# Patient Record
Sex: Male | Born: 1935
Health system: Southern US, Community
[De-identification: ages and names within clinical notes are randomized; demographics above are authoritative.]

## PROBLEM LIST (undated history)

## (undated) DIAGNOSIS — I10 Essential (primary) hypertension: Secondary | ICD-10-CM

## (undated) DIAGNOSIS — I2510 Atherosclerotic heart disease of native coronary artery without angina pectoris: Secondary | ICD-10-CM

## (undated) DIAGNOSIS — I5189 Other ill-defined heart diseases: Secondary | ICD-10-CM

## (undated) DIAGNOSIS — M19041 Primary osteoarthritis, right hand: Secondary | ICD-10-CM

## (undated) DIAGNOSIS — Z87898 Personal history of other specified conditions: Secondary | ICD-10-CM

## (undated) DIAGNOSIS — I495 Sick sinus syndrome: Secondary | ICD-10-CM

## (undated) DIAGNOSIS — Z972 Presence of dental prosthetic device (complete) (partial): Secondary | ICD-10-CM

## (undated) DIAGNOSIS — G589 Mononeuropathy, unspecified: Secondary | ICD-10-CM

## (undated) DIAGNOSIS — E785 Hyperlipidemia, unspecified: Secondary | ICD-10-CM

## (undated) DIAGNOSIS — J189 Pneumonia, unspecified organism: Secondary | ICD-10-CM

## (undated) DIAGNOSIS — R0609 Other forms of dyspnea: Secondary | ICD-10-CM

## (undated) DIAGNOSIS — I200 Unstable angina: Secondary | ICD-10-CM

## (undated) DIAGNOSIS — D649 Anemia, unspecified: Secondary | ICD-10-CM

## (undated) DIAGNOSIS — K219 Gastro-esophageal reflux disease without esophagitis: Secondary | ICD-10-CM

## (undated) DIAGNOSIS — R200 Anesthesia of skin: Secondary | ICD-10-CM

## (undated) DIAGNOSIS — Z79899 Other long term (current) drug therapy: Secondary | ICD-10-CM

## (undated) DIAGNOSIS — N400 Enlarged prostate without lower urinary tract symptoms: Secondary | ICD-10-CM

## (undated) DIAGNOSIS — N471 Phimosis: Secondary | ICD-10-CM

## (undated) DIAGNOSIS — R42 Dizziness and giddiness: Secondary | ICD-10-CM

## (undated) DIAGNOSIS — R202 Paresthesia of skin: Secondary | ICD-10-CM

## (undated) DIAGNOSIS — I700 Atherosclerosis of aorta: Secondary | ICD-10-CM

## (undated) DIAGNOSIS — R0989 Other specified symptoms and signs involving the circulatory and respiratory systems: Secondary | ICD-10-CM

## (undated) DIAGNOSIS — N189 Chronic kidney disease, unspecified: Secondary | ICD-10-CM

## (undated) DIAGNOSIS — M1990 Unspecified osteoarthritis, unspecified site: Secondary | ICD-10-CM

## (undated) DIAGNOSIS — I4891 Unspecified atrial fibrillation: Secondary | ICD-10-CM

## (undated) DIAGNOSIS — I209 Angina pectoris, unspecified: Secondary | ICD-10-CM

## (undated) DIAGNOSIS — N183 Chronic kidney disease, stage 3 unspecified: Secondary | ICD-10-CM

## (undated) HISTORY — DX: Unstable angina: I20.0

## (undated) HISTORY — DX: Personal history of other specified conditions: Z87.898

## (undated) HISTORY — PX: COLONOSCOPY: SHX174

## (undated) HISTORY — DX: Other specified symptoms and signs involving the circulatory and respiratory systems: R06.09

## (undated) HISTORY — PX: TONSILLECTOMY: SUR1361

## (undated) HISTORY — DX: Hyperlipidemia, unspecified: E78.5

## (undated) HISTORY — PX: HERNIA REPAIR: SHX51

## (undated) HISTORY — DX: Dizziness and giddiness: R42

## (undated) HISTORY — PX: CARDIAC CATHETERIZATION: SHX172

## (undated) HISTORY — DX: Atherosclerotic heart disease of native coronary artery without angina pectoris: I25.10

## (undated) HISTORY — DX: Essential (primary) hypertension: I10

## (undated) HISTORY — DX: Anesthesia of skin: R20.2

## (undated) HISTORY — DX: Benign prostatic hyperplasia without lower urinary tract symptoms: N40.0

## (undated) HISTORY — DX: Anesthesia of skin: R20.0

## (undated) HISTORY — DX: Other specified symptoms and signs involving the circulatory and respiratory systems: R09.89

---

## 2002-12-05 ENCOUNTER — Ambulatory Visit (HOSPITAL_COMMUNITY): Admission: RE | Admit: 2002-12-05 | Discharge: 2002-12-05 | Payer: Self-pay | Admitting: Cardiology

## 2002-12-05 HISTORY — PX: LEFT HEART CATH AND CORONARY ANGIOGRAPHY: CATH118249

## 2004-11-24 ENCOUNTER — Ambulatory Visit: Payer: Self-pay | Admitting: Cardiology

## 2005-08-14 ENCOUNTER — Ambulatory Visit: Payer: Self-pay | Admitting: Gastroenterology

## 2005-10-06 ENCOUNTER — Ambulatory Visit: Payer: Self-pay | Admitting: Gastroenterology

## 2006-02-05 ENCOUNTER — Ambulatory Visit: Payer: Self-pay | Admitting: Cardiology

## 2006-10-05 ENCOUNTER — Ambulatory Visit: Payer: Self-pay | Admitting: Cardiology

## 2006-11-01 ENCOUNTER — Ambulatory Visit: Payer: Self-pay

## 2007-08-12 ENCOUNTER — Ambulatory Visit: Payer: Self-pay | Admitting: Cardiology

## 2008-05-13 ENCOUNTER — Inpatient Hospital Stay: Payer: Self-pay | Admitting: Internal Medicine

## 2008-05-13 ENCOUNTER — Ambulatory Visit: Payer: Self-pay | Admitting: Cardiovascular Disease

## 2008-05-15 ENCOUNTER — Inpatient Hospital Stay (HOSPITAL_COMMUNITY): Admission: EM | Admit: 2008-05-15 | Discharge: 2008-05-16 | Payer: Self-pay | Admitting: Cardiovascular Disease

## 2008-05-15 ENCOUNTER — Ambulatory Visit: Payer: Self-pay | Admitting: Cardiovascular Disease

## 2008-05-15 HISTORY — PX: CORONARY ANGIOPLASTY WITH STENT PLACEMENT: SHX49

## 2008-05-23 ENCOUNTER — Ambulatory Visit: Payer: Self-pay | Admitting: Cardiology

## 2008-06-13 ENCOUNTER — Encounter: Payer: Self-pay | Admitting: Cardiology

## 2008-06-21 ENCOUNTER — Ambulatory Visit: Payer: Self-pay | Admitting: Internal Medicine

## 2008-06-21 ENCOUNTER — Inpatient Hospital Stay: Payer: Self-pay | Admitting: Internal Medicine

## 2008-07-04 ENCOUNTER — Ambulatory Visit: Payer: Self-pay | Admitting: Cardiology

## 2008-07-04 ENCOUNTER — Inpatient Hospital Stay: Payer: Self-pay | Admitting: Internal Medicine

## 2008-07-13 ENCOUNTER — Encounter: Payer: PRIVATE HEALTH INSURANCE | Admitting: Cardiology

## 2008-08-13 ENCOUNTER — Encounter: Payer: PRIVATE HEALTH INSURANCE | Admitting: Cardiology

## 2008-09-10 ENCOUNTER — Encounter: Payer: PRIVATE HEALTH INSURANCE | Admitting: Cardiology

## 2008-09-19 ENCOUNTER — Ambulatory Visit: Payer: Self-pay | Admitting: Cardiology

## 2008-10-11 ENCOUNTER — Encounter: Payer: PRIVATE HEALTH INSURANCE | Admitting: Cardiology

## 2008-12-15 DIAGNOSIS — E785 Hyperlipidemia, unspecified: Secondary | ICD-10-CM | POA: Insufficient documentation

## 2008-12-15 DIAGNOSIS — Z87898 Personal history of other specified conditions: Secondary | ICD-10-CM

## 2008-12-21 ENCOUNTER — Ambulatory Visit: Payer: Self-pay | Admitting: Cardiology

## 2008-12-21 DIAGNOSIS — R0989 Other specified symptoms and signs involving the circulatory and respiratory systems: Secondary | ICD-10-CM

## 2008-12-21 DIAGNOSIS — R0609 Other forms of dyspnea: Secondary | ICD-10-CM | POA: Insufficient documentation

## 2008-12-26 ENCOUNTER — Telehealth (INDEPENDENT_AMBULATORY_CARE_PROVIDER_SITE_OTHER): Payer: Self-pay | Admitting: *Deleted

## 2008-12-27 ENCOUNTER — Ambulatory Visit: Payer: Self-pay

## 2008-12-27 ENCOUNTER — Encounter: Payer: Self-pay | Admitting: Cardiology

## 2009-06-19 ENCOUNTER — Telehealth: Payer: Self-pay | Admitting: Cardiology

## 2009-07-03 DIAGNOSIS — I2510 Atherosclerotic heart disease of native coronary artery without angina pectoris: Secondary | ICD-10-CM | POA: Insufficient documentation

## 2009-07-09 ENCOUNTER — Ambulatory Visit: Payer: Self-pay | Admitting: Cardiology

## 2009-10-02 ENCOUNTER — Ambulatory Visit: Payer: Self-pay | Admitting: Cardiology

## 2009-11-04 ENCOUNTER — Telehealth: Payer: Self-pay | Admitting: Cardiology

## 2010-04-08 ENCOUNTER — Ambulatory Visit: Payer: Self-pay | Admitting: Cardiology

## 2010-05-22 ENCOUNTER — Encounter: Payer: Self-pay | Admitting: Cardiology

## 2010-05-22 ENCOUNTER — Telehealth: Payer: Self-pay | Admitting: Cardiology

## 2010-05-28 ENCOUNTER — Ambulatory Visit: Payer: PRIVATE HEALTH INSURANCE | Admitting: Surgery

## 2010-05-29 ENCOUNTER — Ambulatory Visit: Payer: PRIVATE HEALTH INSURANCE | Admitting: Surgery

## 2010-05-30 LAB — PATHOLOGY REPORT

## 2010-06-10 ENCOUNTER — Ambulatory Visit: Payer: Self-pay | Admitting: Cardiovascular Disease

## 2010-07-28 ENCOUNTER — Encounter: Payer: Self-pay | Admitting: Cardiology

## 2010-07-28 ENCOUNTER — Ambulatory Visit
Admission: RE | Admit: 2010-07-28 | Discharge: 2010-07-28 | Payer: Self-pay | Source: Home / Self Care | Attending: Cardiology | Admitting: Cardiology

## 2010-08-12 NOTE — Assessment & Plan Note (Signed)
Summary: f1y/jss  Medications Added PANTOPRAZOLE SODIUM 20 MG TBEC (PANTOPRAZOLE SODIUM) 1 tab once daily      Allergies Added: NKDA  Visit Type:  4 mo f/u Primary Provider:  Dr Randa Lynn  CC:  no cardiac complaints today.  History of Present Illness: [name] comes in today for evaluation and management his coronary disease.  He's having no symptoms of angina or ischemia. He remains extremely active.  His blood work is being followed by Dr. Randa Lynn.  His last objective assessment of his coronary disease was last summer. He had good exercise tolerance with no sign of ischemia.  When he presented, he had symptoms with exertion. We have reviewed those today.  Current Medications (verified): 1)  Lipitor 20 Mg Tabs (Atorvastatin Calcium) .Marland Kitchen.. 1 Tab Once Daily 2)  Niaspan 500 Mg Cr-Tabs (Niacin (Antihyperlipidemic)) .Marland Kitchen.. 1 Tab Once Daily 3)  Uroxatral 10 Mg Xr24h-Tab (Alfuzosin Hcl) .Marland Kitchen.. 1 Tab Once Daily 4)  Diltiazem Hcl Er Beads 180 Mg Xr24h-Cap (Diltiazem Hcl Er Beads) .Marland Kitchen.. 1 Cap Once Daily 5)  Plavix 75 Mg Tabs (Clopidogrel Bisulfate) .Marland Kitchen.. 1 Tab Once Daily 6)  Aspirin 81 Mg Tbec (Aspirin) .... Take One Tablet By Mouth Daily 7)  Pantoprazole Sodium 20 Mg Tbec (Pantoprazole Sodium) .Marland Kitchen.. 1 Tab Once Daily 8)  Metoprolol Succinate 25 Mg Xr24h-Tab (Metoprolol Succinate) .Marland Kitchen.. 1 Tab Once Daily 9)  Nitroglycerin 0.4 Mg Subl (Nitroglycerin) .... One Tablet Under Tongue Every 5 Minutes As Needed For Chest Pain---May Repeat Times Three 10)  Iron Supp .Marland Kitchen.. 1 Tab Once Daily  Allergies (verified): No Known Drug Allergies  Past History:  Past Medical History: Last updated: 07/03/2009 CAD, NATIVE VESSEL (ICD-414.01) DYSPNEA ON EXERTION (ICD-786.09) ANGINA, UNSTABLE (ICD-411.1) DYSLIPIDEMIA (ICD-272.4) BENIGN PROSTATIC HYPERTROPHY, HX OF (ICD-V13.8)    Past Surgical History: Last updated: 12/15/2008 no surgeries  Family History: Last updated: 12/15/2008 Family History of CVA or Stroke:  Mother deceased at 78 stroke Father: deceased at 27 due to accident  Social History: Last updated: 12/15/2008 Retired 12/95 Married  Tobacco Use - No.  Alcohol Use - yes.Marland Kitchenoccasional Regular Exercise - yes Drug Use - no  Risk Factors: Exercise: yes (12/15/2008)  Risk Factors: Smoking Status: never (12/15/2008)  Review of Systems       negative other than history of present illness  Vital Signs:  Patient profile:   77 year old male Height:      72 inches Weight:      197 pounds BMI:     26.81 Pulse rate:   53 / minute Pulse rhythm:   irregular BP sitting:   130 / 80  (left arm) Cuff size:   large  Vitals Entered By: Danielle Rankin, CMA (October 02, 2009 11:45 AM)  Physical Exam  General:  Well developed, well nourished, in no acute distress. Head:  normocephalic and atraumatic Eyes:  PERRLA/EOM intact; conjunctiva and lids normal. Neck:  Neck supple, no JVD. No masses, thyromegaly or abnormal cervical nodes. Chest Jacson Rapaport:  no deformities or breast masses noted Lungs:  Clear bilaterally to auscultation and percussion. Heart:  Non-displaced PMI, chest non-tender; regular rate and rhythm, S1, S2 without murmurs, rubs or gallops. Carotid upstroke normal, no bruit. Normal abdominal aortic size, no bruits. Femorals normal pulses, no bruits. Pedals normal pulses. No edema, no varicosities. Abdomen:  Bowel sounds positive; abdomen soft and non-tender without masses, organomegaly, or hernias noted. No hepatosplenomegaly. Msk:  Back normal, normal gait. Muscle strength and tone normal. Pulses:  pulses normal in all  4 extremities Extremities:  No clubbing or cyanosis. Neurologic:  Alert and oriented x 3. Skin:  Intact without lesions or rashes. Psych:  Normal affect.   EKG  Procedure date:  10/02/2009  Findings:      sinus bradycardia, nonspecific widening of the QRS, no change  Impression & Recommendations:  Problem # 1:  CAD, NATIVE VESSEL (ICD-414.01) Assessment  Unchanged  He is doing remarkably well. I will see him back in 6 months. No change in medication. Symptoms of obstructive coronary disease reviewed. His updated medication list for this problem includes:    Diltiazem Hcl Er Beads 180 Mg Xr24h-cap (Diltiazem hcl er beads) .Marland Kitchen... 1 cap once daily    Plavix 75 Mg Tabs (Clopidogrel bisulfate) .Marland Kitchen... 1 tab once daily    Aspirin 81 Mg Tbec (Aspirin) .Marland Kitchen... Take one tablet by mouth daily    Metoprolol Succinate 25 Mg Xr24h-tab (Metoprolol succinate) .Marland Kitchen... 1 tab once daily    Nitroglycerin 0.4 Mg Subl (Nitroglycerin) ..... One tablet under tongue every 5 minutes as needed for chest pain---may repeat times three  Orders: EKG w/ Interpretation (93000)  Problem # 2:  DYSLIPIDEMIA (ICD-272.4) this That is being followed by his primary care. His updated medication list for this problem includes:    Lipitor 20 Mg Tabs (Atorvastatin calcium) .Marland Kitchen... 1 tab once daily    Niaspan 500 Mg Cr-tabs (Niacin (antihyperlipidemic)) .Marland Kitchen... 1 tab once daily  Patient Instructions: 1)  Your physician recommends that you schedule a follow-up appointment in: 6 MONTHS WITH DR Jaxn Chiquito 2)  Your physician recommends that you continue on your current medications as directed. Please refer to the Current Medication list given to you today.

## 2010-08-12 NOTE — Progress Notes (Signed)
Summary: pt wants to talk to MD about wife   Phone Note Call from Patient Call back at [phone]   Caller: Patient Reason for Call: Talk to Nurse, Talk to Doctor Summary of Call: pt wants to talk to Benjamin Brewer about his wife. wouldn't give anymore information Initial call taken by: [name],  November 04, 2009 11:01 AM  Follow-up for Phone Call        Phone Call Completed PT SEES DR [name] PT'S WIFE  C/O FATIGUE AND HEART BEATING FAST.WOULD LIKE YOU TO REFER OR WOULD YOU SEE WIFE'S NAME [name] Cory DOB[birth year]/20/39. Follow-up by: [name], LPN,  November 04, 2009 2:18 PM  Additional Follow-up for Phone Call Additional follow up Details #1::        SPOKE WITH PT WIFE HAD LABS DONE YESTERDAY WILL SPEAK TO WIFE TO SEE WHAT ROUTE SHE WISHES TO PERSUE  DR [name] STATED HE WOULD SEE HER WILL CLL BACK LATER. Additional Follow-up by: [name], LPN,  November 05, 2009 8:45 AM    Additional Follow-up for Phone Call Additional follow up Details #2::    I am happy to see this week. Follow-up by: Benjamin Shih, MD, [facility],  November 05, 2009 9:52 AM   Appended Document: pt wants to talk to MD about wife SPOKE WITH HUSBAND WIFE WAS SEEN BY PMD YESTERDAY LABS DONE THYROID INFLAMED PER HUSBAND WILL ADDRESS IF CONT TO HAVE PROBLEMS WILL CALL AND SCHEDULE APPT WITH DR [name]./CY

## 2010-08-12 NOTE — Assessment & Plan Note (Signed)
Summary: 6 mo f/u ./cy  Medications Added NITROGLYCERIN 0.4 MG SUBL (NITROGLYCERIN) One tablet under tongue every 5 minutes as needed for chest pain---may repeat times three      Allergies Added: NKDA  Visit Type:  6 mo f/u Primary [name]:  Dr Randa Lynn  CC:  edema/ankles....no other compalaints today.  History of Present Illness: [name] returns today for evaluation and management of his coronary disease. Is no angina or ischemic symptoms. He is very compliant with his medications. Laboratory data followed by his primary care.  Stress Myoview a year ago showed normal left ventricular function with no ischemia or scar. He had good exercise tolerance.  Current Medications (verified): 1)  Lipitor 20 Mg Tabs (Atorvastatin Calcium) .Marland Kitchen.. 1 Tab Once Daily 2)  Niaspan 500 Mg Cr-Tabs (Niacin (Antihyperlipidemic)) .Marland Kitchen.. 1 Tab Once Daily 3)  Uroxatral 10 Mg Xr24h-Tab (Alfuzosin Hcl) .Marland Kitchen.. 1 Tab Once Daily 4)  Diltiazem Hcl Er Beads 180 Mg Xr24h-Cap (Diltiazem Hcl Er Beads) .Marland Kitchen.. 1 Cap Once Daily 5)  Plavix 75 Mg Tabs (Clopidogrel Bisulfate) .Marland Kitchen.. 1 Tab Once Daily 6)  Aspirin 81 Mg Tbec (Aspirin) .... Take One Tablet By Mouth Daily 7)  Pantoprazole Sodium 20 Mg Tbec (Pantoprazole Sodium) .Marland Kitchen.. 1 Tab Once Daily 8)  Metoprolol Succinate 25 Mg Xr24h-Tab (Metoprolol Succinate) .Marland Kitchen.. 1 Tab Once Daily 9)  Nitroglycerin 0.4 Mg Subl (Nitroglycerin) .... One Tablet Under Tongue Every 5 Minutes As Needed For Chest Pain---May Repeat Times Three 10)  Iron Supp .Marland Kitchen.. 1 Tab Once Daily  Allergies (verified): No Known Drug Allergies  Past History:  Past Medical History: Last updated: 07/03/2009 CAD, NATIVE VESSEL (ICD-414.01) DYSPNEA ON EXERTION (ICD-786.09) ANGINA, UNSTABLE (ICD-411.1) DYSLIPIDEMIA (ICD-272.4) BENIGN PROSTATIC HYPERTROPHY, HX OF (ICD-V13.8)    Past Surgical History: Last updated: 12/15/2008 no surgeries  Family History: Last updated: 12/15/2008 Family History of CVA or Stroke:  Mother deceased at 78 stroke Father: deceased at 7 due to accident  Social History: Last updated: 12/15/2008 Retired 12/95 Married  Tobacco Use - No.  Alcohol Use - yes.Marland Kitchenoccasional Regular Exercise - yes Drug Use - no  Risk Factors: Exercise: yes (12/15/2008)  Risk Factors: Smoking Status: never (12/15/2008)  Review of Systems       negative other than history of present illness  Vital Signs:  Patient profile:   77 year old male Height:      72 inches Weight:      195 pounds BMI:     26.54 Pulse rate:   60 / minute Pulse rhythm:   irregular BP sitting:   128 / 80  (left arm) Cuff size:   large  Vitals Entered By: Danielle Rankin, CMA (April 08, 2010 11:35 AM)  Physical Exam  General:  Well developed, well nourished, in no acute distress. Head:  normocephalic and atraumatic Eyes:  PERRLA/EOM intact; conjunctiva and lids normal. Neck:  Neck supple, no JVD. No masses, thyromegaly or abnormal cervical nodes. Chest Wall:  no deformities or breast masses noted Lungs:  Clear bilaterally to auscultation and percussion. Heart:  PMI nondisplaced, regular rate and rhythm, normal S1-S2, no gallop rub or murmur. Carotids equal bilaterally without bruit Abdomen:  Bowel sounds positive; abdomen soft and non-tender without masses, organomegaly, or hernias noted. No hepatosplenomegaly. Msk:  Back normal, normal gait. Muscle strength and tone normal. Pulses:  pulses normal in all 4 extremities Extremities:  No clubbing or cyanosis. Neurologic:  Alert and oriented x 3. Skin:  Intact without lesions or rashes. Psych:  Normal affect.  Impression & Recommendations:  Problem # 1:  CAD, NATIVE VESSEL (ICD-414.01) Assessment Unchanged  His updated medication list for this problem includes:    Diltiazem Hcl Er Beads 180 Mg Xr24h-cap (Diltiazem hcl er beads) .Marland Kitchen... 1 cap once daily    Plavix 75 Mg Tabs (Clopidogrel bisulfate) .Marland Kitchen... 1 tab once daily    Aspirin 81 Mg Tbec (Aspirin)  .Marland Kitchen... Take one tablet by mouth daily    Metoprolol Succinate 25 Mg Xr24h-tab (Metoprolol succinate) .Marland Kitchen... 1 tab once daily    Nitroglycerin 0.4 Mg Subl (Nitroglycerin) ..... One tablet under tongue every 5 minutes as needed for chest pain---may repeat times three  Problem # 2:  DYSLIPIDEMIA (ICD-272.4) followed by primary care His updated medication list for this problem includes:    Lipitor 20 Mg Tabs (Atorvastatin calcium) .Marland Kitchen... 1 tab once daily    Niaspan 500 Mg Cr-tabs (Niacin (antihyperlipidemic)) .Marland Kitchen... 1 tab once daily  Clinical Reports Reviewed:  Cardiac Cath:  05/15/2008: Cardiac Cath Findings:   IMPRESSION:   1. Double-vessel coronary artery disease status post successful       percutaneous coronary intervention with placement of a PROMUS drug-       eluting stent in the proximal right coronary artery and placement       of a PROMUS drug-eluting stent in the mid left anterior descending.   2. Normal left ventricular systolic function.      RECOMMENDATIONS:  I recommend aspirin and Plavix for at least 1 year.   The patient will also be started on a beta-blocker.  I will continue his   statin therapy.  If the patient has problems of bradycardia in the   future, I would may be consider stopping his diltiazem which has been a   part of his medication regimen in the past for episodes of   tachyarrhythmias.  We can most likely manage his tachyarrhythmias with   increased doses of the beta-blocker, which would be appropriate for his   coronary artery disease.  We will admit him to a telemetry bed tonight,   and I will plan on discharge tomorrow if the patient ambulates well and   has no new problems that arise.             [name], MD   Electronically Signed         12/04/2002: Cardiac Cath Findings:   IMPRESSION/RECOMMENDATIONS:  The patient has stable coronary disease  compared with the report of catheterization in 1999.  He has a 60% stenosis  of the left  anterior descending and a 90% stenosis of a 2 mm diagonal  branch.  While the diagonal is amenable to percutaneous therapy, this would  be balloon angioplasty relatively high restenosis rate.  Therefore, favor  continued medical therapy.  Will add Imdur in attempt to further maximize  his exercise tolerance.   The patient has fibromuscular dysplasia of the left renal artery.  Balloon  angioplasty of this lesion for control of his hypertension should be  considered.                                             Salvadore Farber, M.D.  Nuclear Study:  12/27/2008:  Excerise capacity: Good exercise capacity  Blood Pressure response: Hypertensive blood pressure response  Clinical symptoms: No chest pain  ECG impression: No significant ST segment change suggestive  of ischemia  Overall impression: Normal perfusion. No evidence for signifcant ischemia or scar.  Sherrill Raring, MD, [facility]   12/27/2008:  Excerise capacity: Good exercise capacity  Blood Pressure response: Hypertensive blood pressure response  Clinical symptoms: No chest pain  ECG impression: No significant ST segment change suggestive of ischemia  Overall impression: Normal perfusion. No evidence for significant ischemia or scar.  Dietrich Pates, MD, [facility]   11/01/2006:  Excerise capacity: Fair exercise capacity  Blood Pressure response: Hypertensive blood pressure response  Clinical symptoms: No chest pain or dyspnea  ECG impression: Inferolateral J point elevation with poor wave progression. No ischemia  Overall impression: Normal stress nuclear study.  Noralyn Pick. Eden Emms, MD     01/02/2004:  Final Interpretation: Normal stress Cardiolite with no chest pain and no diagnostic electrocardiographic changes. There was a hypertensive response to exercise. The sctigraphic results show mild apical thinning, but there was no ischemia on this study. The gated ejection fraction was 57%, and the wall motion was  normal.  Madolyn Frieze. Jens Som, MD, [facility]  08/06/2003[facility] STRESS ECHOCARDIOGRAM Results: Baseline EKG shows normal sinus rhythm with a right bundle branch block configuration. Treadmill stress test was clinically negative. Electrocardiographically he did develop his usual 2 mm. at least of ST depresion inferiorly and laterally, which promptly resolved postexercise. These are the same ST changes that he has had on prior stress testing. Subsequent echocardiographic readings by D.r Bethann Punches were completely within normal limits, with no evidence of ischemia.  Impression: Normal stress echo as noted above.  Reola Mosher. Randa Lynn, MD, FACP   Patient Instructions: 1)  Your physician recommends that you schedule a follow-up appointment in: 6 months 2)  Your physician recommends that you continue on your current medications as directed. Please refer to the Current Medication list given to you today. Prescriptions: NITROGLYCERIN 0.4 MG SUBL (NITROGLYCERIN) One tablet under tongue every 5 minutes as needed for chest pain---may repeat times three  #25 x 9   Entered by:   Danielle Rankin, CMA   Authorized by:   Gaylord Shih, MD, [facility]   Signed by:   Danielle Rankin, CMA on 04/08/2010   Method used:   Electronically to        [facility]. [address] [phone]* (retail)       [address] [place], [place]  [id number]       Ph: [phone]       Fax: [phone]   RxID:   [id number]

## 2010-08-12 NOTE — Consult Note (Signed)
Summary: [facility] Surgery Consult   [facility] Surgery Consult   Imported By: Roderic Ovens 06/03/2010 14:45:46  _____________________________________________________________________  External Attachment:    Type:   Image     Comment:   External Document  Appended Document: [facility] Surgery Consult   Reviewed Juanito Doom, MD

## 2010-08-12 NOTE — Progress Notes (Signed)
Summary: pt need to stop asa-    Phone Note From Other Clinic   Caller: [facility] [phone]- fax [phone] Request: Talk with Nurse Summary of Call: pt need to stop plavix & asa for right hernia repair. Initial call taken by: Lorne Skeens,  May 22, 2010 3:24 PM  Follow-up for Phone Call        ok, restart as soon as possible. Follow-up by: Gaylord Shih, MD, [facility],  May 22, 2010 5:37 PM

## 2010-08-14 NOTE — Assessment & Plan Note (Signed)
Summary: f1y  Medications Added FERRO-BOB 325 (65 FE) MG TABS (FERROUS SULFATE) 1 tab once daily ICAPS MV  TABS (MULTIPLE VITAMINS-MINERALS) 1 tab once daily      Allergies Added: NKDA  Visit Type:  1 yr f/u Primary Provider:  Dr Randa Lynn  CC:  chest tightness....denies any other complaints today.  History of Present Illness: [name] returns today for evaluation and management of his coronary artery disease.  At 77 years of age, he continues to work out 5 days a week. He is having no symptoms of angina or ischemia. He does complain of some generalized fatigue but no presyncope or syncope.  Medications reviewed. He is quite compliant. Last stress test was unremarkable with excellent exercise tolerance. He has had no further GI bleeding.  Clinical Reports Reviewed:  Cardiac Cath:  05/15/2008: Cardiac Cath Findings:   IMPRESSION:   1. Double-vessel coronary artery disease status post successful       percutaneous coronary intervention with placement of a PROMUS drug-       eluting stent in the proximal right coronary artery and placement       of a PROMUS drug-eluting stent in the mid left anterior descending.   2. Normal left ventricular systolic function.      RECOMMENDATIONS:  I recommend aspirin and Plavix for at least 1 year.   The patient will also be started on a beta-blocker.  I will continue his   statin therapy.  If the patient has problems of bradycardia in the   future, I would may be consider stopping his diltiazem which has been a   part of his medication regimen in the past for episodes of   tachyarrhythmias.  We can most likely manage his tachyarrhythmias with   increased doses of the beta-blocker, which would be appropriate for his   coronary artery disease.  We will admit him to a telemetry bed tonight,   and I will plan on discharge tomorrow if the patient ambulates well and   has no new problems that arise.             [name], MD   Electronically Signed         12/04/2002: Cardiac Cath Findings:   IMPRESSION/RECOMMENDATIONS:  The patient has stable coronary disease  compared with the report of catheterization in 1999.  He has a 60% stenosis  of the left anterior descending and a 90% stenosis of a 2 mm diagonal  branch.  While the diagonal is amenable to percutaneous therapy, this would  be balloon angioplasty relatively high restenosis rate.  Therefore, favor  continued medical therapy.  Will add Imdur in attempt to further maximize  his exercise tolerance.   The patient has fibromuscular dysplasia of the left renal artery.  Balloon  angioplasty of this lesion for control of his hypertension should be  considered.                                             Salvadore Farber, M.D.  Nuclear Study:  12/27/2008:  Excerise capacity: Good exercise capacity  Blood Pressure response: Hypertensive blood pressure response  Clinical symptoms: No chest pain  ECG impression: No significant ST segment change suggestive of ischemia  Overall impression: Normal perfusion. No evidence for signifcant ischemia or scar.  Sherrill Raring, MD, [facility]   12/27/2008:  Excerise capacity:  Good exercise capacity  Blood Pressure response: Hypertensive blood pressure response  Clinical symptoms: No chest pain  ECG impression: No significant ST segment change suggestive of ischemia  Overall impression: Normal perfusion. No evidence for significant ischemia or scar.  Dietrich Pates, MD, [facility]   11/01/2006:  Excerise capacity: Fair exercise capacity  Blood Pressure response: Hypertensive blood pressure response  Clinical symptoms: No chest pain or dyspnea  ECG impression: Inferolateral J point elevation with poor wave progression. No ischemia  Overall impression: Normal stress nuclear study.  Noralyn Pick. Eden Emms, MD     01/02/2004:  Final Interpretation: Normal stress Cardiolite with no chest pain and no diagnostic  electrocardiographic changes. There was a hypertensive response to exercise. The sctigraphic results show mild apical thinning, but there was no ischemia on this study. The gated ejection fraction was 57%, and the Kaybree Williams motion was normal.  Madolyn Frieze. Jens Som, MD, [facility]  08/06/2003[facility] STRESS ECHOCARDIOGRAM Results: Baseline EKG shows normal sinus rhythm with a right bundle branch block configuration. Treadmill stress test was clinically negative. Electrocardiographically he did develop his usual 2 mm. at least of ST depresion inferiorly and laterally, which promptly resolved postexercise. These are the same ST changes that he has had on prior stress testing. Subsequent echocardiographic readings by D.r Bethann Punches were completely within normal limits, with no evidence of ischemia.  Impression: Normal stress echo as noted above.  Reola Mosher. Randa Lynn, MD, FACP   Current Medications (verified): 1)  Lipitor 20 Mg Tabs (Atorvastatin Calcium) .Marland Kitchen.. 1 Tab Once Daily 2)  Niaspan 500 Mg Cr-Tabs (Niacin (Antihyperlipidemic)) .Marland Kitchen.. 1 Tab Once Daily 3)  Uroxatral 10 Mg Xr24h-Tab (Alfuzosin Hcl) .Marland Kitchen.. 1 Tab Once Daily 4)  Diltiazem Hcl Er Beads 180 Mg Xr24h-Cap (Diltiazem Hcl Er Beads) .Marland Kitchen.. 1 Cap Once Daily 5)  Plavix 75 Mg Tabs (Clopidogrel Bisulfate) .Marland Kitchen.. 1 Tab Once Daily 6)  Aspirin 81 Mg Tbec (Aspirin) .... Take One Tablet By Mouth Daily 7)  Pantoprazole Sodium 20 Mg Tbec (Pantoprazole Sodium) .Marland Kitchen.. 1 Tab Once Daily 8)  Metoprolol Succinate 25 Mg Xr24h-Tab (Metoprolol Succinate) .Marland Kitchen.. 1 Tab Once Daily 9)  Nitroglycerin 0.4 Mg Subl (Nitroglycerin) .... One Tablet Under Tongue Every 5 Minutes As Needed For Chest Pain---May Repeat Times Three 10)  Ferro-Bob 325 (65 Fe) Mg Tabs (Ferrous Sulfate) .Marland Kitchen.. 1 Tab Once Daily 11)  Icaps Mv  Tabs (Multiple Vitamins-Minerals) .Marland Kitchen.. 1 Tab Once Daily  Allergies (verified): No Known Drug Allergies  Past History:  Past Medical History: Last updated:  07/03/2009 CAD, NATIVE VESSEL (ICD-414.01) DYSPNEA ON EXERTION (ICD-786.09) ANGINA, UNSTABLE (ICD-411.1) DYSLIPIDEMIA (ICD-272.4) BENIGN PROSTATIC HYPERTROPHY, HX OF (ICD-V13.8)    Past Surgical History: Last updated: 12/15/2008 no surgeries  Family History: Last updated: 12/15/2008 Family History of CVA or Stroke: Mother deceased at 92 stroke Father: deceased at 72 due to accident  Social History: Last updated: 12/15/2008 Retired 12/95 Married  Tobacco Use - No.  Alcohol Use - yes.Marland Kitchenoccasional Regular Exercise - yes Drug Use - no  Risk Factors: Exercise: yes (12/15/2008)  Risk Factors: Smoking Status: never (12/15/2008)  Review of Systems       negative other than history of present illness  Vital Signs:  Patient profile:   77 year old male Height:      72 inches Weight:      193.25 pounds Pulse rate:   53 / minute Pulse rhythm:   irregular BP sitting:   146 / 72  (left arm) Cuff size:   large  Vitals Entered By: Danielle Rankin, CMA (July 28, 2010 3:37 PM)  Physical Exam  General:  Well developed, well nourished, in no acute distress. Head:  normocephalic and atraumatic Eyes:  PERRLA/EOM intact; conjunctiva and lids normal. Neck:  Neck supple, no JVD. No masses, thyromegaly or abnormal cervical nodes. Chest Harman Ferrin:  no deformities or breast masses noted Lungs:  Clear bilaterally to auscultation and percussion. Heart:  PMI nondisplaced, regular rate and rhythm, normal S1-S2 Abdomen:  soft, good bowel sounds, no midline bruit Msk:  Back normal, normal gait. Muscle strength and tone normal. Pulses:  pulses normal in all 4 extremities Extremities:  No clubbing or cyanosis. Neurologic:  Alert and oriented x 3. Skin:  Intact without lesions or rashes. Psych:  Normal affect.   Impression & Recommendations:  Problem # 1:  CAD, NATIVE VESSEL (ICD-414.01) Assessment Unchanged  His updated medication list for this problem includes:    Diltiazem Hcl Er Beads  180 Mg Xr24h-cap (Diltiazem hcl er beads) .Marland Kitchen... 1 cap once daily    Plavix 75 Mg Tabs (Clopidogrel bisulfate) .Marland Kitchen... 1 tab once daily    Aspirin 81 Mg Tbec (Aspirin) .Marland Kitchen... Take one tablet by mouth daily    Metoprolol Succinate 25 Mg Xr24h-tab (Metoprolol succinate) .Marland Kitchen... 1 tab once daily    Nitroglycerin 0.4 Mg Subl (Nitroglycerin) ..... One tablet under tongue every 5 minutes as needed for chest pain---may repeat times three  His updated medication list for this problem includes:    Diltiazem Hcl Er Beads 180 Mg Xr24h-cap (Diltiazem hcl er beads) .Marland Kitchen... 1 cap once daily    Plavix 75 Mg Tabs (Clopidogrel bisulfate) .Marland Kitchen... 1 tab once daily    Aspirin 81 Mg Tbec (Aspirin) .Marland Kitchen... Take one tablet by mouth daily    Metoprolol Succinate 25 Mg Xr24h-tab (Metoprolol succinate) .Marland Kitchen... 1 tab once daily    Nitroglycerin 0.4 Mg Subl (Nitroglycerin) ..... One tablet under tongue every 5 minutes as needed for chest pain---may repeat times three  Problem # 2:  DYSPNEA ON EXERTION (ICD-786.09) Assessment: Unchanged  His updated medication list for this problem includes:    Diltiazem Hcl Er Beads 180 Mg Xr24h-cap (Diltiazem hcl er beads) .Marland Kitchen... 1 cap once daily    Aspirin 81 Mg Tbec (Aspirin) .Marland Kitchen... Take one tablet by mouth daily    Metoprolol Succinate 25 Mg Xr24h-tab (Metoprolol succinate) .Marland Kitchen... 1 tab once daily  His updated medication list for this problem includes:    Diltiazem Hcl Er Beads 180 Mg Xr24h-cap (Diltiazem hcl er beads) .Marland Kitchen... 1 cap once daily    Aspirin 81 Mg Tbec (Aspirin) .Marland Kitchen... Take one tablet by mouth daily    Metoprolol Succinate 25 Mg Xr24h-tab (Metoprolol succinate) .Marland Kitchen... 1 tab once daily  Patient Instructions: 1)  Your physician recommends that you schedule a follow-up appointment in: 6 months 2)  Your physician recommends that you continue on your current medications as directed. Please refer to the Current Medication list given to you today.

## 2010-11-25 NOTE — Assessment & Plan Note (Signed)
[facility]                            CARDIOLOGY OFFICE NOTE   Kennett, Symes Benjamin Brewer                     MRN:          [id number]  DATE:08/12/2007                            DOB:          [birth year]    Benjamin Brewer returns today for further management of the following issues.  1. Coronary artery disease.  He is having exertional angina which he      says gotten worse.  He usually reports this as he did in October 05, 2006.  He has not had to take nitroglycerin.  He seems to have more      during cold weather.  We did a stress Myoview November 01, 2006      because of the same complaints.  He exercised for 8 minutes.  He      had a EF of 65% with no ischemia.  He has small diagonal lesion is      not amenable to PCI.  He has about an 80% percent lesion there.  2. Hypertension under good control.  He has fibromuscular place a left      renal artery.  Is followed by Dr. Fidela Juneau  3. Hyperlipidemia followed by Dr. Randa Lynn.  4. History of tachy palpitations currently stable.   He denies orthopnea, PND or peripheral edema.  He had no presyncope or  syncope.  Has had a dry cough.  He is denies any fever, chills or  hemoptysis.  He is not a smoker.   He is currently on Diltiazem extended release 120 daily, Lipitor 20 mg a  day, aspirin 325 mg a day, Niaspan 500 mg a day, Uroxatral 10 mg daily,  multivitamin daily.  He carries nitroglycerin which he says needs to be  renewed.   Blood pressure 148/83 is pulse 64 and regular, weight 204.  HEENT:  Normocephalic, atraumatic.  PERRL.  Extraocular is intact.  Sclerae are clear.  Face symmetry is normal.  Dentition satisfactory.  Alert and oriented x3.  Skin is warm and dry.  Neck is supple.  Carotids were bilateral bruits, no JVD.  Thyroid is not  enlarged.  Trachea is midline.  LUNGS:  Clear.  HEART:  Reveals a nondisplaced PMI normal S1-S2.  No gallop.  ABDOMEN:  Soft, good bowel sounds.  No midline bruit.  EXTREMITIES:  No cyanosis, clubbing or edema.  Pulses intact.  No sign  of DVT.  NEURO:  Exam is intact.   EKG shows sinus rhythm with a mild left axis deviation.  He has RSR  prime V1-V2 which is old.  His EKG is stable.   I obtained a chest x-ray because of the dry cough.  Preliminary report  by my view is negative except for some degenerative spine disease in the  thoracic spine.  Will have official read by radiology.   ASSESSMENT/PLAN:  Benjamin Brewer continues to have exertional angina.  This is mostly with cold weather.  I have asked him to warm up for goes  out into the cold and also where  appropriate clubbing.  Renew sublingual nitroglycerin.  I increased his  Diltiazem extended release from 120 to 180 q. a.m..  Assuming he is  stable I will see him back in a year.     Thomas C. Daleen Squibb, MD, [facility]  Electronically Signed    TCW/MedQ  DD: 08/12/2007  DT: 08/12/2007  Job #: [id number]   cc:   Dr. Alonna Buckler

## 2010-11-25 NOTE — Assessment & Plan Note (Signed)
[name]                            CARDIOLOGY OFFICE NOTE   Alcus, Benjamin Brewer                     MRN:          [id number]  DATE:09/19/2008                            DOB:          Sep 08, 1930    Benjamin Brewer comes in today for followup.  He is having no angina.  He  does have some questions about his strength returning after having  significant GI bleed in December.  He has had only some bright red  blood, apparently had gastric erosions.  He remains on Plavix and  aspirin.  His last hemoglobin was 11 which is part of the reason he is  probably tired.   Please see my previous notes for his stents of the proximal right and  mid LAD.  These were drug-eluting stents.  This was done in November.   His meds are unchanged since his last visit except he is not on  multivitamin and his metoprolol is now metoprolol succinate 25 mg a day.  He has not had to use nitroglycerin.   PHYSICAL EXAMINATION:  VITAL SIGNS:  His blood pressure is 118/72, his  pulse is 52 and regular.  Weight is 188, down 10.  HEENT:  Normal.  NECK:  Supple.  Carotids are full without bruits.  Thyroid is not  enlarged.  CHEST:  Lungs are clear to auscultation and percussion.  HEART:  A soft S1 and S2.  PMI is nondisplaced.  ABDOMEN:  Soft.  No midline bruit.  No pulsatile mass.  EXTREMITIES:  No cyanosis, clubbing, or edema.  Pulses are brisk.  NEUROLOGIC:  Intact.  SKIN:  No ecchymoses.   Benjamin Brewer is doing well.  I have asked him to continue with his current  medical program.  He wishes to start his multivitamin back which is  inconsequential.  I will plan on seeing him back again in 3 months.     Thomas C. Daleen Squibb, MD, [facility]  Electronically Signed    TCW/MedQ  DD: 09/19/2008  DT: 09/19/2008  Job #: [id number]

## 2010-11-25 NOTE — Discharge Summary (Signed)
NAME:  Benjamin Brewer, Benjamin Brewer NO.:  [id number]   MEDICAL RECORD NO.:  [id number]          PATIENT TYPE:  INP   LOCATION:  6527                         FACILITY:  [facility]   PHYSICIAN:  [name], MDDATE OF BIRTH:  02/27/1931   DATE OF ADMISSION:  05/15/2008  DATE OF DISCHARGE:  05/16/2008                               DISCHARGE SUMMARY   DISCHARGING PHYSICIAN:  [name], MD   PRIMARY CARDIOLOGIST:  Jesse Sans. Wall, MD, [facility]   The patient is also cared for by Dr. Alonna Buckler and Dr. Fredia Sorrow.   DISCHARGING DIAGNOSES:  1. Unstable angina/coronary artery disease/cardiac catheterization      with percutaneous coronary intervention.  The patient with double-      vessel coronary artery disease status post successful percutaneous      coronary intervention to the proximal right coronary artery and mid      left anterior descending with normal left ventricular function.      The patient is a participant in assessment of dual antiplatelet      therapy drug-eluting stent study.  2. Dyslipidemia.  3. Benign prostatic hypertrophy.  4. Known coronary artery disease.   HOSPITAL COURSE:  A 77 year old Caucasian gentleman with known history  of coronary artery disease and other risk factors as stated above, who  had episode of chest discomfort with physical exertion associated with  left-sided radiation into the left neck and jaw.  He presented to  outside facility for above symptoms, was treated with Lovenox and  nitroglycerin, had complete resolution of his pain.  Troponin found to  be within normal limits.  Creatinine 1.2.  Lovenox and aspirin were  continued and a low-dose beta blocker.  Transferred to [facility] for  diagnostic heart catheterization.  The patient to the cath lab on  May 15, 2008, for intervention as stated above.  The patient  tolerated procedure without complications, ambulating on post procedure  day 1.  Blood pressure 137/68,  sating 98% on room air, afebrile,  tolerating medications without problems.  A beta blocker started at low  dose.  Heart rate in the 50-60.  The patient is asymptomatic and will  continue.   AT THE TIME OF DISCHARGE LABORATORY WORK:  Potassium 3.8, BUN and  creatinine 9 and 1.26.  H&H 13 and 37.7, platelet count 223.   MEDICATIONS AT THE TIME OF DISCHARGE:  1. Aspirin 325 mg.  2. Plavix 75 mg.  3. Lipitor has been increased to 40 mg daily.  4. Niaspan 500 nightly.  5. Uroxatral and multivitamin as previously prescribed.  6. Lopressor 25 mg half a tablet b.i.d.  7. Diltiazem ER 120 mg daily.  8. Nitroglycerin as needed.   I provided the patient with a prescription for the Plavix and Lipitor as  his dose is increased, the Lopressor and nitroglycerin.  He has been  given the post cardiac catheterization discharge instructions with  instructions regarding complications and when to call the office.  I  have scheduled him a followup appointment with Dr. Juanito Doom at our  [facility] on May 29, 2008, at  11:00 a.m.   DURATION OF DISCHARGE ENCOUNTER:  Over 30 minutes.      Dorian Pod, ACNP      [name], MD  Electronically Signed    MB/MEDQ  D:  05/16/2008  T:  05/16/2008  Job:  [id number]   cc:   Dr. Alonna Buckler  Dr. Fredia Sorrow

## 2010-11-25 NOTE — Assessment & Plan Note (Signed)
[facility]                            CARDIOLOGY OFFICE NOTE   Benjamin Brewer, Benjamin Brewer                     MRN:          [id number]  DATE:05/23/2008                            DOB:          1930/11/20    Benjamin Brewer comes in today for followup.   He was admitted with unstable angina at [facility].  He was transferred over to [facility], where he underwent cardiac  catheterization, had successful PCI of a proximal right coronary artery  and the mid LAD with drug-eluting stents.   Since discharge, he has had some tingling in his arms when he goes to  sleep at night.  Otherwise, he has been asymptomatic.   He has had no symptoms consistent what he had on presentation.   CURRENT MEDICATIONS:  1. Aspirin 325 mg a day.  2. Plavix 75 mg a day.  3. Lipitor up to 20 mg a day.  4. Niaspan 500 mg at night.  5. Uroxatral.  6. Multivitamin.  7. Lopressor 25 mg half tablet b.i.d.  8. Diltiazem extended release 120 mg a day.  9. Nitroglycerin as needed.   PHYSICAL EXAMINATION:  VITAL SIGNS:  His blood pressure is 151/72; his  pulse is 60 and regular; and his weight is 198, down 6.  HEENT:  Normal.  NECK:  Carotid upstrokes are equal bilaterally without bruits, no JVD.  Thyroid is not enlarged.  Trachea is midline.  LUNGS:  Clear to auscultation and percussion.  HEART:  Regular rate and rhythm.  No murmur, rub, or gallop.  ABDOMEN:  Soft, good bowel sounds.  No midline bruits.  EXTREMITIES:  No cyanosis, clubbing, or edema.  Pulses are intact.  NEURO:  Intact.   EKG is normal, except for a nonspecific intraventricular conduction  delay which is old.   Benjamin Brewer is stable from our standpoint.  I have made no changes in his  medical program.  He will need to follow up lipids with Dr. Randa Lynn in  about 6 weeks on a higher dose of Lipitor.  We will plan on seeing him  back again in 4 months.     Thomas C. Daleen Squibb, MD, [facility]  Electronically  Signed    TCW/MedQ  DD: 05/23/2008  DT: 05/23/2008  Job #: [id number]   cc:   Reola Mosher. Randa Lynn

## 2010-11-25 NOTE — Cardiovascular Report (Signed)
NAME:  Benjamin Brewer, Benjamin Brewer NO.:  [id number]   MEDICAL RECORD NO.:  [id number]          PATIENT TYPE:  INP   LOCATION:  6527                         FACILITY:  [facility]   PHYSICIAN:  [name], MDDATE OF BIRTH:  1931/05/25   DATE OF PROCEDURE:  05/15/2008  DATE OF DISCHARGE:  05/16/2008                            CARDIAC CATHETERIZATION   INDICATIONS:  Unstable angina in a patient with known coronary artery  disease.   OPERATOR:  [name], MD   PROCEDURES PERFORMED:  1. Left heart catheterization  2. Selective coronary angiography.  3. Left ventricular angiogram.  4. Percutaneous coronary intervention with placement of a PROMUS drug-      eluting stent in the proximal right coronary artery.  5. Percutaneous coronary intervention with placement of a PROMUS drug-      eluting stent in the mid left anterior descending.  6. Placement of an Angio-Seal femoral artery closure device.   DETAILS OF PROCEDURE:  The patient was transferred from [facility] directly to the Cath Lab for the planned  diagnostic left heart catheterization.  The patient had requested  transfer for this procedure.  The patient signed informed consent for  the procedure.  The right groin was prepped and draped in a sterile  fashion.  1% lidocaine was used for local anesthesia.  The patient was  given 2 mg of Versed and 25 mcg of fentanyl for sedation.  A 5-French  sheath was then placed into the right femoral artery.  A 5-French JL-4  diagnostic catheter was used to selectively engage the left main  coronary artery.  Selective angiography was then performed of the left  coronary system.  A JR-4 5-French catheter was used to selectively  engage the right coronary artery.  Selective angiography of the right  coronary artery was then performed.  A 5-French pigtail catheter was  used to cross the aortic valve into the left ventricle.  Following the  performance of a left ventricular angiogram, the catheter was pullback  across the aortic valve with no significant pressure gradient measured.   At this point, I elected to proceed to percutaneous coronary  intervention of a 99% stenosis in the proximal right coronary artery.  My 5-French sheath was changed out to a 6-French sheath in the right  femoral artery.  A 6-French JR-4 guiding catheter was used to engage the  right coronary artery.  A Cougar intracoronary wire was then passed down  the length of the right coronary artery with no difficulty.  A 2.5 x 12  mm balloon was used for predilatation of the lesion.  A 2.75 x 15 mm  PROMUS drug-eluting stent was then placed in the proximal right coronary  artery.  A 3.0 x 12 mm noncompliant balloon was used for post-dilatation  of the lesion.  The pre-stenosis of the lesion was 99% and following the  intervention was 0%.   I then turned my attention to the lesion in the mid LAD that was  approximately 80%.  An XB LAD 3.5 guiding catheter was used to  selectively engage the left  coronary system.  A Cougar intracoronary  wire was passed down the LAD to the apex.  A 2.5 x 15 mm balloon was  used for pre-dilatation of the lesion.  A 3.5 x 18 mm PROMUS drug-  eluting stent was then placed in the mid LAD.  A 3.75 x 15 mm  noncompliant balloon was used for post-dilatation of the lesion.  The  pre-stenosis of the lesion was 80% and following the intervention was  0%.  The patient tolerated the procedure well.  I performed a selective  injection of the right femoral artery, it felt that it was an  appropriate location for an Angio-Seal femoral artery closure device.  The femoral artery closure device was placed without difficulty.  The  patient was taken to the holding area in stable condition.   ANGIOGRAPHIC FINDINGS:  1. The left main coronary artery had no evidence of disease and      bifurcates into the circumflex and LAD.  2. The left  anterior descending had plaque disease noted in the      proximal portion as well as the distal portion.  There was an 80%      stenosis in the midportion of this vessel just after the take off      of a first diagonal branch.  The first diagonal branch was a small-      to-moderate-sized vessel that had a 95% stenosis.  This did not      appear to be significantly change since the heart catheterization 5-      1/2 years ago.  3. The circumflex artery had plaque disease noted in the proximal      portion.  This gave rise to a large obtuse marginal branch that was      free of disease.  4. The right coronary artery was a codominant vessel that had a 99%      proximal stenosis.  There was plaque disease noted in the distal      right coronary artery.  5. Left ventricular angiogram showed normal systolic function with no      wall motion abnormalities.  Ejection fraction was estimated at 50-      55%.   HEMODYNAMIC DATA:  Central aortic pressure 149/78, left ventricular  pressure 146/1, end-diastolic pressure 7.   IMPRESSION:  1. Double-vessel coronary artery disease status post successful      percutaneous coronary intervention with placement of a PROMUS drug-      eluting stent in the proximal right coronary artery and placement      of a PROMUS drug-eluting stent in the mid left anterior descending.  2. Normal left ventricular systolic function.   RECOMMENDATIONS:  I recommend aspirin and Plavix for at least 1 year.  The patient will also be started on a beta-blocker.  I will continue his  statin therapy.  If the patient has problems of bradycardia in the  future, I would may be consider stopping his diltiazem which has been a  part of his medication regimen in the past for episodes of  tachyarrhythmias.  We can most likely manage his tachyarrhythmias with  increased doses of the beta-blocker, which would be appropriate for his  coronary artery disease.  We will admit him to a  telemetry bed tonight,  and I will plan on discharge tomorrow if the patient ambulates well and  has no new problems that arise.      [name], MD  Electronically Signed  CM/MEDQ  D:  05/16/2008  T:  05/16/2008  Job:  [id number]   cc:   Dr. Bronwen Betters. Wall, MD, [facility]

## 2010-11-28 NOTE — Cardiovascular Report (Signed)
NAME:  Benjamin Brewer, Benjamin Brewer                        ACCOUNT NO.:  [id number]   MEDICAL RECORD NO.:  [id number]                   PATIENT TYPE:  OIB   LOCATION:  2866                                 FACILITY:  [facility]   PHYSICIAN:  Salvadore Farber, M.D.             DATE OF BIRTH:  1930-10-28   DATE OF PROCEDURE:  12/05/2002  DATE OF DISCHARGE:                              CARDIAC CATHETERIZATION   PROCEDURE:  Left heart catheterization, left ventriculography, coronary  angiography, abdominal aortography without runoff.   INDICATIONS:  The patient is a 76 year old gentleman who presents with  stable exertional angina.   PROCEDURAL TECHNIQUE:  Informed consent was obtained.  Under 1% lidocaine  local anesthesia a 6-French sheath was placed in the right femoral artery  using the modified Seldinger technique.  Diagnostic angiography and  ventriculography were performed using JL5, JR4, and pigtail catheters.  The  pigtail catheter was then pulled back to the abdominal aorta and abdominal  aortography performed.  The patient tolerated the procedure well.  Was  transferred to the holding room in stable condition.   COMPLICATIONS:  None.   FINDINGS:  1. LV 149/4/24 after the administration of contrast.  EF 65% without     regional wall motion abnormality.  2. No aortic stenosis.  Trace mitral regurgitation.  3. Left main:  Angiographically normal.  4. LAD:  The LAD is a large vessel which wraps around the apex of the heart     and gives rise to a single diagonal branch.  This diagonal is moderately     large and has a proximal 90% stenosis approximately 15 mm in length.  The     diagonal is a 2 mm diameter vessel.  5. Circumflex:  This circumflex is a codominant vessel.  It gives rise to a     single obtuse marginal and the PDA.  There is a 30% proximal stenosis of     the circumflex and a 30% stenosis of the mid portion of the AV groove     circumflex.  The first obtuse marginal has a 20%  proximal lesion.  6. RCA:  The RCA is a small, but codominant vessel.  There are serial 40 and     30% stenoses of the proximal and mid vessel.  7. Single renal arteries bilaterally.  There is fibromuscular dysplasia in     the left renal artery.   IMPRESSION/RECOMMENDATIONS:  The patient has stable coronary disease  compared with the report of catheterization in 1999.  He has a 60% stenosis  of the left anterior descending and a 90% stenosis of a 2 mm diagonal  branch.  While the diagonal is amenable to percutaneous therapy, this would  be balloon angioplasty relatively high restenosis rate.  Therefore, favor  continued medical therapy.  Will add Imdur in attempt to further maximize  his exercise tolerance.   The patient has fibromuscular dysplasia of  the left renal artery.  Balloon  angioplasty of this lesion for control of his hypertension should be  considered.                                               Salvadore Farber, M.D.    WED/MEDQ  D:  12/05/2002  T:  12/05/2002  Job:  [id number]   cc:   Thomas C. Wall, M.D.   Alonna Buckler, M.D.

## 2010-11-28 NOTE — Assessment & Plan Note (Signed)
[facility]                            CARDIOLOGY OFFICE NOTE   Benjamin Brewer, Benjamin Brewer                     MRN:          [id number]  DATE:10/05/2006                            DOB:          Dec 21, 1930    Benjamin Brewer returns today for further management of the following issues:  1. Coronary artery disease.  He has a history of exertional angina      which has gotten worse over the last several months.  He has been      less active in the winter and has gained about 10 pounds.  It      usually goes away with rest.  He occasionally has to take a      nitroglycerin.  2. Hypertension.  This has been under good control, is followed by Dr.      Randa Brewer.  He has a fibromuscular dysplasia of the left renal artery.  3. Hyperlipidemia, followed by Dr. Randa Brewer.  4. History of tachy palpitations, currently stable.   MEDICATIONS:  1. Uroxatral 10 mg a day.  2. Niaspan 500 mg a day.  3. Aspirin 325 a day.  4. Lipitor 20 mg a day.  5. Diltiazem extended-release 120 a day.   His blood pressure is 140/79, his pulse is 66 and regular.  His weight  is 213, up 12.  HEENT:  Normocephalic/atraumatic, PERRLA, extraocular movements intact,  sclerae are clear.  Carotid upstroke were equal bilaterally without  bruits, there is no JVD, thyroid is not enlarged, trachea is midline.  LUNGS:  Clear to auscultation and percussion.  HEART:  Reveals a regular rate and rhythm without murmur, rub, or  gallop.  ABDOMINAL:  Slightly protuberant, good bowel sounds, there is no midline  bruit, there is no hepatomegaly.  EXTREMITIES:  Reveal no cyanosis, clubbing, or edema.  Pulses are  intact.  NEURO:  Intact.  SKIN:  Intact.   EKG shows sinus rhythm with no change from before.   ASSESSMENT/PLAN:  Benjamin Brewer is having more exertional angina.  We know  he has a small anterolateral that has an 80% blockage.  This is not  something we can intervene upon.  His last stress Myoview was January 02, 2004 which showed an ejection fraction of 57% with no ischemia.   I have recommended we repeat this study.  If he has any new ischemia in  a different territory we may need to repeat his cardiac catheterization.  Otherwise we are going to continue with his current medications.     Benjamin C. Daleen Squibb, MD, [facility]  Electronically Signed    TCW/MedQ  DD: 10/05/2006  DT: 10/05/2006  Job #: [id number]   cc:   Benjamin Buckler, MD

## 2010-11-28 NOTE — Assessment & Plan Note (Signed)
[facility]                              CARDIOLOGY OFFICE NOTE   Jkai, Arwood MAHMOOD BOEHRINGER                     MRN:          [id number]  DATE:02/05/2006                            DOB:          1931-04-08    Mr. Vanaman returns today for further management of the following problems:  1.  Coronary artery disease.  He had some increased angina this May and      underwent a stress nuclear study in [place].  Apparently, it was      stable.  I have asked him to retrieve a copy for [place].  He has normal left      ventricular systolic function.  His angina is now settled back down.  2.  Hypertension.  He has a history of fiber muscular dysplasia of the left      renal artery.  His blood pressures have been good and he brings in a      list of those today to confirm that.  3.  Hyperlipidemia, followed by Dr. Randa Lynn.  4.  History of tachy palpitations currently stable.   He is working very hard.  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   MEDICATIONS:  1.  Diltiazem extended release 120 a day.  2.  Lipitor 20 mg a day.  3.  Niaspan 1,000 mg a day.  4.  Aspirin 325 a day.   PHYSICAL EXAMINATION:  VITAL SIGNS:  His blood pressure is 148/82.  His  pulse is 62 and regular.  His weight is 201.  SKIN:  Warm and dry.  HEENT:  Unremarkable.  Carotid upstrokes are equal bilaterally without  bruits.  No JVD.  Thyroid is not enlarged.  Trachea is midline.  LUNGS:  Clear.  HEART:  Reveals a regular rate and rhythm without murmur, rub, or gallop.  ABDOMEN:  Soft with good bowel sounds.  There is no midline or flank bruits.  There is no hepatomegaly.  EXTREMITIES:  Reveal no cyanosis, clubbing, or edema.  Pulses are intact.   His electrocardiogram shows sinus rhythm with no changes from before.   Mr. Rajewski is doing well.  I have asked him to get [place] a copy of his nuclear  stress study.  Assuming this is stable, we will see him back in a  year.                               Thomas C. Daleen Squibb, MD, [facility]    TCW/MedQ  DD:  02/05/2006  DT:  02/05/2006  Job #:  [id number]   cc:   Fidela Juneau, Dr.

## 2010-12-03 ENCOUNTER — Telehealth: Payer: Self-pay | Admitting: Cardiology

## 2010-12-03 NOTE — Telephone Encounter (Signed)
LMOVM okay to take generic plavix.

## 2010-12-03 NOTE — Telephone Encounter (Signed)
Pt got generic for Plavix and he wanted to make sure this is ok with Dr. Daleen Squibb.  Please let him know if this is ok for him to take,

## 2010-12-17 ENCOUNTER — Encounter: Payer: Self-pay | Admitting: Cardiology

## 2011-01-19 ENCOUNTER — Ambulatory Visit (INDEPENDENT_AMBULATORY_CARE_PROVIDER_SITE_OTHER): Payer: Medicare Other | Admitting: Cardiology

## 2011-01-19 ENCOUNTER — Encounter: Payer: Self-pay | Admitting: Cardiology

## 2011-01-19 VITALS — BP 130/72 | HR 58 | Resp 14 | Ht 72.0 in | Wt 195.0 lb

## 2011-01-19 DIAGNOSIS — I2510 Atherosclerotic heart disease of native coronary artery without angina pectoris: Secondary | ICD-10-CM

## 2011-01-19 NOTE — Patient Instructions (Signed)
Your physician recommends that you schedule a follow-up appointment in: 1 year with Dr. Wall  

## 2011-01-19 NOTE — Assessment & Plan Note (Signed)
Mr. Pollitt is doing well. No change in medical therapy. Followup in a year.

## 2011-01-19 NOTE — Progress Notes (Signed)
HPI [name] comes in today for followup and evaluation management of his coronary artery disease. He is having no angina or ischemic symptoms. He remains very active.  His laboratory data is being followed by Dr. Randa Lynn. I do not have a copy but the patient tells me he's a goal.  His EKG today shows sinus bradycardia with low voltage no changes. Past Medical History  Diagnosis Date  . CAD (coronary artery disease)     native vessel  . Other dyspnea and respiratory abnormality     on exertion  . Intermediate coronary syndrome     angina, unstable  . Dyslipidemia   . Benign prostatic hypertrophy     Hx of it    No past surgical history on file.  No family history on file.  History   Social History  . Marital Status: Married    Spouse Name: N/A    Number of Children: N/A  . Years of Education: N/A   Occupational History  . Not on file.   Social History Main Topics  . Smoking status: Never Smoker   . Smokeless tobacco: Not on file  . Alcohol Use: Yes     occasional   . Drug Use: No  . Sexually Active: Not on file   Other Topics Concern  . Not on file   Social History Narrative   Married, retired 12/95. Gets regular exercise.     No Known Allergies  Current Outpatient Prescriptions  Medication Sig Dispense Refill  . alfuzosin (UROXATRAL) 10 MG 24 hr tablet Take 10 mg by mouth daily.        Marland Kitchen aspirin 81 MG EC tablet Take 81 mg by mouth daily.        Marland Kitchen atorvastatin (LIPITOR) 20 MG tablet Take 20 mg by mouth daily.        . clopidogrel (PLAVIX) 75 MG tablet Take 75 mg by mouth daily.        Marland Kitchen diltiazem (CARDIZEM CD) 180 MG 24 hr capsule Take 180 mg by mouth daily.        . ferrous sulfate (FERRO-BOB) 325 (65 FE) MG tablet Take 325 mg by mouth daily.        . metoprolol succinate (TOPROL-XL) 25 MG 24 hr tablet 1/2 tab po qd      . Multiple Vitamins-Minerals (ICAPS MV) TABS Take 1 tablet by mouth daily.        . niacin (NIASPAN) 500 MG CR tablet Take 500 mg by  mouth daily.        . nitroGLYCERIN (NITROSTAT) 0.4 MG SL tablet Place 0.4 mg under the tongue every 5 (five) minutes as needed.        . pantoprazole (PROTONIX) 20 MG tablet Take 20 mg by mouth 2 (two) times daily.       Marland Kitchen DISCONTD: alfuzosin (UROXATRAL) 10 MG 24 hr tablet Take 10 mg by mouth daily.          ROS Negative other than HPI.   PE General Appearance: well developed, well nourished in no acute distress HEENT: symmetrical face, PERRLA, good dentition  Neck: no JVD, thyromegaly, or adenopathy, trachea midline Chest: symmetric without deformity Cardiac: PMI non-displaced, RRR, normal S1, S2, no gallop or murmur Lung: clear to ausculation and percussion Vascular: all pulses full without bruits  Abdominal: nondistended, nontender, good bowel sounds, no HSM, no bruits Extremities: no cyanosis, clubbing or edema, no sign of DVT, no varicosities  Skin: normal color, no rashes Neuro:  alert and oriented x 3, non-focal Pysch: normal affect Filed Vitals:   01/19/11 0933  BP: 130/72  Pulse: 58  Resp: 14  Height: 6' (1.829 m)  Weight: 195 lb (88.451 kg)    EKG  Labs and Studies Reviewed.   No results found for this basename: WBC, HGB, HCT, MCV, PLT      Chemistry   No results found for this basename: NA, K, CL, CO2, BUN, CREATININE, GLU   No results found for this basename: CALCIUM, ALKPHOS, AST, ALT, BILITOT       No results found for this basename: CHOL   No results found for this basename: HDL   No results found for this basename: LDLCALC   No results found for this basename: TRIG   No results found for this basename: CHOLHDL   No results found for this basename: HGBA1C   No results found for this basename: ALT, AST, GGT, ALKPHOS, BILITOT   No results found for this basename: TSH

## 2011-04-14 LAB — CBC
HCT: 37.7 — ABNORMAL LOW
Hemoglobin: 13
Platelets: 223
WBC: 8.4

## 2011-04-14 LAB — BASIC METABOLIC PANEL
GFR calc non Af Amer: 56 — ABNORMAL LOW
Potassium: 3.8
Sodium: 141

## 2011-05-19 ENCOUNTER — Observation Stay: Payer: PRIVATE HEALTH INSURANCE | Admitting: Internal Medicine

## 2012-03-03 ENCOUNTER — Telehealth: Payer: Self-pay | Admitting: *Deleted

## 2012-03-03 NOTE — Telephone Encounter (Signed)
Dr. Elwyn Lade office calls this morning for clearance to stop plavix and aspirin prior to removal of 3 teeth under "light sedation". Dental extraction has not yet been scheduled. Aware Dr. Daleen Squibb is out of the office today. They will fax clearance form.   Pt has yearly appt on 03/16/12 with Dr. Daleen Squibb. Will forward to Dr. Daleen Squibb. Mylo Red RN

## 2012-03-11 ENCOUNTER — Encounter: Payer: Self-pay | Admitting: *Deleted

## 2012-03-14 ENCOUNTER — Other Ambulatory Visit: Payer: Self-pay | Admitting: Cardiology

## 2012-03-16 ENCOUNTER — Encounter: Payer: Self-pay | Admitting: Cardiology

## 2012-03-16 ENCOUNTER — Ambulatory Visit (INDEPENDENT_AMBULATORY_CARE_PROVIDER_SITE_OTHER): Payer: Medicare Other | Admitting: Cardiology

## 2012-03-16 VITALS — BP 132/60 | HR 75 | Ht 72.0 in | Wt 197.0 lb

## 2012-03-16 DIAGNOSIS — R0609 Other forms of dyspnea: Secondary | ICD-10-CM

## 2012-03-16 DIAGNOSIS — I2510 Atherosclerotic heart disease of native coronary artery without angina pectoris: Secondary | ICD-10-CM

## 2012-03-16 DIAGNOSIS — E785 Hyperlipidemia, unspecified: Secondary | ICD-10-CM

## 2012-03-16 DIAGNOSIS — R0989 Other specified symptoms and signs involving the circulatory and respiratory systems: Secondary | ICD-10-CM

## 2012-03-16 NOTE — Assessment & Plan Note (Signed)
Stable. Continue secondary preventative therapy. Nitroglycerin renewed.

## 2012-03-16 NOTE — Patient Instructions (Addendum)
Your physician wants you to follow-up in: 1 year with Dr. Wall. You will receive a reminder letter in the mail two months in advance. If you don't receive a letter, please call our office to schedule the follow-up appointment.  Your physician recommends that you continue on your current medications as directed. Please refer to the Current Medication list given to you today.  

## 2012-03-16 NOTE — Progress Notes (Signed)
HPI Benjamin Brewer comes in today for evaluation and management of his coronary disease. He is doing remarkably well with no symptoms of angina or chest pain. He has no presyncope or syncope. He is always a little bradycardic.  He is very compliant with his meds. Laboratory data followed by primary care.  Past Medical History  Diagnosis Date  . CAD (coronary artery disease)     native vessel  . Other dyspnea and respiratory abnormality     on exertion  . Intermediate coronary syndrome     angina, unstable  . Dyslipidemia   . Benign prostatic hypertrophy     Hx of     Current Outpatient Prescriptions  Medication Sig Dispense Refill  . alfuzosin (UROXATRAL) 10 MG 24 hr tablet Take 10 mg by mouth daily.        . AMOXICILLIN PO Take by mouth 2 (two) times daily.      Marland Kitchen aspirin 81 MG EC tablet Take 81 mg by mouth daily.        Marland Kitchen atorvastatin (LIPITOR) 20 MG tablet Take 20 mg by mouth daily.        . clopidogrel (PLAVIX) 75 MG tablet Take 75 mg by mouth daily.        Marland Kitchen diltiazem (CARDIZEM CD) 180 MG 24 hr capsule Take 180 mg by mouth daily.        . ferrous sulfate (FERRO-BOB) 325 (65 FE) MG tablet Take 325 mg by mouth daily.        . metoprolol succinate (TOPROL-XL) 25 MG 24 hr tablet 1/2 tab po qd      . Multiple Vitamins-Minerals (ICAPS MV) TABS Take 1 tablet by mouth daily.        . niacin (NIASPAN) 500 MG CR tablet Take 500 mg by mouth daily.        Marland Kitchen NITROSTAT 0.4 MG SL tablet place 1 tablet under the tongue if needed every 5 minutes for chest pain for 3 doses IF NO RELIEF AFTER 3RD DOSE CALL PRESCRIBER OR 911.  25 tablet  0  . pantoprazole (PROTONIX) 20 MG tablet Take 20 mg by mouth 2 (two) times daily.         No Known Allergies  Family History  Problem Relation Age of Onset  . Stroke Mother   . Stroke      family history    History   Social History  . Marital Status: Married    Spouse Name: N/A    Number of Children: N/A  . Years of Education: N/A   Occupational  History  . Not on file.   Social History Main Topics  . Smoking status: Never Smoker   . Smokeless tobacco: Never Used  . Alcohol Use: Yes     occasional   . Drug Use: No  . Sexually Active: Not on file   Other Topics Concern  . Not on file   Social History Narrative   Married, retired 12/95. Gets regular exercise.     ROS ALL NEGATIVE EXCEPT THOSE NOTED IN HPI  PE  General Appearance: well developed, well nourished in no acute distress HEENT: symmetrical face, PERRLA, good dentition  Neck: no JVD, thyromegaly, or adenopathy, trachea midline Chest: symmetric without deformity Cardiac: PMI non-displaced, RRR, normal S1, S2, no gallop or murmur Lung: clear to ausculation and percussion Vascular: all pulses full without bruits  Abdominal: nondistended, nontender, good bowel sounds, no HSM, no bruits Extremities: no cyanosis, clubbing or edema, no sign  of DVT, no varicosities  Skin: normal color, no rashes Neuro: alert and oriented x 3, non-focal Pysch: normal affect  EKG Sinus bradycardia otherwise normal EKG BMET    Component Value Date/Time   NA 141 05/16/2008 0710   K 3.8 05/16/2008 0710   CL 104 05/16/2008 0710   CO2 28 05/16/2008 0710   GLUCOSE 95 05/16/2008 0710   BUN 9 05/16/2008 0710   CREATININE 1.26 05/16/2008 0710   CALCIUM 9.7 05/16/2008 0710   GFRNONAA 56* 05/16/2008 0710   GFRAA  Value: >60        The eGFR has been calculated using the MDRD equation. This calculation has not been validated in all clinical 05/16/2008 0710    Lipid Panel  No results found for this basename: chol, trig, hdl, cholhdl, vldl, ldlcalc    CBC    Component Value Date/Time   WBC 8.4 05/16/2008 0710   RBC 4.42 05/16/2008 0710   HGB 13.0 05/16/2008 0710   HCT 37.7* 05/16/2008 0710   PLT 223 05/16/2008 0710   MCV 85.3 05/16/2008 0710   MCHC 34.4 05/16/2008 0710   RDW 14.6 05/16/2008 0710

## 2012-10-07 ENCOUNTER — Observation Stay: Payer: Self-pay | Admitting: Internal Medicine

## 2012-10-07 LAB — CBC
HGB: 12.6 g/dL — ABNORMAL LOW (ref 13.0–18.0)
MCH: 31.6 pg (ref 26.0–34.0)
MCV: 91 fL (ref 80–100)
RBC: 4 10*6/uL — ABNORMAL LOW (ref 4.40–5.90)
WBC: 9.1 10*3/uL (ref 3.8–10.6)

## 2012-10-07 LAB — COMPREHENSIVE METABOLIC PANEL
Albumin: 4.1 g/dL (ref 3.4–5.0)
Alkaline Phosphatase: 85 U/L (ref 50–136)
Anion Gap: 6 — ABNORMAL LOW (ref 7–16)
Bilirubin,Total: 0.4 mg/dL (ref 0.2–1.0)
Calcium, Total: 8.6 mg/dL (ref 8.5–10.1)
Chloride: 105 mmol/L (ref 98–107)
Co2: 28 mmol/L (ref 21–32)
Glucose: 105 mg/dL — ABNORMAL HIGH (ref 65–99)
Osmolality: 283 (ref 275–301)
SGPT (ALT): 43 U/L (ref 12–78)
Sodium: 139 mmol/L (ref 136–145)
Total Protein: 7.1 g/dL (ref 6.4–8.2)

## 2012-10-07 LAB — TROPONIN I: Troponin-I: <0.02 ng/mL

## 2012-10-07 LAB — CK TOTAL AND CKMB (NOT AT ARMC)
CK, Total: 152 U/L (ref 35–232)
CK-MB: 1.5 ng/mL (ref 0.5–3.6)

## 2012-10-07 LAB — LIPASE, BLOOD: Lipase: 112 U/L (ref 73–393)

## 2012-10-08 ENCOUNTER — Other Ambulatory Visit: Payer: Self-pay | Admitting: Cardiovascular Disease

## 2012-10-08 DIAGNOSIS — R079 Chest pain, unspecified: Secondary | ICD-10-CM

## 2012-10-08 LAB — CBC WITH DIFFERENTIAL/PLATELET
Basophil #: 0.1 10*3/uL (ref 0.0–0.1)
Basophil %: 0.7 %
Eosinophil #: 0.2 10*3/uL (ref 0.0–0.7)
Eosinophil %: 2.6 %
HCT: 34.7 % — ABNORMAL LOW (ref 40.0–52.0)
HGB: 11.7 g/dL — ABNORMAL LOW (ref 13.0–18.0)
Lymphocyte %: 8.8 %
MCH: 30.3 pg (ref 26.0–34.0)
MCHC: 33.7 g/dL (ref 32.0–36.0)
Monocyte %: 10.1 %
Neutrophil #: 5.6 10*3/uL (ref 1.4–6.5)
Platelet: 172 10*3/uL (ref 150–440)
RBC: 3.86 10*6/uL — ABNORMAL LOW (ref 4.40–5.90)
WBC: 7.2 10*3/uL (ref 3.8–10.6)

## 2012-10-08 LAB — CK TOTAL AND CKMB (NOT AT ARMC)
CK, Total: 93 U/L (ref 35–232)
CK-MB: 0.7 ng/mL (ref 0.5–3.6)

## 2012-10-08 LAB — TROPONIN I
Troponin-I: <0.02 ng/mL
Troponin-I: <0.02 ng/mL

## 2012-10-08 LAB — LIPID PANEL: HDL Cholesterol: 43 mg/dL (ref 40–60)

## 2012-10-08 LAB — BASIC METABOLIC PANEL
BUN: 23 mg/dL — ABNORMAL HIGH (ref 7–18)
Chloride: 108 mmol/L — ABNORMAL HIGH (ref 98–107)
Co2: 27 mmol/L (ref 21–32)
Creatinine: 1.25 mg/dL (ref 0.60–1.30)

## 2012-10-08 MED ORDER — PANTOPRAZOLE SODIUM 40 MG PO TBEC: 40.0000 mg | DELAYED_RELEASE_TABLET | Freq: Every day | ORAL | Status: DC

## 2012-10-10 ENCOUNTER — Telehealth: Payer: Self-pay

## 2012-10-10 NOTE — Telephone Encounter (Signed)
Scheduled for 4/2 at [facility] Pt informed Understanding verb

## 2012-10-10 NOTE — Telephone Encounter (Signed)
Message copied by Marcelle Overlie on Mon Oct 10, 2012  7:59 AM ------      Message from: [name] A      Created: Sat Oct 08, 2012  2:53 PM       Benjamin Brewer is a patient of Dr. Daleen Squibb who was admitted over the weekend to [facility] with atypical chest pain likely GERD. He had 2 previous stents.       I want him to scheduled for a treadmill nuclear stress test either at [facility] or our office in [place] (his preference).        ------

## 2012-10-12 ENCOUNTER — Ambulatory Visit: Payer: Self-pay | Admitting: Cardiovascular Disease

## 2012-10-12 DIAGNOSIS — R079 Chest pain, unspecified: Secondary | ICD-10-CM

## 2012-10-13 ENCOUNTER — Encounter: Payer: Self-pay | Admitting: Cardiovascular Disease

## 2013-06-07 ENCOUNTER — Other Ambulatory Visit: Payer: Self-pay | Admitting: Cardiovascular Disease

## 2013-06-16 ENCOUNTER — Encounter: Payer: Self-pay | Admitting: Cardiovascular Disease

## 2013-06-16 ENCOUNTER — Ambulatory Visit (INDEPENDENT_AMBULATORY_CARE_PROVIDER_SITE_OTHER): Payer: Medicare Other | Admitting: Cardiovascular Disease

## 2013-06-16 VITALS — BP 180/83 | HR 48 | Ht 72.0 in | Wt 205.8 lb

## 2013-06-16 DIAGNOSIS — R001 Bradycardia, unspecified: Secondary | ICD-10-CM

## 2013-06-16 DIAGNOSIS — I2510 Atherosclerotic heart disease of native coronary artery without angina pectoris: Secondary | ICD-10-CM

## 2013-06-16 DIAGNOSIS — I498 Other specified cardiac arrhythmias: Secondary | ICD-10-CM

## 2013-06-16 DIAGNOSIS — I10 Essential (primary) hypertension: Secondary | ICD-10-CM

## 2013-06-16 MED ORDER — AMLODIPINE BESYLATE 5 MG PO TABS: 5.0000 mg | ORAL_TABLET | Freq: Every day | ORAL | Status: DC

## 2013-06-16 MED ORDER — METOPROLOL SUCCINATE ER 25 MG PO TB24: 25.0000 mg | ORAL_TABLET | Freq: Every day | ORAL | Status: DC

## 2013-06-16 NOTE — Assessment & Plan Note (Signed)
Blood pressure is elevated today. I made the following changes to his medications: I stopped diltiazem and started amlodipine 5 mg once daily. Toprol was increased to 25 mg once daily.

## 2013-06-16 NOTE — Progress Notes (Signed)
HPI  [name] is a pleasant 79 year old male who is here today for a followup visit regarding coronary artery disease. He has been followed in the past by Dr. wall. He has known history of coronary artery disease status post angioplasty and drug-eluting stent placement to the LAD and RCA in 2009.  Other conditions include unspecified tachycardia and hyperlipidemia. He was hospitalized at [facility] in 2013 for atypical chest pain. He underwent an outpatient nuclear stress test which showed no evidence of ischemia with normal ejection fraction. He has been doing very well and denies any chest pain, dyspnea, dizziness or palpitations.  No Known Allergies   Current Outpatient Prescriptions on File Prior to Visit  Medication Sig Dispense Refill  . alfuzosin (UROXATRAL) 10 MG 24 hr tablet Take 10 mg by mouth daily.        Marland Kitchen aspirin 81 MG EC tablet Take 81 mg by mouth daily.        Marland Kitchen atorvastatin (LIPITOR) 20 MG tablet Take 20 mg by mouth daily.        . clopidogrel (PLAVIX) 75 MG tablet Take 75 mg by mouth daily.        . ferrous sulfate (FERRO-BOB) 325 (65 FE) MG tablet Take 325 mg by mouth daily.        . niacin (NIASPAN) 500 MG CR tablet Take 500 mg by mouth daily.        Marland Kitchen NITROSTAT 0.4 MG SL tablet place 1 tablet under the tongue if needed every 5 minutes for chest pain for 3 doses IF NO RELIEF AFTER 3RD DOSE CALL PRESCRIBER OR 911.  25 tablet  0  . pantoprazole (PROTONIX) 40 MG tablet take 1 tablet by mouth once daily  30 tablet  6   No current facility-administered medications on file prior to visit.     Past Medical History  Diagnosis Date  . Other dyspnea and respiratory abnormality     on exertion  . Intermediate coronary syndrome     angina, unstable  . Dyslipidemia   . Benign prostatic hypertrophy     Hx of   . Hypertension   . Hyperlipidemia   . History of tachycardia   . CAD (coronary artery disease)     native vessel. Status post LAD and RCA angioplasty and drug-eluting  stent placement in 2009     Past Surgical History  Procedure Laterality Date  . Cardiac catheterization      [place]; x2  . Cardiac catheterization      [facility]  . Cardiac catheterization      [facility]     Family History  Problem Relation Age of Onset  . Stroke Mother   . Heart attack Mother   . Hypertension Mother   . Stroke      family history     History   Social History  . Marital Status: Married    Spouse Name: N/A    Number of Children: N/A  . Years of Education: N/A   Occupational History  . Not on file.   Social History Main Topics  . Smoking status: Never Smoker   . Smokeless tobacco: Never Used  . Alcohol Use: Yes     Comment: occasional   . Drug Use: No  . Sexual Activity: Not on file   Other Topics Concern  . Not on file   Social History Narrative   Married, retired 12/95. Gets regular exercise.       PHYSICAL EXAM  BP 180/83  Pulse 48  Ht 6' (1.829 m)  Wt 205 lb 12 oz (93.328 kg)  BMI 27.90 kg/m2 Constitutional: He is oriented to person, place, and time. He appears well-developed and well-nourished. No distress.  HENT: No nasal discharge.  Head: Normocephalic and atraumatic.  Eyes: Pupils are equal and round.  No discharge. Neck: Normal range of motion. Neck supple. No JVD present. No thyromegaly present.  Cardiovascular: Normal rate, regular rhythm, normal heart sounds. Exam reveals no gallop and no friction rub. No murmur heard.  Pulmonary/Chest: Effort normal and breath sounds normal. No stridor. No respiratory distress. He has no wheezes. He has no rales. He exhibits no tenderness.  Abdominal: Soft. Bowel sounds are normal. He exhibits no distension. There is no tenderness. There is no rebound and no guarding.  Musculoskeletal: Normal range of motion. He exhibits no edema and no tenderness.  Neurological: He is alert and oriented to person, place, and time. Coordination normal.  Skin: Skin is warm and dry. No rash noted. He  is not diaphoretic. No erythema. No pallor.  Psychiatric: He has a normal mood and affect. His behavior is normal. Judgment and thought content normal.       EKG: Sinus bradycardia with a heart rate of 48 beats per minute poor R-wave progression in the anterior leads.   ASSESSMENT AND PLAN

## 2013-06-16 NOTE — Assessment & Plan Note (Signed)
Overall he seems to be asymptomatic. However, it might be reasonable to leave him on the medication for tachycardia instead of 2. Thus, I stop diltiazem and switched him to the pain. If he develops recurrent tachycardia, I favor increasing the dose of Toprol.

## 2013-06-16 NOTE — Patient Instructions (Signed)
Make the following changes to your medications:  1. Stop Diltiazem 2. Start Amlodipine 5 mg once daily.  3. Increase Toprol to 25 mg once daily.   Continue to monitor blood pressure and heart rate  Follow up in 3 months.

## 2013-06-16 NOTE — Assessment & Plan Note (Signed)
He is doing very well with no symptoms suggestive of recurrent angina. Continue medical therapy.

## 2013-06-21 ENCOUNTER — Other Ambulatory Visit: Payer: Self-pay | Admitting: *Deleted

## 2013-07-12 ENCOUNTER — Other Ambulatory Visit: Payer: Self-pay

## 2013-07-12 ENCOUNTER — Other Ambulatory Visit: Payer: Self-pay | Admitting: Cardiovascular Disease

## 2013-07-12 MED ORDER — NITROGLYCERIN 0.4 MG SL SUBL: SUBLINGUAL_TABLET | SUBLINGUAL | Status: DC

## 2013-07-12 NOTE — Telephone Encounter (Signed)
Requested Prescriptions   Signed Prescriptions Disp Refills  . atorvastatin (LIPITOR) 20 MG tablet 90 tablet 3    Sig: take 1 tablet by mouth once daily    Authorizing Provider: [name] A    Ordering User: Kendrick Fries

## 2013-07-18 ENCOUNTER — Other Ambulatory Visit: Payer: Self-pay | Admitting: Cardiovascular Disease

## 2013-09-15 ENCOUNTER — Ambulatory Visit (INDEPENDENT_AMBULATORY_CARE_PROVIDER_SITE_OTHER): Payer: Medicare Other | Admitting: Cardiovascular Disease

## 2013-09-15 ENCOUNTER — Encounter: Payer: Self-pay | Admitting: Cardiovascular Disease

## 2013-09-15 VITALS — BP 166/72 | HR 61 | Ht 72.0 in | Wt 203.0 lb

## 2013-09-15 DIAGNOSIS — I10 Essential (primary) hypertension: Secondary | ICD-10-CM

## 2013-09-15 DIAGNOSIS — I2510 Atherosclerotic heart disease of native coronary artery without angina pectoris: Secondary | ICD-10-CM

## 2013-09-15 DIAGNOSIS — I498 Other specified cardiac arrhythmias: Secondary | ICD-10-CM

## 2013-09-15 DIAGNOSIS — E785 Hyperlipidemia, unspecified: Secondary | ICD-10-CM

## 2013-09-15 DIAGNOSIS — R001 Bradycardia, unspecified: Secondary | ICD-10-CM

## 2013-09-15 NOTE — Assessment & Plan Note (Signed)
This resolved after stopping diltiazem.

## 2013-09-15 NOTE — Assessment & Plan Note (Signed)
Given the negative outcome data on niacin from last year, I recommend stopping this medication and continuing treatment with atorvastatin. I recommend a target LDL of less than 70.

## 2013-09-15 NOTE — Patient Instructions (Signed)
Stop taking Niacin.   Continue other medications.   Your physician wants you to follow-up in: 12 months.  You will receive a reminder letter in the mail two months in advance. If you don't receive a letter, please call our office to schedule the follow-up appointment.

## 2013-09-15 NOTE — Assessment & Plan Note (Signed)
He is doing very well with no symptoms suggestive of angina. Continue medical therapy. 

## 2013-09-15 NOTE — Assessment & Plan Note (Signed)
Blood pressure is elevated today. However, home blood pressure much better. Continue same medications. If blood pressure continues to be elevated, treatment with an ACE inhibitor or ARB can be considered.

## 2013-09-15 NOTE — Progress Notes (Signed)
HPI  [name] is a pleasant 80 year old male who is here today for a followup visit regarding coronary artery disease.He has known history of coronary artery disease status post angioplasty and drug-eluting stent placement to the LAD and RCA in 2009.  Other conditions include unspecified tachycardia and hyperlipidemia. He was hospitalized at [facility] in 2013 for atypical chest pain. He underwent an outpatient nuclear stress test which showed no evidence of ischemia with normal ejection fraction. He has been doing very well and denies any chest pain, dyspnea, dizziness or palpitations. During last visit, he was noted to be bradycardic with a heart rate of 48 beats per minute. Thus, I discontinued diltiazem, increased Toprol to 25 mg daily and added amlodipine 5 mg once daily. He denies tachycardia or significant palpitations.  No Known Allergies   Current Outpatient Prescriptions on File Prior to Visit  Medication Sig Dispense Refill  . alfuzosin (UROXATRAL) 10 MG 24 hr tablet Take 10 mg by mouth daily.        Marland Kitchen amLODipine (NORVASC) 5 MG tablet Take 1 tablet (5 mg total) by mouth daily.  30 tablet  6  . aspirin 81 MG EC tablet Take 81 mg by mouth daily.        Marland Kitchen atorvastatin (LIPITOR) 20 MG tablet take 1 tablet by mouth once daily  90 tablet  3  . clopidogrel (PLAVIX) 75 MG tablet Take 75 mg by mouth daily.        . cyanocobalamin 500 MCG tablet Take 500 mcg by mouth daily.      . ferrous sulfate (FERRO-BOB) 325 (65 FE) MG tablet Take 325 mg by mouth daily.        . metoprolol succinate (TOPROL-XL) 25 MG 24 hr tablet Take 1 tablet (25 mg total) by mouth daily. 1/2 tab po qd  30 tablet  6  . Multiple Vitamins-Minerals (EYE VITAMINS PO) Take by mouth daily.      . niacin (NIASPAN) 500 MG CR tablet Take 500 mg by mouth daily.        . nitroGLYCERIN (NITROSTAT) 0.4 MG SL tablet place 1 tablet under the tongue if needed every 5 minutes for chest pain for 3 doses IF NO RELIEF AFTER 3RD DOSE CALL  PRESCRIBER OR 911.  25 tablet  3  . pantoprazole (PROTONIX) 40 MG tablet take 1 tablet by mouth once daily  30 tablet  6   No current facility-administered medications on file prior to visit.     Past Medical History  Diagnosis Date  . Other dyspnea and respiratory abnormality     on exertion  . Intermediate coronary syndrome     angina, unstable  . Dyslipidemia   . Benign prostatic hypertrophy     Hx of   . Hypertension   . Hyperlipidemia   . History of tachycardia   . CAD (coronary artery disease)     native vessel. Status post LAD and RCA angioplasty and drug-eluting stent placement in 2009     Past Surgical History  Procedure Laterality Date  . Cardiac catheterization      [place]; x2  . Cardiac catheterization      [facility]  . Cardiac catheterization      [facility]     Family History  Problem Relation Age of Onset  . Stroke Mother   . Heart attack Mother   . Hypertension Mother   . Stroke      family history     History  Social History  . Marital Status: Married    Spouse Name: N/A    Number of Children: N/A  . Years of Education: N/A   Occupational History  . Not on file.   Social History Main Topics  . Smoking status: Never Smoker   . Smokeless tobacco: Never Used  . Alcohol Use: Yes     Comment: occasional   . Drug Use: No  . Sexual Activity: Not on file   Other Topics Concern  . Not on file   Social History Narrative   Married, retired 12/95. Gets regular exercise.       PHYSICAL EXAM   BP 166/72  Pulse 61  Ht 6' (1.829 m)  Wt 203 lb (92.08 kg)  BMI 27.53 kg/m2 Constitutional: He is oriented to person, place, and time. He appears well-developed and well-nourished. No distress.  HENT: No nasal discharge.  Head: Normocephalic and atraumatic.  Eyes: Pupils are equal and round.  No discharge. Neck: Normal range of motion. Neck supple. No JVD present. No thyromegaly present.  Cardiovascular: Normal rate, regular rhythm,  normal heart sounds. Exam reveals no gallop and no friction rub. No murmur heard.  Pulmonary/Chest: Effort normal and breath sounds normal. No stridor. No respiratory distress. He has no wheezes. He has no rales. He exhibits no tenderness.  Abdominal: Soft. Bowel sounds are normal. He exhibits no distension. There is no tenderness. There is no rebound and no guarding.  Musculoskeletal: Normal range of motion. He exhibits no edema and no tenderness.  Neurological: He is alert and oriented to person, place, and time. Coordination normal.  Skin: Skin is warm and dry. No rash noted. He is not diaphoretic. No erythema. No pallor.  Psychiatric: He has a normal mood and affect. His behavior is normal. Judgment and thought content normal.        ASSESSMENT AND PLAN

## 2013-10-23 ENCOUNTER — Ambulatory Visit (INDEPENDENT_AMBULATORY_CARE_PROVIDER_SITE_OTHER): Payer: Medicare Other | Admitting: Physician Assistant

## 2013-10-23 ENCOUNTER — Encounter: Payer: Self-pay | Admitting: Physician Assistant

## 2013-10-23 VITALS — BP 160/92 | HR 66 | Ht 72.0 in | Wt 205.2 lb

## 2013-10-23 DIAGNOSIS — R42 Dizziness and giddiness: Secondary | ICD-10-CM

## 2013-10-23 DIAGNOSIS — E785 Hyperlipidemia, unspecified: Secondary | ICD-10-CM

## 2013-10-23 DIAGNOSIS — I2510 Atherosclerotic heart disease of native coronary artery without angina pectoris: Secondary | ICD-10-CM

## 2013-10-23 DIAGNOSIS — H8110 Benign paroxysmal vertigo, unspecified ear: Secondary | ICD-10-CM

## 2013-10-23 DIAGNOSIS — I10 Essential (primary) hypertension: Secondary | ICD-10-CM

## 2013-10-23 MED ORDER — FLUTICASONE PROPIONATE 50 MCG/ACT NA SUSP: 1.0000 | Freq: Every day | NASAL | Status: DC

## 2013-10-23 MED ORDER — ATORVASTATIN CALCIUM 40 MG PO TABS: 40.0000 mg | ORAL_TABLET | Freq: Every day | ORAL | Status: DC

## 2013-10-23 NOTE — Assessment & Plan Note (Signed)
Well-controlled per home readings. Suspect white coat hypertension contributing to elevated readings today. Continue current antihypertensives.

## 2013-10-23 NOTE — Progress Notes (Signed)
Patient ID: Benjamin Brewer, male   DOB: [birth year], 80 y.o.   MRN: [id number]   Date:  10/23/2013   ID:  Benjamin Brewer, DOB [birth year], MRN [id number]  PCP:  Marcello Fennel, MD  Primary Cardiologist:  Jerilynn Mages. Fletcher Anon, MD     History of Present Illness:  Benjamin Brewer is a 80 y.o. male w/ PMHx s/f CAD (PCI-LAD & -RCA in 2009), HTN, HLD and possible subclinical tachy-brady syndrome who presents today c/o dizziness.  He was seen in follow-up last month by Dr. Fletcher Anon. He had previously been treated with diltiazem for an unspecified tachycardia which was stopped due to sinus bradycardia (HR 48 bpm). HR resolved with this. Noted to be stable from a cardiac perspective last month.  He reports being in his USOH until last PM. He laid down in bed and felt the room spinning around him. He stood up and had trouble getting his bearings. He denied weakness. He notes that when he would turn his head quickly, he would experience a similar sensation. He has a history of recurrent sinus congestion. He was working outside all weekend around pollen. He also was not hydrating regularly. He does note increased nasal congestion and post nasal drip. No fevers or chills.   He states BPs have actually been well-controlled at home since last follow-up. These usually run between high 130s-140s/80s. BP during last night's event was 139/80. He notes BP increases in the doctor's office.  ORTHOSTATIC VS Lying 160/82, 55 bpm Sitting 140/86, 57 bpm Standing 160/90, 160/90, 160/82; 62, 65, 66  EKG: NSR, unspecified IVCD, isolated TWI in III, otherwise no ST/T changes  Wt Readings from Last 3 Encounters:  10/23/13 205 lb 4 oz (93.101 kg)  09/15/13 203 lb (92.08 kg)  06/16/13 205 lb 12 oz (93.328 kg)     Past Medical History  Diagnosis Date  . Other dyspnea and respiratory abnormality     on exertion  . Intermediate coronary syndrome     angina, unstable  . Dyslipidemia   . Benign prostatic hypertrophy     Hx of     . Hypertension   . Hyperlipidemia   . History of tachycardia   . CAD (coronary artery disease)     native vessel. Status post LAD and RCA angioplasty and drug-eluting stent placement in 2009    Current Outpatient Prescriptions  Medication Sig Dispense Refill  . alfuzosin (UROXATRAL) 10 MG 24 hr tablet Take 10 mg by mouth daily.        Marland Kitchen amLODipine (NORVASC) 5 MG tablet Take 1 tablet (5 mg total) by mouth daily.  30 tablet  6  . aspirin 81 MG EC tablet Take 81 mg by mouth daily.        Marland Kitchen atorvastatin (LIPITOR) 20 MG tablet take 1 tablet by mouth once daily  90 tablet  3  . clopidogrel (PLAVIX) 75 MG tablet Take 75 mg by mouth daily.        . cyanocobalamin 500 MCG tablet Take 500 mcg by mouth daily.      . ferrous sulfate (FERRO-BOB) 325 (65 FE) MG tablet Take 325 mg by mouth daily.        . metoprolol succinate (TOPROL-XL) 25 MG 24 hr tablet Take 25 mg by mouth daily.      . Multiple Vitamins-Minerals (EYE VITAMINS PO) Take by mouth daily.      . nitroGLYCERIN (NITROSTAT) 0.4 MG SL tablet place 1 tablet under the tongue if needed  every 5 minutes for chest pain for 3 doses IF NO RELIEF AFTER 3RD DOSE CALL PRESCRIBER OR 911.  25 tablet  3  . pantoprazole (PROTONIX) 40 MG tablet take 1 tablet by mouth once daily  30 tablet  6  . RAPAFLO 8 MG CAPS capsule Take 8 mg by mouth daily with breakfast.        No current facility-administered medications for this visit.    Allergies:   No Known Allergies  Social History:  The patient  reports that he has never smoked. He has never used smokeless tobacco. He reports that he drinks alcohol. He reports that he does not use illicit drugs.   Family History:  Family History  Problem Relation Age of Onset  . Stroke Mother   . Heart attack Mother   . Hypertension Mother   . Stroke      family history    Review of Systems: General: negative for chills, fever, night sweats or weight changes.  Cardiovascular: negative for chest pain, dyspnea on  exertion, edema, orthopnea, palpitations, paroxysmal nocturnal dyspnea or shortness of breath Dermatological: negative for rash Respiratory: negative for cough or wheezing Urologic: negative for hematuria Abdominal: negative for nausea, vomiting, diarrhea, bright red blood per rectum, melena, or hematemesis Neurologic: positive for dizziness, negative for visual changes, syncope All other systems reviewed and are otherwise negative except as noted above.  PHYSICAL EXAM: VS:  BP 160/92  Pulse 66  Ht 6' (1.829 m)  Wt 205 lb 4 oz (93.101 kg)  BMI 27.83 kg/m2 Well nourished, well developed, in no acute distress HEENT: normal, PERRL, no appreciable nystagmus (Dix Hallpike not attempted) Neck: no JVD or bruits Cardiac:  normal S1, S2; RRR; no murmur or gallops Lungs:  clear to auscultation bilaterally, no wheezing, rhonchi or rales Abd: soft, nontender, no hepatomegaly, normoactive BS x 4 quads Ext: no edema, cyanosis or clubbing Skin: warm and dry, cap refill < 2 sec Neuro:  CNs 2-12 intact, no focal abnormalities noted Musculoskeletal: strength and tone appropriate for age  Psych: normal affect

## 2013-10-23 NOTE — Assessment & Plan Note (Signed)
LDL 97 on 09/2013 labwork. Goal < 70 per Dr. Tyrell Antonio last office note. Increase atorvastatin to 40mg  daily. Check LFTs in 6 weeks. PCP to check lipid panel in the near future.

## 2013-10-23 NOTE — Assessment & Plan Note (Signed)
Denies exertional chest pain or dyspnea. No ischemic changes on EKG. Stable. Continue current cardiac regimen.

## 2013-10-23 NOTE — Patient Instructions (Addendum)
Your symptoms are most consistent with benign positional vertigo. This can be related to increased inner ear pressure from upper respiratory/sinus congestion.  We will start Flonase- please take as prescribed.  Please also take over-the-counter Zyrtec or Clartin as well as Mucinex-DM (decongestant).   Please follow-up with your primary care provider within 1 week.  We will also increase atorvastatin. Please take as prescribed.

## 2013-10-23 NOTE — Assessment & Plan Note (Signed)
The patient endorses vertigo (room spinning) with head movements c/w BPPV over symptoms to suggest reduced cerebral perfusion (orthostatic hypotension, arrhythmia, etc). Sitting BP on orthostatic VS in the office today may represent an outlier as lying and standing BPs were unchanged. He was asymptomatic with this as well. Suspect vertigo may be secondary to increased middle ear pressure/ETD from sinus congestion, inflamed/boggy turbinates which in turn may be attributed to seasonal allergies. Will prescribe Flonase. Recommend Claritin or Zyrtec and Mucinex-DM. Have also recommended staying hydrated throughout the day, especially when working outside. Follow-up with PCP within 1 week.

## 2013-12-28 ENCOUNTER — Other Ambulatory Visit: Payer: Self-pay | Admitting: Cardiovascular Disease

## 2014-01-14 ENCOUNTER — Other Ambulatory Visit: Payer: Self-pay | Admitting: Cardiovascular Disease

## 2014-02-28 ENCOUNTER — Telehealth: Payer: Self-pay

## 2014-02-28 NOTE — Telephone Encounter (Signed)
Request from [employer] , sent to [employer] on 02/28/2014 .

## 2014-04-06 ENCOUNTER — Other Ambulatory Visit: Payer: Self-pay | Admitting: Cardiovascular Disease

## 2014-04-26 DIAGNOSIS — E7800 Pure hypercholesterolemia, unspecified: Secondary | ICD-10-CM | POA: Insufficient documentation

## 2014-05-24 ENCOUNTER — Other Ambulatory Visit: Payer: Self-pay | Admitting: Cardiovascular Disease

## 2014-07-11 ENCOUNTER — Other Ambulatory Visit: Payer: Self-pay | Admitting: Cardiovascular Disease

## 2014-08-19 ENCOUNTER — Emergency Department: Payer: Self-pay | Admitting: Emergency Medicine

## 2014-08-19 LAB — BASIC METABOLIC PANEL
Anion Gap: 11 (ref 7–16)
BUN: 18 mg/dL (ref 7–18)
CHLORIDE: 106 mmol/L (ref 98–107)
CO2: 25 mmol/L (ref 21–32)
Calcium, Total: 9.3 mg/dL (ref 8.5–10.1)
Creatinine: 1.29 mg/dL (ref 0.60–1.30)
EGFR (African American): >60
EGFR (Non-African Amer.): 57 — ABNORMAL LOW
Glucose: 99 mg/dL (ref 65–99)
OSMOLALITY: 285 (ref 275–301)
POTASSIUM: 3.7 mmol/L (ref 3.5–5.1)
Sodium: 142 mmol/L (ref 136–145)

## 2014-08-19 LAB — CBC
HCT: 43.1 % (ref 40.0–52.0)
HGB: 14.4 g/dL (ref 13.0–18.0)
MCH: 29.9 pg (ref 26.0–34.0)
MCHC: 33.4 g/dL (ref 32.0–36.0)
MCV: 90 fL (ref 80–100)
Platelet: 215 10*3/uL (ref 150–440)
RBC: 4.81 10*6/uL (ref 4.40–5.90)
RDW: 13.8 % (ref 11.5–14.5)
WBC: 8.3 10*3/uL (ref 3.8–10.6)

## 2014-08-19 LAB — PROTIME-INR
INR: 0.9
Prothrombin Time: 12.5 secs

## 2014-08-19 LAB — CK TOTAL AND CKMB (NOT AT ARMC)
CK, Total: 138 U/L (ref 39–308)
CK-MB: 2 ng/mL (ref 0.5–3.6)

## 2014-08-19 LAB — TROPONIN I

## 2014-08-19 LAB — APTT: Activated PTT: 28.6 secs (ref 23.6–35.9)

## 2014-08-20 ENCOUNTER — Encounter: Payer: Self-pay | Admitting: Cardiovascular Disease

## 2014-08-20 ENCOUNTER — Ambulatory Visit (INDEPENDENT_AMBULATORY_CARE_PROVIDER_SITE_OTHER): Payer: PPO | Admitting: Cardiovascular Disease

## 2014-08-20 ENCOUNTER — Telehealth: Payer: Self-pay

## 2014-08-20 VITALS — BP 128/62 | HR 118 | Ht 72.0 in | Wt 201.2 lb

## 2014-08-20 DIAGNOSIS — I4891 Unspecified atrial fibrillation: Secondary | ICD-10-CM

## 2014-08-20 DIAGNOSIS — I2510 Atherosclerotic heart disease of native coronary artery without angina pectoris: Secondary | ICD-10-CM

## 2014-08-20 DIAGNOSIS — I10 Essential (primary) hypertension: Secondary | ICD-10-CM

## 2014-08-20 MED ORDER — METOPROLOL SUCCINATE ER 50 MG PO TB24: 50.0000 mg | ORAL_TABLET | Freq: Every day | ORAL | Status: DC

## 2014-08-20 MED ORDER — APIXABAN 5 MG PO TABS: 5.0000 mg | ORAL_TABLET | Freq: Two times a day (BID) | ORAL | Status: DC

## 2014-08-20 NOTE — Telephone Encounter (Signed)
Pt states he was seen in ED on Sat night for afib, states he is still in afib now. Please call.

## 2014-08-20 NOTE — Progress Notes (Signed)
HPI  Mr. Gill is a pleasant 81 year old male who is here today for a followup visit regarding coronary artery disease. He has known history of coronary artery disease status post angioplasty and drug-eluting stent placement to the LAD and RCA in 2009.  Other conditions include unspecified tachycardia and hyperlipidemia. He was hospitalized at [facility] in 2013 for atypical chest pain. He underwent an outpatient nuclear stress test which showed no evidence of ischemia with normal ejection fraction.  Last year, he was noted to be bradycardic with a heart rate of 48 beats per minute. Thus, I discontinued diltiazem, increased Toprol to 25 mg daily and added amlodipine 5 mg once daily.  On Saturday, he felt sudden palpitations and tachycardia associated with mild dizziness and shortness of breath. He went to the emergency room he was noted to be in atrial fibrillation with ventricular rate of 113 bpm. He was given IV diltiazem and discharged home. His labs were unremarkable. Creatinine was 1.29 with a BUN of 18. CBC was normal. Chest x-ray showed no acute cardiopulmonary disease.   Allergies  Allergen Reactions  . Terazosin     Other reaction(s): Muscle Pain     Current Outpatient Prescriptions on File Prior to Visit  Medication Sig Dispense Refill  . alfuzosin (UROXATRAL) 10 MG 24 hr tablet Take 10 mg by mouth daily.      . cyanocobalamin 500 MCG tablet Take 500 mcg by mouth daily.    . ferrous sulfate (FERRO-BOB) 325 (65 FE) MG tablet Take 325 mg by mouth daily.      . fluticasone (FLONASE) 50 MCG/ACT nasal spray Place 1-2 sprays into both nostrils daily. 16 g 2  . Multiple Vitamins-Minerals (EYE VITAMINS PO) Take by mouth daily.    . nitroGLYCERIN (NITROSTAT) 0.4 MG SL tablet place 1 tablet under the tongue if needed every 5 minutes for chest pain for 3 doses IF NO RELIEF AFTER 3RD DOSE CALL PRESCRIBER OR 911. 25 tablet 3  . pantoprazole (PROTONIX) 40 MG tablet take 1 tablet by mouth once daily  30 tablet 6  . RAPAFLO 8 MG CAPS capsule Take 8 mg by mouth daily with breakfast.      No current facility-administered medications on file prior to visit.     Past Medical History  Diagnosis Date  . Other dyspnea and respiratory abnormality     on exertion  . Intermediate coronary syndrome     angina, unstable  . Dyslipidemia   . Benign prostatic hypertrophy     Hx of   . Hypertension   . Hyperlipidemia   . History of tachycardia   . CAD (coronary artery disease)     native vessel. Status post LAD and RCA angioplasty and drug-eluting stent placement in 2009     Past Surgical History  Procedure Laterality Date  . Cardiac catheterization      [place]; x2  . Cardiac catheterization      [facility]  . Cardiac catheterization      [facility]  . Tonsillectomy       Family History  Problem Relation Age of Onset  . Stroke Mother   . Heart attack Mother   . Hypertension Mother   . Stroke      family history     History   Social History  . Marital Status: Married    Spouse Name: N/A    Number of Children: N/A  . Years of Education: N/A   Occupational History  . Not  on file.   Social History Main Topics  . Smoking status: Never Smoker   . Smokeless tobacco: Never Used  . Alcohol Use: Yes     Comment: occasional   . Drug Use: No  . Sexual Activity: Not on file   Other Topics Concern  . Not on file   Social History Narrative   Married, retired 12/95. Gets regular exercise.       PHYSICAL EXAM   BP 128/62 mmHg  Pulse 118  Ht 6' (1.829 m)  Wt 201 lb 4 oz (91.286 kg)  BMI 27.29 kg/m2 Constitutional: He is oriented to person, place, and time. He appears well-developed and well-nourished. No distress.  HENT: No nasal discharge.  Head: Normocephalic and atraumatic.  Eyes: Pupils are equal and round.  No discharge. Neck: Normal range of motion. Neck supple. No JVD present. No thyromegaly present.  Cardiovascular: Normal rate, regular rhythm, normal  heart sounds. Exam reveals no gallop and no friction rub. No murmur heard.  Pulmonary/Chest: Effort normal and breath sounds normal. No stridor. No respiratory distress. He has no wheezes. He has no rales. He exhibits no tenderness.  Abdominal: Soft. Bowel sounds are normal. He exhibits no distension. There is no tenderness. There is no rebound and no guarding.  Musculoskeletal: Normal range of motion. He exhibits no edema and no tenderness.  Neurological: He is alert and oriented to person, place, and time. Coordination normal.  Skin: Skin is warm and dry. No rash noted. He is not diaphoretic. No erythema. No pallor.  Psychiatric: He has a normal mood and affect. His behavior is normal. Judgment and thought content normal.     EKG: Atrial fibrillation  -RSR(V1) -nondiagnostic.   ABNORMAL RHYTHM     ASSESSMENT AND PLAN

## 2014-08-20 NOTE — Telephone Encounter (Signed)
Patient scheduled to be seen today. 

## 2014-08-20 NOTE — Assessment & Plan Note (Signed)
I discontinued amlodipine to allow up titration of rate control with metoprolol.

## 2014-08-20 NOTE — Patient Instructions (Signed)
Your physician has recommended you make the following change in your medication:  Stop Aspirin  Stop Plavix  Stop Amlodipine  Start Eliquis 5 mg twice daily  Increase Metoprolol 50 mg once daily   Your physician has requested that you have an echocardiogram. Echocardiography is a painless test that uses sound waves to create images of your heart. It provides your doctor with information about the size and shape of your heart and how well your heart's chambers and valves are working. This procedure takes approximately one hour. There are no restrictions for this procedure.   Your physician recommends that you schedule a follow-up appointment in:  3 weeks

## 2014-08-20 NOTE — Assessment & Plan Note (Signed)
The patient has newly diagnosed atrial fibrillation. I had a prolonged discussion with him about the natural history and management of A. fib. His ventricular rate is still not controlled. Thus, I increased the dose of Toprol to 50 mg once daily. I am hesitant to increase the dose further given previous history of sinus bradycardia. His CHADS VASc score is 4. Thus, I recommend long-term anticoagulation. I discussed different options and elected to start Eliquis 5 mg twice daily. I requested an echocardiogram to evaluate atrial size and proceed with cardioversion after at least 3 weeks of anticoagulation.

## 2014-08-20 NOTE — Assessment & Plan Note (Signed)
The patient had stenting done in 2009 with no cardiac events since then. Due to that I discontinued aspirin and Plavix given that he is now on Eliquis.

## 2014-09-04 ENCOUNTER — Other Ambulatory Visit: Payer: Self-pay

## 2014-09-04 ENCOUNTER — Other Ambulatory Visit (INDEPENDENT_AMBULATORY_CARE_PROVIDER_SITE_OTHER): Payer: PPO

## 2014-09-04 DIAGNOSIS — I2510 Atherosclerotic heart disease of native coronary artery without angina pectoris: Secondary | ICD-10-CM

## 2014-09-04 DIAGNOSIS — I4891 Unspecified atrial fibrillation: Secondary | ICD-10-CM

## 2014-09-10 ENCOUNTER — Encounter: Payer: Self-pay | Admitting: Cardiovascular Disease

## 2014-09-10 ENCOUNTER — Ambulatory Visit (INDEPENDENT_AMBULATORY_CARE_PROVIDER_SITE_OTHER): Payer: PPO | Admitting: Cardiovascular Disease

## 2014-09-10 VITALS — BP 116/62 | HR 56 | Ht 71.0 in | Wt 199.2 lb

## 2014-09-10 DIAGNOSIS — I2510 Atherosclerotic heart disease of native coronary artery without angina pectoris: Secondary | ICD-10-CM

## 2014-09-10 DIAGNOSIS — I4891 Unspecified atrial fibrillation: Secondary | ICD-10-CM

## 2014-09-10 DIAGNOSIS — I10 Essential (primary) hypertension: Secondary | ICD-10-CM

## 2014-09-10 NOTE — Assessment & Plan Note (Signed)
Blood pressure is controlled

## 2014-09-10 NOTE — Progress Notes (Signed)
HPI  [name] is a pleasant 81 year old male who is here today for a followup visit regarding coronary artery disease and paroxysmal atrial fibrillation . He has known history of coronary artery disease status post angioplasty and drug-eluting stent placement to the LAD and RCA in 2009.   He was hospitalized at [facility] in 2013 for atypical chest pain. He underwent an outpatient nuclear stress test which showed no evidence of ischemia with normal ejection fraction.  Last year, he was noted to be bradycardic with a heart rate of 48 beats per minute. Thus, I discontinued diltiazem, increased Toprol to 25 mg daily and added amlodipine 5 mg once daily.  He was seen recently for  palpitations and tachycardia associated with mild dizziness and shortness of breath. He went to the emergency room he was noted to be in atrial fibrillation with ventricular rate of 113 bpm. He was given IV diltiazem and discharged home. His labs were unremarkable. Creatinine was 1.29 with a BUN of 18. CBC was normal. Chest x-ray showed no acute cardiopulmonary disease. I increased the dose of Toprol to 50 mg once daily, stopped amlodipine, stopped aspirin and Plavix and initiated anticoagulation with Eliquis. He had quick improvement in symptoms.  He underwent an echocardiogram which showed an ejection fraction of 55-60%, mild mitral regurgitation and mildly dilated right atrium.  Allergies  Allergen Reactions  . Terazosin     Other reaction(s): Muscle Pain     Current Outpatient Prescriptions on File Prior to Visit  Medication Sig Dispense Refill  . alfuzosin (UROXATRAL) 10 MG 24 hr tablet Take 10 mg by mouth daily.      Marland Kitchen apixaban (ELIQUIS) 5 MG TABS tablet Take 1 tablet (5 mg total) by mouth 2 (two) times daily. 60 tablet 6  . atorvastatin (LIPITOR) 20 MG tablet Take 20 mg by mouth daily.    . cyanocobalamin 500 MCG tablet Take 500 mcg by mouth daily.    . ferrous sulfate (FERRO-BOB) 325 (65 FE) MG tablet Take 325 mg by  mouth daily.      . finasteride (PROSCAR) 5 MG tablet Take 5 mg by mouth daily.    . fluticasone (FLONASE) 50 MCG/ACT nasal spray Place 1-2 sprays into both nostrils daily. (Patient taking differently: Place 1-2 sprays into both nostrils as needed. ) 16 g 2  . metoprolol succinate (TOPROL-XL) 50 MG 24 hr tablet Take 1 tablet (50 mg total) by mouth daily. Take with or immediately following a meal. 30 tablet 6  . Multiple Vitamins-Minerals (EYE VITAMINS PO) Take by mouth daily.    . nitroGLYCERIN (NITROSTAT) 0.4 MG SL tablet place 1 tablet under the tongue if needed every 5 minutes for chest pain for 3 doses IF NO RELIEF AFTER 3RD DOSE CALL PRESCRIBER OR 911. 25 tablet 3  . pantoprazole (PROTONIX) 40 MG tablet take 1 tablet by mouth once daily 30 tablet 6  . RAPAFLO 8 MG CAPS capsule Take 8 mg by mouth daily with breakfast.      No current facility-administered medications on file prior to visit.     Past Medical History  Diagnosis Date  . Other dyspnea and respiratory abnormality     on exertion  . Intermediate coronary syndrome     angina, unstable  . Dyslipidemia   . Benign prostatic hypertrophy     Hx of   . Hypertension   . Hyperlipidemia   . History of tachycardia   . CAD (coronary artery disease)  native vessel. Status post LAD and RCA angioplasty and drug-eluting stent placement in 2009     Past Surgical History  Procedure Laterality Date  . Cardiac catheterization      [facility]; x2  . Cardiac catheterization      [facility]  . Cardiac catheterization      [facility]  . Tonsillectomy       Family History  Problem Relation Age of Onset  . Stroke Mother   . Heart attack Mother   . Hypertension Mother   . Stroke      family history     History   Social History  . Marital Status: Married    Spouse Name: N/A  . Number of Children: N/A  . Years of Education: N/A   Occupational History  . Not on file.   Social History Main Topics  . Smoking status:  Never Smoker   . Smokeless tobacco: Never Used  . Alcohol Use: Yes     Comment: occasional   . Drug Use: No  . Sexual Activity: Not on file   Other Topics Concern  . Not on file   Social History Narrative   Married, retired 12/95. Gets regular exercise.       PHYSICAL EXAM   BP 116/62 mmHg  Pulse 56  Ht 5\' 11"  (1.803 m)  Wt 199 lb 4 oz (90.379 kg)  BMI 27.80 kg/m2 Constitutional: He is oriented to person, place, and time. He appears well-developed and well-nourished. No distress.  HENT: No nasal discharge.  Head: Normocephalic and atraumatic.  Eyes: Pupils are equal and round.  No discharge. Neck: Normal range of motion. Neck supple. No JVD present. No thyromegaly present.  Cardiovascular: Normal rate, regular rhythm, normal heart sounds. Exam reveals no gallop and no friction rub. No murmur heard.  Pulmonary/Chest: Effort normal and breath sounds normal. No stridor. No respiratory distress. He has no wheezes. He has no rales. He exhibits no tenderness.  Abdominal: Soft. Bowel sounds are normal. He exhibits no distension. There is no tenderness. There is no rebound and no guarding.  Musculoskeletal: Normal range of motion. He exhibits no edema and no tenderness.  Neurological: He is alert and oriented to person, place, and time. Coordination normal.  Skin: Skin is warm and dry. No rash noted. He is not diaphoretic. No erythema. No pallor.  Psychiatric: He has a normal mood and affect. His behavior is normal. Judgment and thought content normal.     EKG: Sinus  Bradycardia  -RSR(V1) -nondiagnostic.   Low voltage with rightward P-axis and rotation -possible pulmonary disease.   ABNORMAL       ASSESSMENT AND PLAN

## 2014-09-10 NOTE — Assessment & Plan Note (Signed)
He is back into normal sinus rhythm. Continue current dose of Toprol as well as anticoagulation with Eliquis.

## 2014-09-10 NOTE — Patient Instructions (Signed)
Continue same medications.   Your physician wants you to follow-up in: 6 months.  You will receive a reminder letter in the mail two months in advance. If you don't receive a letter, please call our office to schedule the follow-up appointment.  

## 2014-09-10 NOTE — Assessment & Plan Note (Signed)
He has no symptoms of angina. Continue medical therapy. 

## 2014-10-19 ENCOUNTER — Other Ambulatory Visit: Payer: Self-pay | Admitting: *Deleted

## 2014-10-19 MED ORDER — NITROGLYCERIN 0.4 MG SL SUBL: SUBLINGUAL_TABLET | SUBLINGUAL | Status: DC

## 2014-11-01 DIAGNOSIS — N183 Chronic kidney disease, stage 3 unspecified: Secondary | ICD-10-CM | POA: Insufficient documentation

## 2014-11-01 DIAGNOSIS — K219 Gastro-esophageal reflux disease without esophagitis: Secondary | ICD-10-CM | POA: Insufficient documentation

## 2014-11-01 DIAGNOSIS — N401 Enlarged prostate with lower urinary tract symptoms: Secondary | ICD-10-CM | POA: Insufficient documentation

## 2014-11-02 NOTE — Consult Note (Signed)
Brief Consult Note: Diagnosis: atypical chest pain likely GERD.   Patient was seen by consultant.   Consult note dictated.   Discussed with Attending MD.   Comments: Known history of CAD, Symptoms are very different from prior angina and seems to be due to GERD.  Normal ECG. CEs are negative.  OK to discharge home.  Will arrange for outpatient stress test.  Electronic Signatures: Kathlyn Sacramento (MD)  (Signed 29-Mar-14 13:25)  Authored: Brief Consult Note   Last Updated: 29-Mar-14 13:25 by Kathlyn Sacramento (MD)

## 2014-11-02 NOTE — H&P (Signed)
PATIENT NAME:  Benjamin Brewer, Benjamin Brewer MR#:  [id number] DATE OF BIRTH:  1930-11-06  DATE OF ADMISSION:  10/07/2012  CHIEF COMPLAINT:  Chest and epigastric pain starting at 6:30.   REFERRING PHYSICIAN:  Loletta Specter, MD.   PRIMARY CARE PHYSICIAN:  Apolonio Schneiders, MD.   PRIMARY CARDIOLOGIST:  At [facility], Dr. Verl Blalock at [place].   HISTORY OF PRESENT ILLNESS:  The patient is a 81 year old gentleman with history of coronary artery disease status post 2 stents, hypertension, hyperlipidemia, anemia of chronic disease, BPH and GERD, comes with a history of chest pain that started at 6:30.  The patient came home and started to do some wrecking of some dirt on the patio and soon after he started doing this he had significant chest pain.    The chest pain is mostly epigastric.  The patient points at the union in between the chest and the abdomen, mostly around the xiphoid process.  He feels like it is something pulling from the inside like a muscle pull.  His intensity was 5 out of 10 and it started to get better before he came to the ER and relieved completely after nitroglycerin was given over here.  He lasted for what about 1 hour and 30 minutes.  No radiation to the arm, jaw or back and he never had any retrosternal pain.  He states that he had a large amount of barbecue today that could be a cause of the problem as his son has similar symptoms.   He had mild nausea, but no shortness of breath.  The patient admitted for evaluation.  His cardiac enzymes are negative.  His EKG has no signs of ischemia, normal sinus rhythm.  No ST depression or elevation.   REVIEW OF SYSTEMS:  CONSTITUTIONAL:  No fever, fatigue, weight loss or weight gain.  EYES:  No blurry vision, double vision.  EARS, NOSE, THROAT:  No difficulty swallowing.  No sinus pain.  No nasal discharge.  RESPIRATIONS:  No cough, wheezing.  No COPD or painful respiration.  CARDIOVASCULAR:  Positive chest pain.  No orthopnea, no edema, no  arrhythmia, no palpitations, no syncope.  GASTROINTESTINAL:  No nausea, vomiting, or abdominal pain.  Prior to this he only had nausea temporarily during the pain, but it is resolved now, no rectal bleeding, no melena.  GENITOURINARY:  No dysuria, hematuria or changes in frequency.  No prostatitis.  ENDOCRINE:  No polyuria, polydipsia, polyphagia, cold or heat intolerance.   HEMATOLOGIC AND LYMPHATIC:  No anemia, easy bruising or swollen glands.  SKIN:  Without any rashes or lesions.  MUSCULOSKELETAL:  No neck pain, back pain or gout.  NEUROLOGIC:  No numbness, tingling, CVAs or TIAs.  PSYCHIATRIC:  No insomnia, depression or anxiety.   PAST MEDICAL HISTORY: 1.  Coronary artery disease.  2.  Hypertension.  3.  Hyperlipidemia.  4.  Macular degeneration.  5.  Gastroesophageal reflux disease.  6.  Benign prostatic hypertrophy.  7.  Anemia of chronic disease.   PAST SURGICAL HISTORY:  1.  Two stents.  2.  Multiple catheterizations.  3.  Hernia repair.   4.  Also had a bowel surgery that he does not know  when or why.   ALLERGIES:  THE PATIENT IS ALLERGIC TO HYTRIN, GIVES HIM MUSCLE ACHES.   FAMILY HISTORY:  Positive for myocardial infarction in his mother.  His mother also had a CVA.  No history of cancer in the family.   SOCIAL HISTORY:  The patient does not  smoke.  He drinks very occasionally.  He does not use drugs.  He is retired.  He lives with his wife.    MEDICATIONS:  Aspirin 81 mg once daily, diltiazem ER 180 mg once daily, ICaps once daily, Lipitor 20 mg once daily, metoprolol 25 mg XL once daily, Niaspan 500 mg ER once daily, Nitrostat 0.4 sublingual as needed, Plavix 75 mg once daily, Protonix 40 mg once daily, slow iron once a day and Uroxatral 10 mg daily.   PHYSICAL EXAMINATION: VITAL SIGNS:  Blood pressure of 110/54, pulse 70, respirations 18, temperature 97.7.  Oxygen saturation above 97% on room air, but he has been put on oxygen 99% right now.  GENERAL:  Alert and  oriented x 3.  No acute distress.  No respiratory distress.  Hemodynamically stable.  HEENT:  Pupils are equal and reactive.  Extraocular movements intact.  Mucosa are moist.  Anicteric sclerae.  Pink conjunctivae.  No oral lesions.  NECK:  Supple.  No JVD.  No thyromegaly.  No adenopathy.  No carotid bruits.  No masses.  No lesions.  No rigidity.  CARDIOVASCULAR:  Regular rate and rhythm.  No murmurs, rubs, or gallops.  No reproduction of pain with palpation of the chest or abdomen.  The patient is free of pain right now.  No displacement of PMI.  LUNGS:  Clear without any wheezing or crepitus.  No use of accessory muscles.  ABDOMEN:  Soft, nontender, nondistended.  No hepatosplenomegaly.  No masses.  Bowel sounds are positive.  No reproduction of pain.  No rebound.  GENITAL:  Deferred.  EXTREMITIES:  No edema, no cyanosis, no clubbing.  NEUROLOGIC:  Cranial nerves II through XII intact.  No focal findings.  Mood is normal without any signs of depression or agitation.  Alert, oriented x 3.  MUSCULOSKELETAL:  No joint lesions or joint effusions.  SKIN:  Without any rashes or petechiae.  LYMPHATIC:  Negative for lymphadenopathy in neck or supraclavicular areas.   LABORATORY, DIAGNOSTIC AND RADIOLOGICAL DATA:  Cardiac enzymes so far are negative.  Creatinine is 0.58, BUN 26, glucose 105, AST of 83.  Troponin is 0.02.  White count is 9.1, hemoglobin is 12.6, platelets 182.   EKG:  No ST depression or elevation.   ASSESSMENT AND PLAN:  A 81 year old gentleman with history of coronary artery disease, status post two stents, hypertension, hyperlipidemia, benign prostatic hypertrophy, comes with chest pain after activity.   1.  Chest pain, mostly atypical, rather epigastric than retrosternal, but patient has significant risk factors and he had two stents.  He has not seen his cardiologist in a while.  His last Myoview scan has been over two years apparently, the patient is not quite sure.  We are going to  consult cardiology to get his records.  I am not going to order a Myoview right now.  I am going to let cardiology decide if he really needs one as the pain is so atypical.  Cardiac enzymes every 8 hours.  As needed nitroglycerin.  Continue beta-blocker.  Continue aspirin and heparin only at prophylactic dose.  The patient is stable.  Blood pressure is stable.  Heart rate is stable.  At this moment he could just be admitted for observation and possibly discharge tomorrow and he could have a follow-up stress test on [facility] on Monday as we do not do Myoviews over the weekend.  Continue to monitor under telemetry closely.  2.  Hypertension.  Continue treatment with metoprolol  and diltiazem.  Please continue Plavix for coronary artery disease.  3.  Dyslipidemia.  Lipitor and Nitrostat.  Recheck lipid panel.  4.  Benign prostatic hypertrophy.  At this moment asymptomatic.  No changes in therapy.  5.  Other medical problems seems to be stable.  6.  THE PATIENT IS A FULL CODE.  7.  Deep vein thrombosis prophylaxis with heparin and gastrointestinal prophylaxis with proton pump inhibitor what he needs in case this is gastrointestinal upset.   TIME SPENT:  I spent about 45 minutes with this patient.     ____________________________ Ridgely Sink, MD rsg:ea D: 10/07/2012 23:06:20 ET T: 10/07/2012 23:31:52 ET JOB#: [id number]  cc: [place] Sink, MD, <Dictator> Brendt Dible America Brown MD ELECTRONICALLY SIGNED 10/19/2012 13:34

## 2014-11-02 NOTE — H&P (Signed)
PATIENT NAME:  SANAV, REMER MR#:  [id number] DATE OF BIRTH:  26-Aug-1930  DATE OF ADMISSION:  10/07/2012  Addendum  Please add on to problem list:  Chronic kidney disease with acute kidney disease, at this moment, acute on chronic.  The patient has elevation of his creatinine from baseline, about three points.  IV fluids given slowly for 1 liter.  Monitor kidney function and recheck creatinine in the morning.     ____________________________ [place] Sink, MD rsg:ea D: 10/07/2012 23:10:12 ET T: 10/08/2012 00:20:55 ET JOB#: [id number]  cc: [place] Sink, MD, <Dictator> Na [place] America Brown MD ELECTRONICALLY SIGNED 10/19/2012 13:34

## 2014-11-02 NOTE — Discharge Summary (Signed)
PATIENT NAME:  Benjamin Brewer, BOHLKEN MR#:  [id number] DATE OF BIRTH:  08/25/1930  DATE OF ADMISSION:  10/07/2012 DATE OF DISCHARGE:  10/08/2012  DISCHARGE DIAGNOSES: 1.  Chest pain, atypical.  2.  History of coronary artery disease and stent placement.  3.  Hypertension.  4.  Dyslipidemia.  5.  Acute renal failure, corrected.   CODE STATUS:  FULL CODE.   CONDITION ON DISCHARGE:  Stable.   MEDICATIONS ON DISCHARGE:  Aspirin 81 mg enteric-coated tablet once a day, Lipitor 20 mg once a day, Niaspan extended release 500 mg oral tablet once a day, Uroxatral 10 mg oral tablet extended-release once a day, Protonix 40 mg oral enteric-coated tablet once a day, diltiazem 180 mg extended release once a day, Plavix 75 mg once a day, Nitrostat 0.4 mg sublingual tablet every 5 minutes as needed.   DIET ON DISCHARGE:  Low-sodium, low-fat, low-cholesterol diet.   DIET CONSISTENCY:  Regular.   ACTIVITY LIMITATION:  As tolerated.   TIME FRAME FOR FOLLOW-UP:  In 2 to 4 week at [facility] as routine with Dr. Kathlyn Sacramento.   HITSORY OF PRESENT ILLNESS:  A 81 year old male with history of coronary artery disease status post 2 stents, hypertension, hyperlipidemia, anemia of chronic disease, benign prostatic hypertrophy, gastroesophageal reflux disease, came with history of chest pain that started at 6:30.  The patient was at home, started to do some raking of his dirt on the patio and soon  after that he started having this significant chest pain.  The pain was mostly epigastric.  He felt it like a muscle pulled inside.  Intensity was 5 out of 10 and it started to get better before he came to Emergency Room and relieved completely after getting nitroglycerin in the ER.  Lasted for about 1 hour and 30 minutes.  No radiation to arm, jaw or back.  Never had retrosternal pain.  He had mild nausea, but no shortness of breath with the pain.  Admitted for evaluation of his cardiac enzymes.  EKG was normal  sinus rhythm.  No ST-T changes on presentation.   HOSPITAL COURSE AND STAY:   1.  There was no more episode of chest pain in the hospital.  It was mostly kind of epigastric on presentation, so we thought it is very atypical for chest pain of cardiac origin.  Two troponins were negative.  Cardiologist Dr. Fletcher Anon saw him and suggested to follow in the clinic and did not suggest any further work-up from cardiac point of view and so we discharged him home.  2.  History of coronary artery disease and stent.  We continued atorvastatin, aspirin, Plavix and diltiazem.   3.  Hypertension.  We will continue diltiazem and remain stable.  4.  Dyslipidemia.  Resume atorvastatin.  5.  Acute renal failure presented on admission and corrected with IV fluid.   IMPORTANT LABORATORY DATA IN THE HOSPITAL:  On presentation, creatinine was 1.58, on discharge 1.25.  Potassium was 3.9 and stable.  Lipase was 112.  All 3 troponin was less than 0.02.  Hemoglobin was 12.6 and stable.  X-ray chest portable, no evidence of acute cardiopulmonary disease.   TOTAL TIME SPENT IN THIS DISCHARGE:  45 minutes.     ____________________________ Ceasar Lund Anselm Jungling, MD vgv:ea D: 10/11/2012 22:15:34 ET T: 10/12/2012 04:26:31 ET JOB#: [id number]  cc: Ceasar Lund. Anselm Jungling, MD, <Dictator> Muhammad A. Fletcher Anon, MD Vaughan Basta MD ELECTRONICALLY SIGNED 10/20/2012 14:30

## 2014-11-02 NOTE — Consult Note (Signed)
General Aspect Primary cardiologist: Dr. Verl Blalock.  The patient is a 81 year old gentleman with history of coronary artery disease status post 2 drug eluting stents to LAD and RCA in 2009, hypertension, hyperlipidemia, anemia of chronic disease, BPH and GERD.  He presented with a prolonged episode of mostly epigastric pain which happened after eating barbicue and a hot dog. However, it was relieved by NTG in the ED. Has been chest pain free since then. ECG is normal and enzymes are negative.  No recent exertional chest pain or dyspnea. Current symptoms are different from prior angina which was mostly exertional dyspnea.   Physical Exam:  GEN no acute distress   NECK supple    RESP normal resp effort  clear BS    CARD Regular rate and rhythm  No murmur    ABD denies tenderness    LYMPH negative neck   EXTR negative cyanosis/clubbing, negative edema   SKIN normal to palpation   PSYCH alert, A+O to time, place, person   Review of Systems:  Subjective/Chief Complaint chest/epigastic pain.   General: No Complaints    Skin: No Complaints    ENT: No Complaints    Eyes: No Complaints    Neck: No Complaints    Respiratory: No Complaints    Cardiovascular: Chest pain or discomfort    Gastrointestinal: Heartburn    Genitourinary: No Complaints    Vascular: No Complaints    Musculoskeletal: No Complaints    Neurologic: No Complaints    Hematologic: No Complaints        Coronary Artery Disease:    Hypertension:    angina:    enlarged prostate:    tachycardia:    Cardiac Stents:   Home Medications:  Medication Instructions Status  Protonix 40 mg oral enteric coated tablet   once a day (at bedtime)  Active  Plavix 75 mg oral tablet   once a day   Active  Uroxatral 10 mg oral tablet, extended release   once a day  Active  Niaspan ER 500 mg oral tablet, extended release   once a day at hs Active  Lipitor 20 mg oral tablet   once a day at hs Active  aspirin 81  mg oral enteric coated tablet   once a day at hs stopped before surgery Active  diltiazen 24 ER 167m 1 tab(s)  once a day (at bedtime)  Active  I caps 1 tab(s)  once a day Active  Nitrostat 0.4 mg sublingual tablet 1 tab(s) sublingual every 5 minutes, As Needed Active   Lab Results:  Routine Chem:  29-Mar-14 05:54   Cholesterol, Serum 115  Triglycerides, Serum 75  HDL (INHOUSE) 43  VLDL Cholesterol Calculated 15  LDL Cholesterol Calculated 57 (Result(s) reported on 08 Oct 2012 at 07:10AM.)  Glucose, Serum 94  BUN  23  Creatinine (comp) 1.25  Sodium, Serum 139  Potassium, Serum 3.8  Chloride, Serum  108  CO2, Serum 27  Calcium (Total), Serum 8.8  Anion Gap  4  Osmolality (calc) 281  eGFR (African American) >60  eGFR (Non-African American)  54 (eGFR values <622mmin/1.73 m2 may be an indication of chronic kidney disease (CKD). Calculated eGFR is useful in patients with stable renal function. The eGFR calculation will not be reliable in acutely ill patients when serum creatinine is changing rapidly. It is not useful in  patients on dialysis. The eGFR calculation may not be applicable to patients at the low and high extremes of body sizes, pregnant  women, and vegetarians.)  Cardiac:  29-Mar-14 05:54   CK, Total 101  CPK-MB, Serum 0.7 (Result(s) reported on 08 Oct 2012 at 07:18AM.)  Troponin I < 0.02 (0.00-0.05 0.05 ng/mL or less: NEGATIVE  Repeat testing in 3-6 hrs  if clinically indicated. >0.05 ng/mL: POTENTIAL  MYOCARDIAL INJURY. Repeat  testing in 3-6 hrs if  clinically indicated. NOTE: An increase or decrease  of 30% or more on serial  testing suggests a  clinically important change)    12:59   CK, Total 93  CPK-MB, Serum 0.8 (Result(s) reported on 08 Oct 2012 at 01:38PM.)  Troponin I < 0.02 (0.00-0.05 0.05 ng/mL or less: NEGATIVE  Repeat testing in 3-6 hrs  if clinically indicated. >0.05 ng/mL: POTENTIAL  MYOCARDIAL INJURY. Repeat  testing in 3-6 hrs if   clinically indicated. NOTE: An increase or decrease  of 30% or more on serial  testing suggests a  clinically important change)  Routine Hem:  29-Mar-14 05:54   WBC (CBC) 7.2  RBC (CBC)  3.86  Hemoglobin (CBC)  11.7  Hematocrit (CBC)  34.7  Platelet Count (CBC) 172  MCV 90  MCH 30.3  MCHC 33.7  RDW 13.4  Neutrophil % 77.8  Lymphocyte % 8.8  Monocyte % 10.1  Eosinophil % 2.6  Basophil % 0.7  Neutrophil # 5.6  Lymphocyte #  0.6  Monocyte # 0.7  Eosinophil # 0.2  Basophil # 0.1 (Result(s) reported on 08 Oct 2012 at 07:07AM.)   EKG:  EKG Interp. by me  Nml  NSR    Radiology Results:  XRay:    28-Mar-14 19:46, Chest Portable Single View  Chest Portable Single View   REASON FOR EXAM:    Chest Pain  COMMENTS:       PROCEDURE: DXR - DXR PORTABLE CHEST SINGLE VIEW  - Oct 07 2012  7:46PM     RESULT: The lungs are clear. The cardiac silhouette and visualized bony   skeleton are unremarkable.    IMPRESSION:      1. Chest radiograph without evidence of acute cardiopulmonary disease.        Verified By: Mikki Santee, M.D., MD    Hytrin: Other  Vital Signs/Nurse's Notes:  **Vital Signs.:   29-Mar-14 05:15  Vital Signs Type Routine  Temperature Temperature (F) 98  Celsius 36.6  Temperature Source oral  Pulse Pulse 53  Respirations Respirations 18  Systolic BP Systolic BP 595  Diastolic BP (mmHg) Diastolic BP (mmHg) 63  Mean BP 78  Pulse Ox % Pulse Ox % 98  Pulse Ox Activity Level  At rest  Oxygen Delivery Room Air/ 21 %    13:52  Vital Signs Type Q 4hr  Celsius 36.8  Temperature Source oral  Pulse Pulse 52  Respirations Respirations 16  Systolic BP Systolic BP 638  Diastolic BP (mmHg) Diastolic BP (mmHg) 64  Mean BP 80  Pulse Ox % Pulse Ox % 97  Pulse Ox Activity Level  At rest  Oxygen Delivery Room Air/ 21 %    Impression 1. Atypical chest/epigastic pain: likely due to GERD.  2. Known CAD s/p DES to LAD and RCA in 2009. No recent stress testing.   3. Hypertension: contorlled.  4. Mild ARF resolved with hydration.   Plan His symptoms are likely due to GERD. He was supposed to be on Protonix but has not been taking it.  He is chest pain free. ECG and enzymes are unremarkable.  OK to discharge home on a  PPI.  I will schedule him for an outpatient stress test.  Continue current cardiac meds.   Electronic Signatures: Kathlyn Sacramento (MD)  (Signed 30-Mar-14 09:29)  Authored: General Aspect/Present Illness, History and Physical Exam, Review of System, Past Medical History, Home Medications, Labs, EKG , Radiology, Allergies, Vital Signs/Nurse's Notes, Impression/Plan   Last Updated: 30-Mar-14 09:29 by Kathlyn Sacramento (MD)

## 2014-12-13 ENCOUNTER — Other Ambulatory Visit: Payer: Self-pay | Admitting: Cardiovascular Disease

## 2015-03-17 ENCOUNTER — Other Ambulatory Visit: Payer: Self-pay | Admitting: Cardiovascular Disease

## 2015-03-28 ENCOUNTER — Encounter: Payer: Self-pay | Admitting: Cardiovascular Disease

## 2015-03-28 ENCOUNTER — Ambulatory Visit (INDEPENDENT_AMBULATORY_CARE_PROVIDER_SITE_OTHER): Payer: PPO | Admitting: Cardiovascular Disease

## 2015-03-28 VITALS — BP 122/60 | HR 50 | Ht 71.0 in | Wt 202.5 lb

## 2015-03-28 DIAGNOSIS — E785 Hyperlipidemia, unspecified: Secondary | ICD-10-CM

## 2015-03-28 DIAGNOSIS — I4891 Unspecified atrial fibrillation: Secondary | ICD-10-CM

## 2015-03-28 DIAGNOSIS — I2510 Atherosclerotic heart disease of native coronary artery without angina pectoris: Secondary | ICD-10-CM

## 2015-03-28 DIAGNOSIS — R001 Bradycardia, unspecified: Secondary | ICD-10-CM | POA: Diagnosis not present

## 2015-03-28 DIAGNOSIS — I10 Essential (primary) hypertension: Secondary | ICD-10-CM

## 2015-03-28 NOTE — Assessment & Plan Note (Signed)
Recommended he increase his Lipitor up to a full dose He reports he is current taking a half dose (he believes it is a 40 mg pill cut in half). He will confirm the dose at home LDL slightly above goal

## 2015-03-28 NOTE — Patient Instructions (Signed)
You are doing well.  Check the dose of the atorvastatin If you are taking a 1/2 pill, increase to a whole Cholesterol is slightly high  Please monitor the heart rate If it gets into the 40s and you are tired, call the office  Please call us if you have new issues that need to be addressed before your next appt.  Your physician wants you to follow-up in: 6 months with Dr Zannie Cove will receive a reminder letter in the mail two months in advance. If you don't receive a letter, please call our office to schedule the follow-up appointment.

## 2015-03-28 NOTE — Assessment & Plan Note (Signed)
Long discussion concerning his bradycardia. He prefers not to change the medications as he is asymptomatic. Recommended he periodically monitor his heart rate, especially for symptoms.  If he does develop heart rate into the 40s, could decrease the Toprol back to 25 mg daily

## 2015-03-28 NOTE — Progress Notes (Signed)
Patient ID: Benjamin Brewer, male    DOB: 12-05-1930, 81 y.o.   MRN: [id number]  HPI Comments: Benjamin Brewer is a pleasant 81 year old male with a history of coronary artery disease, paroxysmal atrial fibrillation. He presents for routine follow-up of above  In follow-up, he reports no tachycardia or palpitations concerning for atrial fibrillation. He is been taking anticoagulation/eliquis 5 mg twice a day with metoprolol succinate 50 mg daily with no symptoms or side effects. Active, continues to work in Personal assistant. He does go to the gym, does aerobic and light weights Denies any symptoms concerning for angina. He denies having any symptoms from his slow heart rate  EKG on today's visit shows normal sinus rhythm with rate 50 bpm, no significant ST or T-wave changes  Other past medical history  status post angioplasty and drug-eluting stent placement to the LAD and RCA in 2009.    He was hospitalized at [facility] in 2013 for atypical chest pain.   outpatient nuclear stress test which showed no evidence of ischemia with normal ejection fraction.    bradycardic in the past with a heart rate of 48 beats per minute. Diltiazem was discontinued, increased Toprol to 25 mg daily and added amlodipine 5 mg once daily.   Seen later for atrial fibrillation in the emergency room, Toprol increased up to 50 mg, amlodipine held, started on anticoagulation  Previous echocardiogram which showed an ejection fraction of 55-60%, mild mitral regurgitation and mildly dilated right atrium.    Allergies  Allergen Reactions  . Terazosin     Other reaction(s): Muscle Pain    Current Outpatient Prescriptions on File Prior to Visit  Medication Sig Dispense Refill  . alfuzosin (UROXATRAL) 10 MG 24 hr tablet Take 10 mg by mouth daily.      Marland Kitchen atorvastatin (LIPITOR) 20 MG tablet Take 20 mg by mouth daily.    . cyanocobalamin 500 MCG tablet Take 500 mcg by mouth daily.    Marland Kitchen ELIQUIS 5 MG TABS tablet take 1 tablet by  mouth twice a day 60 tablet 3  . ferrous sulfate (FERRO-BOB) 325 (65 FE) MG tablet Take 325 mg by mouth daily.      . finasteride (PROSCAR) 5 MG tablet Take 5 mg by mouth daily.    . fluticasone (FLONASE) 50 MCG/ACT nasal spray Place 1-2 sprays into both nostrils daily. (Patient taking differently: Place 1-2 sprays into both nostrils as needed. ) 16 g 2  . metoprolol succinate (TOPROL-XL) 50 MG 24 hr tablet Take 1 tablet (50 mg total) by mouth daily. Take with or immediately following a meal. 30 tablet 6  . Multiple Vitamins-Minerals (EYE VITAMINS PO) Take by mouth daily.    . nitroGLYCERIN (NITROSTAT) 0.4 MG SL tablet place 1 tablet under the tongue if needed every 5 minutes for chest pain for 3 doses IF NO RELIEF AFTER 3RD DOSE CALL PRESCRIBER OR 911. 25 tablet 3  . pantoprazole (PROTONIX) 40 MG tablet take 1 tablet by mouth once daily 30 tablet 3  . RAPAFLO 8 MG CAPS capsule Take 8 mg by mouth daily with breakfast.      No current facility-administered medications on file prior to visit.    Past Medical History  Diagnosis Date  . Other dyspnea and respiratory abnormality     on exertion  . Intermediate coronary syndrome     angina, unstable  . Dyslipidemia   . Benign prostatic hypertrophy     Hx of   . Hypertension   .  Hyperlipidemia   . History of tachycardia   . CAD (coronary artery disease)     native vessel. Status post LAD and RCA angioplasty and drug-eluting stent placement in 2009    Past Surgical History  Procedure Laterality Date  . Cardiac catheterization      [place]; x2  . Cardiac catheterization      [facility]  . Cardiac catheterization      [facility]  . Tonsillectomy      Social History  reports that he has never smoked. He has never used smokeless tobacco. He reports that he drinks alcohol. He reports that he does not use illicit drugs.  Family History family history includes Heart attack in his mother; Hypertension in his mother; Stroke in his mother  and another family member.   Review of Systems  Constitutional: Negative.   Respiratory: Negative.   Cardiovascular: Negative.   Gastrointestinal: Negative.   Musculoskeletal: Negative.   Skin: Negative.   Neurological: Negative.   Hematological: Negative.   Psychiatric/Behavioral: Negative.   All other systems reviewed and are negative.   BP 122/60 mmHg  Pulse 50  Ht 5\' 11"  (1.803 m)  Wt 202 lb 8 oz (91.853 kg)  BMI 28.26 kg/m2  Physical Exam  Constitutional: He is oriented to person, place, and time. He appears well-developed and well-nourished.  HENT:  Head: Normocephalic.  Nose: Nose normal.  Mouth/Throat: Oropharynx is clear and moist.  Eyes: Conjunctivae are normal. Pupils are equal, round, and reactive to light.  Neck: Normal range of motion. Neck supple. No JVD present.  Cardiovascular: Regular rhythm, normal heart sounds and intact distal pulses.  Bradycardia present.  Exam reveals no gallop and no friction rub.   No murmur heard. Pulmonary/Chest: Effort normal and breath sounds normal. No respiratory distress. He has no wheezes. He has no rales. He exhibits no tenderness.  Abdominal: Soft. Bowel sounds are normal. He exhibits no distension. There is no tenderness.  Musculoskeletal: Normal range of motion. He exhibits no edema or tenderness.  Lymphadenopathy:    He has no cervical adenopathy.  Neurological: He is alert and oriented to person, place, and time. Coordination normal.  Skin: Skin is warm and dry. No rash noted. No erythema.  Psychiatric: He has a normal mood and affect. His behavior is normal. Judgment and thought content normal.

## 2015-03-28 NOTE — Assessment & Plan Note (Signed)
Recommended that he stay on his current dose of metoprolol and anticoagulation He is asymptomatic and feels well.

## 2015-03-28 NOTE — Assessment & Plan Note (Signed)
Currently with no symptoms of angina. No further workup at this time. Continue current medication regimen. 

## 2015-03-28 NOTE — Assessment & Plan Note (Signed)
Blood pressure is well controlled on today's visit. No changes made to the medications. 

## 2015-04-12 ENCOUNTER — Other Ambulatory Visit: Payer: Self-pay | Admitting: Cardiovascular Disease

## 2015-05-07 ENCOUNTER — Other Ambulatory Visit: Payer: Self-pay | Admitting: Physician Assistant

## 2015-05-07 DIAGNOSIS — R1319 Other dysphagia: Secondary | ICD-10-CM

## 2015-05-13 ENCOUNTER — Ambulatory Visit
Admission: RE | Admit: 2015-05-13 | Discharge: 2015-05-13 | Disposition: A | Discharge status: Home / Self Care | Payer: PPO | Source: Ambulatory Visit | Attending: Physician Assistant | Admitting: Physician Assistant

## 2015-05-13 DIAGNOSIS — K449 Diaphragmatic hernia without obstruction or gangrene: Secondary | ICD-10-CM | POA: Diagnosis not present

## 2015-05-13 DIAGNOSIS — R1310 Dysphagia, unspecified: Secondary | ICD-10-CM | POA: Diagnosis not present

## 2015-05-13 DIAGNOSIS — K228 Other specified diseases of esophagus: Secondary | ICD-10-CM | POA: Diagnosis not present

## 2015-05-13 DIAGNOSIS — R1319 Other dysphagia: Secondary | ICD-10-CM

## 2015-05-20 ENCOUNTER — Telehealth: Payer: Self-pay

## 2015-05-20 NOTE — Telephone Encounter (Signed)
Clearance faxed to [facility], [phone]

## 2015-05-20 NOTE — Telephone Encounter (Signed)
Cardiac clearance faxed to [facility], [phone] for 11/28 EGD.  S/w pt of recommendations to stop Eliquis 2 days prior to procedure. Pt verbalized understanding.

## 2015-05-23 ENCOUNTER — Telehealth: Payer: Self-pay

## 2015-05-23 NOTE — Telephone Encounter (Signed)
Per Anderson Malta at [facility] request, re-faxed cardiac clearance.

## 2015-06-05 ENCOUNTER — Encounter: Payer: Self-pay | Admitting: *Deleted

## 2015-06-05 ENCOUNTER — Ambulatory Visit: Payer: PPO | Admitting: Physician Assistant

## 2015-06-10 ENCOUNTER — Ambulatory Visit: Payer: PPO | Admitting: Anesthesiology

## 2015-06-10 ENCOUNTER — Encounter: Payer: Self-pay | Admitting: Anesthesiology

## 2015-06-10 ENCOUNTER — Ambulatory Visit
Admission: RE | Admit: 2015-06-10 | Discharge: 2015-06-10 | Disposition: A | Discharge status: Home / Self Care | Payer: PPO | Source: Ambulatory Visit | Attending: Unknown Physician Specialty | Admitting: Unknown Physician Specialty

## 2015-06-10 ENCOUNTER — Encounter
Admission: RE | Disposition: A | Discharge status: Home / Self Care | Payer: Self-pay | Source: Ambulatory Visit | Attending: Unknown Physician Specialty

## 2015-06-10 DIAGNOSIS — Z7951 Long term (current) use of inhaled steroids: Secondary | ICD-10-CM | POA: Insufficient documentation

## 2015-06-10 DIAGNOSIS — Z79899 Other long term (current) drug therapy: Secondary | ICD-10-CM | POA: Diagnosis not present

## 2015-06-10 DIAGNOSIS — Z955 Presence of coronary angioplasty implant and graft: Secondary | ICD-10-CM | POA: Insufficient documentation

## 2015-06-10 DIAGNOSIS — N400 Enlarged prostate without lower urinary tract symptoms: Secondary | ICD-10-CM | POA: Insufficient documentation

## 2015-06-10 DIAGNOSIS — K222 Esophageal obstruction: Secondary | ICD-10-CM | POA: Diagnosis not present

## 2015-06-10 DIAGNOSIS — I10 Essential (primary) hypertension: Secondary | ICD-10-CM | POA: Insufficient documentation

## 2015-06-10 DIAGNOSIS — Z7901 Long term (current) use of anticoagulants: Secondary | ICD-10-CM | POA: Diagnosis not present

## 2015-06-10 DIAGNOSIS — E785 Hyperlipidemia, unspecified: Secondary | ICD-10-CM | POA: Diagnosis not present

## 2015-06-10 DIAGNOSIS — R1310 Dysphagia, unspecified: Secondary | ICD-10-CM | POA: Diagnosis present

## 2015-06-10 DIAGNOSIS — Z888 Allergy status to other drugs, medicaments and biological substances status: Secondary | ICD-10-CM | POA: Insufficient documentation

## 2015-06-10 DIAGNOSIS — I2510 Atherosclerotic heart disease of native coronary artery without angina pectoris: Secondary | ICD-10-CM | POA: Diagnosis not present

## 2015-06-10 DIAGNOSIS — K449 Diaphragmatic hernia without obstruction or gangrene: Secondary | ICD-10-CM | POA: Diagnosis not present

## 2015-06-10 HISTORY — PX: SAVORY DILATION: SHX5439

## 2015-06-10 HISTORY — PX: ESOPHAGOGASTRODUODENOSCOPY (EGD) WITH PROPOFOL: SHX5813

## 2015-06-10 SURGERY — ESOPHAGOGASTRODUODENOSCOPY (EGD) WITH PROPOFOL
Anesthesia: General

## 2015-06-10 MED ORDER — GLYCOPYRROLATE 0.2 MG/ML IJ SOLN
INTRAMUSCULAR | Status: DC | PRN
  Administered 2015-06-10: 0.2 mg via INTRAVENOUS

## 2015-06-10 MED ORDER — FENTANYL CITRATE (PF) 100 MCG/2ML IJ SOLN
INTRAMUSCULAR | Status: DC | PRN
  Administered 2015-06-10: 25 ug via INTRAVENOUS

## 2015-06-10 MED ORDER — SODIUM CHLORIDE 0.9 % IV SOLN
INTRAVENOUS | Status: DC
  Administered 2015-06-10: 1000 mL via INTRAVENOUS

## 2015-06-10 MED ORDER — SODIUM CHLORIDE 0.9 % IV SOLN: INTRAVENOUS | Status: DC

## 2015-06-10 MED ORDER — PROPOFOL 10 MG/ML IV BOLUS
INTRAVENOUS | Status: DC | PRN
  Administered 2015-06-10: 80 mg via INTRAVENOUS
  Administered 2015-06-10: 20 mg via INTRAVENOUS

## 2015-06-10 NOTE — H&P (Signed)
Primary Care Physician:  Marcello Fennel, MD Primary Gastroenterologist:  Dr. Vira Agar  Pre-Procedure History & Physical: HPI:  Benjamin Brewer is a 81 y.o. male is here for an endoscopy.   Past Medical History  Diagnosis Date  . Other dyspnea and respiratory abnormality     on exertion  . Intermediate coronary syndrome (HCC)     angina, unstable  . Dyslipidemia   . Benign prostatic hypertrophy     Hx of   . Hypertension   . Hyperlipidemia   . History of tachycardia   . CAD (coronary artery disease)     native vessel. Status post LAD and RCA angioplasty and drug-eluting stent placement in 2009    Past Surgical History  Procedure Laterality Date  . Cardiac catheterization      ; x2  . Cardiac catheterization      [facility]  . Cardiac catheterization      [facility]  . Tonsillectomy    . Hernia repair      Prior to Admission medications   Medication Sig Start Date End Date Taking? Authorizing Provider  alfuzosin (UROXATRAL) 10 MG 24 hr tablet Take 10 mg by mouth daily.     Yes Historical Provider, MD  atorvastatin (LIPITOR) 20 MG tablet Take 20 mg by mouth daily.   Yes Historical Provider, MD  cyanocobalamin 500 MCG tablet Take 500 mcg by mouth daily.   Yes Historical Provider, MD  ELIQUIS 5 MG TABS tablet take 1 tablet by mouth twice a day 03/19/15  Yes Wellington Hampshire, MD  finasteride (PROSCAR) 5 MG tablet Take 5 mg by mouth daily.   Yes Historical Provider, MD  fluticasone (FLONASE) 50 MCG/ACT nasal spray Place 1-2 sprays into both nostrils daily. Patient taking differently: Place 1-2 sprays into both nostrils as needed.  10/23/13  Yes Roger A Arguello, PA-C  metoprolol succinate (TOPROL-XL) 50 MG 24 hr tablet Take 1 tablet (50 mg total) by mouth daily. Take with or immediately following a meal. 08/20/14  Yes Wellington Hampshire, MD  pantoprazole (PROTONIX) 40 MG tablet take 1 tablet by mouth once daily 04/12/15  Yes Minna Merritts, MD  RAPAFLO 8 MG CAPS capsule  Take 8 mg by mouth daily with breakfast.  08/10/13  Yes Historical Provider, MD  ferrous sulfate (FERRO-BOB) 325 (65 FE) MG tablet Take 325 mg by mouth daily.      Historical Provider, MD  Multiple Vitamins-Minerals (EYE VITAMINS PO) Take by mouth daily.    Historical Provider, MD  nitroGLYCERIN (NITROSTAT) 0.4 MG SL tablet place 1 tablet under the tongue if needed every 5 minutes for chest pain for 3 doses IF NO RELIEF AFTER 3RD DOSE CALL PRESCRIBER OR 911. 10/19/14   Wellington Hampshire, MD    Allergies as of 05/16/2015 - Review Complete 03/28/2015  Allergen Reaction Noted  . Terazosin  08/20/2014    Family History  Problem Relation Age of Onset  . Stroke Mother   . Heart attack Mother   . Hypertension Mother   . Stroke      family history    Social History   Social History  . Marital Status: Married    Spouse Name: N/A  . Number of Children: N/A  . Years of Education: N/A   Occupational History  . Not on file.   Social History Main Topics  . Smoking status: Never Smoker   . Smokeless tobacco: Never Used  . Alcohol Use: Yes  Comment: occasional   . Drug Use: No  . Sexual Activity: Not on file   Other Topics Concern  . Not on file   Social History Narrative   Married, retired 12/95. Gets regular exercise.     Review of Systems: See HPI, otherwise negative ROS  Physical Exam: BP 173/63 mmHg  Pulse 63  Temp(Src) 97.6 F (36.4 C) (Oral)  Resp 20  Ht 5\' 11"  (1.803 m)  Wt 90.719 kg (200 lb)  BMI 27.91 kg/m2  SpO2 100% General:   Alert,  pleasant and cooperative in NAD Head:  Normocephalic and atraumatic. Neck:  Supple; no masses or thyromegaly. Lungs:  Clear throughout to auscultation.    Heart:  Regular rate and rhythm. Abdomen:  Soft, nontender and nondistended. Normal bowel sounds, without guarding, and without rebound.   Neurologic:  Alert and  oriented x4;  grossly normal neurologically.  Impression/Plan: Benjamin Brewer is here for an endoscopy to  be performed for dysphagia  Risks, benefits, limitations, and alternatives regarding  endoscopy have been reviewed with the patient.  Questions have been answered.  All parties agreeable.   Gaylyn Cheers, MD  06/10/2015, 9:33 AM

## 2015-06-10 NOTE — Transfer of Care (Signed)
Immediate Anesthesia Transfer of Care Note  Patient: Benjamin Brewer  Procedure(s) Performed: Procedure(s): ESOPHAGOGASTRODUODENOSCOPY (EGD) WITH PROPOFOL (N/A) SAVORY DILATION (N/A)  Patient Location: PACU  Anesthesia Type:General  Level of Consciousness: awake, alert , oriented and patient cooperative  Airway & Oxygen Therapy: Patient Spontanous Breathing and Patient connected to nasal cannula oxygen  Post-op Assessment: Report given to RN, Post -op Vital signs reviewed and stable and Patient moving all extremities  Post vital signs: Reviewed and stable  Last Vitals:  Filed Vitals:   06/10/15 0834 06/10/15 0950  BP: 173/63 119/56  Pulse: 63 58  Temp: 36.4 C   Resp: 20 11    Complications: No apparent anesthesia complications

## 2015-06-10 NOTE — Anesthesia Preprocedure Evaluation (Addendum)
Anesthesia Evaluation  Patient identified by MRN, date of birth, ID band Patient awake    Reviewed: Allergy & Precautions, H&P , NPO status , Patient's Chart, lab work & pertinent test results  Airway Mallampati: III  TM Distance: >3 FB Neck ROM: limited    Dental  (+) Poor Dentition, Chipped   Pulmonary neg pulmonary ROS, neg shortness of breath,    Pulmonary exam normal breath sounds clear to auscultation       Cardiovascular Exercise Tolerance: Good hypertension, (-) angina+ CAD and + Cardiac Stents  (-) DOE Normal cardiovascular exam Rhythm:regular Rate:Normal     Neuro/Psych negative neurological ROS  negative psych ROS   GI/Hepatic negative GI ROS, Neg liver ROS,   Endo/Other  negative endocrine ROS  Renal/GU negative Renal ROS  negative genitourinary   Musculoskeletal   Abdominal   Peds  Hematology negative hematology ROS (+)   Anesthesia Other Findings Past Medical History:   Other dyspnea and respiratory abnormality                      Comment:on exertion   Intermediate coronary syndrome (HCC)                           Comment:angina, unstable   Dyslipidemia                                                 Benign prostatic hypertrophy                                   Comment:Hx of    Hypertension                                                 Hyperlipidemia                                               History of tachycardia                                       CAD (coronary artery disease)                                  Comment:native vessel. Status post LAD and RCA               angioplasty and drug-eluting stent placement in              2009  Past Surgical History:   [place]; x2   CARDIAC CATHETERIZATION  Comment:[facility]   CARDIAC CATHETERIZATION                                          Comment:[facility]   TONSILLECTOMY                                                 HERNIA REPAIR                                                BMI    Body Mass Index   27.90 kg/m 2    Patient reports cardiac clearance for this procedure.    Reproductive/Obstetrics negative OB ROS                            Anesthesia Physical Anesthesia Plan  ASA: III  Anesthesia Plan: General   Post-op Pain Management:    Induction:   Airway Management Planned:   Additional Equipment:   Intra-op Plan:   Post-operative Plan:   Informed Consent: I have reviewed the patients History and Physical, chart, labs and discussed the procedure including the risks, benefits and alternatives for the proposed anesthesia with the patient or authorized representative who has indicated his/her understanding and acceptance.   Dental Advisory Given  Plan Discussed with: Anesthesiologist, CRNA and Surgeon  Anesthesia Plan Comments:        Anesthesia Quick Evaluation

## 2015-06-10 NOTE — Op Note (Signed)
[facility] Patient Name: Race Hider Procedure Date: 06/10/2015 9:34 AM MRN: [id number] Account #: [id number] Date of Birth: 02-01-1931 Admit Type: Outpatient Age: 81 Room: [facility] ENDO ROOM 1 Gender: Male Note Status: Finalized Procedure:         Upper GI endoscopy Indications:       Dysphagia Providers:         Manya Silvas, MD Referring MD:      Marga Hoots (Referring MD) Medicines:         Propofol per Anesthesia Complications:     No immediate complications. Procedure:         Pre-Anesthesia Assessment:                    - After reviewing the risks and benefits, the patient was                     deemed in satisfactory condition to undergo the procedure.                    After obtaining informed consent, the endoscope was passed                     under direct vision. Throughout the procedure, the                     patient's blood pressure, pulse, and oxygen saturations                     were monitored continuously. The Endoscope was introduced                     through the mouth, and advanced to the second part of                     duodenum. The upper GI endoscopy was accomplished without                     difficulty. The patient tolerated the procedure well. Findings:      A moderate Schatzki ring (acquired) was found at the gastroesophageal       junction. At the end of the exam A guidewire was placed and the scope       was withdrawn. Dilation was performed with a Savary dilator with mild       resistance at 17 mm.      A small hiatus hernia was present. Stomach otherwise normal.      The examined duodenum was normal. Two tiny erythematous spots were seen       in second portion of duodenum. Impression:        - Moderate Schatzki ring. Dilated.                    - Small hiatus hernia.                    - Normal examined duodenum.                    - No specimens collected. Recommendation:    - soft food  for 3 days, eat slowly, chew well, take small                     bites Manya Silvas, MD 06/10/2015 9:50:23 AM This report has been signed  electronically. Number of Addenda: 0 Note Initiated On: 06/10/2015 9:34 AM      [facility]

## 2015-06-10 NOTE — Anesthesia Postprocedure Evaluation (Signed)
Anesthesia Post Note  Patient: Benjamin Brewer  Procedure(s) Performed: Procedure(s) (LRB): ESOPHAGOGASTRODUODENOSCOPY (EGD) WITH PROPOFOL (N/A) SAVORY DILATION (N/A)  Patient location during evaluation: Endoscopy Anesthesia Type: General Level of consciousness: awake and alert Pain management: pain level controlled Vital Signs Assessment: post-procedure vital signs reviewed and stable Respiratory status: spontaneous breathing, nonlabored ventilation, respiratory function stable and patient connected to nasal cannula oxygen Cardiovascular status: blood pressure returned to baseline and stable Postop Assessment: No signs of nausea or vomiting Anesthetic complications: no    Last Vitals:  Filed Vitals:   06/10/15 1010 06/10/15 1020  BP: 152/75 153/68  Pulse: 55 60  Temp:    Resp: 16 18    Last Pain: There were no vitals filed for this visit.               Precious Haws Muntaha Vermette

## 2015-06-12 ENCOUNTER — Encounter: Payer: Self-pay | Admitting: Unknown Physician Specialty

## 2015-07-23 ENCOUNTER — Other Ambulatory Visit: Payer: Self-pay | Admitting: Cardiovascular Disease

## 2015-07-23 NOTE — Telephone Encounter (Signed)
Requested Prescriptions   Pending Prescriptions Disp Refills  . ELIQUIS 5 MG TABS tablet [Pharmacy Med Name: ELIQUIS 5 MG TABLET] 60 tablet 3    Sig: take 1 tablet by mouth twice a day

## 2015-08-04 ENCOUNTER — Other Ambulatory Visit: Payer: Self-pay | Admitting: Cardiovascular Disease

## 2015-09-27 ENCOUNTER — Ambulatory Visit (INDEPENDENT_AMBULATORY_CARE_PROVIDER_SITE_OTHER): Payer: PPO | Admitting: Cardiovascular Disease

## 2015-09-27 ENCOUNTER — Encounter: Payer: Self-pay | Admitting: Cardiovascular Disease

## 2015-09-27 VITALS — BP 142/70 | HR 61 | Ht 71.0 in | Wt 205.8 lb

## 2015-09-27 DIAGNOSIS — I4891 Unspecified atrial fibrillation: Secondary | ICD-10-CM | POA: Diagnosis not present

## 2015-09-27 DIAGNOSIS — I10 Essential (primary) hypertension: Secondary | ICD-10-CM

## 2015-09-27 DIAGNOSIS — I2510 Atherosclerotic heart disease of native coronary artery without angina pectoris: Secondary | ICD-10-CM

## 2015-09-27 MED ORDER — ATORVASTATIN CALCIUM 40 MG PO TABS: 40.0000 mg | ORAL_TABLET | Freq: Every day | ORAL | Status: DC

## 2015-09-27 NOTE — Progress Notes (Signed)
Cardiology Office Note   Date:  09/27/2015   ID:  Benjamin Brewer, DOB 1930-08-04, MRN [id number]  PCP:  Marcello Fennel, MD  Cardiologist:   Kathlyn Sacramento, MD   Chief Complaint  Patient presents with  . other    6 month follow up. "doing well."       History of Present Illness: Benjamin Brewer is a 82 y.o. male who presents for a followup visit regarding coronary artery disease and paroxysmal atrial fibrillation . He has known history of coronary artery disease status post angioplasty and drug-eluting stent placement to the LAD and RCA in 2009.  Most recent nuclear stress test in 2013 showed no evidence of ischemia with normal ejection fraction.  He had one episode of atrial fibrillation with rapid ventricular response in 2015 and has been on anticoagulation since then. He has not had any recurrent episodes since that time. Echocardiogram in 2016 showed an ejection fraction of 55-60%, mild mitral regurgitation and mildly dilated right atrium. He has been doing extremely well and denies any chest pain, shortness of breath or palpitations. He reports no side effects with medications.    Past Medical History  Diagnosis Date  . Other dyspnea and respiratory abnormality     on exertion  . Intermediate coronary syndrome (HCC)     angina, unstable  . Dyslipidemia   . Benign prostatic hypertrophy     Hx of   . Hypertension   . Hyperlipidemia   . History of tachycardia   . CAD (coronary artery disease)     native vessel. Status post LAD and RCA angioplasty and drug-eluting stent placement in 2009    Past Surgical History  Procedure Laterality Date  . Cardiac catheterization      [place]; x2  . Cardiac catheterization      [facility]  . Cardiac catheterization      [facility]  . Tonsillectomy    . Hernia repair    . Esophagogastroduodenoscopy (egd) with propofol N/A 06/10/2015    Procedure: ESOPHAGOGASTRODUODENOSCOPY (EGD) WITH PROPOFOL;  Surgeon: Manya Silvas, MD;   Location: [place];  Service: Endoscopy;  Laterality: N/A;  . Savory dilation N/A 06/10/2015    Procedure: SAVORY DILATION;  Surgeon: Manya Silvas, MD;  Location: [facility] ENDOSCOPY;  Service: Endoscopy;  Laterality: N/A;     Current Outpatient Prescriptions  Medication Sig Dispense Refill  . alfuzosin (UROXATRAL) 10 MG 24 hr tablet Take 10 mg by mouth daily.      . cyanocobalamin 500 MCG tablet Take 500 mcg by mouth daily.    Marland Kitchen ELIQUIS 5 MG TABS tablet take 1 tablet by mouth twice a day 60 tablet 3  . ferrous sulfate (FERRO-BOB) 325 (65 FE) MG tablet Take 325 mg by mouth daily.      . finasteride (PROSCAR) 5 MG tablet Take 5 mg by mouth daily.    . fluticasone (FLONASE) 50 MCG/ACT nasal spray Place 1-2 sprays into both nostrils daily. (Patient taking differently: Place 1-2 sprays into both nostrils as needed. ) 16 g 2  . metoprolol succinate (TOPROL-XL) 50 MG 24 hr tablet Take 1 tablet (50 mg total) by mouth daily. Take with or immediately following a meal. 30 tablet 6  . Multiple Vitamins-Minerals (EYE VITAMINS PO) Take by mouth daily.    . nitroGLYCERIN (NITROSTAT) 0.4 MG SL tablet place 1 tablet under the tongue if needed every 5 minutes for chest pain for 3 doses IF NO RELIEF AFTER  3RD DOSE CALL PRESCRIBER OR 911. 25 tablet 3  . pantoprazole (PROTONIX) 40 MG tablet take 1 tablet by mouth once daily 30 tablet 3  . atorvastatin (LIPITOR) 40 MG tablet Take 1 tablet (40 mg total) by mouth daily. 90 tablet 3   No current facility-administered medications for this visit.    Allergies:   Terazosin    Social History:  The patient  reports that he has never smoked. He has never used smokeless tobacco. He reports that he drinks alcohol. He reports that he does not use illicit drugs.   Family History:  The patient's family history includes Heart attack in his mother; Hypertension in his mother; Stroke in his mother.    ROS:  Please see the history of present illness.   Otherwise,  review of systems are positive for none.   All other systems are reviewed and negative.    PHYSICAL EXAM: VS:  BP 142/70 mmHg  Pulse 61  Ht 5\' 11"  (1.803 m)  Wt 205 lb 12 oz (93.328 kg)  BMI 28.71 kg/m2 , BMI Body mass index is 28.71 kg/(m^2). GEN: Well nourished, well developed, in no acute distress HEENT: normal Neck: no JVD, carotid bruits, or masses Cardiac: RRR; no murmurs, rubs, or gallops,no edema  Respiratory:  clear to auscultation bilaterally, normal work of breathing GI: soft, nontender, nondistended, + BS MS: no deformity or atrophy Skin: warm and dry, no rash Neuro:  Strength and sensation are intact Psych: euthymic mood, full affect   EKG:  EKG is ordered today. The ekg ordered today demonstrates normal sinus rhythm with no significant ST or T wave changes.   Recent Labs: No results found for requested labs within last 365 days.    Lipid Panel    Component Value Date/Time   CHOL 115 10/08/2012 0554   TRIG 75 10/08/2012 0554   HDL 43 10/08/2012 0554   VLDL 15 10/08/2012 0554   LDLCALC 57 10/08/2012 0554      Wt Readings from Last 3 Encounters:  09/27/15 205 lb 12 oz (93.328 kg)  06/10/15 200 lb (90.719 kg)  03/28/15 202 lb 8 oz (91.853 kg)        ASSESSMENT AND PLAN:  1.  Coronary artery disease involving native coronary arteries without angina: He continues to do extremely well with no symptoms suggestive of angina. Continue medical therapy. He is not on antiplatelet medications given that he is on long-term anticoagulation.  2. Paroxysmal atrial fibrillation: No recurrent episodes. He is currently on Toprol 50 mg once daily. He has chronic bradycardia but he is overall asymptomatic from it. Continue long-term anticoagulation with Eliquis . I reviewed his labs from October which overall were unremarkable.   3. Essential hypertension: Blood pressure is reasonably controlled.   4. Hyperlipidemia: I reviewed his lipid profile from October 2016 which  showed a total cholesterol of 143, triglyceride 82, HDL of 42 and an LDL of 85. His LDL has been about 80 over the last 2 years. Thus, I elected to increase the dose of atorvastatin 40 mg once daily.     Disposition:   FU with me in 9 months  Signed,  Kathlyn Sacramento, MD  09/27/2015 12:03 PM    [place]

## 2015-09-27 NOTE — Patient Instructions (Addendum)
Medication Instructions:  Your physician has recommended you make the following change in your medication: Increased Atorvastatin to 40 mg Once Daily   Labwork: None Ordered  Testing/Procedures: None Ordered  Follow-Up: Your physician recommends that you schedule a follow-up appointment in: 9 Months with Dr. Fletcher Anon  Date & Time: ___________________________________________________________   Any Other Special Instructions Will Be Listed Below (If Applicable).     If you need a refill on your cardiac medications before your next appointment, please call your pharmacy.

## 2015-10-28 DIAGNOSIS — H35033 Hypertensive retinopathy, bilateral: Secondary | ICD-10-CM | POA: Diagnosis not present

## 2015-10-28 DIAGNOSIS — H353211 Exudative age-related macular degeneration, right eye, with active choroidal neovascularization: Secondary | ICD-10-CM | POA: Diagnosis not present

## 2015-10-29 DIAGNOSIS — N183 Chronic kidney disease, stage 3 (moderate): Secondary | ICD-10-CM | POA: Diagnosis not present

## 2015-10-29 DIAGNOSIS — E7800 Pure hypercholesterolemia, unspecified: Secondary | ICD-10-CM | POA: Diagnosis not present

## 2015-10-29 DIAGNOSIS — Z79899 Other long term (current) drug therapy: Secondary | ICD-10-CM | POA: Diagnosis not present

## 2015-11-05 DIAGNOSIS — I482 Chronic atrial fibrillation: Secondary | ICD-10-CM | POA: Diagnosis not present

## 2015-11-05 DIAGNOSIS — N183 Chronic kidney disease, stage 3 (moderate): Secondary | ICD-10-CM | POA: Diagnosis not present

## 2015-11-05 DIAGNOSIS — Z79899 Other long term (current) drug therapy: Secondary | ICD-10-CM | POA: Diagnosis not present

## 2015-11-05 DIAGNOSIS — I2510 Atherosclerotic heart disease of native coronary artery without angina pectoris: Secondary | ICD-10-CM | POA: Diagnosis not present

## 2015-11-05 DIAGNOSIS — E7800 Pure hypercholesterolemia, unspecified: Secondary | ICD-10-CM | POA: Diagnosis not present

## 2015-11-05 DIAGNOSIS — I10 Essential (primary) hypertension: Secondary | ICD-10-CM | POA: Diagnosis not present

## 2015-11-05 DIAGNOSIS — K219 Gastro-esophageal reflux disease without esophagitis: Secondary | ICD-10-CM | POA: Diagnosis not present

## 2015-11-14 ENCOUNTER — Other Ambulatory Visit: Payer: Self-pay | Admitting: Cardiovascular Disease

## 2015-11-21 ENCOUNTER — Other Ambulatory Visit: Payer: Self-pay | Admitting: Cardiovascular Disease

## 2015-11-27 DIAGNOSIS — H25812 Combined forms of age-related cataract, left eye: Secondary | ICD-10-CM | POA: Diagnosis not present

## 2015-11-27 DIAGNOSIS — H25811 Combined forms of age-related cataract, right eye: Secondary | ICD-10-CM | POA: Diagnosis not present

## 2015-12-12 DIAGNOSIS — H25811 Combined forms of age-related cataract, right eye: Secondary | ICD-10-CM | POA: Diagnosis not present

## 2015-12-12 DIAGNOSIS — H2511 Age-related nuclear cataract, right eye: Secondary | ICD-10-CM | POA: Diagnosis not present

## 2016-01-17 DIAGNOSIS — M9902 Segmental and somatic dysfunction of thoracic region: Secondary | ICD-10-CM | POA: Diagnosis not present

## 2016-01-17 DIAGNOSIS — M5430 Sciatica, unspecified side: Secondary | ICD-10-CM | POA: Diagnosis not present

## 2016-01-17 DIAGNOSIS — M6240 Contracture of muscle, unspecified site: Secondary | ICD-10-CM | POA: Diagnosis not present

## 2016-01-17 DIAGNOSIS — M9903 Segmental and somatic dysfunction of lumbar region: Secondary | ICD-10-CM | POA: Diagnosis not present

## 2016-01-17 DIAGNOSIS — M9905 Segmental and somatic dysfunction of pelvic region: Secondary | ICD-10-CM | POA: Diagnosis not present

## 2016-01-17 DIAGNOSIS — M791 Myalgia: Secondary | ICD-10-CM | POA: Diagnosis not present

## 2016-01-17 DIAGNOSIS — Z72820 Sleep deprivation: Secondary | ICD-10-CM | POA: Diagnosis not present

## 2016-01-20 DIAGNOSIS — M791 Myalgia: Secondary | ICD-10-CM | POA: Diagnosis not present

## 2016-01-20 DIAGNOSIS — M9905 Segmental and somatic dysfunction of pelvic region: Secondary | ICD-10-CM | POA: Diagnosis not present

## 2016-01-20 DIAGNOSIS — M5430 Sciatica, unspecified side: Secondary | ICD-10-CM | POA: Diagnosis not present

## 2016-01-20 DIAGNOSIS — Z72820 Sleep deprivation: Secondary | ICD-10-CM | POA: Diagnosis not present

## 2016-01-20 DIAGNOSIS — M9902 Segmental and somatic dysfunction of thoracic region: Secondary | ICD-10-CM | POA: Diagnosis not present

## 2016-01-20 DIAGNOSIS — M6240 Contracture of muscle, unspecified site: Secondary | ICD-10-CM | POA: Diagnosis not present

## 2016-01-20 DIAGNOSIS — M9903 Segmental and somatic dysfunction of lumbar region: Secondary | ICD-10-CM | POA: Diagnosis not present

## 2016-01-22 DIAGNOSIS — Z72820 Sleep deprivation: Secondary | ICD-10-CM | POA: Diagnosis not present

## 2016-01-22 DIAGNOSIS — M9903 Segmental and somatic dysfunction of lumbar region: Secondary | ICD-10-CM | POA: Diagnosis not present

## 2016-01-22 DIAGNOSIS — M5430 Sciatica, unspecified side: Secondary | ICD-10-CM | POA: Diagnosis not present

## 2016-01-22 DIAGNOSIS — M9905 Segmental and somatic dysfunction of pelvic region: Secondary | ICD-10-CM | POA: Diagnosis not present

## 2016-01-22 DIAGNOSIS — M791 Myalgia: Secondary | ICD-10-CM | POA: Diagnosis not present

## 2016-01-22 DIAGNOSIS — M9902 Segmental and somatic dysfunction of thoracic region: Secondary | ICD-10-CM | POA: Diagnosis not present

## 2016-01-22 DIAGNOSIS — M6240 Contracture of muscle, unspecified site: Secondary | ICD-10-CM | POA: Diagnosis not present

## 2016-03-12 ENCOUNTER — Other Ambulatory Visit: Payer: Self-pay | Admitting: Cardiovascular Disease

## 2016-03-16 ENCOUNTER — Other Ambulatory Visit: Payer: Self-pay | Admitting: Cardiovascular Disease

## 2016-05-06 DIAGNOSIS — E7800 Pure hypercholesterolemia, unspecified: Secondary | ICD-10-CM | POA: Diagnosis not present

## 2016-05-06 DIAGNOSIS — Z79899 Other long term (current) drug therapy: Secondary | ICD-10-CM | POA: Diagnosis not present

## 2016-05-06 DIAGNOSIS — N183 Chronic kidney disease, stage 3 (moderate): Secondary | ICD-10-CM | POA: Diagnosis not present

## 2016-05-12 DIAGNOSIS — E7800 Pure hypercholesterolemia, unspecified: Secondary | ICD-10-CM | POA: Diagnosis not present

## 2016-05-12 DIAGNOSIS — Z79899 Other long term (current) drug therapy: Secondary | ICD-10-CM | POA: Diagnosis not present

## 2016-05-12 DIAGNOSIS — Z23 Encounter for immunization: Secondary | ICD-10-CM | POA: Diagnosis not present

## 2016-05-12 DIAGNOSIS — Z0000 Encounter for general adult medical examination without abnormal findings: Secondary | ICD-10-CM | POA: Diagnosis not present

## 2016-05-14 ENCOUNTER — Other Ambulatory Visit: Payer: Self-pay | Admitting: Cardiovascular Disease

## 2016-06-23 ENCOUNTER — Ambulatory Visit (INDEPENDENT_AMBULATORY_CARE_PROVIDER_SITE_OTHER): Payer: PPO | Admitting: Cardiovascular Disease

## 2016-06-23 ENCOUNTER — Encounter: Payer: Self-pay | Admitting: Cardiovascular Disease

## 2016-06-23 VITALS — BP 140/70 | HR 58 | Ht 71.0 in | Wt 201.0 lb

## 2016-06-23 DIAGNOSIS — I2510 Atherosclerotic heart disease of native coronary artery without angina pectoris: Secondary | ICD-10-CM | POA: Diagnosis not present

## 2016-06-23 DIAGNOSIS — E7800 Pure hypercholesterolemia, unspecified: Secondary | ICD-10-CM | POA: Diagnosis not present

## 2016-06-23 DIAGNOSIS — I4891 Unspecified atrial fibrillation: Secondary | ICD-10-CM

## 2016-06-23 DIAGNOSIS — I10 Essential (primary) hypertension: Secondary | ICD-10-CM | POA: Diagnosis not present

## 2016-06-23 NOTE — Patient Instructions (Signed)
Medication Instructions: Continue same medications.   Labwork: None.   Procedures/Testing: None.   Follow-Up: 1 year with Dr. Arida.   Any Additional Special Instructions Will Be Listed Below (If Applicable).     If you need a refill on your cardiac medications before your next appointment, please call your pharmacy.   

## 2016-06-23 NOTE — Progress Notes (Signed)
Cardiology Office Note   Date:  06/23/2016   ID:  Benjamin Brewer, DOB 26-Sep-1930, MRN [id number]  PCP:  Marcello Fennel, MD  Cardiologist:   Kathlyn Sacramento, MD   Chief Complaint  Patient presents with  . other    9 month follow up. Meds reviewed by the pt. verbally. "doing well."       History of Present Illness: Benjamin Brewer is a 82 y.o. male who presents for a followup visit regarding coronary artery disease and paroxysmal atrial fibrillation . He has known history of coronary artery disease status post angioplasty and drug-eluting stent placement to the LAD and RCA in 2009.  Most recent nuclear stress test in 2013 showed no evidence of ischemia with normal ejection fraction.  He had one episode of atrial fibrillation with rapid ventricular response in 2015 and has been on anticoagulation since then. He has not had any recurrent episodes since that time. Echocardiogram in 2016 showed an ejection fraction of 55-60%, mild mitral regurgitation and mildly dilated right atrium. He has been doing extremely well and denies any chest pain or shortness of breath. He reports brief palpitations but no prolonged tachycardia. He has been taking his medications regularly with no reported side effects.   Past Medical History:  Diagnosis Date  . Benign prostatic hypertrophy    Hx of   . CAD (coronary artery disease)    native vessel. Status post LAD and RCA angioplasty and drug-eluting stent placement in 2009  . Dyslipidemia   . History of tachycardia   . Hyperlipidemia   . Hypertension   . Intermediate coronary syndrome (HCC)    angina, unstable  . Other dyspnea and respiratory abnormality    on exertion    Past Surgical History:  Procedure Laterality Date  . [place]; x2  . [place]  . ESOPHAGOGASTRODUODENOSCOPY (EGD) WITH PROPOFOL N/A 06/10/2015   Procedure:  ESOPHAGOGASTRODUODENOSCOPY (EGD) WITH PROPOFOL;  Surgeon: Manya Silvas, MD;  Location: [facility] ENDOSCOPY;  Service: Endoscopy;  Laterality: N/A;  . HERNIA REPAIR    . SAVORY DILATION N/A 06/10/2015   Procedure: SAVORY DILATION;  Surgeon: Manya Silvas, MD;  Location: [facility] ENDOSCOPY;  Service: Endoscopy;  Laterality: N/A;  . TONSILLECTOMY       Current Outpatient Prescriptions  Medication Sig Dispense Refill  . alfuzosin (UROXATRAL) 10 MG 24 hr tablet Take 10 mg by mouth daily.      Marland Kitchen atorvastatin (LIPITOR) 40 MG tablet Take 1 tablet (40 mg total) by mouth daily. 90 tablet 3  . cyanocobalamin 500 MCG tablet Take 500 mcg by mouth daily.    Marland Kitchen ELIQUIS 5 MG TABS tablet take 1 tablet by mouth twice a day 60 tablet 3  . ferrous sulfate (FERRO-BOB) 325 (65 FE) MG tablet Take 325 mg by mouth daily.      . finasteride (PROSCAR) 5 MG tablet Take 5 mg by mouth daily.    . fluticasone (FLONASE) 50 MCG/ACT nasal spray Place 1-2 sprays into both nostrils daily. (Patient taking differently: Place 1-2 sprays into both nostrils as needed. ) 16 g 2  . metoprolol succinate (TOPROL-XL) 50 MG 24 hr tablet Take 1 tablet (50 mg total) by mouth daily. Take with or immediately following a meal. 30 tablet 6  . Multiple Vitamins-Minerals (EYE VITAMINS PO) Take by mouth daily.    Marland Kitchen  NITROSTAT 0.4 MG SL tablet place 1 tablet under the tongue if needed every 5 minutes for chest pain for 3 doses IF NO RELIEF AFTER 3RD DOSE CALL PRESCRIBER OR 911. 25 tablet 3  . pantoprazole (PROTONIX) 40 MG tablet take 1 tablet by mouth once daily 30 tablet 3   No current facility-administered medications for this visit.     Allergies:   Terazosin    Social History:  The patient  reports that he has never smoked. He has never used smokeless tobacco. He reports that he drinks alcohol. He reports that he does not use drugs.   Family History:  The patient's family history includes Heart attack in his mother; Hypertension in his  mother; Stroke in his mother.    ROS:  Please see the history of present illness.   Otherwise, review of systems are positive for none.   All other systems are reviewed and negative.    PHYSICAL EXAM: VS:  BP 140/70 (BP Location: Left Arm, Patient Position: Sitting, Cuff Size: Normal)   Pulse (!) 58   Ht 5\' 11"  (1.803 m)   Wt 201 lb (91.2 kg)   BMI 28.03 kg/m  , BMI Body mass index is 28.03 kg/m. GEN: Well nourished, well developed, in no acute distress  HEENT: normal  Neck: no JVD, carotid bruits, or masses Cardiac: RRR; no murmurs, rubs, or gallops,no edema  Respiratory:  clear to auscultation bilaterally, normal work of breathing GI: soft, nontender, nondistended, + BS MS: no deformity or atrophy  Skin: warm and dry, no rash Neuro:  Strength and sensation are intact Psych: euthymic mood, full affect   EKG:  EKG is ordered today. The ekg ordered today demonstrates normal sinus rhythm with no significant ST or T wave changes.   Recent Labs: No results found for requested labs within last 8760 hours.    Lipid Panel    Component Value Date/Time   CHOL 115 10/08/2012 0554   TRIG 75 10/08/2012 0554   HDL 43 10/08/2012 0554   VLDL 15 10/08/2012 0554   LDLCALC 57 10/08/2012 0554      Wt Readings from Last 3 Encounters:  06/23/16 201 lb (91.2 kg)  09/27/15 205 lb 12 oz (93.3 kg)  06/10/15 200 lb (90.7 kg)        ASSESSMENT AND PLAN:  1.  Coronary artery disease involving native coronary arteries without angina: He continues to do extremely well with no symptoms suggestive of angina. Continue medical therapy. He is not on antiplatelet medications given that he is on long-term anticoagulation.  2. Paroxysmal atrial fibrillation: He describes brief palpitations but no prolonged tachycardia.   He has chronic bradycardia but he is overall asymptomatic from it. Continue long-term anticoagulation with Eliquis . I reviewed his recent labs which showed normal renal function  and is stable borderline anemia with a hemoglobin of 13.3.  3. Essential hypertension: Blood pressure is reasonably controlled.   4. Hyperlipidemia: I increased his atorvastatin to 40 mg once daily during last visit. I reviewed his lipid profile from October which showed a total cholesterol of 134, triglyceride of 77 and an LDL of 75. This is close to target. I made no changes.   Disposition:   FU with me in 12 months  Signed,  Kathlyn Sacramento, MD  06/23/2016 2:30 PM    [place]

## 2016-09-09 ENCOUNTER — Other Ambulatory Visit: Payer: Self-pay | Admitting: Cardiovascular Disease

## 2016-10-07 ENCOUNTER — Other Ambulatory Visit: Payer: Self-pay | Admitting: Cardiovascular Disease

## 2016-11-03 DIAGNOSIS — E7800 Pure hypercholesterolemia, unspecified: Secondary | ICD-10-CM | POA: Diagnosis not present

## 2016-11-03 DIAGNOSIS — Z79899 Other long term (current) drug therapy: Secondary | ICD-10-CM | POA: Diagnosis not present

## 2016-11-10 DIAGNOSIS — I480 Paroxysmal atrial fibrillation: Secondary | ICD-10-CM | POA: Diagnosis not present

## 2016-11-10 DIAGNOSIS — Z79899 Other long term (current) drug therapy: Secondary | ICD-10-CM | POA: Diagnosis not present

## 2016-11-10 DIAGNOSIS — E7800 Pure hypercholesterolemia, unspecified: Secondary | ICD-10-CM | POA: Diagnosis not present

## 2016-11-10 DIAGNOSIS — N183 Chronic kidney disease, stage 3 (moderate): Secondary | ICD-10-CM | POA: Diagnosis not present

## 2016-11-10 DIAGNOSIS — I2510 Atherosclerotic heart disease of native coronary artery without angina pectoris: Secondary | ICD-10-CM | POA: Diagnosis not present

## 2016-11-10 DIAGNOSIS — I10 Essential (primary) hypertension: Secondary | ICD-10-CM | POA: Diagnosis not present

## 2016-12-12 ENCOUNTER — Other Ambulatory Visit: Payer: Self-pay | Admitting: Cardiovascular Disease

## 2017-02-18 ENCOUNTER — Emergency Department
Admission: EM | Admit: 2017-02-18 | Discharge: 2017-02-18 | Disposition: A | Discharge status: Home / Self Care | Payer: PPO | Attending: Emergency Medicine | Admitting: Emergency Medicine

## 2017-02-18 ENCOUNTER — Telehealth: Payer: Self-pay | Admitting: Cardiovascular Disease

## 2017-02-18 ENCOUNTER — Emergency Department: Payer: PPO

## 2017-02-18 ENCOUNTER — Other Ambulatory Visit: Payer: Self-pay

## 2017-02-18 DIAGNOSIS — I10 Essential (primary) hypertension: Secondary | ICD-10-CM | POA: Diagnosis not present

## 2017-02-18 DIAGNOSIS — I4891 Unspecified atrial fibrillation: Secondary | ICD-10-CM | POA: Insufficient documentation

## 2017-02-18 DIAGNOSIS — I2510 Atherosclerotic heart disease of native coronary artery without angina pectoris: Secondary | ICD-10-CM | POA: Diagnosis not present

## 2017-02-18 DIAGNOSIS — I480 Paroxysmal atrial fibrillation: Secondary | ICD-10-CM | POA: Diagnosis not present

## 2017-02-18 DIAGNOSIS — R079 Chest pain, unspecified: Secondary | ICD-10-CM | POA: Diagnosis not present

## 2017-02-18 LAB — CBC
HCT: 40.3 % (ref 40.0–52.0)
HEMOGLOBIN: 13.6 g/dL (ref 13.0–18.0)
MCH: 30.4 pg (ref 26.0–34.0)
MCHC: 33.8 g/dL (ref 32.0–36.0)
MCV: 90.1 fL (ref 80.0–100.0)
Platelets: 208 10*3/uL (ref 150–440)
RBC: 4.47 MIL/uL (ref 4.40–5.90)
RDW: 14.2 % (ref 11.5–14.5)
WBC: 7.8 10*3/uL (ref 3.8–10.6)

## 2017-02-18 LAB — BASIC METABOLIC PANEL
ANION GAP: 7 (ref 5–15)
BUN: 18 mg/dL (ref 6–20)
CALCIUM: 9.4 mg/dL (ref 8.9–10.3)
CO2: 24 mmol/L (ref 22–32)
CREATININE: 1.48 mg/dL — AB (ref 0.61–1.24)
Chloride: 107 mmol/L (ref 101–111)
GFR calc Af Amer: 48 mL/min — ABNORMAL LOW (ref >60)
GFR calc non Af Amer: 42 mL/min — ABNORMAL LOW (ref >60)
GLUCOSE: 107 mg/dL — AB (ref 65–99)
Potassium: 4 mmol/L (ref 3.5–5.1)
Sodium: 138 mmol/L (ref 135–145)

## 2017-02-18 LAB — TROPONIN I: Troponin I: <0.03 ng/mL (ref <0.03)

## 2017-02-18 MED ORDER — SODIUM CHLORIDE 0.9 % IV SOLN
Freq: Once | INTRAVENOUS | Status: AC
  Administered 2017-02-18: 17:00:00 via INTRAVENOUS

## 2017-02-18 MED ORDER — METOPROLOL TARTRATE 5 MG/5ML IV SOLN
5.0000 mg | Freq: Once | INTRAVENOUS | Status: AC
  Administered 2017-02-18: 5 mg via INTRAVENOUS
  Filled 2017-02-18: qty 5

## 2017-02-18 NOTE — ED Provider Notes (Signed)
[facility] Emergency Department Provider Note       Time seen: ----------------------------------------- 4:24 PM on 02/18/2017 -----------------------------------------     I have reviewed the triage vital signs and the nursing notes.   HISTORY   Chief Complaint Chest Pain    HPI Benjamin Brewer is a 83 y.o. male who presents to the ED for palpitations that started today around 10 AM. Later in the afternoon he had some dizziness with walking. Patient has had a small amount of chest pressure but overall has not really had chest pain. He feels like his heart is beating irregularly. He does have a history of paroxysmal atrial fibrillation but typically is not in A. fib. He denies any recent illness or change in his medicines. Typically takes Eliquis and metoprolol.   Past Medical History:  Diagnosis Date  . Benign prostatic hypertrophy    Hx of   . CAD (coronary artery disease)    native vessel. Status post LAD and RCA angioplasty and drug-eluting stent placement in 2009  . Dyslipidemia   . History of tachycardia   . Hyperlipidemia   . Hypertension   . Intermediate coronary syndrome (HCC)    angina, unstable  . Other dyspnea and respiratory abnormality    on exertion    Patient Active Problem List   Diagnosis Date Noted  . Atrial fibrillation ([place]) 08/20/2014  . BPPV (benign paroxysmal positional vertigo) 10/23/2013  . Sinus bradycardia 06/16/2013  . Essential hypertension 06/16/2013  . CAD, NATIVE VESSEL 07/03/2009  . DYSPNEA ON EXERTION 12/21/2008  . Hyperlipidemia 12/15/2008  . BENIGN PROSTATIC HYPERTROPHY, HX OF 12/15/2008    Past Surgical History:  Procedure Laterality Date  . [place]; x2  . [place]  . ESOPHAGOGASTRODUODENOSCOPY (EGD) WITH PROPOFOL N/A 06/10/2015   Procedure: ESOPHAGOGASTRODUODENOSCOPY (EGD) WITH PROPOFOL;   Surgeon: Manya Silvas, MD;  Location: [facility] ENDOSCOPY;  Service: Endoscopy;  Laterality: N/A;  . HERNIA REPAIR    . SAVORY DILATION N/A 06/10/2015   Procedure: SAVORY DILATION;  Surgeon: Manya Silvas, MD;  Location: [facility] ENDOSCOPY;  Service: Endoscopy;  Laterality: N/A;  . TONSILLECTOMY      Allergies Terazosin  Social History Social History  Substance Use Topics  . Smoking status: Never Smoker  . Smokeless tobacco: Never Used  . Alcohol use Yes     Comment: occasional     Review of Systems Constitutional: Negative for fever. Eyes: Negative for vision changes ENT:  Negative for congestion, sore throat Cardiovascular: Positive for palpitations Respiratory: Negative for shortness of breath. Gastrointestinal: Negative for abdominal pain, vomiting and diarrhea. Genitourinary: Negative for dysuria. Musculoskeletal: Negative for back pain. Skin: Negative for rash. Neurological: Negative for headaches, focal weakness or numbness. Positive for dizziness  All systems negative/normal/unremarkable except as stated in the HPI  ____________________________________________   PHYSICAL EXAM:  VITAL SIGNS: ED Triage Vitals  Enc Vitals Group     BP 02/18/17 1557 (!) 143/78     Pulse Rate 02/18/17 1557 77     Resp 02/18/17 1557 19     Temp 02/18/17 1557 98 F (36.7 C)     Temp Source 02/18/17 1557 Oral     SpO2 02/18/17 1557 97 %     Weight 02/18/17 1558 205 lb (93 kg)     Height 02/18/17 1558 5\' 11"  (1.803 m)     Head  Circumference --      Peak Flow --      Pain Score --      Pain Loc --      Pain Edu? --      Excl. in [place]? --     Constitutional: Alert and oriented. Well appearing and in no distress. Eyes: Conjunctivae are normal. Normal extraocular movements. ENT   Head: Normocephalic and atraumatic.   Nose: No congestion/rhinnorhea.   Mouth/Throat: Mucous membranes are moist.   Neck: No stridor. Cardiovascular: Irregularly irregular rhythm. No  murmurs, rubs, or gallops. Respiratory: Normal respiratory effort without tachypnea nor retractions. Breath sounds are clear and equal bilaterally. No wheezes/rales/rhonchi. Gastrointestinal: Soft and nontender. Normal bowel sounds Musculoskeletal: Nontender with normal range of motion in extremities. No lower extremity tenderness nor edema. Neurologic:  Normal speech and language. No gross focal neurologic deficits are appreciated.  Skin:  Skin is warm, dry and intact. No rash noted. Psychiatric: Mood and affect are normal. Speech and behavior are normal.  ____________________________________________  EKG: Interpreted by me. A 2 fibrillation with a rapid ventricular response, rate is 113 bpm, low voltage QRS, normal QT. Normal axis.  ____________________________________________  ED COURSE:  Pertinent labs & imaging results that were available during my care of the patient were reviewed by me and considered in my medical decision making (see chart for details). Patient presents for palpitations and atrial fibrillation, we will assess with labs and imaging as indicated. Patient received IV fluids and IV Lopressor   Procedures ____________________________________________   LABS (pertinent positives/negatives)  Labs Reviewed  BASIC METABOLIC PANEL - Abnormal; Notable for the following:       Result Value   Glucose, Bld 107 (*)    Creatinine, Ser 1.48 (*)    GFR calc non Af Amer 42 (*)    GFR calc Af Amer 48 (*)    All other components within normal limits  CBC  TROPONIN I  TROPONIN I    RADIOLOGY Images were viewed by me  Chest x-ray IMPRESSION: No active cardiopulmonary disease. ____________________________________________  FINAL ASSESSMENT AND PLAN  Paroxysmal atrial fibrillation  Plan: Patient's labs and imaging were dictated above. Patient had presented for Atrial fibrillation with a rapid ventricular response. He currently is in a sinus rhythm and is asymptomatic.  Heart rate is 65 bpm and blood pressure is 128/76. I did discuss with cardiology on call who recommends continuing his current medications. He is stable for outpatient follow-up.   Earleen Newport, MD   Note: This note was generated in part or whole with voice recognition software. Voice recognition is usually quite accurate but there are transcription errors that can and very often do occur. I apologize for any typographical errors that were not detected and corrected.     Earleen Newport, MD 02/18/17 [phone]

## 2017-02-18 NOTE — Telephone Encounter (Signed)
S/w pt's wife (on [place]) who reports pt feels he is in afib. He is in the [employer] business and has been in and out of the heat and cold today. Wife checked VS when he came into the house: BP 140/73 HR 93. He c/o dizziness. After resting for 15 minutes, VS: BP 117/57, HR 64. Pt takes eliquis 5mg  BID and metoprolol 50mg  qd with no missed doses. Reviewed how to check pulse manually; wife checked and states it is regular.  Pt reports feeling better at this time. Wife and pt will continue to monitor and avoid the heat today. They will call back if sx return.

## 2017-02-18 NOTE — Telephone Encounter (Signed)
Attempted to call back. Left message for pt or pt's wife to call back.

## 2017-02-18 NOTE — ED Triage Notes (Signed)
Pt presents to ED c/o chest pressure that started today around 10 am and later in the afternoon with dizziness upon ambulation. Pt with hx of Afib.

## 2017-02-18 NOTE — ED Notes (Signed)
Report given to [facility]; pt now in xray will be roomed to 4

## 2017-02-18 NOTE — Telephone Encounter (Signed)
Patient went out today and is now feeling tired and feels like pulse is racing HR is 93   BP 140/73 Digital machine   Patient also complains of weakness in legs .     Patient thinks he may be in Afib   Patient wife wants to know what to do with him .

## 2017-02-18 NOTE — Telephone Encounter (Signed)
Pt wife states pt is getting dizzy, and cant walk but a short distance. States his BP is up and down. Asks if she should take him to the ED. States his legs "feel strange" when he walks.

## 2017-02-18 NOTE — ED Notes (Signed)
ED Provider at bedside. 

## 2017-02-24 ENCOUNTER — Other Ambulatory Visit: Payer: Self-pay | Admitting: Cardiovascular Disease

## 2017-03-08 ENCOUNTER — Ambulatory Visit (INDEPENDENT_AMBULATORY_CARE_PROVIDER_SITE_OTHER): Payer: PPO | Admitting: Cardiovascular Disease

## 2017-03-08 ENCOUNTER — Telehealth: Payer: Self-pay | Admitting: Cardiovascular Disease

## 2017-03-08 ENCOUNTER — Encounter: Payer: Self-pay | Admitting: Cardiovascular Disease

## 2017-03-08 VITALS — BP 140/66 | HR 48 | Ht 71.0 in | Wt 205.2 lb

## 2017-03-08 DIAGNOSIS — I480 Paroxysmal atrial fibrillation: Secondary | ICD-10-CM | POA: Diagnosis not present

## 2017-03-08 DIAGNOSIS — I10 Essential (primary) hypertension: Secondary | ICD-10-CM | POA: Diagnosis not present

## 2017-03-08 DIAGNOSIS — E7800 Pure hypercholesterolemia, unspecified: Secondary | ICD-10-CM

## 2017-03-08 DIAGNOSIS — I2510 Atherosclerotic heart disease of native coronary artery without angina pectoris: Secondary | ICD-10-CM | POA: Diagnosis not present

## 2017-03-08 NOTE — Progress Notes (Signed)
Cardiology Office Note   Date:  03/08/2017   ID:  Benjamin Brewer, DOB 1930/08/05, MRN [id number]  PCP:  Derinda Late, MD  Cardiologist:   Kathlyn Sacramento, MD   Chief Complaint  Patient presents with  . other    follow up from ED. Patient c/o blood pressure issues. Meds reviewed verbally with patient.       History of Present Illness: Benjamin Brewer is a 83 y.o. male who presents for a followup visit regarding coronary artery disease and paroxysmal atrial fibrillation . He has known history of coronary artery disease status post angioplasty and drug-eluting stent placement to the LAD and RCA in 2009.  Most recent nuclear stress test in 2013 showed no evidence of ischemia with normal ejection fraction.  He had one episode of atrial fibrillation with rapid ventricular response in 2015 and has been on anticoagulation since then. He has not had any recurrent episodes since that time. Echocardiogram in 2016 showed an ejection fraction of 55-60%, mild mitral regurgitation and mildly dilated right atrium.  He had palpitations recently and was to the emergency room where he was found to have atrial fibrillation with RVR. He converted to sinus rhythm quickly. Troponin was negative. No recurrent episodes since then. He continues to be very active and goes to the gym every morning for an hour with no exertional symptoms. He does report bradycardia with heart rate in the high 40s occasionally. No significant dizziness, syncope or presyncope.  Past Medical History:  Diagnosis Date  . Benign prostatic hypertrophy    Hx of   . CAD (coronary artery disease)    native vessel. Status post LAD and RCA angioplasty and drug-eluting stent placement in 2009  . Dyslipidemia   . History of tachycardia   . Hyperlipidemia   . Hypertension   . Intermediate coronary syndrome (HCC)    angina, unstable  . Other dyspnea and respiratory abnormality    on exertion    Past Surgical History:  Procedure  Laterality Date  . [place]; x2  . [place]  . ESOPHAGOGASTRODUODENOSCOPY (EGD) WITH PROPOFOL N/A 06/10/2015   Procedure: ESOPHAGOGASTRODUODENOSCOPY (EGD) WITH PROPOFOL;  Surgeon: Manya Silvas, MD;  Location: [facility] ENDOSCOPY;  Service: Endoscopy;  Laterality: N/A;  . HERNIA REPAIR    . SAVORY DILATION N/A 06/10/2015   Procedure: SAVORY DILATION;  Surgeon: Manya Silvas, MD;  Location: [facility] ENDOSCOPY;  Service: Endoscopy;  Laterality: N/A;  . TONSILLECTOMY       Current Outpatient Prescriptions  Medication Sig Dispense Refill  . alfuzosin (UROXATRAL) 10 MG 24 hr tablet Take 10 mg by mouth daily.      Marland Kitchen atorvastatin (LIPITOR) 20 MG tablet Take 20 mg by mouth daily.    Marland Kitchen atorvastatin (LIPITOR) 40 MG tablet take 1 tablet by mouth once daily 90 tablet 3  . cyanocobalamin 500 MCG tablet Take 500 mcg by mouth daily.    Marland Kitchen ELIQUIS 5 MG TABS tablet take 1 tablet by mouth twice a day 60 tablet 5  . ferrous sulfate (FERRO-BOB) 325 (65 FE) MG tablet Take 325 mg by mouth daily.      . finasteride (PROSCAR) 5 MG tablet Take 5 mg by mouth daily.    . fluticasone (FLONASE) 50 MCG/ACT nasal spray Place 1-2 sprays into both nostrils daily. (Patient taking differently: Place 1-2 sprays into both  nostrils as needed. ) 16 g 2  . metoprolol succinate (TOPROL-XL) 50 MG 24 hr tablet Take 1 tablet (50 mg total) by mouth daily. Take with or immediately following a meal. 30 tablet 6  . Multiple Vitamins-Minerals (EYE VITAMINS PO) Take by mouth 2 (two) times daily.     Marland Kitchen NITROSTAT 0.4 MG SL tablet place 1 tablet under the tongue if needed every 5 minutes for chest pain for 3 doses IF NO RELIEF AFTER 3RD DOSE CALL PRESCRIBER OR 911. 25 tablet 3  . pantoprazole (PROTONIX) 40 MG tablet take 1 tablet by mouth once daily 30 tablet 6   No current facility-administered medications for this visit.     Allergies:    Terazosin    Social History:  The patient  reports that he has never smoked. He has never used smokeless tobacco. He reports that he drinks alcohol. He reports that he does not use drugs.   Family History:  The patient's family history includes Heart attack in his mother; Hypertension in his mother; Stroke in his mother and unknown relative.    ROS:  Please see the history of present illness.   Otherwise, review of systems are positive for none.   All other systems are reviewed and negative.    PHYSICAL EXAM: VS:  BP 140/66 (BP Location: Left Arm, Patient Position: Sitting, Cuff Size: Normal)   Pulse (!) 48   Ht 5\' 11"  (1.803 m)   Wt 205 lb 4 oz (93.1 kg)   BMI 28.63 kg/m  , BMI Body mass index is 28.63 kg/m. GEN: Well nourished, well developed, in no acute distress  HEENT: normal  Neck: no JVD, carotid bruits, or masses Cardiac: RRR; no murmurs, rubs, or gallops,no edema  Respiratory:  clear to auscultation bilaterally, normal work of breathing GI: soft, nontender, nondistended, + BS MS: no deformity or atrophy  Skin: warm and dry, no rash Neuro:  Strength and sensation are intact Psych: euthymic mood, full affect   EKG:  EKG is ordered today. The ekg ordered today demonstrates sinus bradycardia with a heart rate of 48 bpm. No significant ST or T wave changes.   Recent Labs: 02/18/2017: BUN 18; Creatinine, Ser 1.48; Hemoglobin 13.6; Platelets 208; Potassium 4.0; Sodium 138    Lipid Panel    Component Value Date/Time   CHOL 115 10/08/2012 0554   TRIG 75 10/08/2012 0554   HDL 43 10/08/2012 0554   VLDL 15 10/08/2012 0554   LDLCALC 57 10/08/2012 0554      Wt Readings from Last 3 Encounters:  03/08/17 205 lb 4 oz (93.1 kg)  02/18/17 205 lb (93 kg)  06/23/16 201 lb (91.2 kg)        ASSESSMENT AND PLAN:  1.  Coronary artery disease involving native coronary arteries without angina:  Continue medical therapy. He is not on antiplatelet medications given that he is  on long-term anticoagulation.  2. Paroxysmal atrial fibrillation:  He had an episode of symptomatic atrial fibrillation recently. None since then. His episodes have not been frequent. I think for now we can continue metoprolol. However, if his episodes become more frequent, he will likely require an antiarrhythmic medication such as amiodarone. Continue anticoagulation with Eliquis.  3. Essential hypertension: Blood pressure is reasonably controlled.   4. Hyperlipidemia: Continue treatment with atorvastatin with a target LDL of less than 70.   Disposition:   FU with me in 6 months  Signed,  Kathlyn Sacramento, MD  03/08/2017 1:46 PM  [facility]

## 2017-03-08 NOTE — Patient Instructions (Signed)
Medication Instructions: Continue same medications.   Labwork: None.   Procedures/Testing: None.   Follow-Up: 6 months with Dr. Bronnie Vasseur.   Any Additional Special Instructions Will Be Listed Below (If Applicable).     If you need a refill on your cardiac medications before your next appointment, please call your pharmacy.   

## 2017-03-08 NOTE — Telephone Encounter (Signed)
Called pt regarding a 1:40pm opening today with Dr. Fletcher Anon. Left message on home VM to contact the office. Cell VM is full.

## 2017-03-26 ENCOUNTER — Other Ambulatory Visit: Payer: Self-pay | Admitting: Cardiovascular Disease

## 2017-04-12 ENCOUNTER — Ambulatory Visit: Payer: PPO | Admitting: Cardiovascular Disease

## 2017-04-20 ENCOUNTER — Other Ambulatory Visit: Payer: Self-pay | Admitting: Cardiovascular Disease

## 2017-05-07 DIAGNOSIS — Z79899 Other long term (current) drug therapy: Secondary | ICD-10-CM | POA: Diagnosis not present

## 2017-05-07 DIAGNOSIS — E7800 Pure hypercholesterolemia, unspecified: Secondary | ICD-10-CM | POA: Diagnosis not present

## 2017-05-14 DIAGNOSIS — Z23 Encounter for immunization: Secondary | ICD-10-CM | POA: Diagnosis not present

## 2017-05-14 DIAGNOSIS — Z0000 Encounter for general adult medical examination without abnormal findings: Secondary | ICD-10-CM | POA: Diagnosis not present

## 2017-09-09 ENCOUNTER — Ambulatory Visit: Payer: PPO | Admitting: Cardiovascular Disease

## 2017-09-09 ENCOUNTER — Encounter: Payer: Self-pay | Admitting: Cardiovascular Disease

## 2017-09-09 VITALS — BP 134/60 | HR 61 | Ht 71.0 in | Wt 212.5 lb

## 2017-09-09 DIAGNOSIS — I10 Essential (primary) hypertension: Secondary | ICD-10-CM

## 2017-09-09 DIAGNOSIS — I480 Paroxysmal atrial fibrillation: Secondary | ICD-10-CM

## 2017-09-09 DIAGNOSIS — I2510 Atherosclerotic heart disease of native coronary artery without angina pectoris: Secondary | ICD-10-CM

## 2017-09-09 DIAGNOSIS — E7800 Pure hypercholesterolemia, unspecified: Secondary | ICD-10-CM | POA: Diagnosis not present

## 2017-09-09 NOTE — Progress Notes (Signed)
Cardiology Office Note   Date:  09/09/2017   ID:  AIKEEM LILLEY, DOB [birth year], MRN [id number]  PCP:  Derinda Late, MD  Cardiologist:   Kathlyn Sacramento, MD   Chief Complaint  Patient presents with  . Other    6 month follow up. Patient denies chest pain and SOB at this time. Meds reviewed verbally with patient.       History of Present Illness: Benjamin Brewer is a 84 y.o. male who presents for a followup visit regarding coronary artery disease and paroxysmal atrial fibrillation . He has known history of coronary artery disease status post angioplasty and drug-eluting stent placement to the LAD and RCA in 2009.  Most recent nuclear stress test in 2013 showed no evidence of ischemia with normal ejection fraction.  He had one episode of atrial fibrillation with rapid ventricular response in 2015 and has been on anticoagulation since then. He has not had any recurrent episodes since that time. Echocardiogram in 2016 showed an ejection fraction of 55-60%, mild mitral regurgitation and mildly dilated right atrium.  He has been doing well since last visit with no recurrent palpitations, shortness of breath or chest pain.  He takes his medications regularly with no reported side effects.  Continues to go to the gym regularly with no symptoms.  Past Medical History:  Diagnosis Date  . Benign prostatic hypertrophy    Hx of   . CAD (coronary artery disease)    native vessel. Status post LAD and RCA angioplasty and drug-eluting stent placement in 2009  . Dyslipidemia   . History of tachycardia   . Hyperlipidemia   . Hypertension   . Intermediate coronary syndrome (HCC)    angina, unstable  . Other dyspnea and respiratory abnormality    on exertion    Past Surgical History:  Procedure Laterality Date  . [place]; x2  . [place]  . ESOPHAGOGASTRODUODENOSCOPY (EGD) WITH  PROPOFOL N/A 06/10/2015   Procedure: ESOPHAGOGASTRODUODENOSCOPY (EGD) WITH PROPOFOL;  Surgeon: Manya Silvas, MD;  Location[facility] ENDOSCOPY;  Service: Endoscopy;  Laterality: N/A;  . HERNIA REPAIR    . SAVORY DILATION N/A 06/10/2015   Procedure: SAVORY DILATION;  Surgeon: Manya Silvas, MD;  Location: [facility] ENDOSCOPY;  Service: Endoscopy;  Laterality: N/A;  . TONSILLECTOMY       Current Outpatient Medications  Medication Sig Dispense Refill  . alfuzosin (UROXATRAL) 10 MG 24 hr tablet Take 10 mg by mouth daily.      Marland Kitchen atorvastatin (LIPITOR) 40 MG tablet take 1 tablet by mouth once daily 90 tablet 3  . cyanocobalamin 500 MCG tablet Take 500 mcg by mouth daily.    Marland Kitchen ELIQUIS 5 MG TABS tablet take 1 tablet by mouth twice a day 60 tablet 5  . ferrous sulfate (FERRO-BOB) 325 (65 FE) MG tablet Take 325 mg by mouth daily.      . finasteride (PROSCAR) 5 MG tablet Take 5 mg by mouth daily.    . fluticasone (FLONASE) 50 MCG/ACT nasal spray Place 1-2 sprays into both nostrils daily. (Patient taking differently: Place 1-2 sprays into both nostrils as needed. ) 16 g 2  . metoprolol succinate (TOPROL-XL) 50 MG 24 hr tablet Take 1 tablet (50 mg total) by mouth daily. Take with or immediately following a meal. 30 tablet 6  . Multiple Vitamins-Minerals (EYE VITAMINS PO)  Take by mouth 2 (two) times daily.     . nitroGLYCERIN (NITROSTAT) 0.4 MG SL tablet place 1 tablet under the tongue if needed every 5 minutes fo 25 tablet 3  . pantoprazole (PROTONIX) 40 MG tablet take 1 tablet by mouth once daily 30 tablet 6   No current facility-administered medications for this visit.     Allergies:   Terazosin    Social History:  The patient  reports that  has never smoked. he has never used smokeless tobacco. He reports that he drinks alcohol. He reports that he does not use drugs.   Family History:  The patient's family history includes Heart attack in his mother; Hypertension in his mother; Stroke in his  mother and unknown relative.    ROS:  Please see the history of present illness.   Otherwise, review of systems are positive for none.   All other systems are reviewed and negative.    PHYSICAL EXAM: VS:  BP 134/60 (BP Location: Left Arm, Patient Position: Sitting, Cuff Size: Normal)   Pulse 61   Ht 5\' 11"  (1.803 m)   Wt 212 lb 8 oz (96.4 kg)   BMI 29.64 kg/m  , BMI Body mass index is 29.64 kg/m. GEN: Well nourished, well developed, in no acute distress  HEENT: normal  Neck: no JVD, carotid bruits, or masses Cardiac: RRR; no murmurs, rubs, or gallops,no edema  Respiratory:  clear to auscultation bilaterally, normal work of breathing GI: soft, nontender, nondistended, + BS MS: no deformity or atrophy  Skin: warm and dry, no rash Neuro:  Strength and sensation are intact Psych: euthymic mood, full affect   EKG:  EKG is ordered today. The ekg ordered today demonstrates normal sinus rhythm with low voltage and no significant ST or T wave changes.  Possible old inferior infarct.  Recent Labs: 02/18/2017: BUN 18; Creatinine, Ser 1.48; Hemoglobin 13.6; Platelets 208; Potassium 4.0; Sodium 138    Lipid Panel    Component Value Date/Time   CHOL 115 10/08/2012 0554   TRIG 75 10/08/2012 0554   HDL 43 10/08/2012 0554   VLDL 15 10/08/2012 0554   LDLCALC 57 10/08/2012 0554      Wt Readings from Last 3 Encounters:  09/09/17 212 lb 8 oz (96.4 kg)  03/08/17 205 lb 4 oz (93.1 kg)  02/18/17 205 lb (93 kg)        ASSESSMENT AND PLAN:  1.  Coronary artery disease involving native coronary arteries without angina:  Continue medical therapy. He is not on antiplatelet medications given that he is on long-term anticoagulation.  2. Paroxysmal atrial fibrillation: No recurrent atrial fibrillation since last visit.  Continue Toprol.  Continue anticoagulation with Eliquis. I reviewed most recent labs done in October 2018 which showed a creatinine of 1.3.  Hemoglobin was stable at 13.1.  If  creatinine goes above 1.5, we will have to decrease the dose of Eliquis to 2.5 mg twice daily.  3. Essential hypertension: Blood pressure is reasonably controlled.   4. Hyperlipidemia: Continue treatment with atorvastatin .  Most recent lipid profile in October showed an LDL of 75 which is close to target.   Disposition:   FU with me in 6 months  Signed,  Kathlyn Sacramento, MD  09/09/2017 8:59 AM    [place]

## 2017-09-09 NOTE — Patient Instructions (Signed)
Medication Instructions: Continue same medications.   Labwork: None.   Procedures/Testing: None.   Follow-Up: 6 months with Dr. Arida.   Any Additional Special Instructions Will Be Listed Below (If Applicable).     If you need a refill on your cardiac medications before your next appointment, please call your pharmacy.   

## 2017-11-01 ENCOUNTER — Other Ambulatory Visit: Payer: Self-pay | Admitting: Cardiovascular Disease

## 2017-11-01 ENCOUNTER — Other Ambulatory Visit: Payer: Self-pay

## 2017-11-01 DIAGNOSIS — Z7901 Long term (current) use of anticoagulants: Secondary | ICD-10-CM

## 2017-11-01 NOTE — Telephone Encounter (Signed)
Refill Request.  

## 2017-11-01 NOTE — Telephone Encounter (Signed)
Pt needs BMET!!!! 

## 2017-11-01 NOTE — Telephone Encounter (Signed)
Sent refill request to [facility], Per [employer] in [place] clinic patient needs BMET.

## 2017-11-01 NOTE — Telephone Encounter (Signed)
Please review for refill, Thanks !  

## 2017-11-05 ENCOUNTER — Other Ambulatory Visit: Payer: Self-pay | Admitting: Cardiovascular Disease

## 2017-11-05 NOTE — Telephone Encounter (Signed)
Please review for refill, Thanks !  

## 2017-11-12 DIAGNOSIS — F4321 Adjustment disorder with depressed mood: Secondary | ICD-10-CM | POA: Diagnosis not present

## 2017-11-12 DIAGNOSIS — I10 Essential (primary) hypertension: Secondary | ICD-10-CM | POA: Diagnosis not present

## 2017-11-12 DIAGNOSIS — I480 Paroxysmal atrial fibrillation: Secondary | ICD-10-CM | POA: Diagnosis not present

## 2017-11-12 DIAGNOSIS — I2510 Atherosclerotic heart disease of native coronary artery without angina pectoris: Secondary | ICD-10-CM | POA: Diagnosis not present

## 2017-11-12 DIAGNOSIS — Z79899 Other long term (current) drug therapy: Secondary | ICD-10-CM | POA: Diagnosis not present

## 2017-11-12 DIAGNOSIS — E7800 Pure hypercholesterolemia, unspecified: Secondary | ICD-10-CM | POA: Diagnosis not present

## 2017-12-02 ENCOUNTER — Other Ambulatory Visit: Payer: Self-pay | Admitting: Cardiovascular Disease

## 2017-12-24 ENCOUNTER — Other Ambulatory Visit: Payer: Self-pay | Admitting: Cardiovascular Disease

## 2017-12-31 ENCOUNTER — Other Ambulatory Visit: Payer: Self-pay | Admitting: Cardiovascular Disease

## 2017-12-31 NOTE — Telephone Encounter (Signed)
Refill Request.  

## 2018-01-10 ENCOUNTER — Telehealth: Payer: Self-pay | Admitting: Cardiovascular Disease

## 2018-01-10 NOTE — Telephone Encounter (Signed)
Pt c/o swelling: STAT is pt has developed SOB within 24 hours  1) How much weight have you gained and in what time span? No weight gain  2) If swelling, where is the swelling located? Bilateral ankles  3) Are you currently taking a fluid pill? no  4) Are you currently SOB? No   5) Do you have a log of your daily weights (if so, list)?   6) Have you gained 3 pounds in a day or 5 pounds in a week? no  7) Have you traveled recently? This past weekend.  Pt states he is also having palpitations., states left arm has been going to sleep. Denies any Chest pain.

## 2018-01-11 ENCOUNTER — Ambulatory Visit (INDEPENDENT_AMBULATORY_CARE_PROVIDER_SITE_OTHER): Payer: PPO | Admitting: Physician Assistant

## 2018-01-11 ENCOUNTER — Encounter: Payer: Self-pay | Admitting: Physician Assistant

## 2018-01-11 VITALS — BP 146/76 | HR 52 | Ht 71.0 in | Wt 210.5 lb

## 2018-01-11 DIAGNOSIS — I5033 Acute on chronic diastolic (congestive) heart failure: Secondary | ICD-10-CM | POA: Diagnosis not present

## 2018-01-11 DIAGNOSIS — I2510 Atherosclerotic heart disease of native coronary artery without angina pectoris: Secondary | ICD-10-CM

## 2018-01-11 DIAGNOSIS — R001 Bradycardia, unspecified: Secondary | ICD-10-CM

## 2018-01-11 DIAGNOSIS — R600 Localized edema: Secondary | ICD-10-CM

## 2018-01-11 DIAGNOSIS — E785 Hyperlipidemia, unspecified: Secondary | ICD-10-CM

## 2018-01-11 DIAGNOSIS — I480 Paroxysmal atrial fibrillation: Secondary | ICD-10-CM | POA: Diagnosis not present

## 2018-01-11 DIAGNOSIS — R002 Palpitations: Secondary | ICD-10-CM

## 2018-01-11 DIAGNOSIS — I10 Essential (primary) hypertension: Secondary | ICD-10-CM

## 2018-01-11 MED ORDER — TORSEMIDE 20 MG PO TABS: 20.0000 mg | ORAL_TABLET | Freq: Every day | ORAL | 6 refills | Status: DC

## 2018-01-11 MED ORDER — METOPROLOL SUCCINATE ER 50 MG PO TB24: ORAL_TABLET | ORAL | Status: DC

## 2018-01-11 NOTE — Telephone Encounter (Signed)
I spoke with the patient. He states that for the last few days he has been having increased swelling to his bilateral ankles.  He has noticed more "heart skips" as well along with some left arm numbness. He is occasionally SOB. He does not feel as though his heart is racing.  He states his wife passed away on 01-Oct-2017 and he is unsure if any of his symptoms are related to this.  He does check his BP/ HR's at home and feels they have been ok, although no readings are provided.    I offered him a follow up appointment today to evaluate his symptoms. He was agreeable. He will see Christell Faith, PA at 3:00 pm today.

## 2018-01-11 NOTE — Telephone Encounter (Signed)
I left a message for the patient to call. 

## 2018-01-11 NOTE — Progress Notes (Signed)
Cardiology Office Note Date:  01/11/2018  Patient ID:  Benjamin, Brewer 03-16-1931, MRN [id number] PCP:  Derinda Late, MD  Cardiologist:  Dr. Fletcher Anon, MD    Chief Complaint: Lower extremity swelling/abdominal fullness/palpitations  History of Present Illness: Benjamin Brewer is a 84 y.o. male with history of CAD, PAF on Eliquis, HTN, and HLD who presents for evaluation of 1-2 weeks of lower extremity swelling, palpitations, dizziness, and abdominal fullness.  Patient underwent PCI/DES to the LAD and RCA in October 25, 2007. Follow up nuclear stress testing in 2011/10/25 showed no evidence of ischemia with normal EF. In 2013-10-24, he had an episode of Afib with RVR and has been on Eliquis and beta blocker since. Echo in 2014-10-25 showed an EF of 55-60%, mild MR, and a mildly dilated right atrium. He was most recently seen in the office in 08/2017 for routine follow up and was doing well. BP noted to be 134/60, weight 212 pounds (up from 205 pounds in 02/2017). Most recent labs from 11/2017 with a SCr of 1.5, K+ 4.1, WBC 8.7, HGB 13.2, PLT 224, LDL 55.   Sadly, the patient's wife passed away in October 24, 2017. He continues to grieve.  Patient called our office on 7/1 with several day history of bilateral ankle edema and palpitations with left arm paresthesias.   Patient comes in today noting a 1-2 weeks of increased ankle edema, abdominal fullness, palpitations, and dizziness. No changes to his diet. No orthopnea. No cough. No chest pain. He states "sometimes, it feels like my heart beats are too spaced out." Heart rate at home has been in the upper 50s to low 60s bpm. No symptoms consistent with his prior Afib. Has not missed any doses of Eliquis. BP at home typically running in the 637C to 588F systolic. Weight has been in the low 200s to 212 pounds at home. Has had to let his belt out one notch secondary to abdominal fullness.   Past Medical History:  Diagnosis Date  . Benign prostatic hypertrophy    Hx of   . CAD  (coronary artery disease)    native vessel. Status post LAD and RCA angioplasty and drug-eluting stent placement in 10-25-2007  . Dyslipidemia   . History of tachycardia   . Hyperlipidemia   . Hypertension   . Intermediate coronary syndrome (HCC)    angina, unstable  . Other dyspnea and respiratory abnormality    on exertion    Past Surgical History:  Procedure Laterality Date  . [place]; x2  . [place]  . ESOPHAGOGASTRODUODENOSCOPY (EGD) WITH PROPOFOL N/A 06/10/2015   Procedure: ESOPHAGOGASTRODUODENOSCOPY (EGD) WITH PROPOFOL;  Surgeon: Manya Silvas, MD;  Location: [facility] ENDOSCOPY;  Service: Endoscopy;  Laterality: N/A;  . HERNIA REPAIR    . SAVORY DILATION N/A 06/10/2015   Procedure: SAVORY DILATION;  Surgeon: Manya Silvas, MD;  Location: [facility] ENDOSCOPY;  Service: Endoscopy;  Laterality: N/A;  . TONSILLECTOMY      Current Meds  Medication Sig  . alfuzosin (UROXATRAL) 10 MG 24 hr tablet Take 10 mg by mouth daily.    Marland Kitchen atorvastatin (LIPITOR) 40 MG tablet TAKE 1 TABLET BY MOUTH ONCE DAILY  . cyanocobalamin 500 MCG tablet Take 500 mcg by mouth daily.  Marland Kitchen ELIQUIS 5 MG TABS tablet TAKE 1 TABLET BY MOUTH TWICE DAILY  . ferrous sulfate (FERRO-BOB) 325 (65 FE) MG  tablet Take 325 mg by mouth daily.    . finasteride (PROSCAR) 5 MG tablet Take 5 mg by mouth daily.  . fluticasone (FLONASE) 50 MCG/ACT nasal spray Place 1-2 sprays into both nostrils daily. (Patient taking differently: Place 1-2 sprays into both nostrils as needed. )  . metoprolol succinate (TOPROL-XL) 50 MG 24 hr tablet Take 1 tablet (50 mg total) by mouth daily. Take with or immediately following a meal.  . Multiple Vitamins-Minerals (EYE VITAMINS PO) Take by mouth 2 (two) times daily.   . nitroGLYCERIN (NITROSTAT) 0.4 MG SL tablet place 1 tablet under the tongue if needed every 5 minutes fo  . pantoprazole (PROTONIX) 40 MG  tablet TAKE 1 TABLET BY MOUTH ONCE DAILY    Allergies:   Terazosin   Social History:  The patient  reports that he has never smoked. He has never used smokeless tobacco. He reports that he drinks alcohol. He reports that he does not use drugs.   Family History:  The patient's family history includes Heart attack in his mother; Hypertension in his mother; Stroke in his mother and unknown relative.  ROS:   Review of Systems  Constitutional: Positive for malaise/fatigue. Negative for chills, diaphoresis, fever and weight loss.  HENT: Negative for congestion.   Eyes: Negative for discharge and redness.  Respiratory: Positive for shortness of breath. Negative for cough, hemoptysis, sputum production and wheezing.   Cardiovascular: Positive for palpitations and leg swelling. Negative for chest pain, orthopnea, claudication and PND.  Gastrointestinal: Negative for abdominal pain, blood in stool, heartburn, melena, nausea and vomiting.  Genitourinary: Negative for hematuria.  Musculoskeletal: Negative for falls and myalgias.  Skin: Negative for rash.  Neurological: Positive for dizziness and weakness. Negative for tingling, tremors, sensory change, speech change, focal weakness and loss of consciousness.  Endo/Heme/Allergies: Does not bruise/bleed easily.  Psychiatric/Behavioral: Negative for substance abuse. The patient is not nervous/anxious.   All other systems reviewed and are negative.    PHYSICAL EXAM:  VS:  BP (!) 146/76 (BP Location: Left Arm, Patient Position: Sitting, Cuff Size: Normal)   Pulse (!) 52   Ht 5\' 11"  (1.803 m)   Wt 210 lb 8 oz (95.5 kg)   BMI 29.36 kg/m  BMI: Body mass index is 29.36 kg/m.  Physical Exam  Constitutional: He is oriented to person, place, and time. He appears well-developed and well-nourished.  HENT:  Head: Normocephalic and atraumatic.  Eyes: Right eye exhibits no discharge. Left eye exhibits no discharge.  Neck: Normal range of motion. No JVD  present.  Cardiovascular: Regular rhythm, S1 normal, S2 normal and normal heart sounds. Bradycardia present. Exam reveals no distant heart sounds, no friction rub, no midsystolic click and no opening snap.  No murmur heard. Pulses:      Posterior tibial pulses are 2+ on the right side, and 2+ on the left side.  Pulmonary/Chest: Effort normal and breath sounds normal. No respiratory distress. He has no decreased breath sounds. He has no wheezes. He has no rales. He exhibits no tenderness.  Abdominal: Soft. He exhibits distension. There is no tenderness.  Musculoskeletal: He exhibits edema.  1+ bilateral ankle edema.   Neurological: He is alert and oriented to person, place, and time.  Skin: Skin is warm and dry. No cyanosis. Nails show no clubbing.  Psychiatric: He has a normal mood and affect. His speech is normal and behavior is normal. Judgment and thought content normal.     EKG:  Was ordered and interpreted by  me today. Shows sinus bradycardia, 52 bpm, inferior Q waves, no acute st/t changes  Recent Labs: 02/18/2017: BUN 18; Creatinine, Ser 1.48; Hemoglobin 13.6; Platelets 208; Potassium 4.0; Sodium 138  No results found for requested labs within last 8760 hours.   CrCl cannot be calculated (Patient's most recent lab result is older than the maximum 21 days allowed.).   Wt Readings from Last 3 Encounters:  01/11/18 210 lb 8 oz (95.5 kg)  09/09/17 212 lb 8 oz (96.4 kg)  03/08/17 205 lb 4 oz (93.1 kg)     Other studies reviewed: Additional studies/records reviewed today include: summarized above  ASSESSMENT AND PLAN:  1. Acute on chronic diastolic CHF: He does appear volume up. Given his abdominal fullness, we will start him on torsemide 20 mg daily. Check a BMP, BNP, TSH, and CBC today. Follow up BMP on 7/8. Check echo. If his EF is found to be newly reduced evaluate for potential ischemia, unless there is clear indication this is stress-induced cardiomyopathy in the setting of the  loss of his wife.   2. Palpitations/sinus bradycardia/dizziness: Decrease Toprol XL to 25 mg daily. Check BMP, TSH. Schedule 48-hour Holter monitor. BP stable. No syncope.   3. CAD of native coronary arteries without angina: No symptoms consistent with angina at this time. On Eliquis in place of ASA. If his echo demonstrates findings concerning for ischemia we would need to revisit potential ischemic evaluations. Continue secondary prevention.   4. PAF: Maintaining sinus rhythm with a bradycardic rate as above. Decrease metoprolol as above. Continue Eliquis 5 mg bid, for now. If his SCr is > 1.5 today he would qualify for reduced dose Eliquis. May be more efficient to change to Xarelto based on his CrCl. Await labs prior to making a final decision on anticoagulation.    5. HTN: BP mildly elevated today, though patient has not slept well. Toprol decreased as above given bradycardia, added torsemide as above.   6. HLD: LDL of 55 from 11/2017. Continue Lipitor.   Disposition: F/u with RN visit on 7/8 for labs and with an APP in 2 weeks.   Current medicines are reviewed at length with the patient today.  The patient did not have any concerns regarding medicines.  Signed, Christell Faith, PA-C 01/11/2018 3:42 PM     [address] [place], [place] [phone]

## 2018-01-11 NOTE — Patient Instructions (Addendum)
Medication Instructions: - Your physician has recommended you make the following change in your medication:   1) START demadex (torsemide) 20 mg- take 1 tablet by mouth once daily 2) DECREASE toprol (metoprolol succinate) 50 mg- take 1/2 tablet (25 mg) by mouth once daily  Labwork: - Your physician recommends that you have lab work today: BMP/ BNP/ TSH/ CBC  - Your physician recommends that you return for lab work: Monday 01/17/18- BMP  Procedures/Testing: - Your physician has requested that you have an echocardiogram. Echocardiography is a painless test that uses sound waves to create images of your heart. It provides your doctor with information about the size and shape of your heart and how well your heart's chambers and valves are working. This procedure takes approximately one hour. There are no restrictions for this procedure.  - Your physician has recommended that you wear a 48 hour holter monitor. Holter monitors are medical devices that record the heart's electrical activity. Doctors most often use these monitors to diagnose arrhythmias. Arrhythmias are problems with the speed or rhythm of the heartbeat. The monitor is a small, portable device. You can wear one while you do your normal daily activities. This is usually used to diagnose what is causing palpitations/syncope (passing out).  Follow-Up: - Your physician recommends that you schedule a follow-up appointment in: 2 weeks- Wednesday 01/26/18 at 1:30 pm with Christell Faith, PA   Any Additional Special Instructions Will Be Listed Below (If Applicable).     If you need a refill on your cardiac medications before your next appointment, please call your pharmacy.

## 2018-01-12 ENCOUNTER — Telehealth: Payer: Self-pay | Admitting: Cardiovascular Disease

## 2018-01-12 ENCOUNTER — Ambulatory Visit (INDEPENDENT_AMBULATORY_CARE_PROVIDER_SITE_OTHER): Payer: PPO

## 2018-01-12 ENCOUNTER — Encounter: Payer: Self-pay | Admitting: Emergency Medicine

## 2018-01-12 ENCOUNTER — Emergency Department
Admission: EM | Admit: 2018-01-12 | Discharge: 2018-01-12 | Disposition: A | Discharge status: Home / Self Care | Payer: PPO | Attending: Emergency Medicine | Admitting: Emergency Medicine

## 2018-01-12 ENCOUNTER — Emergency Department: Payer: PPO

## 2018-01-12 ENCOUNTER — Other Ambulatory Visit: Payer: Self-pay

## 2018-01-12 DIAGNOSIS — I471 Supraventricular tachycardia, unspecified: Secondary | ICD-10-CM

## 2018-01-12 DIAGNOSIS — Z79899 Other long term (current) drug therapy: Secondary | ICD-10-CM | POA: Diagnosis not present

## 2018-01-12 DIAGNOSIS — I10 Essential (primary) hypertension: Secondary | ICD-10-CM | POA: Diagnosis not present

## 2018-01-12 DIAGNOSIS — R200 Anesthesia of skin: Secondary | ICD-10-CM | POA: Diagnosis not present

## 2018-01-12 DIAGNOSIS — R202 Paresthesia of skin: Secondary | ICD-10-CM

## 2018-01-12 DIAGNOSIS — R001 Bradycardia, unspecified: Secondary | ICD-10-CM | POA: Diagnosis not present

## 2018-01-12 DIAGNOSIS — R002 Palpitations: Secondary | ICD-10-CM

## 2018-01-12 DIAGNOSIS — I2510 Atherosclerotic heart disease of native coronary artery without angina pectoris: Secondary | ICD-10-CM | POA: Insufficient documentation

## 2018-01-12 DIAGNOSIS — H538 Other visual disturbances: Secondary | ICD-10-CM | POA: Diagnosis not present

## 2018-01-12 HISTORY — DX: Supraventricular tachycardia, unspecified: I47.10

## 2018-01-12 LAB — CBC WITH DIFFERENTIAL/PLATELET
Basophils Absolute: 0 10*3/uL (ref 0.0–0.2)
Basos: 1 %
EOS (ABSOLUTE): 0.2 10*3/uL (ref 0.0–0.4)
EOS: 4 %
HEMATOCRIT: 37.9 % (ref 37.5–51.0)
HEMOGLOBIN: 13.2 g/dL (ref 13.0–17.7)
IMMATURE GRANULOCYTES: 0 %
Immature Grans (Abs): 0 10*3/uL (ref 0.0–0.1)
LYMPHS: 15 %
Lymphocytes Absolute: 1 10*3/uL (ref 0.7–3.1)
MCH: 30.7 pg (ref 26.6–33.0)
MCHC: 34.8 g/dL (ref 31.5–35.7)
MCV: 88 fL (ref 79–97)
MONOCYTES: 12 %
Monocytes Absolute: 0.7 10*3/uL (ref 0.1–0.9)
NEUTROS PCT: 68 %
Neutrophils Absolute: 4.4 10*3/uL (ref 1.4–7.0)
Platelets: 254 10*3/uL (ref 150–450)
RBC: 4.3 x10E6/uL (ref 4.14–5.80)
RDW: 13.9 % (ref 12.3–15.4)
WBC: 6.4 10*3/uL (ref 3.4–10.8)

## 2018-01-12 LAB — BASIC METABOLIC PANEL
BUN/Creatinine Ratio: 16 (ref 10–24)
BUN: 19 mg/dL (ref 8–27)
CALCIUM: 9.4 mg/dL (ref 8.6–10.2)
CO2: 21 mmol/L (ref 20–29)
Chloride: 107 mmol/L — ABNORMAL HIGH (ref 96–106)
Creatinine, Ser: 1.21 mg/dL (ref 0.76–1.27)
GFR, EST AFRICAN AMERICAN: 63 mL/min/{1.73_m2} (ref >59)
GFR, EST NON AFRICAN AMERICAN: 54 mL/min/{1.73_m2} — AB (ref >59)
Glucose: 93 mg/dL (ref 65–99)
Potassium: 4.2 mmol/L (ref 3.5–5.2)
Sodium: 143 mmol/L (ref 134–144)

## 2018-01-12 LAB — DIFFERENTIAL
BASOS PCT: 1 %
Basophils Absolute: 0 10*3/uL (ref 0–0.1)
EOS ABS: 0.2 10*3/uL (ref 0–0.7)
EOS PCT: 2 %
LYMPHS ABS: 0.9 10*3/uL — AB (ref 1.0–3.6)
Lymphocytes Relative: 11 %
MONO ABS: 0.8 10*3/uL (ref 0.2–1.0)
MONOS PCT: 9 %
Neutro Abs: 6.6 10*3/uL — ABNORMAL HIGH (ref 1.4–6.5)
Neutrophils Relative %: 77 %

## 2018-01-12 LAB — BRAIN NATRIURETIC PEPTIDE: BNP: 81 pg/mL (ref 0.0–100.0)

## 2018-01-12 LAB — APTT: aPTT: 30 seconds (ref 24–36)

## 2018-01-12 LAB — TROPONIN I

## 2018-01-12 LAB — COMPREHENSIVE METABOLIC PANEL
ALT: 20 U/L (ref 0–44)
AST: 24 U/L (ref 15–41)
Albumin: 4.8 g/dL (ref 3.5–5.0)
Alkaline Phosphatase: 97 U/L (ref 38–126)
Anion gap: 9 (ref 5–15)
BILIRUBIN TOTAL: 1.1 mg/dL (ref 0.3–1.2)
BUN: 23 mg/dL (ref 8–23)
CHLORIDE: 104 mmol/L (ref 98–111)
CO2: 26 mmol/L (ref 22–32)
Calcium: 9.9 mg/dL (ref 8.9–10.3)
Creatinine, Ser: 1.25 mg/dL — ABNORMAL HIGH (ref 0.61–1.24)
GFR, EST AFRICAN AMERICAN: 59 mL/min — AB (ref >60)
GFR, EST NON AFRICAN AMERICAN: 51 mL/min — AB (ref >60)
Glucose, Bld: 108 mg/dL — ABNORMAL HIGH (ref 70–99)
POTASSIUM: 3.8 mmol/L (ref 3.5–5.1)
Sodium: 139 mmol/L (ref 135–145)
TOTAL PROTEIN: 7.6 g/dL (ref 6.5–8.1)

## 2018-01-12 LAB — CBC
HCT: 42.2 % (ref 40.0–52.0)
Hemoglobin: 14.4 g/dL (ref 13.0–18.0)
MCH: 30.7 pg (ref 26.0–34.0)
MCHC: 34.1 g/dL (ref 32.0–36.0)
MCV: 90.1 fL (ref 80.0–100.0)
Platelets: 272 10*3/uL (ref 150–440)
RBC: 4.68 MIL/uL (ref 4.40–5.90)
RDW: 13.9 % (ref 11.5–14.5)
WBC: 8.6 10*3/uL (ref 3.8–10.6)

## 2018-01-12 LAB — GLUCOSE, CAPILLARY: GLUCOSE-CAPILLARY: 104 mg/dL — AB (ref 70–99)

## 2018-01-12 LAB — TSH: TSH: 1.97 u[IU]/mL (ref 0.450–4.500)

## 2018-01-12 LAB — PROTIME-INR
INR: 1.11
Prothrombin Time: 14.2 seconds (ref 11.4–15.2)

## 2018-01-12 MED ORDER — ASPIRIN 81 MG PO CHEW
324.0000 mg | CHEWABLE_TABLET | Freq: Once | ORAL | Status: AC
  Administered 2018-01-12: 324 mg via ORAL
  Filled 2018-01-12: qty 4

## 2018-01-12 MED ORDER — ASPIRIN EC 81 MG PO TBEC: 81.0000 mg | DELAYED_RELEASE_TABLET | Freq: Every day | ORAL | 0 refills | Status: AC

## 2018-01-12 NOTE — ED Provider Notes (Signed)
[facility] Emergency Department Provider Note  ____________________________________________  Time seen: Approximately 3:02 PM  I have reviewed the triage vital signs and the nursing notes.   HISTORY  Chief Complaint Numbness    HPI Benjamin Brewer is a 84 y.o. male with a history of CAD hypertension hyperlipidemia who complains of tingling in the left arm and left leg for the past 2 days, intermittent, no aggravating or alleviating factors.  Associated with blurry vision of bilateral eyes which is now improved.  No headaches.  No head trauma.  No fevers chills or neck stiffness.  Symptoms are mild to moderate in intensity.  Denies any history of stroke in the past.  No motor weakness.  No changes in balance coordination or walking.      Past Medical History:  Diagnosis Date  . Benign prostatic hypertrophy    Hx of   . CAD (coronary artery disease)    native vessel. Status post LAD and RCA angioplasty and drug-eluting stent placement in 2009  . Dyslipidemia   . History of tachycardia   . Hyperlipidemia   . Hypertension   . Intermediate coronary syndrome (HCC)    angina, unstable  . Other dyspnea and respiratory abnormality    on exertion     Patient Active Problem List   Diagnosis Date Noted  . Atrial fibrillation ([place]) 08/20/2014  . BPPV (benign paroxysmal positional vertigo) 10/23/2013  . Sinus bradycardia 06/16/2013  . Essential hypertension 06/16/2013  . CAD, NATIVE VESSEL 07/03/2009  . DYSPNEA ON EXERTION 12/21/2008  . Hyperlipidemia 12/15/2008  . BENIGN PROSTATIC HYPERTROPHY, HX OF 12/15/2008     Past Surgical History:  Procedure Laterality Date  . [place]; x2  . [place]  . ESOPHAGOGASTRODUODENOSCOPY (EGD) WITH PROPOFOL N/A 06/10/2015   Procedure: ESOPHAGOGASTRODUODENOSCOPY (EGD) WITH PROPOFOL;  Surgeon: Manya Silvas, MD;   Location: [facility] ENDOSCOPY;  Service: Endoscopy;  Laterality: N/A;  . HERNIA REPAIR    . SAVORY DILATION N/A 06/10/2015   Procedure: SAVORY DILATION;  Surgeon: Manya Silvas, MD;  Location: [facility] ENDOSCOPY;  Service: Endoscopy;  Laterality: N/A;  . TONSILLECTOMY       Prior to Admission medications   Medication Sig Start Date End Date Taking? Authorizing Provider  atorvastatin (LIPITOR) 40 MG tablet TAKE 1 TABLET BY MOUTH ONCE DAILY 12/24/17  Yes Wellington Hampshire, MD  cyanocobalamin 500 MCG tablet Take 500 mcg by mouth daily.   Yes [provider]  ELIQUIS 5 MG TABS tablet TAKE 1 TABLET BY MOUTH TWICE DAILY 01/03/18  Yes Wellington Hampshire, MD  ferrous sulfate (FERRO-BOB) 325 (65 FE) MG tablet Take 325 mg by mouth daily.     Yes [provider]  metoprolol succinate (TOPROL-XL) 50 MG 24 hr tablet Take 1/2 tablet (25 mg) by mouth once daily. Take with or immediately following a meal. 01/11/18  Yes Dunn, Areta Haber, PA-C  Multiple Vitamins-Minerals (EYE VITAMINS PO) Take by mouth 2 (two) times daily.    Yes [provider]  nitroGLYCERIN (NITROSTAT) 0.4 MG SL tablet place 1 tablet under the tongue if needed every 5 minutes fo 03/29/17  Yes Wellington Hampshire, MD  pantoprazole (PROTONIX) 40 MG tablet TAKE 1 TABLET BY MOUTH ONCE DAILY 12/02/17  Yes Wellington Hampshire, MD  torsemide (DEMADEX) 20 MG tablet Take 1 tablet (20 mg total) by mouth  daily. 01/11/18  Yes Dunn, Areta Haber, PA-C  traZODone (DESYREL) 50 MG tablet Take 1 tablet by mouth at bedtime. 11/12/17  Yes [provider]  fluticasone (FLONASE) 50 MCG/ACT nasal spray Place 1-2 sprays into both nostrils daily. Patient taking differently: Place 1-2 sprays into both nostrils as needed.  10/23/13   Arguello, Roger A, PA-C     Allergies Terazosin   Family History  Problem Relation Age of Onset  . Stroke Mother   . Heart attack Mother   . Hypertension Mother   . Stroke Unknown        family history    Social  History Social History   Tobacco Use  . Smoking status: Never Smoker  . Smokeless tobacco: Never Used  Substance Use Topics  . Alcohol use: Yes    Comment: occasional   . Drug use: No    Review of Systems  Constitutional:   No fever or chills.   Cardiovascular:   No chest pain or syncope. Respiratory:   No dyspnea or cough. Gastrointestinal:   Negative for abdominal pain, vomiting and diarrhea.  Musculoskeletal:   Negative for focal pain or swelling All other systems reviewed and are negative except as documented above in ROS and HPI.  ____________________________________________   PHYSICAL EXAM:  VITAL SIGNS: ED Triage Vitals [01/12/18 1215]  Enc Vitals Group     BP (!) 167/90     Pulse Rate 70     Resp 18     Temp 98 F (36.7 C)     Temp Source Oral     SpO2 98 %     Weight 210 lb (95.3 kg)     Height 5\' 11"  (1.803 m)     Head Circumference      Peak Flow      Pain Score 0     Pain Loc      Pain Edu?      Excl. in [place]?     Vital signs reviewed, nursing assessments reviewed.   Constitutional:   Alert and oriented. Non-toxic appearance. Eyes:   Conjunctivae are normal. EOMI. PERRL. ENT      Head:   Normocephalic and atraumatic.      Nose:   No congestion/rhinnorhea.       Mouth/Throat:   MMM, no pharyngeal erythema. No peritonsillar mass.       Neck:   No meningismus. Full ROM. Hematological/Lymphatic/Immunilogical:   No cervical lymphadenopathy. Cardiovascular:   RRR. Symmetric bilateral radial and DP pulses.  No murmurs.  Respiratory:   Normal respiratory effort without tachypnea/retractions. Breath sounds are clear and equal bilaterally. No wheezes/rales/rhonchi. Gastrointestinal:   Soft and nontender. Non distended. There is no CVA tenderness.  No rebound, rigidity, or guarding. Genitourinary:   deferred Musculoskeletal:   Normal range of motion in all extremities. No joint effusions.  No lower extremity tenderness.  No edema. Neurologic:   Normal  speech and language. Cranial nerves II through XII intact Motor grossly intact. No pronator drift.  Normal gait. No acute focal neurologic deficits are appreciated.  Skin:    Skin is warm, dry and intact. No rash noted.  No petechiae, purpura, or bullae.  ____________________________________________    LABS (pertinent positives/negatives) (all labs ordered are listed, but only abnormal results are displayed) Labs Reviewed  DIFFERENTIAL - Abnormal; Notable for the following components:      Result Value   Neutro Abs 6.6 (*)    Lymphs Abs 0.9 (*)    All  other components within normal limits  COMPREHENSIVE METABOLIC PANEL - Abnormal; Notable for the following components:   Glucose, Bld 108 (*)    Creatinine, Ser 1.25 (*)    GFR calc non Af Amer 51 (*)    GFR calc Af Amer 59 (*)    All other components within normal limits  GLUCOSE, CAPILLARY - Abnormal; Notable for the following components:   Glucose-Capillary 104 (*)    All other components within normal limits  PROTIME-INR  APTT  CBC  TROPONIN I  CBG MONITORING, ED   ____________________________________________   EKG  Interpreted by me Normal sinus rhythm rate of 63, normal axis and intervals.  Poor R wave progression in anterior precordial leads.  Normal ST segments and T waves.  ____________________________________________    RADIOLOGY  Ct Head Wo Contrast  Result Date: 01/12/2018 CLINICAL DATA:  Left arm numbness. EXAM: CT HEAD WITHOUT CONTRAST TECHNIQUE: Contiguous axial images were obtained from the base of the skull through the vertex without intravenous contrast. COMPARISON:  CT scan of May 19, 2011. FINDINGS: Brain: No evidence of acute infarction, hemorrhage, hydrocephalus, extra-axial collection or mass lesion/mass effect. Vascular: No hyperdense vessel or unexpected calcification. Skull: Normal. Negative for fracture or focal lesion. Sinuses/Orbits: No acute finding. Other: None. IMPRESSION: Normal head  CT. Electronically Signed   By: Marijo Conception, M.D.   On: 01/12/2018 12:52    ____________________________________________   PROCEDURES Procedures  ____________________________________________  DIFFERENTIAL DIAGNOSIS   Nonspecific paresthesia, sensory stroke  CLINICAL IMPRESSION / ASSESSMENT AND PLAN / ED COURSE  Pertinent labs & imaging results that were available during my care of the patient were reviewed by me and considered in my medical decision making (see chart for details).    Patient well-appearing and nontoxic, unremarkable vital signs, presents with paresthesias of the left side.  He relates it to stress from his spouse dying within the last couple of months.  Neurologically he is intact with a normal exam, but due to his isolated sensory symptoms, lateralizing, I will obtain an MRI in the ED to evaluate for stroke.  If negative I think he can be discharged home and follow-up with primary care and cardiology for continued outpatient stroke work-up.  He already has a echocardiogram scheduled in 2 days.  He has a Holter monitor on right now.  He is on Eliquis.      ____________________________________________   FINAL CLINICAL IMPRESSION(S) / ED DIAGNOSES    Final diagnoses:  Paresthesia     ED Discharge Orders    None      Portions of this note were generated with dragon dictation software. Dictation errors may occur despite best attempts at proofreading.    Carrie Mew, MD 01/12/18 1505

## 2018-01-12 NOTE — ED Notes (Signed)
Pt walked to triage with no limp, drove to office with no issues per pt.

## 2018-01-12 NOTE — ED Notes (Signed)
Pt ambulated to bathroom and back to stretcher with NAD.

## 2018-01-12 NOTE — ED Notes (Signed)
Pt states he has been under a lot of stress lately, his wife passed away in 10-21-2022, tearful in triage.

## 2018-01-12 NOTE — ED Triage Notes (Signed)
Pt states left arm numbness that began a few days ago, heart palpitations since yesterday, today, states some tingling'/numb feeling in his left leg, blurred vision off and on for a few days as well denies HA. Pt seen at heart clinic this am and Holter monitor applied. Speech clear, face symmetric.

## 2018-01-12 NOTE — ED Notes (Signed)
Patient transported to MRI 

## 2018-01-12 NOTE — Telephone Encounter (Signed)
I evaluated the patient in the office today. He was here for a 48 hour holter to be placed. He states that he has had more palpitations today. His pulse sounds regular in the office today. He reports that the left arm numbness that he was having yesterday has progressed to left leg numbness & visual disturbance. He states he drove here today, but had trouble reading the license plate in front of him.  He did take his fluid pill this morning and has urinated about 3-4 times since taking this.   I advised the patient with the progressive numbness that is now in his lower left lower extremity and his visual disturbance that he may need further evaluation in the ER.  I reviewed the patient's symptoms today with Christell Faith, PA and he advised the patient report to the ER. The patient is agreeable.  I escorted the patient to the [facility] ER via wheelchair.  He was stable upon arrival. Report given to the ER nurse.  Advised to have the patient wait by the security desk until he was brought back. Inquired from the patient if his vision was getting worse.  He states it was about the same. Patient in no acute distress when I left the ER waiting area.

## 2018-01-12 NOTE — Discharge Instructions (Addendum)
The tests we did today are all within normal limits. Please follow-up with your cardiologist again to get the Holter monitor read. Please return for any further problems. Please also follow-up with your regular family doctor. They may want to send you to neurology for the numbness.

## 2018-01-12 NOTE — Telephone Encounter (Signed)
Pt calling stating left arm is numb, and he's having irregular heartbeat  He states he was seen yesterday but thinks he needs to be seen again Denies CP Does have some light sweating   Please advise

## 2018-01-12 NOTE — ED Provider Notes (Addendum)
MRI is negative patient is doing well as discussed with Dr. Joni Fears we will discharge the patient. He will follow up with cardiology and his regular doctor.   Nena Polio, MD 01/12/18 1738 discussed with Dr. Raeanne Barry on call for neurology. He concurs etc. would not start him on aspirin at this point. He is already on L course.   Nena Polio, MD 01/12/18 [phone]

## 2018-01-12 NOTE — Telephone Encounter (Signed)
Patient coming in today for a holter monitor to be placed.  Will discuss with the patient at that time.

## 2018-01-14 ENCOUNTER — Other Ambulatory Visit: Payer: Self-pay

## 2018-01-14 ENCOUNTER — Encounter (HOSPITAL_COMMUNITY): Payer: Self-pay

## 2018-01-14 ENCOUNTER — Observation Stay (HOSPITAL_COMMUNITY): Payer: PPO

## 2018-01-14 ENCOUNTER — Other Ambulatory Visit: Payer: PPO

## 2018-01-14 ENCOUNTER — Emergency Department (HOSPITAL_COMMUNITY): Payer: PPO

## 2018-01-14 ENCOUNTER — Observation Stay (HOSPITAL_COMMUNITY)
Admission: EM | Admit: 2018-01-14 | Discharge: 2018-01-15 | Disposition: A | Discharge status: Home / Self Care | Payer: PPO | Attending: Internal Medicine | Admitting: Internal Medicine

## 2018-01-14 DIAGNOSIS — Z9841 Cataract extraction status, right eye: Secondary | ICD-10-CM | POA: Diagnosis not present

## 2018-01-14 DIAGNOSIS — Z823 Family history of stroke: Secondary | ICD-10-CM | POA: Diagnosis not present

## 2018-01-14 DIAGNOSIS — Z7982 Long term (current) use of aspirin: Secondary | ICD-10-CM | POA: Insufficient documentation

## 2018-01-14 DIAGNOSIS — I5030 Unspecified diastolic (congestive) heart failure: Secondary | ICD-10-CM | POA: Diagnosis not present

## 2018-01-14 DIAGNOSIS — H8110 Benign paroxysmal vertigo, unspecified ear: Secondary | ICD-10-CM | POA: Diagnosis not present

## 2018-01-14 DIAGNOSIS — Z7901 Long term (current) use of anticoagulants: Secondary | ICD-10-CM | POA: Insufficient documentation

## 2018-01-14 DIAGNOSIS — R002 Palpitations: Secondary | ICD-10-CM | POA: Diagnosis not present

## 2018-01-14 DIAGNOSIS — I2510 Atherosclerotic heart disease of native coronary artery without angina pectoris: Secondary | ICD-10-CM

## 2018-01-14 DIAGNOSIS — N183 Chronic kidney disease, stage 3 unspecified: Secondary | ICD-10-CM | POA: Diagnosis present

## 2018-01-14 DIAGNOSIS — I480 Paroxysmal atrial fibrillation: Secondary | ICD-10-CM | POA: Insufficient documentation

## 2018-01-14 DIAGNOSIS — Z79899 Other long term (current) drug therapy: Secondary | ICD-10-CM | POA: Diagnosis not present

## 2018-01-14 DIAGNOSIS — I10 Essential (primary) hypertension: Secondary | ICD-10-CM

## 2018-01-14 DIAGNOSIS — Z888 Allergy status to other drugs, medicaments and biological substances status: Secondary | ICD-10-CM | POA: Insufficient documentation

## 2018-01-14 DIAGNOSIS — R531 Weakness: Secondary | ICD-10-CM | POA: Diagnosis not present

## 2018-01-14 DIAGNOSIS — Z8249 Family history of ischemic heart disease and other diseases of the circulatory system: Secondary | ICD-10-CM | POA: Insufficient documentation

## 2018-01-14 DIAGNOSIS — N179 Acute kidney failure, unspecified: Secondary | ICD-10-CM | POA: Insufficient documentation

## 2018-01-14 DIAGNOSIS — R200 Anesthesia of skin: Secondary | ICD-10-CM

## 2018-01-14 DIAGNOSIS — I4891 Unspecified atrial fibrillation: Secondary | ICD-10-CM | POA: Diagnosis not present

## 2018-01-14 DIAGNOSIS — I420 Dilated cardiomyopathy: Secondary | ICD-10-CM | POA: Diagnosis not present

## 2018-01-14 DIAGNOSIS — E785 Hyperlipidemia, unspecified: Secondary | ICD-10-CM | POA: Insufficient documentation

## 2018-01-14 DIAGNOSIS — I130 Hypertensive heart and chronic kidney disease with heart failure and stage 1 through stage 4 chronic kidney disease, or unspecified chronic kidney disease: Secondary | ICD-10-CM | POA: Insufficient documentation

## 2018-01-14 DIAGNOSIS — Z9889 Other specified postprocedural states: Secondary | ICD-10-CM | POA: Insufficient documentation

## 2018-01-14 DIAGNOSIS — M4854XA Collapsed vertebra, not elsewhere classified, thoracic region, initial encounter for fracture: Secondary | ICD-10-CM | POA: Diagnosis not present

## 2018-01-14 DIAGNOSIS — I069 Rheumatic aortic valve disease, unspecified: Secondary | ICD-10-CM | POA: Diagnosis not present

## 2018-01-14 DIAGNOSIS — N400 Enlarged prostate without lower urinary tract symptoms: Secondary | ICD-10-CM | POA: Diagnosis not present

## 2018-01-14 DIAGNOSIS — R222 Localized swelling, mass and lump, trunk: Secondary | ICD-10-CM | POA: Diagnosis not present

## 2018-01-14 DIAGNOSIS — F4322 Adjustment disorder with anxiety: Secondary | ICD-10-CM | POA: Insufficient documentation

## 2018-01-14 DIAGNOSIS — M25512 Pain in left shoulder: Secondary | ICD-10-CM | POA: Diagnosis not present

## 2018-01-14 LAB — CBC
HEMATOCRIT: 41.3 % (ref 39.0–52.0)
Hemoglobin: 13.7 g/dL (ref 13.0–17.0)
MCH: 30.5 pg (ref 26.0–34.0)
MCHC: 33.2 g/dL (ref 30.0–36.0)
MCV: 92 fL (ref 78.0–100.0)
Platelets: 218 10*3/uL (ref 150–400)
RBC: 4.49 MIL/uL (ref 4.22–5.81)
RDW: 13.1 % (ref 11.5–15.5)
WBC: 8.4 10*3/uL (ref 4.0–10.5)

## 2018-01-14 LAB — BASIC METABOLIC PANEL
Anion gap: 10 (ref 5–15)
BUN: 22 mg/dL (ref 8–23)
CALCIUM: 9.4 mg/dL (ref 8.9–10.3)
CO2: 27 mmol/L (ref 22–32)
CREATININE: 1.66 mg/dL — AB (ref 0.61–1.24)
Chloride: 102 mmol/L (ref 98–111)
GFR calc non Af Amer: 36 mL/min — ABNORMAL LOW (ref >60)
GFR, EST AFRICAN AMERICAN: 42 mL/min — AB (ref >60)
GLUCOSE: 94 mg/dL (ref 70–99)
Potassium: 3.9 mmol/L (ref 3.5–5.1)
Sodium: 139 mmol/L (ref 135–145)

## 2018-01-14 LAB — I-STAT TROPONIN, ED
TROPONIN I, POC: 0 ng/mL (ref 0.00–0.08)
Troponin i, poc: 0 ng/mL (ref 0.00–0.08)

## 2018-01-14 LAB — BRAIN NATRIURETIC PEPTIDE: B Natriuretic Peptide: 17.8 pg/mL (ref 0.0–100.0)

## 2018-01-14 MED ORDER — ACETAMINOPHEN 325 MG PO TABS
650.0000 mg | ORAL_TABLET | Freq: Four times a day (QID) | ORAL | Status: DC | PRN
  Administered 2018-01-15: 650 mg via ORAL
  Filled 2018-01-14: qty 2

## 2018-01-14 MED ORDER — APIXABAN 5 MG PO TABS
5.0000 mg | ORAL_TABLET | Freq: Two times a day (BID) | ORAL | Status: DC
  Administered 2018-01-15 (×2): 5 mg via ORAL
  Filled 2018-01-14 (×2): qty 1

## 2018-01-14 MED ORDER — CYANOCOBALAMIN 500 MCG PO TABS
500.0000 ug | ORAL_TABLET | Freq: Every day | ORAL | Status: DC
  Administered 2018-01-15: 500 ug via ORAL
  Filled 2018-01-14: qty 1

## 2018-01-14 MED ORDER — FAMOTIDINE IN NACL 20-0.9 MG/50ML-% IV SOLN: 20.0000 mg | Freq: Two times a day (BID) | INTRAVENOUS | Status: DC | PRN

## 2018-01-14 MED ORDER — ATORVASTATIN CALCIUM 40 MG PO TABS
40.0000 mg | ORAL_TABLET | Freq: Every day | ORAL | Status: DC
  Administered 2018-01-15: 40 mg via ORAL
  Filled 2018-01-14: qty 1

## 2018-01-14 MED ORDER — SENNOSIDES-DOCUSATE SODIUM 8.6-50 MG PO TABS: 1.0000 | ORAL_TABLET | Freq: Every evening | ORAL | Status: DC | PRN

## 2018-01-14 MED ORDER — SODIUM CHLORIDE 0.9% FLUSH: 3.0000 mL | Freq: Two times a day (BID) | INTRAVENOUS | Status: DC

## 2018-01-14 MED ORDER — SODIUM CHLORIDE 0.9% FLUSH
3.0000 mL | Freq: Two times a day (BID) | INTRAVENOUS | Status: DC
  Administered 2018-01-15 (×2): 3 mL via INTRAVENOUS

## 2018-01-14 MED ORDER — PANTOPRAZOLE SODIUM 40 MG PO TBEC
40.0000 mg | DELAYED_RELEASE_TABLET | Freq: Every day | ORAL | Status: DC
  Administered 2018-01-15 (×2): 40 mg via ORAL
  Filled 2018-01-14 (×2): qty 1

## 2018-01-14 MED ORDER — TRAZODONE HCL 50 MG PO TABS
50.0000 mg | ORAL_TABLET | Freq: Every evening | ORAL | Status: DC | PRN
  Administered 2018-01-15: 50 mg via ORAL
  Filled 2018-01-14: qty 1

## 2018-01-14 MED ORDER — SODIUM CHLORIDE 0.9% FLUSH: 3.0000 mL | INTRAVENOUS | Status: DC | PRN

## 2018-01-14 MED ORDER — HYDROCODONE-ACETAMINOPHEN 5-325 MG PO TABS: 1.0000 | ORAL_TABLET | ORAL | Status: DC | PRN

## 2018-01-14 MED ORDER — ACETAMINOPHEN 650 MG RE SUPP: 650.0000 mg | Freq: Four times a day (QID) | RECTAL | Status: DC | PRN

## 2018-01-14 MED ORDER — SODIUM CHLORIDE 0.9 % IV SOLN: 250.0000 mL | INTRAVENOUS | Status: DC | PRN

## 2018-01-14 MED ORDER — ONDANSETRON HCL 4 MG/2ML IJ SOLN: 4.0000 mg | Freq: Four times a day (QID) | INTRAMUSCULAR | Status: DC | PRN

## 2018-01-14 MED ORDER — METOPROLOL SUCCINATE ER 25 MG PO TB24
25.0000 mg | ORAL_TABLET | Freq: Every day | ORAL | Status: DC
  Administered 2018-01-15: 25 mg via ORAL
  Filled 2018-01-14: qty 1

## 2018-01-14 MED ORDER — ASPIRIN EC 81 MG PO TBEC
81.0000 mg | DELAYED_RELEASE_TABLET | Freq: Every day | ORAL | Status: DC
  Administered 2018-01-15: 81 mg via ORAL
  Filled 2018-01-14: qty 1

## 2018-01-14 MED ORDER — ONDANSETRON HCL 4 MG PO TABS: 4.0000 mg | ORAL_TABLET | Freq: Four times a day (QID) | ORAL | Status: DC | PRN

## 2018-01-14 NOTE — ED Notes (Signed)
Sandwich, milk, and regular coke at pt bedside.

## 2018-01-14 NOTE — ED Provider Notes (Signed)
Patient placed in Quick Look pathway, seen and evaluated   Chief Complaint: leg swelling, left arm pain and patient feels speech restricted  HPI:   BUCK MCAFFEE is a 84 y.o. male who presents to the ED with bilateral leg swelling, left arm pain and feeling like his speech is restricted. Patient evaluated by PA in his PCP office and patient has been wearing heart monitor and was sent to [facility] and had multiple tests done and was told he did not have a stroke and they did not find any other reason for his symptoms. Patient's son reports that he had a change in medication and has been very irritable. Was scheduled for Echo today at [facility] but came here instead.  ROS: M/S: bilateral leg pain and swelling  Physical Exam:  BP 140/90 (BP Location: Right Arm)   Pulse 86   Temp 97.8 F (36.6 C) (Oral)   Resp 16   Ht 5\' 11"  (1.803 m)   Wt 95.3 kg (210 lb)   SpO2 97%   BMI 29.29 kg/m    Gen: No distress  Neuro: Awake and Alert  Skin: Warm and dry      Initiation of care has begun. The patient has been counseled on the process, plan, and necessity for staying for the completion/evaluation, and the remainder of the medical screening examination    Ashley Murrain, NP 01/14/18 [place], Julie, MD 01/14/18 [phone]

## 2018-01-14 NOTE — H&P (Signed)
History and Physical    Benjamin Brewer XNA:[id number] DOB: 1931-02-15 DOA: 01/14/2018  PCP: Benjamin Late, MD   Patient coming from: Home   Chief Complaint: Generalized weakness, malaise, left shoulder pain with intermittent arm numbness, palpitations, leg swelling   HPI: Benjamin Brewer is a 84 y.o. male with medical history significant for paroxysmal atrial fibrillation on Eliquis, coronary artery disease, chronic diastolic CHF, hypertension, and chronic kidney disease stage III, now presenting to the emergency department with 1 week of generalized weakness, palpitations, left shoulder pain with intermittent left arm paresthesias, and bilateral lower extremity swelling.  The symptoms developed approximately a week ago and have been persistent.  He was evaluated in the ED on 01/12/2018 with MRI brain that was negative for any acute finding.  He followed up with his cardiologist and wore a Holter monitor for 2 days, not yet reviewed.  TSH was normal earlier this week.  He was started on torsemide by cardiology on 01/11/2018 and reports improvement in his leg swelling, nearly resolved now.  He was scheduled for outpatient echocardiogram to be performed today.  Patient denies headache, chest pain, fevers, chills, or shortness of breath.  No focal weakness.  Patient's son notes that Benjamin Brewer has seemed to be under a lot of stress and grieving since the death of his wife a few months ago and wonders whether this could be contributing.  ED Course: Upon arrival to the ED, patient is found to be afebrile, saturating adequately on room air, and with vitals otherwise normal.  EKG features normal sinus rhythm, chest x-ray is negative for acute cardiopulmonary disease, chemistry panel is notable for a creatinine 1.66, up from 1.25 two days ago.  CBC is unremarkable, BNP is normal, and troponin is undetectable x2.  Patient will be observed in the hospital for ongoing evaluation and management of generalized  weakness.  Review of Systems:  All other systems reviewed and apart from HPI, are negative.  Past Medical History:  Diagnosis Date  . Benign prostatic hypertrophy    Hx of   . CAD (coronary artery disease)    native vessel. Status post LAD and RCA angioplasty and drug-eluting stent placement in 2009  . Dyslipidemia   . History of tachycardia   . Hyperlipidemia   . Hypertension   . Intermediate coronary syndrome (HCC)    angina, unstable  . Other dyspnea and respiratory abnormality    on exertion    Past Surgical History:  Procedure Laterality Date  . [place]; x2  . [place]  . ESOPHAGOGASTRODUODENOSCOPY (EGD) WITH PROPOFOL N/A 06/10/2015   Procedure: ESOPHAGOGASTRODUODENOSCOPY (EGD) WITH PROPOFOL;  Surgeon: Manya Silvas, MD;  Location: [facility] ENDOSCOPY;  Service: Endoscopy;  Laterality: N/A;  . HERNIA REPAIR    . SAVORY DILATION N/A 06/10/2015   Procedure: SAVORY DILATION;  Surgeon: Manya Silvas, MD;  Location: [facility] ENDOSCOPY;  Service: Endoscopy;  Laterality: N/A;  . TONSILLECTOMY       reports that he has never smoked. He has never used smokeless tobacco. He reports that he drinks alcohol. He reports that he does not use drugs.  Allergies  Allergen Reactions  . Terazosin Other (See Comments)    Other reaction(s): Muscle Pain    Family History  Problem Relation Age of Onset  . Stroke Mother   . Heart attack Mother   . Hypertension  Mother   . Stroke Unknown        family history     Prior to Admission medications   Medication Sig Start Date End Date Taking? Authorizing Provider  aspirin EC 81 MG tablet Take 1 tablet (81 mg total) by mouth daily. 01/12/18 02/11/18 Yes Carrie Mew, MD  atorvastatin (LIPITOR) 40 MG tablet TAKE 1 TABLET BY MOUTH ONCE DAILY Patient taking differently: TAKE 40MG  BY MOUTH EVERY EVENING 12/24/17  Yes Wellington Hampshire, MD    cyanocobalamin 500 MCG tablet Take 500 mcg by mouth daily.   Yes [provider]  ELIQUIS 5 MG TABS tablet TAKE 1 TABLET BY MOUTH TWICE DAILY Patient taking differently: TAKE 5MG  BY MOUTH TWICE DAILY 01/03/18  Yes Wellington Hampshire, MD  ferrous sulfate (FERRO-BOB) 325 (65 FE) MG tablet Take 325 mg by mouth daily.     Yes [provider]  metoprolol succinate (TOPROL-XL) 50 MG 24 hr tablet Take 1/2 tablet (25 mg) by mouth once daily. Take with or immediately following a meal. Patient taking differently: Take 25 mg by mouth every evening.  01/11/18  Yes Dunn, Areta Haber, PA-C  Multiple Vitamins-Minerals (EYE VITAMINS PO) Take by mouth 2 (two) times daily.    Yes [provider]  nitroGLYCERIN (NITROSTAT) 0.4 MG SL tablet place 1 tablet under the tongue if needed every 5 minutes fo Patient taking differently: place 1 tablet (0.4mg ) under the tongue every 5 minutes as needed for chest pain 03/29/17  Yes Wellington Hampshire, MD  pantoprazole (PROTONIX) 40 MG tablet TAKE 1 TABLET BY MOUTH ONCE DAILY Patient taking differently: TAKE 40 MG BY MOUTH EVERY EVENING 12/02/17  Yes Wellington Hampshire, MD  torsemide (DEMADEX) 20 MG tablet Take 1 tablet (20 mg total) by mouth daily. Patient taking differently: Take 20 mg by mouth every evening.  01/11/18  Yes Dunn, Areta Haber, PA-C  traZODone (DESYREL) 50 MG tablet Take 1 tablet by mouth at bedtime. 11/12/17  Yes [provider]  fluticasone (FLONASE) 50 MCG/ACT nasal spray Place 1-2 sprays into both nostrils daily. Patient not taking: Reported on 01/14/2018 10/23/13   Meriel Pica, PA-C    Physical Exam: Vitals:   01/14/18 1700 01/14/18 1800 01/14/18 1900 01/14/18 1930  BP: (!) 150/75 (!) 141/71 127/82 126/80  Pulse: 63 64 62 66  Resp: 13 14 19 18   Temp:      TempSrc:      SpO2: 97% 95% 93% 96%  Weight:      Height:          Constitutional: NAD, calm  Eyes: PERTLA, lids and conjunctivae normal ENMT: Mucous membranes are moist.  Posterior pharynx clear of any exudate or lesions.   Neck: normal, supple, no masses, no thyromegaly Respiratory: clear to auscultation bilaterally, no wheezing, no crackles. Normal respiratory effort.   Cardiovascular: S1 & S2 heard, regular rate and rhythm. Trace pretibial edema bilaterally. No significant JVD. Abdomen: No distension, no tenderness, soft. Bowel sounds normal.  Musculoskeletal: no clubbing / cyanosis. No joint deformity upper and lower extremities.    Skin: no significant rashes, lesions, ulcers. Warm, dry, well-perfused. Neurologic: CN 2-12 grossly intact. Sensation to light touch slightly diminished in left hand, patellar DTR normal. Strength 5/5 in all 4 limbs.  Psychiatric: Alert and oriented x 3. Calm, cooperative.     Labs on Admission: I have personally reviewed following labs and imaging studies  CBC: Recent Labs  Lab 01/11/18 1550 01/12/18 1225 01/14/18 1650  WBC 6.4 8.6 8.4  NEUTROABS 4.4 6.6*  --   HGB 13.2 14.4 13.7  HCT 37.9 42.2 41.3  MCV 88 90.1 92.0  PLT 254 272 465   Basic Metabolic Panel: Recent Labs  Lab 01/11/18 1550 01/12/18 1225 01/14/18 1650  NA 143 139 139  K 4.2 3.8 3.9  CL 107* 104 102  CO2 21 26 27   GLUCOSE 93 108* 94  BUN 19 23 22   CREATININE 1.21 1.25* 1.66*  CALCIUM 9.4 9.9 9.4   GFR: Estimated Creatinine Clearance: 38.3 mL/min (A) (by C-G formula based on SCr of 1.66 mg/dL (H)). Liver Function Tests: Recent Labs  Lab 01/12/18 1225  AST 24  ALT 20  ALKPHOS 97  BILITOT 1.1  PROT 7.6  ALBUMIN 4.8   No results for input(s): LIPASE, AMYLASE in the last 168 hours. No results for input(s): AMMONIA in the last 168 hours. Coagulation Profile: Recent Labs  Lab 01/12/18 1225  INR 1.11   Cardiac Enzymes: Recent Labs  Lab 01/12/18 1225  TROPONINI <0.03   BNP (last 3 results) No results for input(s): PROBNP in the last 8760 hours. HbA1C: No results for input(s): HGBA1C in the last 72 hours. CBG: Recent Labs    Lab 01/12/18 1222  GLUCAP 104*   Lipid Profile: No results for input(s): CHOL, HDL, LDLCALC, TRIG, CHOLHDL, LDLDIRECT in the last 72 hours. Thyroid Function Tests: No results for input(s): TSH, T4TOTAL, FREET4, T3FREE, THYROIDAB in the last 72 hours. Anemia Panel: No results for input(s): VITAMINB12, FOLATE, FERRITIN, TIBC, IRON, RETICCTPCT in the last 72 hours. Urine analysis: No results found for: COLORURINE, APPEARANCEUR, LABSPEC, PHURINE, GLUCOSEU, HGBUR, BILIRUBINUR, KETONESUR, PROTEINUR, UROBILINOGEN, NITRITE, LEUKOCYTESUR Sepsis Labs: @LABRCNTIP (procalcitonin:4,lacticidven:4) )No results found for this or any previous visit (from the past 240 hour(s)).   Radiological Exams on Admission: Dg Chest 2 View  Result Date: 01/14/2018 CLINICAL DATA:  Fatigue and nausea.  Swelling.  Evaluation for CHF. EXAM: CHEST - 2 VIEW COMPARISON:  02/18/2017 FINDINGS: The cardiomediastinal silhouette is unchanged with normal heart size and tortuosity of the descending thoracic aorta again noted. The lungs are hyperinflated. No airspace consolidation, edema, pleural effusion, or pneumothorax is identified. A chronic lower thoracic compression fracture is again noted. IMPRESSION: No active cardiopulmonary disease. Electronically Signed   By: Logan Bores M.D.   On: 01/14/2018 17:15    EKG: Independently reviewed. Normal sinus rhythm.   Assessment/Plan   1. Generalized weakness  - Presents with ~1 week of generalized weakness, palpitations, bilateral leg swelling, and left shoulder pain with arm numbness - He had MRI brain on 7/3 that was negative for acute abnormality and TSH was normal on 7/3  - Check CK, echocardiogram, PT consult   2. Acute kidney injury superimposed on CKD III  - SCr is 1.66 on admission, up from apparent baseline of ~1.25  - Likely prerenal and secondary to recent diuresis  - Hold diuretic, check urine chemistries, repeat chem panel in am    3. Atrial fibrillation,  paroxysmal  - In a sinus rhythm on admission  - Reports recent palpitations, was wearing a Holter monitor the last couple days, not yet evaluated  - CHADS-VASc 5 (age x2, CAD, HTN, CHF)  - Continue cardiac monitoring, continue Eliquis and metoprolol   4. Hypertension  - BP at goal  - Continue Toprol    5. CAD  - Denies chest pain, but reports gen weakness, palpitations, and left shoulder pain with arm numbness x1 wk that is worse with  movement  - Troponin undetectable x2 in ED, will check a third, continue cardiac monitoring, ASA, statin, beta-blocker, and anticoagulant   6. Left arm numbness  - Reports ~1 wk of left shoulder pain with left arm numbness  - He was evaluated in ED on 7/3 with MRI brain negative for acute findings  - ACS was considered, but there is no chest pain and troponin is undetectable x2 in ED  - Check shoulder radiographs and consult with PT    DVT prophylaxis: Eliquis  Code Status: Full  Family Communication: Son updated at bedside Consults called: None Admission status: Observation     Vianne Bulls, MD [facility] Pager [phone]  If 7PM-7AM, please contact night-coverage [email or web address] Password [email or web address]  01/14/2018, 10:26 PM

## 2018-01-14 NOTE — ED Triage Notes (Signed)
Pt was seen Wednesday at [facility] for blurred vision, arm numbness, and swelling. Pt reports that he was given a heart monitor to wear and has it with him today. Pt was scheduled to have echo preformed today.

## 2018-01-14 NOTE — ED Provider Notes (Signed)
[place] EMERGENCY DEPARTMENT Provider Note   CSN: [id number] Arrival date & time: 01/14/18  1417  History   Chief Complaint Chief Complaint  Patient presents with  . Leg Swelling  . Arm Pain    HPI Benjamin Brewer is a 84 y.o. male.  HPI Patient is a 84 year old male with history of CAD status post PCI, atrial fibrillation on Eliquis, and HTN presenting to the ED for fatigue, leg swelling, palpitations, and generalized weakness. He was seen at outside E2 days ago for blurred vision, generalized weakness, and paresthesias. He underwent MRI at that time which showed no acute stroke. Troponin was negative. He was discharged home. Since then, he has had intermittent sensation of heart "skipping a beat" with increased leg swelling. He states he was recently started on a water pill with improvement in his leg swelling. He is currently on an outpatient cardiac monitor. No chest pain, dyspnea, or exertional symptoms. No syncope. Today, he is scheduled for an echo but felt that he needed to be evaluated due to generalized weakness and is intermittent palpitations. No current symptoms at rest. Son also privately told me that patient has been more worried about his general health since the loss of his wife about 2 months ago.  Past Medical History:  Diagnosis Date  . Benign prostatic hypertrophy    Hx of   . CAD (coronary artery disease)    native vessel. Status post LAD and RCA angioplasty and drug-eluting stent placement in 2009  . Dyslipidemia   . History of tachycardia   . Hyperlipidemia   . Hypertension   . Intermediate coronary syndrome (HCC)    angina, unstable  . Other dyspnea and respiratory abnormality    on exertion    Patient Active Problem List   Diagnosis Date Noted  . Atrial fibrillation ([place]) 08/20/2014  . BPPV (benign paroxysmal positional vertigo) 10/23/2013  . Sinus bradycardia 06/16/2013  . Essential hypertension 06/16/2013  . CAD, NATIVE  VESSEL 07/03/2009  . DYSPNEA ON EXERTION 12/21/2008  . Hyperlipidemia 12/15/2008  . BENIGN PROSTATIC HYPERTROPHY, HX OF 12/15/2008    Past Surgical History:  Procedure Laterality Date  . [place]; x2  . [place]  . ESOPHAGOGASTRODUODENOSCOPY (EGD) WITH PROPOFOL N/A 06/10/2015   Procedure: ESOPHAGOGASTRODUODENOSCOPY (EGD) WITH PROPOFOL;  Surgeon: Manya Silvas, MD;  Location: [facility] ENDOSCOPY;  Service: Endoscopy;  Laterality: N/A;  . HERNIA REPAIR    . SAVORY DILATION N/A 06/10/2015   Procedure: SAVORY DILATION;  Surgeon: Manya Silvas, MD;  Location: [facility] ENDOSCOPY;  Service: Endoscopy;  Laterality: N/A;  . TONSILLECTOMY          Home Medications    Prior to Admission medications   Medication Sig Start Date End Date Taking? Authorizing Provider  aspirin EC 81 MG tablet Take 1 tablet (81 mg total) by mouth daily. 01/12/18 02/11/18  Carrie Mew, MD  atorvastatin (LIPITOR) 40 MG tablet TAKE 1 TABLET BY MOUTH ONCE DAILY 12/24/17   Wellington Hampshire, MD  cyanocobalamin 500 MCG tablet Take 500 mcg by mouth daily.    [provider]  ELIQUIS 5 MG TABS tablet TAKE 1 TABLET BY MOUTH TWICE DAILY 01/03/18   Wellington Hampshire, MD  ferrous sulfate (FERRO-BOB) 325 (65 FE) MG tablet Take 325 mg by mouth daily.      [provider]  fluticasone (  FLONASE) 50 MCG/ACT nasal spray Place 1-2 sprays into both nostrils daily. Patient taking differently: Place 1-2 sprays into both nostrils as needed.  10/23/13   Arguello, Roger A, PA-C  metoprolol succinate (TOPROL-XL) 50 MG 24 hr tablet Take 1/2 tablet (25 mg) by mouth once daily. Take with or immediately following a meal. 01/11/18   Dunn, Areta Haber, PA-C  Multiple Vitamins-Minerals (EYE VITAMINS PO) Take by mouth 2 (two) times daily.     [provider]  nitroGLYCERIN (NITROSTAT) 0.4 MG SL tablet place 1 tablet under the tongue  if needed every 5 minutes fo 03/29/17   Wellington Hampshire, MD  pantoprazole (PROTONIX) 40 MG tablet TAKE 1 TABLET BY MOUTH ONCE DAILY 12/02/17   Wellington Hampshire, MD  torsemide (DEMADEX) 20 MG tablet Take 1 tablet (20 mg total) by mouth daily. 01/11/18   Dunn, Areta Haber, PA-C  traZODone (DESYREL) 50 MG tablet Take 1 tablet by mouth at bedtime. 11/12/17   [provider]    Family History Family History  Problem Relation Age of Onset  . Stroke Mother   . Heart attack Mother   . Hypertension Mother   . Stroke Unknown        family history    Social History Social History   Tobacco Use  . Smoking status: Never Smoker  . Smokeless tobacco: Never Used  Substance Use Topics  . Alcohol use: Yes    Comment: occasional   . Drug use: No     Allergies   Terazosin   Review of Systems Review of Systems  Constitutional: Negative for fever.  HENT: Negative for congestion.   Eyes: Positive for visual disturbance (resolved).  Respiratory: Negative for cough and shortness of breath.   Cardiovascular: Positive for palpitations and leg swelling. Negative for chest pain.  Gastrointestinal: Positive for nausea. Negative for abdominal pain, diarrhea and vomiting.  Genitourinary: Negative for dysuria.  Musculoskeletal: Negative for back pain.  Skin: Negative for rash.  Neurological: Negative for syncope.  All other systems reviewed and are negative.    Physical Exam Updated Vital Signs BP 140/90 (BP Location: Right Arm)   Pulse 86   Temp 97.8 F (36.6 C) (Oral)   Resp 16   Ht 5\' 11"  (1.803 m)   Wt 95.3 kg (210 lb)   SpO2 97%   BMI 29.29 kg/m   Physical Exam  Constitutional: He is oriented to person, place, and time. No distress.  HENT:  Head: Normocephalic and atraumatic.  Mouth/Throat: Oropharynx is clear and moist.  Eyes: Pupils are equal, round, and reactive to light.  Neck: Neck supple. No JVD present.  Cardiovascular: Normal rate, regular rhythm, normal heart sounds  and intact distal pulses.  No murmur heard. Pulmonary/Chest: No respiratory distress. He has no wheezes. He has rales (minimal, bibasilar).  Abdominal: Soft. He exhibits no distension and no mass. There is no tenderness. There is no guarding.  Musculoskeletal: Normal range of motion. He exhibits no edema.  Neurological: He is alert and oriented to person, place, and time.  Speech is fluent. Cranial nerves II-XII intact. 5/5 strength in all extremities with intact sensation to light touch throughout. No dysmetria on finger-to-nose testing. Negative Romberg. Normal gait.  Skin: Skin is warm and dry.  Psychiatric: He has a normal mood and affect. His behavior is normal.  Nursing note and vitals reviewed.    ED Treatments / Results  Labs (all labs ordered are listed, but only abnormal results are displayed) Labs  Reviewed - No data to display  EKG EKG 1216: NSR rate of 63. Normal axis. Borderline QRS. Poor R-wave progression. Possible Q waves in aVF. New TWI in leads III and V3. Personally reviewed and interpreted.  Procedures Procedures (including critical care time)  Medications Ordered in ED Medications - No data to display   Initial Impression / Assessment and Plan / ED Course  I have reviewed the triage vital signs and the nursing notes.  Pertinent labs & imaging results that were available during my care of the patient were reviewed by me and considered in my medical decision making (see chart for details).  Initial differential includes symptomatic palpitations, ACS, CHF, and grief reaction. EKG with non-specific new T wave changes. Serial troponin negative. Screening labs otherwise reassuring. No signs of DVT, doubt PE. He is clinically euvolemic and BNP is normal, doubt significant CHF. Shared decision making utilized with patient. He would benefit from further observation and potentially provocative testing for further ACS r/o. Patient admitted to hospitalist for further  management.   Final Clinical Impressions(s) / ED Diagnoses   Final diagnoses:  Left shoulder pain      Prescilla Sours, MD 01/15/18 1252    Elnora Morrison, MD 01/15/18 [phone]

## 2018-01-15 ENCOUNTER — Observation Stay (HOSPITAL_BASED_OUTPATIENT_CLINIC_OR_DEPARTMENT_OTHER): Payer: PPO

## 2018-01-15 ENCOUNTER — Encounter (HOSPITAL_COMMUNITY): Payer: Self-pay | Admitting: *Deleted

## 2018-01-15 DIAGNOSIS — I5030 Unspecified diastolic (congestive) heart failure: Secondary | ICD-10-CM | POA: Diagnosis not present

## 2018-01-15 DIAGNOSIS — N183 Chronic kidney disease, stage 3 (moderate): Secondary | ICD-10-CM | POA: Diagnosis not present

## 2018-01-15 DIAGNOSIS — R531 Weakness: Secondary | ICD-10-CM | POA: Diagnosis not present

## 2018-01-15 DIAGNOSIS — N179 Acute kidney failure, unspecified: Secondary | ICD-10-CM

## 2018-01-15 LAB — BASIC METABOLIC PANEL
Anion gap: 9 (ref 5–15)
BUN: 24 mg/dL — AB (ref 8–23)
CALCIUM: 9.1 mg/dL (ref 8.9–10.3)
CHLORIDE: 102 mmol/L (ref 98–111)
CO2: 26 mmol/L (ref 22–32)
CREATININE: 1.47 mg/dL — AB (ref 0.61–1.24)
GFR calc non Af Amer: 42 mL/min — ABNORMAL LOW (ref >60)
GFR, EST AFRICAN AMERICAN: 48 mL/min — AB (ref >60)
Glucose, Bld: 119 mg/dL — ABNORMAL HIGH (ref 70–99)
Potassium: 3.7 mmol/L (ref 3.5–5.1)
SODIUM: 137 mmol/L (ref 135–145)

## 2018-01-15 LAB — SODIUM, URINE, RANDOM: SODIUM UR: 153 mmol/L

## 2018-01-15 LAB — ECHOCARDIOGRAM COMPLETE
Height: 71 in
WEIGHTICAEL: 3163.2 [oz_av]

## 2018-01-15 LAB — CK: CK TOTAL: 78 U/L (ref 49–397)

## 2018-01-15 LAB — CREATININE, URINE, RANDOM: Creatinine, Urine: 212.98 mg/dL

## 2018-01-15 LAB — TROPONIN I

## 2018-01-15 MED ORDER — FUROSEMIDE 20 MG PO TABS: 20.0000 mg | ORAL_TABLET | Freq: Every day | ORAL | 0 refills | Status: DC | PRN

## 2018-01-15 MED ORDER — BUSPIRONE HCL 7.5 MG PO TABS: 7.5000 mg | ORAL_TABLET | Freq: Two times a day (BID) | ORAL | 0 refills | Status: DC

## 2018-01-15 MED ORDER — METOPROLOL SUCCINATE ER 25 MG PO TB24: 12.5000 mg | ORAL_TABLET | Freq: Every day | ORAL | 0 refills | Status: DC

## 2018-01-15 MED ORDER — FAMOTIDINE 20 MG PO TABS: 20.0000 mg | ORAL_TABLET | Freq: Two times a day (BID) | ORAL | Status: DC | PRN

## 2018-01-15 NOTE — Evaluation (Signed)
Physical Therapy Evaluation and Discharge Patient Details Name: Benjamin Brewer MRN: [id number] DOB: 1930-10-23 Today's Date: 01/15/2018   History of Present Illness  Benjamin Brewer is a 84 y.o. male with medical history significant for paroxysmal atrial fibrillation on Eliquis, coronary artery disease, chronic diastolic CHF, hypertension, and chronic kidney disease stage III, now presenting to the emergency department with 1 week of generalized weakness, palpitations, left shoulder pain with intermittent left arm paresthesias, and bilateral lower extremity swelling. MRI of brain negative. Scheduled for echo. Pt son reports pt to be under alot of stress and grieving since dealth of his wife back in march.  Clinical Impression  Pt admitted with above. Pt functioning a baseline and indep/mod I. Pt denies dizziness with mobility. Pt only with point tenderness over bicep tendon insertion at anterior shoulder. May benefit from outpatient PT for modalities and strengthening/stretching. Pt with no further acute PT needs at this time. ACUTE PT SIGNING OFF. Please re-consult if needed in future.    Follow Up Recommendations No PT follow up (would benefit from outpt PT for L shoulder pain management with modalities and strengthening and stretching.)    Equipment Recommendations  None recommended by PT    Recommendations for Other Services       Precautions / Restrictions Precautions Precautions: None Restrictions Weight Bearing Restrictions: No      Mobility  Bed Mobility Overal bed mobility: Independent             General bed mobility comments: no difficulty, HOB flat, no physical assist  Transfers Overall transfer level: Modified independent Equipment used: None             General transfer comment: pushed up from bed, no instability, no dizziness, no physical assist needed  Ambulation/Gait Ambulation/Gait assistance: Supervision Gait Distance (Feet): 350 Feet Assistive  device: None Gait Pattern/deviations: WFL(Within Functional Limits) Gait velocity: wfl Gait velocity interpretation: >4.37 ft/sec, indicative of normal walking speed General Gait Details: normal gait pattern at normal cadence. Pt with no episodes of LOB ro report of dizziness or UE pain.  Stairs Stairs: Yes Stairs assistance: Supervision Stair Management: One rail Right;Alternating pattern;Forwards Number of Stairs: 8    Wheelchair Mobility    Modified Rankin (Stroke Patients Only)       Balance                                 Standardized Balance Assessment Standardized Balance Assessment : Dynamic Gait Index   Dynamic Gait Index Level Surface: Normal Change in Gait Speed: Normal Gait with Horizontal Head Turns: Normal Gait with Vertical Head Turns: Normal Gait and Pivot Turn: Normal Step Over Obstacle: Normal Step Around Obstacles: Normal Steps: Mild Impairment Total Score: 23       Pertinent Vitals/Pain Pain Assessment: No/denies pain(except with palpation over L bicep tendon insertion)    Home Living Family/patient expects to be discharged to:: Private residence Living Arrangements: Alone               Additional Comments: son lives down the rode    Prior Function Level of Independence: Independent         Comments: 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, 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     Hand Dominance   Dominant Hand: Right    Extremity/Trunk Assessment   Upper Extremity Assessment Upper Extremity Assessment: Overall WFL for tasks assessed    Lower Extremity Assessment Lower  Extremity Assessment: Overall WFL for tasks assessed    Cervical / Trunk Assessment Cervical / Trunk Assessment: Kyphotic(mild forward head)  Communication   Communication: No difficulties  Cognition Arousal/Alertness: Awake/alert Behavior During Therapy: WFL for tasks assessed/performed Overall Cognitive Status: Within Functional Limits for tasks assessed                                  General Comments: flat affect, reports still grieving after loss of wife in march. they were married for 20 years      General Comments General comments (skin integrity, edema, etc.): no significant edema noted in bilat UE or LEs    Exercises     Assessment/Plan    PT Assessment Patent does not need any further PT services  PT Problem List         PT Treatment Interventions      PT Goals (Current goals can be found in the Care Plan section)  Acute Rehab PT Goals Patient Stated Goal: home PT Goal Formulation: All assessment and education complete, DC therapy    Frequency     Barriers to discharge        Co-evaluation               AM-PAC PT "6 Clicks" Daily Activity  Outcome Measure Difficulty turning over in bed (including adjusting bedclothes, sheets and blankets)?: None Difficulty moving from lying on back to sitting on the side of the bed? : None Difficulty sitting down on and standing up from a chair with arms (e.g., wheelchair, bedside commode, etc,.)?: None Help needed moving to and from a bed to chair (including a wheelchair)?: None Help needed walking in hospital room?: None Help needed climbing 3-5 steps with a railing? : A Little 6 Click Score: 23    End of Session   Activity Tolerance: Patient tolerated treatment well Patient left: in chair;with call bell/phone within reach Nurse Communication: Mobility status PT Visit Diagnosis: Muscle weakness (generalized) (M62.81)    Time: 8841-6606 PT Time Calculation (min) (ACUTE ONLY): 18 min   Charges:   PT Evaluation $PT Eval Low Complexity: 1 Low     PT G CodesKittie Plater, PT, DPT Pager #: [phone] Office #: [phone]   [place] 01/15/2018, 8:59 AM

## 2018-01-15 NOTE — Plan of Care (Signed)
  Problem: Activity: Goal: Risk for activity intolerance will decrease Outcome: Completed/Met   Problem: Nutrition: Goal: Adequate nutrition will be maintained Outcome: Completed/Met   Problem: Coping: Goal: Level of anxiety will decrease Outcome: Completed/Met   Problem: Elimination: Goal: Will not experience complications related to bowel motility Outcome: Completed/Met Goal: Will not experience complications related to urinary retention Outcome: Completed/Met   Problem: Safety: Goal: Ability to remain free from injury will improve Outcome: Completed/Met   Problem: Skin Integrity: Goal: Risk for impaired skin integrity will decrease Outcome: Completed/Met   

## 2018-01-15 NOTE — Progress Notes (Signed)
*  PRELIMINARY RESULTS* Echocardiogram 2D Echocardiogram has been performed.  Benjamin Brewer 01/15/2018, 12:20 PM

## 2018-01-15 NOTE — Discharge Summary (Signed)
Benjamin Brewer, is a 84 y.o. male  DOB [birth year]  MRN [id number].  Admission date:  01/14/2018  Admitting Physician  Vianne Bulls, MD  Discharge Date:  01/15/2018   Primary MD  Derinda Late, MD  Recommendations for primary care physician for things to follow:  -Check CBC, BMP during next visit   Admission Diagnosis  Left shoulder pain [M25.512]   Discharge Diagnosis  Left shoulder pain [M25.512]   Principal Problem:   Generalized weakness Active Problems:   CAD, NATIVE VESSEL   Essential hypertension   Atrial fibrillation (HCC)   Acute renal failure superimposed on stage 3 chronic kidney disease (HCC)   Left arm numbness      Past Medical History:  Diagnosis Date  . Benign prostatic hypertrophy    Hx of   . CAD (coronary artery disease)    native vessel. Status post LAD and RCA angioplasty and drug-eluting stent placement in 2009  . Dyslipidemia   . History of tachycardia   . Hyperlipidemia   . Hypertension   . Intermediate coronary syndrome (HCC)    angina, unstable  . Other dyspnea and respiratory abnormality    on exertion    Past Surgical History:  Procedure Laterality Date  . [place]; x2  . [place]  . ESOPHAGOGASTRODUODENOSCOPY (EGD) WITH PROPOFOL N/A 06/10/2015   Procedure: ESOPHAGOGASTRODUODENOSCOPY (EGD) WITH PROPOFOL;  Surgeon: Manya Silvas, MD;  Location: [facility] ENDOSCOPY;  Service: Endoscopy;  Laterality: N/A;  . HERNIA REPAIR    . SAVORY DILATION N/A 06/10/2015   Procedure: SAVORY DILATION;  Surgeon: Manya Silvas, MD;  Location: [facility] ENDOSCOPY;  Service: Endoscopy;  Laterality: N/A;  . TONSILLECTOMY         History of present illness and  Hospital Course:     Kindly see H&P for history of present illness and admission details, please review complete  Labs, Consult reports and Test reports for all details in brief  HPI  from the history and physical done on the day of admission 01/14/2018  HPI: Benjamin Brewer is a 84 y.o. male with medical history significant for paroxysmal atrial fibrillation on Eliquis, coronary artery disease, chronic diastolic CHF, hypertension, and chronic kidney disease stage III, now presenting to the emergency department with 1 week of generalized weakness, palpitations, left shoulder pain with intermittent left arm paresthesias, and bilateral lower extremity swelling.  The symptoms developed approximately a week ago and have been persistent.  He was evaluated in the ED on 01/12/2018 with MRI brain that was negative for any acute finding.  He followed up with his cardiologist and wore a Holter monitor for 2 days, not yet reviewed.  TSH was normal earlier this week.  He was started on torsemide by cardiology on 01/11/2018 and reports improvement in his leg swelling, nearly resolved now.  He was scheduled for outpatient echocardiogram to be performed today.  Patient denies headache, chest pain, fevers, chills, or shortness of breath.  No focal weakness.  Patient's son notes that Benjamin Brewer has seemed to be under a lot of stress and grieving since the death of his wife a few months ago and wonders whether this could be contributing.  ED Course: Upon arrival to the ED, patient is found to be afebrile, saturating adequately on room air, and with vitals otherwise normal.  EKG features normal sinus rhythm, chest x-ray is negative for acute cardiopulmonary disease, chemistry panel is notable for a creatinine 1.66, up from 1.25 two days ago.  CBC is unremarkable, BNP is normal, and troponin is undetectable x2.  Patient will be observed in the hospital for ongoing evaluation and management of generalized weakness.      Hospital Course     Generalized weakness  - Presents with ~1 week of generalized weakness, palpitations, bilateral  leg swelling, and left shoulder pain with arm numbness - He had MRI brain on 7/3 that was negative for acute abnormality and TSH was normal on 7/3  -This is most likely in the setting of duration volume depletion as he started on torsemide, currently denies any complaints, has seen by PT recommendation for outpatient PT -As well he did have some bradycardia which may be contributing to it  Acute kidney injury superimposed on CKD III  - SCr is 1.66 on admission, up from apparent baseline of ~1.25 , this is most likely prerenal as he was started on diuresis, currently no evidence of volume overload, I have discussed with him, torsemide has been stopped, he will be discharged home prescription for 20 mg of Lasix only on as-needed basis.  Atrial fibrillation, paroxysmal  -in a sinus rhythm on admission  - Reports recent palpitations, was wearing a Holter monitor the last couple days, not yet evaluated  - CHADS-VASc 5 (age x2, CAD, HTN, CHF)  - Continue cardiac monitoring, continue Eliquis  -Patient was noticed to be with low heart rate in the mid 40s overnight, even though his metoprolol XL dose has recently decreased by half, I will go ahead and decrease his further from 25 to 12.5 mg oral daily  Hypertension  - BP at goal  - Continue Toprol    CAD  - Denies chest pain, but reports gen weakness, palpitations, and left shoulder pain with arm numbness x1 wk that is worse with movement  - Troponin undetectable x2 in ED, will check a third, continue cardiac monitoring, ASA, statin, beta-blocker, and anticoagulant   Left arm numbness  - Reports ~1 wk of left shoulder pain with left arm numbness  - He was evaluated in ED on 7/3 with MRI brain negative for acute findings  - ACS was considered, but there is no chest pain and troponin is undetectable x2 in ED, EKG nonacute -Shoulder x-ray with no acute findings,  Chronic diastolic CHF -Currently appears to be euvolemic, I have instructed him  to stop torsemide, repeat echo showing preserved EF at 65%, with grade 2 diastolic CHF, he will be discharged on Lasix as needed  Anxiety/depression/grief reaction -Patient wife passed away 3 months ago, he does report some anxiety, he will be started on low-dose BuSpar       Discharge Condition:  stable   Follow UP  Follow-up Information    Derinda Late, MD Follow up in 1 week(s).   Specialty:  Family Medicine Contact information: [address] [place] [address] [phone]  Discharge Instructions  and  Discharge Medications    Discharge Instructions    Discharge instructions   Complete by:  As directed    Follow with Primary MD Derinda Late, MD in 7 days   Get CBC, CMP,  checked  by Primary MD next visit.    Activity: As tolerated with Full fall precautions use walker/cane & assistance as needed   Disposition Home    Diet: Heart Healthy , with feeding assistance and aspiration precautions.  For Heart failure patients - Check your Weight same time everyday, if you gain over 2 pounds, or you develop in leg swelling, experience more shortness of breath or chest pain, call your Primary MD immediately. Follow Cardiac Low Salt Diet and 1.5 lit/day fluid restriction.   On your next visit with your primary care physician please Get Medicines reviewed and adjusted.   Please request your Prim.MD to go over all Hospital Tests and Procedure/Radiological results at the follow up, please get all Hospital records sent to your Prim MD by signing hospital release before you go home.   If you experience worsening of your admission symptoms, develop shortness of breath, life threatening emergency, suicidal or homicidal thoughts you must seek medical attention immediately by calling 911 or calling your MD immediately  if symptoms less severe.  You Must read complete instructions/literature along with all the possible adverse reactions/side effects  for all the Medicines you take and that have been prescribed to you. Take any new Medicines after you have completely understood and accpet all the possible adverse reactions/side effects.   Do not drive, operating heavy machinery, perform activities at heights, swimming or participation in water activities or provide baby sitting services if your were admitted for syncope or siezures until you have seen by Primary MD or a Neurologist and advised to do so again.  Do not drive when taking Pain medications.    Do not take more than prescribed Pain, Sleep and Anxiety Medications  Special Instructions: If you have smoked or chewed Tobacco  in the last 2 yrs please stop smoking, stop any regular Alcohol  and or any Recreational drug use.  Wear Seat belts while driving.   Please note  You were cared for by a hospitalist during your hospital stay. If you have any questions about your discharge medications or the care you received while you were in the hospital after you are discharged, you can call the unit and asked to speak with the hospitalist on call if the hospitalist that took care of you is not available. Once you are discharged, your primary care physician will handle any further medical issues. Please note that NO REFILLS for any discharge medications will be authorized once you are discharged, as it is imperative that you return to your primary care physician (or establish a relationship with a primary care physician if you do not have one) for your aftercare needs so that they can reassess your need for medications and monitor your lab values.   Increase activity slowly   Complete by:  As directed      Allergies as of 01/15/2018      Reactions   Terazosin Other (See Comments)   Other reaction(s): Muscle Pain      Medication List    STOP taking these medications   torsemide 20 MG tablet Commonly known as:  DEMADEX     TAKE these medications   aspirin EC 81 MG tablet Take 1 tablet  (81 mg total) by  mouth daily.   atorvastatin 40 MG tablet Commonly known as:  LIPITOR TAKE 1 TABLET BY MOUTH ONCE DAILY What changed:    how much to take  how to take this  when to take this   busPIRone 7.5 MG tablet Commonly known as:  BUSPAR Take 1 tablet (7.5 mg total) by mouth 2 (two) times daily.   ELIQUIS 5 MG Tabs tablet Generic drug:  apixaban TAKE 1 TABLET BY MOUTH TWICE DAILY What changed:    how much to take  how to take this  when to take this   EYE VITAMINS PO Take by mouth 2 (two) times daily.   FERRO-BOB 325 (65 FE) MG tablet Generic drug:  ferrous sulfate Take 325 mg by mouth daily.   fluticasone 50 MCG/ACT nasal spray Commonly known as:  FLONASE Place 1-2 sprays into both nostrils daily.   furosemide 20 MG tablet Commonly known as:  LASIX Take 1 tablet (20 mg total) by mouth daily as needed for fluid or edema.   metoprolol succinate 25 MG 24 hr tablet Commonly known as:  TOPROL-XL Take 0.5 tablets (12.5 mg total) by mouth daily. Start taking on:  01/16/2018 What changed:    medication strength  how much to take  how to take this  when to take this  additional instructions   nitroGLYCERIN 0.4 MG SL tablet Commonly known as:  NITROSTAT place 1 tablet under the tongue if needed every 5 minutes fo What changed:  See the new instructions.   pantoprazole 40 MG tablet Commonly known as:  PROTONIX TAKE 1 TABLET BY MOUTH ONCE DAILY What changed:    how much to take  how to take this  when to take this   traZODone 50 MG tablet Commonly known as:  DESYREL Take 1 tablet by mouth at bedtime.   vitamin B-12 500 MCG tablet Commonly known as:  CYANOCOBALAMIN Take 500 mcg by mouth daily.         Diet and Activity recommendation: See Discharge Instructions above   Consults obtained -  none   Major procedures and Radiology Reports - PLEASE review detailed and final reports for all details, in brief -      Dg Chest 2  View  Result Date: 01/14/2018 CLINICAL DATA:  Fatigue and nausea.  Swelling.  Evaluation for CHF. EXAM: CHEST - 2 VIEW COMPARISON:  02/18/2017 FINDINGS: The cardiomediastinal silhouette is unchanged with normal heart size and tortuosity of the descending thoracic aorta again noted. The lungs are hyperinflated. No airspace consolidation, edema, pleural effusion, or pneumothorax is identified. A chronic lower thoracic compression fracture is again noted. IMPRESSION: No active cardiopulmonary disease. Electronically Signed   By: Logan Bores M.D.   On: 01/14/2018 17:15   Ct Head Wo Contrast  Result Date: 01/12/2018 CLINICAL DATA:  Left arm numbness. EXAM: CT HEAD WITHOUT CONTRAST TECHNIQUE: Contiguous axial images were obtained from the base of the skull through the vertex without intravenous contrast. COMPARISON:  CT scan of May 19, 2011. FINDINGS: Brain: No evidence of acute infarction, hemorrhage, hydrocephalus, extra-axial collection or mass lesion/mass effect. Vascular: No hyperdense vessel or unexpected calcification. Skull: Normal. Negative for fracture or focal lesion. Sinuses/Orbits: No acute finding. Other: None. IMPRESSION: Normal head CT. Electronically Signed   By: Marijo Conception, M.D.   On: 01/12/2018 12:52   Mr Brain Wo Contrast  Result Date: 01/12/2018 CLINICAL DATA:  Intermittent tingling in the LEFT arm and leg for 2 days. Stroke risk factors include  hypertension and hyperlipidemia. Blurred vision. Patient is on Eliquis. Normal neurologic exam. EXAM: MRI HEAD WITHOUT CONTRAST TECHNIQUE: Multiplanar, multiecho pulse sequences of the brain and surrounding structures were obtained without intravenous contrast. COMPARISON:  CT head 01/12/2018. CT head 05/19/2011. MR head 05/20/2011. FINDINGS: Brain: No evidence for acute infarction, hemorrhage, mass lesion, hydrocephalus, or extra-axial fluid. Normal for age cerebral volume. Mild subcortical and periventricular T2 and FLAIR hyperintensities,  likely chronic microvascular ischemic change. Vascular: Flow voids are maintained. Tiny focus chronic hemorrhage LEFT posterior frontal subcortical white matter, likely hypertensive related. Skull and upper cervical spine: Normal marrow signal. Cervical spondylosis, possible mild stenosis C3-C4. No tonsillar herniation. Mild pannus. Small hemangioma, RIGHT parietal bone, unchanged from 2012. Sinuses/Orbits: No sinus or mastoid disease. RIGHT cataract extraction. Otherwise negative orbits. Other: Stable appearance the brain since 2012. IMPRESSION: No acute infarct.  No intracranial mass lesion is evident. Mild atrophy and small vessel disease without significant progression from priors. Electronically Signed   By: Staci Righter M.D.   On: 01/12/2018 17:14   Dg Shoulder Left Port  Result Date: 01/14/2018 CLINICAL DATA:  Left shoulder pain intermittently for 2 weeks EXAM: LEFT SHOULDER - 1 VIEW COMPARISON:  None. FINDINGS: AC joint is intact. Left lung apex is clear. No fracture or malalignment. IMPRESSION: Negative. Electronically Signed   By: Donavan Foil M.D.   On: 01/14/2018 22:51    Micro Results    No results found for this or any previous visit (from the past 240 hour(s)).     Today   Subjective:   Wille Glaser today has no headache,no chest or  abdominal pain,no new weakness tingling or numbness, feels much better wants to go home today.   Objective:   Blood pressure 139/74, pulse (!) 55, temperature 97.6 F (36.4 C), temperature source Oral, resp. rate 18, height 5\' 11"  (1.803 m), weight 89.7 kg (197 lb 11.2 oz), SpO2 99 %.   Intake/Output Summary (Last 24 hours) at 01/15/2018 1422 Last data filed at 01/15/2018 1300 Gross per 24 hour  Intake 600 ml  Output 901 ml  Net -301 ml    Exam Awake Alert, Oriented x 3, No new F.N deficits, Normal affectd.  Symmetrical Chest wall movement, Good air movement bilaterally, CTAB RRR,No Gallops,Rubs or new Murmurs, No Parasternal  Heave +ve B.Sounds, Abd Soft, Non tender, No rebound -guarding or rigidity. No Cyanosis, Clubbing or edema, No new Rash or bruise  Data Review   CBC w Diff:  Lab Results  Component Value Date   WBC 8.4 01/14/2018   HGB 13.7 01/14/2018   HGB 13.2 01/11/2018   HCT 41.3 01/14/2018   HCT 37.9 01/11/2018   PLT 218 01/14/2018   PLT 254 01/11/2018   LYMPHOPCT 11 01/12/2018   LYMPHOPCT 8.8 10/08/2012   MONOPCT 9 01/12/2018   MONOPCT 10.1 10/08/2012   EOSPCT 2 01/12/2018   EOSPCT 2.6 10/08/2012   BASOPCT 1 01/12/2018   BASOPCT 0.7 10/08/2012    CMP:  Lab Results  Component Value Date   NA 137 01/15/2018   NA 143 01/11/2018   NA 142 08/19/2014   K 3.7 01/15/2018   K 3.7 08/19/2014   CL 102 01/15/2018   CL 106 08/19/2014   CO2 26 01/15/2018   CO2 25 08/19/2014   BUN 24 (H) 01/15/2018   BUN 19 01/11/2018   BUN 18 08/19/2014   CREATININE 1.47 (H) 01/15/2018   CREATININE 1.29 08/19/2014   PROT 7.6 01/12/2018   PROT 7.1 10/07/2012  ALBUMIN 4.8 01/12/2018   ALBUMIN 4.1 10/07/2012   BILITOT 1.1 01/12/2018   BILITOT 0.4 10/07/2012   ALKPHOS 97 01/12/2018   ALKPHOS 85 10/07/2012   AST 24 01/12/2018   AST 83 (H) 10/07/2012   ALT 20 01/12/2018   ALT 43 10/07/2012  .   Total Time in preparing paper work, data evaluation and todays exam - 98 minutes  Phillips Climes M.D on 01/15/2018 at 2:22 PM  [facility]   Office  [phone]

## 2018-01-15 NOTE — Discharge Instructions (Signed)
Follow with Primary MD Derinda Late, MD in 7 days   Get CBC, CMP,  checked  by Primary MD next visit.    Activity: As tolerated with Full fall precautions use walker/cane & assistance as needed   Disposition Home    Diet: Heart Healthy , with feeding assistance and aspiration precautions.  For Heart failure patients - Check your Weight same time everyday, if you gain over 2 pounds, or you develop in leg swelling, experience more shortness of breath or chest pain, call your Primary MD immediately. Follow Cardiac Low Salt Diet and 1.5 lit/day fluid restriction.   On your next visit with your primary care physician please Get Medicines reviewed and adjusted.   Please request your Prim.MD to go over all Hospital Tests and Procedure/Radiological results at the follow up, please get all Hospital records sent to your Prim MD by signing hospital release before you go home.   If you experience worsening of your admission symptoms, develop shortness of breath, life threatening emergency, suicidal or homicidal thoughts you must seek medical attention immediately by calling 911 or calling your MD immediately  if symptoms less severe.  You Must read complete instructions/literature along with all the possible adverse reactions/side effects for all the Medicines you take and that have been prescribed to you. Take any new Medicines after you have completely understood and accpet all the possible adverse reactions/side effects.   Do not drive, operating heavy machinery, perform activities at heights, swimming or participation in water activities or provide baby sitting services if your were admitted for syncope or siezures until you have seen by Primary MD or a Neurologist and advised to do so again.  Do not drive when taking Pain medications.    Do not take more than prescribed Pain, Sleep and Anxiety Medications  Special Instructions: If you have smoked or chewed Tobacco  in the last 2 yrs  please stop smoking, stop any regular Alcohol  and or any Recreational drug use.  Wear Seat belts while driving.   Please note  You were cared for by a hospitalist during your hospital stay. If you have any questions about your discharge medications or the care you received while you were in the hospital after you are discharged, you can call the unit and asked to speak with the hospitalist on call if the hospitalist that took care of you is not available. Once you are discharged, your primary care physician will handle any further medical issues. Please note that NO REFILLS for any discharge medications will be authorized once you are discharged, as it is imperative that you return to your primary care physician (or establish a relationship with a primary care physician if you do not have one) for your aftercare needs so that they can reassess your need for medications and monitor your lab values.

## 2018-01-15 NOTE — Progress Notes (Signed)
Patient ready for d/c.  Family here to provided transport. All personal belongings with pt. Pt in no distress and offers no c/o, All discharge instructions reviewed with pt. Meds reviewed, patient encouraged to keep follow up appts, acknowledgement receilved,

## 2018-01-16 LAB — UREA NITROGEN, URINE: Urea Nitrogen, Ur: 929 mg/dL

## 2018-01-21 ENCOUNTER — Ambulatory Visit
Admission: RE | Admit: 2018-01-21 | Discharge: 2018-01-21 | Disposition: A | Discharge status: Home / Self Care | Payer: PPO | Source: Ambulatory Visit | Attending: Physician Assistant | Admitting: Physician Assistant

## 2018-01-21 DIAGNOSIS — R002 Palpitations: Secondary | ICD-10-CM | POA: Diagnosis not present

## 2018-01-26 ENCOUNTER — Encounter: Payer: Self-pay | Admitting: Physician Assistant

## 2018-01-26 ENCOUNTER — Ambulatory Visit (INDEPENDENT_AMBULATORY_CARE_PROVIDER_SITE_OTHER): Payer: PPO | Admitting: Physician Assistant

## 2018-01-26 VITALS — BP 122/60 | HR 65 | Ht 71.0 in | Wt 203.5 lb

## 2018-01-26 DIAGNOSIS — I480 Paroxysmal atrial fibrillation: Secondary | ICD-10-CM

## 2018-01-26 DIAGNOSIS — I2510 Atherosclerotic heart disease of native coronary artery without angina pectoris: Secondary | ICD-10-CM

## 2018-01-26 DIAGNOSIS — R001 Bradycardia, unspecified: Secondary | ICD-10-CM | POA: Diagnosis not present

## 2018-01-26 DIAGNOSIS — I4891 Unspecified atrial fibrillation: Secondary | ICD-10-CM | POA: Diagnosis not present

## 2018-01-26 DIAGNOSIS — I5032 Chronic diastolic (congestive) heart failure: Secondary | ICD-10-CM | POA: Diagnosis not present

## 2018-01-26 DIAGNOSIS — E785 Hyperlipidemia, unspecified: Secondary | ICD-10-CM

## 2018-01-26 DIAGNOSIS — R002 Palpitations: Secondary | ICD-10-CM

## 2018-01-26 DIAGNOSIS — I10 Essential (primary) hypertension: Secondary | ICD-10-CM | POA: Diagnosis not present

## 2018-01-26 NOTE — Patient Instructions (Signed)
Medication Instructions:  Your physician recommends that you continue on your current medications as directed. Please refer to the Current Medication list given to you today.   Labwork: Your physician recommends that you return for lab work in: TODAY (BMET, MG).   Testing/Procedures: NONE  Follow-Up: Your physician recommends that you schedule a follow-up appointment in: [place].   If you need a refill on your cardiac medications before your next appointment, please call your pharmacy.

## 2018-01-26 NOTE — Progress Notes (Signed)
Cardiology Office Note Date:  01/26/2018  Patient ID:  Benjamin Brewer, Benjamin Brewer [birth year], MRN [id number] PCP:  Derinda Late, MD  Cardiologist:  Dr. Fletcher Anon, MD   Chief Complaint: Follow up  History of Present Illness: Benjamin Brewer is a 84 y.o. male with history of CAD, PAF on Eliquis, HTN, HLD, and anxiety who presents for follow up of his Holter monitor.  Patient underwent PCI/DES to the LAD and RCA in 2007-10-15. Follow up nuclear stress testing in October 15, 2011 showed no evidence of ischemia with normal EF. In 10/14/13, he had an episode of Afib with RVR and has been on Eliquis and beta blocker since. Echo in 15-Oct-2014 showed an EF of 55-60%, mild MR, and a mildly dilated right atrium. In follow up in 08/2017 he was doing well. Patient's wife passed away in 14-Oct-2017 and the patient has dealt with a significant amount of grief, anxiety, and depression since. He was seen in our office on 7/2 with increased ankle edema as well as palpitations and left arm paresthesias. Symptoms were not similar to his prior Afib. There was no chest pain. Weight was noted to be down 2 pounds at 210 from his office visit in 08/2017 when he weighed 212 pounds. He underwent 48-hour Holter monitor with preliminary report showing NSR with an average heart rate of 65 bpm and range from 43-164 bpm. Thre were 55 PVCs with an isolated triplet. There were 783 PACs with a rare couplet and 1 run of atrial tach lasating 70 beats. Labs including TSH were unrevealing. He was started on torsemide. He was seen in the ED on 7/3 with left arm and leg paresthesias with CT head being normal, MRI brain not acute. Cardiac enzyme negative. He presented to [facility] on 7[facility]5 with weakness, left shoulder pain and paresthesias. His lower extremity swelling had nearly resolved with the addition of torsemide. Echo on 7/6 showed an EF of 60-65%, no RWMA, Gr1DD, aortic sclerosis without stenosis, trivial MR, mildly dilated LA, RVSF normal, trivial TR/PR, PASP normal. CXR  normal, left shoulder film normal. Labs were notable for a SCr up to 1.66 (prior 1.25) trending to 1.47, BNP 17.8, CK 78, negative cardiac enzymes, CBC normal, K+ 3.7. He was mildly bradycardic and his Toprol XL was decreased to 12.5 mg daily. He was also taken off torsemide and placed on prn Lasix 20 mg. Lastly, he was started in [place] as there was suspicion that anxiety/greif from the loss of his wife could be playing a significant role in his symptoms.   He comes in doing well today. He does continue to grieve the loss of his wife, though is in a better place mentally at this time. His left arm paresthesias have resolved. His palpitations have resolved as well. No chest pain/discomfort, dizziness, presyncope, or syncope. His lower extremity swelling as resolved and he has not needed to take prn Lasix outside of a couple of times. He does continue to eat out at restaurants mostly. He ate a hotdog at a [employer] prior to his appointment today. No orhtopnea, PND, abdominal swelling, or early satiety.   Past Medical History:  Diagnosis Date  . Benign prostatic hypertrophy    Hx of   . CAD (coronary artery disease)    native vessel. Status post LAD and RCA angioplasty and drug-eluting stent placement in Oct 15, 2007  . Dyslipidemia   . History of tachycardia   . Hyperlipidemia   . Hypertension   . Intermediate coronary syndrome ([place])  angina, unstable  . Other dyspnea and respiratory abnormality    on exertion    Past Surgical History:  Procedure Laterality Date  . [place]; x2  . [place]  . ESOPHAGOGASTRODUODENOSCOPY (EGD) WITH PROPOFOL N/A 06/10/2015   Procedure: ESOPHAGOGASTRODUODENOSCOPY (EGD) WITH PROPOFOL;  Surgeon: Manya Silvas, MD;  Location: [facility] ENDOSCOPY;  Service: Endoscopy;  Laterality: N/A;  . HERNIA REPAIR    . SAVORY DILATION N/A 06/10/2015   Procedure: SAVORY  DILATION;  Surgeon: Manya Silvas, MD;  Location: [facility] ENDOSCOPY;  Service: Endoscopy;  Laterality: N/A;  . TONSILLECTOMY      Current Meds  Medication Sig  . aspirin EC 81 MG tablet Take 1 tablet (81 mg total) by mouth daily.  Marland Kitchen atorvastatin (LIPITOR) 40 MG tablet TAKE 1 TABLET BY MOUTH ONCE DAILY (Patient taking differently: TAKE 40MG  BY MOUTH EVERY EVENING)  . busPIRone (BUSPAR) 7.5 MG tablet Take 1 tablet (7.5 mg total) by mouth 2 (two) times daily.  . cyanocobalamin 500 MCG tablet Take 500 mcg by mouth daily.  Marland Kitchen ELIQUIS 5 MG TABS tablet TAKE 1 TABLET BY MOUTH TWICE DAILY (Patient taking differently: TAKE 5MG  BY MOUTH TWICE DAILY)  . ferrous sulfate (FERRO-BOB) 325 (65 FE) MG tablet Take 325 mg by mouth daily.    . fluticasone (FLONASE) 50 MCG/ACT nasal spray Place 1-2 sprays into both nostrils daily.  . furosemide (LASIX) 20 MG tablet Take 1 tablet (20 mg total) by mouth daily as needed for fluid or edema.  . metoprolol succinate (TOPROL-XL) 25 MG 24 hr tablet Take 0.5 tablets (12.5 mg total) by mouth daily.  . Multiple Vitamins-Minerals (EYE VITAMINS PO) Take by mouth 2 (two) times daily.   . nitroGLYCERIN (NITROSTAT) 0.4 MG SL tablet place 1 tablet under the tongue if needed every 5 minutes fo (Patient taking differently: place 1 tablet (0.4mg ) under the tongue every 5 minutes as needed for chest pain)  . pantoprazole (PROTONIX) 40 MG tablet TAKE 1 TABLET BY MOUTH ONCE DAILY (Patient taking differently: TAKE 40 MG BY MOUTH EVERY EVENING)  . traZODone (DESYREL) 50 MG tablet Take 1 tablet by mouth at bedtime.    Allergies:   Terazosin   Social History:  The patient  reports that he has never smoked. He has never used smokeless tobacco. He reports that he drinks alcohol. He reports that he does not use drugs.   Family History:  The patient's family history includes Heart attack in his mother; Hypertension in his mother; Stroke in his mother and unknown relative.  ROS:   Review of  Systems  Constitutional: Negative for chills, diaphoresis, fever, malaise/fatigue and weight loss.  HENT: Negative for congestion.   Eyes: Negative for discharge and redness.  Respiratory: Negative for cough, hemoptysis, sputum production, shortness of breath and wheezing.   Cardiovascular: Negative for chest pain, palpitations, orthopnea, claudication, leg swelling and PND.  Gastrointestinal: Negative for abdominal pain, blood in stool, heartburn, melena, nausea and vomiting.  Genitourinary: Negative for hematuria.  Musculoskeletal: Negative for falls and myalgias.  Skin: Negative for rash.  Neurological: Negative for dizziness, tingling, tremors, sensory change, speech change, focal weakness, loss of consciousness and weakness.  Endo/Heme/Allergies: Does not bruise/bleed easily.  Psychiatric/Behavioral: Positive for depression. Negative for substance abuse. The patient is nervous/anxious and has insomnia.   All other systems reviewed and are negative.    PHYSICAL  EXAM:  VS:  BP 122/60 (BP Location: Left Arm, Patient Position: Sitting, Cuff Size: Normal)   Pulse 65   Ht 5\' 11"  (1.803 m)   Wt 203 lb 8 oz (92.3 kg)   BMI 28.38 kg/m  BMI: Body mass index is 28.38 kg/m.  Physical Exam  Constitutional: He is oriented to person, place, and time. He appears well-developed and well-nourished.  HENT:  Head: Normocephalic and atraumatic.  Eyes: Right eye exhibits no discharge. Left eye exhibits no discharge.  Neck: Normal range of motion. No JVD present.  Cardiovascular: Normal rate, regular rhythm, S1 normal, S2 normal and normal heart sounds. Exam reveals no distant heart sounds, no friction rub, no midsystolic click and no opening snap.  No murmur heard. Pulses:      Posterior tibial pulses are 2+ on the right side, and 2+ on the left side.  Pulmonary/Chest: Effort normal and breath sounds normal. No respiratory distress. He has no decreased breath sounds. He has no wheezes. He has no  rales. He exhibits no tenderness.  Abdominal: Soft. He exhibits no distension. There is no tenderness.  Musculoskeletal: He exhibits no edema.  Neurological: He is alert and oriented to person, place, and time.  Skin: Skin is warm and dry. No cyanosis. Nails show no clubbing.  Psychiatric: He has a normal mood and affect. His speech is normal and behavior is normal. Judgment and thought content normal.     EKG:  Was ordered and interpreted by me today. Shows NSR, 65 bpm, low voltage QRS, possible prior inferior infarct, no acute st/t changes  Recent Labs: 01/11/2018: TSH 1.970 01/12/2018: ALT 20 01/14/2018: B Natriuretic Peptide 17.8; Hemoglobin 13.7; Platelets 218 01/15/2018: BUN 24; Creatinine, Ser 1.47; Potassium 3.7; Sodium 137  No results found for requested labs within last 8760 hours.   Estimated Creatinine Clearance: 42.7 mL/min (A) (by C-G formula based on SCr of 1.47 mg/dL (H)).   Wt Readings from Last 3 Encounters:  01/26/18 203 lb 8 oz (92.3 kg)  01/15/18 197 lb 11.2 oz (89.7 kg)  01/12/18 210 lb (95.3 kg)     Other studies reviewed: Additional studies/records reviewed today include: summarized above  ASSESSMENT AND PLAN:  1. HFpEF: He does not appear to be volume up at this time. His weight is down 7 pounds from his last office visit on 01/11/18. Continue prn Lasix 20 mg for weight gain, swelling, or SOB. Check bmet today. CHF education.   2. Palpitations: Holter monitor noted as above. No longer symptomatic. Continue Toprol XL. Recent tsh normal. Check bmet and magnesium.   3. Bradycardia: Improved with decreasing Toprol XL to 12.5 mg daily.  Recent tsh normal.   4. CAD of the native coronary arteries without angina: No symptoms concerning for angina. Continue medical therapy and secondary prevention.   5. PAF: Maintaining sinus rhythm. Continue Toprol XL and Eliquis. Check bmet as above. If his SCr is > 1.5 he would qualify for reduced dose Eliquis. Could also consider  changing to Xarelto based on his CrCl in an effort to maintain stable, accurate dosing.   6. HTN: Blood pressure is well controlled today. Continue current medications.   7. HLD: Lipitor. Recent LDL of 55 from 11/2017.   Disposition: F/u with Dr. Fletcher Anon or an APP in 6 months.   Current medicines are reviewed at length with the patient today.  The patient did not have any concerns regarding medicines.  Signed, Christell Faith, PA-C 01/26/2018 3:25 PM  [address] [place], [place] [address] [phone]

## 2018-01-27 LAB — BASIC METABOLIC PANEL
BUN/Creatinine Ratio: 12 (ref 10–24)
BUN: 16 mg/dL (ref 8–27)
CO2: 22 mmol/L (ref 20–29)
CREATININE: 1.32 mg/dL — AB (ref 0.76–1.27)
Calcium: 9.1 mg/dL (ref 8.6–10.2)
Chloride: 104 mmol/L (ref 96–106)
GFR calc Af Amer: 56 mL/min/{1.73_m2} — ABNORMAL LOW (ref >59)
GFR, EST NON AFRICAN AMERICAN: 49 mL/min/{1.73_m2} — AB (ref >59)
Glucose: 128 mg/dL — ABNORMAL HIGH (ref 65–99)
Potassium: 3.7 mmol/L (ref 3.5–5.2)
SODIUM: 140 mmol/L (ref 134–144)

## 2018-01-27 LAB — MAGNESIUM: Magnesium: 2.3 mg/dL (ref 1.6–2.3)

## 2018-01-30 ENCOUNTER — Other Ambulatory Visit: Payer: Self-pay | Admitting: Cardiovascular Disease

## 2018-01-31 NOTE — Telephone Encounter (Signed)
Please review for refill, Thanks !  

## 2018-02-03 ENCOUNTER — Other Ambulatory Visit: Payer: Self-pay | Admitting: Cardiovascular Disease

## 2018-02-04 ENCOUNTER — Telehealth: Payer: Self-pay | Admitting: Physician Assistant

## 2018-02-04 NOTE — Telephone Encounter (Signed)
Pt calling stating he's not received a call back about his Lab results  Please call back

## 2018-02-04 NOTE — Telephone Encounter (Signed)
Medication sent 01/31/2018  pantoprazole (PROTONIX) 40 MG tablet 30 tablet 0 01/31/2018    Sig: TAKE 1 TABLET BY MOUTH ONCE DAILY   Sent to pharmacy as: pantoprazole (PROTONIX) 40 MG tablet   E-Prescribing Status: Receipt confirmed by pharmacy (01/31/2018 10:07 AM EDT)   Pharmacy   [place], [place]

## 2018-02-04 NOTE — Telephone Encounter (Signed)
Patient verbalized understanding of lab results and recommendations from 01/26/18.

## 2018-02-10 ENCOUNTER — Telehealth: Payer: Self-pay | Admitting: Cardiovascular Disease

## 2018-02-10 NOTE — Telephone Encounter (Signed)
Patient returning call for results 

## 2018-02-10 NOTE — Telephone Encounter (Signed)
See result note. Patient aware of results.

## 2018-03-10 ENCOUNTER — Other Ambulatory Visit: Payer: Self-pay | Admitting: Cardiovascular Disease

## 2018-03-24 ENCOUNTER — Other Ambulatory Visit: Payer: Self-pay | Admitting: Cardiovascular Disease

## 2018-03-25 ENCOUNTER — Other Ambulatory Visit: Payer: Self-pay | Admitting: Cardiovascular Disease

## 2018-03-31 ENCOUNTER — Other Ambulatory Visit: Payer: Self-pay | Admitting: Cardiovascular Disease

## 2018-04-04 ENCOUNTER — Other Ambulatory Visit: Payer: Self-pay | Admitting: *Deleted

## 2018-04-04 ENCOUNTER — Telehealth: Payer: Self-pay | Admitting: Cardiovascular Disease

## 2018-04-04 MED ORDER — METOPROLOL SUCCINATE ER 25 MG PO TB24: 12.5000 mg | ORAL_TABLET | Freq: Every day | ORAL | 1 refills | Status: DC

## 2018-04-04 NOTE — Telephone Encounter (Signed)
°*  STAT* If patient is at the pharmacy, call can be transferred to refill team.   1. Which medications need to be refilled? (please list name of each medication and dose if known) Metoprolol 12.5 mg po q day   2. Which pharmacy/location (including street and city if local pharmacy) is medication to be sent to? [place]   3. Do they need a 30 day or 90 day supply? 90     Patient has been out of medication for several days and says the pharmacy has sent numerous requests.  Patient leaving today to go out of town and needs today.

## 2018-04-04 NOTE — Telephone Encounter (Signed)
Requested Prescriptions   Signed Prescriptions Disp Refills  . metoprolol succinate (TOPROL-XL) 25 MG 24 hr tablet 45 tablet 1    Sig: Take 0.5 tablets (12.5 mg total) by mouth daily.    Authorizing Provider: Rise Mu    Ordering User: Britt Bottom

## 2018-04-14 DIAGNOSIS — L821 Other seborrheic keratosis: Secondary | ICD-10-CM | POA: Diagnosis not present

## 2018-04-14 DIAGNOSIS — L918 Other hypertrophic disorders of the skin: Secondary | ICD-10-CM | POA: Diagnosis not present

## 2018-04-14 DIAGNOSIS — D229 Melanocytic nevi, unspecified: Secondary | ICD-10-CM | POA: Diagnosis not present

## 2018-04-14 DIAGNOSIS — Z23 Encounter for immunization: Secondary | ICD-10-CM | POA: Diagnosis not present

## 2018-04-14 DIAGNOSIS — L820 Inflamed seborrheic keratosis: Secondary | ICD-10-CM | POA: Diagnosis not present

## 2018-04-25 DIAGNOSIS — R300 Dysuria: Secondary | ICD-10-CM | POA: Diagnosis not present

## 2018-04-25 DIAGNOSIS — R350 Frequency of micturition: Secondary | ICD-10-CM | POA: Diagnosis not present

## 2018-04-25 DIAGNOSIS — R8290 Unspecified abnormal findings in urine: Secondary | ICD-10-CM | POA: Diagnosis not present

## 2018-04-27 ENCOUNTER — Other Ambulatory Visit: Payer: Self-pay | Admitting: Cardiovascular Disease

## 2018-05-09 DIAGNOSIS — R195 Other fecal abnormalities: Secondary | ICD-10-CM | POA: Diagnosis not present

## 2018-05-20 DIAGNOSIS — Z79899 Other long term (current) drug therapy: Secondary | ICD-10-CM | POA: Diagnosis not present

## 2018-05-20 DIAGNOSIS — E7800 Pure hypercholesterolemia, unspecified: Secondary | ICD-10-CM | POA: Diagnosis not present

## 2018-05-24 DIAGNOSIS — Z23 Encounter for immunization: Secondary | ICD-10-CM | POA: Diagnosis not present

## 2018-05-24 DIAGNOSIS — N183 Chronic kidney disease, stage 3 (moderate): Secondary | ICD-10-CM | POA: Diagnosis not present

## 2018-05-24 DIAGNOSIS — Z79899 Other long term (current) drug therapy: Secondary | ICD-10-CM | POA: Diagnosis not present

## 2018-05-24 DIAGNOSIS — I10 Essential (primary) hypertension: Secondary | ICD-10-CM | POA: Diagnosis not present

## 2018-05-24 DIAGNOSIS — Z0000 Encounter for general adult medical examination without abnormal findings: Secondary | ICD-10-CM | POA: Diagnosis not present

## 2018-05-24 DIAGNOSIS — E7800 Pure hypercholesterolemia, unspecified: Secondary | ICD-10-CM | POA: Diagnosis not present

## 2018-06-28 ENCOUNTER — Other Ambulatory Visit: Payer: Self-pay | Admitting: Cardiovascular Disease

## 2018-06-28 ENCOUNTER — Encounter: Payer: Self-pay | Admitting: Emergency Medicine

## 2018-06-28 ENCOUNTER — Other Ambulatory Visit: Payer: Self-pay

## 2018-06-28 ENCOUNTER — Emergency Department
Admission: EM | Admit: 2018-06-28 | Discharge: 2018-06-28 | Disposition: A | Discharge status: Home / Self Care | Payer: PPO | Attending: Emergency Medicine | Admitting: Emergency Medicine

## 2018-06-28 ENCOUNTER — Emergency Department: Payer: PPO

## 2018-06-28 DIAGNOSIS — Z79899 Other long term (current) drug therapy: Secondary | ICD-10-CM | POA: Insufficient documentation

## 2018-06-28 DIAGNOSIS — I2510 Atherosclerotic heart disease of native coronary artery without angina pectoris: Secondary | ICD-10-CM | POA: Insufficient documentation

## 2018-06-28 DIAGNOSIS — E785 Hyperlipidemia, unspecified: Secondary | ICD-10-CM | POA: Diagnosis not present

## 2018-06-28 DIAGNOSIS — I10 Essential (primary) hypertension: Secondary | ICD-10-CM | POA: Insufficient documentation

## 2018-06-28 DIAGNOSIS — R000 Tachycardia, unspecified: Secondary | ICD-10-CM | POA: Diagnosis present

## 2018-06-28 DIAGNOSIS — R002 Palpitations: Secondary | ICD-10-CM | POA: Diagnosis not present

## 2018-06-28 LAB — COMPREHENSIVE METABOLIC PANEL
ALBUMIN: 4.4 g/dL (ref 3.5–5.0)
ALT: 17 U/L (ref 0–44)
AST: 24 U/L (ref 15–41)
Alkaline Phosphatase: 79 U/L (ref 38–126)
Anion gap: 8 (ref 5–15)
BUN: 25 mg/dL — AB (ref 8–23)
CO2: 24 mmol/L (ref 22–32)
CREATININE: 1.21 mg/dL (ref 0.61–1.24)
Calcium: 8.9 mg/dL (ref 8.9–10.3)
Chloride: 106 mmol/L (ref 98–111)
GFR calc Af Amer: >60 mL/min (ref >60)
GFR calc non Af Amer: 54 mL/min — ABNORMAL LOW (ref >60)
GLUCOSE: 75 mg/dL (ref 70–99)
POTASSIUM: 3.5 mmol/L (ref 3.5–5.1)
SODIUM: 138 mmol/L (ref 135–145)
Total Bilirubin: 0.6 mg/dL (ref 0.3–1.2)
Total Protein: 7 g/dL (ref 6.5–8.1)

## 2018-06-28 LAB — CBC WITH DIFFERENTIAL/PLATELET
ABS IMMATURE GRANULOCYTES: 0.02 10*3/uL (ref 0.00–0.07)
BASOS ABS: 0.1 10*3/uL (ref 0.0–0.1)
BASOS PCT: 1 %
Eosinophils Absolute: 0.4 10*3/uL (ref 0.0–0.5)
Eosinophils Relative: 5 %
HCT: 38.5 % — ABNORMAL LOW (ref 39.0–52.0)
Hemoglobin: 12.8 g/dL — ABNORMAL LOW (ref 13.0–17.0)
IMMATURE GRANULOCYTES: 0 %
Lymphocytes Relative: 21 %
Lymphs Abs: 1.6 10*3/uL (ref 0.7–4.0)
MCH: 30 pg (ref 26.0–34.0)
MCHC: 33.2 g/dL (ref 30.0–36.0)
MCV: 90.2 fL (ref 80.0–100.0)
MONOS PCT: 14 %
Monocytes Absolute: 1 10*3/uL (ref 0.1–1.0)
NEUTROS ABS: 4.4 10*3/uL (ref 1.7–7.7)
NEUTROS PCT: 59 %
NRBC: 0 % (ref 0.0–0.2)
PLATELETS: 274 10*3/uL (ref 150–400)
RBC: 4.27 MIL/uL (ref 4.22–5.81)
RDW: 13 % (ref 11.5–15.5)
WBC: 7.4 10*3/uL (ref 4.0–10.5)

## 2018-06-28 LAB — TROPONIN I: Troponin I: <0.03 ng/mL (ref <0.03)

## 2018-06-28 NOTE — ED Triage Notes (Signed)
Pt to triage via w/c with no distress noted; st since yesterday has sensation of heart racing; denies pain, denies accomp symptoms; st hx afib

## 2018-06-28 NOTE — ED Provider Notes (Signed)
[facility] Emergency Department Provider Note _________   First MD Initiated Contact with Patient 06/28/18 [phone]     (approximate)  I have reviewed the triage vital signs and the nursing notes.   HISTORY  Chief Complaint Palpitations    HPI Benjamin Brewer is a 84 y.o. male with below list of chronic medical conditions presents to the emergency department with history of sensation of a rapid heartbeat "heart racing" since yesterday intermittently.  Patient states that he took his pulse at home manually and noted a beat of 100.  Patient denies any chest pain no shortness of breath no nausea vomiting dizziness or diaphoresis.  Patient states that his metoprolol was decreased secondary to sinus bradycardia earlier this year.   Past Medical History:  Diagnosis Date  . Benign prostatic hypertrophy    Hx of   . CAD (coronary artery disease)    native vessel. Status post LAD and RCA angioplasty and drug-eluting stent placement in 2009  . Dyslipidemia   . History of tachycardia   . Hyperlipidemia   . Hypertension   . Intermediate coronary syndrome (HCC)    angina, unstable  . Other dyspnea and respiratory abnormality    on exertion    Patient Active Problem List   Diagnosis Date Noted  . Acute renal failure superimposed on stage 3 chronic kidney disease ([place]) 01/14/2018  . Left arm numbness 01/14/2018  . Generalized weakness 01/14/2018  . Left shoulder pain   . Atrial fibrillation ([place]) 08/20/2014  . BPPV (benign paroxysmal positional vertigo) 10/23/2013  . Sinus bradycardia 06/16/2013  . Essential hypertension 06/16/2013  . CAD, NATIVE VESSEL 07/03/2009  . DYSPNEA ON EXERTION 12/21/2008  . Hyperlipidemia 12/15/2008  . BENIGN PROSTATIC HYPERTROPHY, HX OF 12/15/2008    Past Surgical History:  Procedure Laterality Date  . [place]; x2  . [place]  . ESOPHAGOGASTRODUODENOSCOPY (EGD) WITH PROPOFOL N/A 06/10/2015   Procedure: ESOPHAGOGASTRODUODENOSCOPY (EGD) WITH PROPOFOL;  Surgeon: Manya Silvas, MD;  Location: [facility] ENDOSCOPY;  Service: Endoscopy;  Laterality: N/A;  . HERNIA REPAIR    . SAVORY DILATION N/A 06/10/2015   Procedure: SAVORY DILATION;  Surgeon: Manya Silvas, MD;  Location: [facility] ENDOSCOPY;  Service: Endoscopy;  Laterality: N/A;  . TONSILLECTOMY      Prior to Admission medications   Medication Sig Start Date End Date Taking? Authorizing Provider  atorvastatin (LIPITOR) 40 MG tablet TAKE 40MG  BY MOUTH EVERY EVENING 03/31/18   Wellington Hampshire, MD  busPIRone (BUSPAR) 7.5 MG tablet Take 1 tablet (7.5 mg total) by mouth 2 (two) times daily. 01/15/18   Elgergawy, Silver Huguenin, MD  cyanocobalamin 500 MCG tablet Take 500 mcg by mouth daily.    [provider]  ELIQUIS 5 MG TABS tablet TAKE 1 TABLET BY MOUTH TWICE DAILY 01/31/18   Rise Mu, PA-C  ferrous sulfate (FERRO-BOB) 325 (65 FE) MG tablet Take 325 mg by mouth daily.      [provider]  fluticasone (FLONASE) 50 MCG/ACT nasal spray Place 1-2 sprays into both nostrils daily. 10/23/13   Arguello, Roger A, PA-C  furosemide (LASIX) 20 MG tablet Take 1 tablet (20 mg total) by mouth daily as needed for fluid or edema. 01/15/18   Elgergawy, Silver Huguenin, MD  metoprolol succinate (TOPROL-XL) 25 MG 24 hr tablet Take 0.5 tablets (12.5 mg total) by mouth  daily. 04/04/18   Rise Mu, PA-C  Multiple Vitamins-Minerals (EYE VITAMINS PO) Take by mouth 2 (two) times daily.     [provider]  nitroGLYCERIN (NITROSTAT) 0.4 MG SL tablet place 1 tablet under the tongue if needed every 5 minutes fo Patient taking differently: place 1 tablet (0.4mg ) under the tongue every 5 minutes as needed for chest pain 03/29/17   Wellington Hampshire, MD  pantoprazole (PROTONIX) 40 MG tablet TAKE 1 TABLET BY MOUTH EVERYDAY 04/27/18   Rise Mu, PA-C  traZODone (DESYREL) 50 MG tablet  Take 1 tablet by mouth at bedtime. 11/12/17   [provider]    Allergies Terazosin  Family History  Problem Relation Age of Onset  . Stroke Mother   . Heart attack Mother   . Hypertension Mother   . Stroke Other        family history    Social History Social History   Tobacco Use  . Smoking status: Never Smoker  . Smokeless tobacco: Never Used  Substance Use Topics  . Alcohol use: Yes    Comment: occasional   . Drug use: No    Review of Systems Constitutional: No fever/chills Eyes: No visual changes. ENT: No sore throat. Cardiovascular: Denies chest pain.  Positive for palpitations. Respiratory: Denies shortness of breath. Gastrointestinal: No abdominal pain.  No nausea, no vomiting.  No diarrhea.  No constipation. Genitourinary: Negative for dysuria. Musculoskeletal: Negative for neck pain.  Negative for back pain. Integumentary: Negative for rash. Neurological: Negative for headaches, focal weakness or numbness.  ____________________________________________   PHYSICAL EXAM:  VITAL SIGNS: ED Triage Vitals  Enc Vitals Group     BP 06/28/18 0259 (!) 164/80     Pulse Rate 06/28/18 0259 82     Resp 06/28/18 0259 18     Temp 06/28/18 0259 97.8 F (36.6 C)     Temp Source 06/28/18 0259 Oral     SpO2 06/28/18 0259 97 %     Weight 06/28/18 0256 93 kg (205 lb)     Height 06/28/18 0256 1.803 m (5\' 11" )     Head Circumference --      Peak Flow --      Pain Score 06/28/18 0256 0     Pain Loc --      Pain Edu? --      Excl. in [place]? --     Constitutional: Alert and oriented. Well appearing and in no acute distress. Eyes: Conjunctivae are normal.. Mouth/Throat: Mucous membranes are moist. Neck: No stridor.  Cardiovascular: Normal rate, regular rhythm. Good peripheral circulation. Grossly normal heart sounds. Respiratory: Normal respiratory effort.  No retractions. Lungs CTAB. Gastrointestinal: Soft and nontender. No distention.  Musculoskeletal: No  lower extremity tenderness nor edema. No gross deformities of extremities. Neurologic:  Normal speech and language. No gross focal neurologic deficits are appreciated.  Skin:  Skin is warm, dry and intact. No rash noted. Psychiatric: Mood and affect are normal. Speech and behavior are normal.  ____________________________________________   LABS (all labs ordered are listed, but only abnormal results are displayed)  Labs Reviewed  CBC WITH DIFFERENTIAL/PLATELET - Abnormal; Notable for the following components:      Result Value   Hemoglobin 12.8 (*)    HCT 38.5 (*)    All other components within normal limits  COMPREHENSIVE METABOLIC PANEL - Abnormal; Notable for the following components:   BUN 25 (*)    GFR calc non Af Amer 54 (*)  All other components within normal limits  TROPONIN I   ____________________________________________  EKG  ED ECG REPORT I, [place] N BROWN, the attending physician, personally viewed and interpreted this ECG.   Date: 06/28/2018  EKG Time: 3:00 AM  Rate: 77  Rhythm: Normal Sinus Rhythm  Axis: Normal  Intervals:Normal  ST&T Change: None  ____________________________________________  RADIOLOGY I, [place] N BROWN, personally viewed and evaluated these images (plain radiographs) as part of my medical decision making, as well as reviewing the written report by the radiologist.  ED MD interpretation: No acute chest findings on chest x-ray per radiologist.  Official radiology report(s): Dg Chest 2 View  Result Date: 06/28/2018 CLINICAL DATA:  Palpitations. EXAM: CHEST - 2 VIEW COMPARISON:  01/14/2018 FINDINGS: The cardiomediastinal contours are normal. Tortuous descending aorta, as before. Pulmonary vasculature is normal. No consolidation, pleural effusion, or pneumothorax. No acute osseous abnormalities are seen. Chronic mild compression fracture in the lower thoracic spine. IMPRESSION: No acute chest finding. Electronically Signed   By:  Keith Rake M.D.   On: 06/28/2018 03:26      Procedures   ____________________________________________   INITIAL IMPRESSION / ASSESSMENT AND PLAN / ED COURSE  As part of my medical decision making, I reviewed the following data within the electronic MEDICAL RECORD NUMBER  84 year old male presenting with above-stated history and physical exam secondary to rapid heartbeat noted at home.  Patient normal sinus rhythm with a heart rate of 77 in the emergency department.  Patient with no chest pain.  Laboratory data unremarkable chest x-ray likewise negative.  Patient referred to cardiology for further outpatient evaluation. ____________________________________________  FINAL CLINICAL IMPRESSION(S) / ED DIAGNOSES  Final diagnoses:  Heart palpitations     MEDICATIONS GIVEN DURING THIS VISIT:  Medications - No data to display   ED Discharge Orders    None       Note:  This document was prepared using Dragon voice recognition software and may include unintentional dictation errors.    Gregor Hams, MD 06/28/18 2250

## 2018-06-29 ENCOUNTER — Other Ambulatory Visit: Payer: Self-pay | Admitting: Physician Assistant

## 2018-07-10 DIAGNOSIS — J029 Acute pharyngitis, unspecified: Secondary | ICD-10-CM | POA: Diagnosis not present

## 2018-07-14 DIAGNOSIS — H33303 Unspecified retinal break, bilateral: Secondary | ICD-10-CM | POA: Diagnosis not present

## 2018-07-14 DIAGNOSIS — H353211 Exudative age-related macular degeneration, right eye, with active choroidal neovascularization: Secondary | ICD-10-CM | POA: Diagnosis not present

## 2018-07-18 NOTE — Progress Notes (Signed)
Cardiology Office Note Date:  07/19/2018  Patient ID:  Benjamin Brewer 1930/10/17, MRN [id number] PCP:  Derinda Late, MD  Cardiologist:  Dr. Fletcher Anon, MD    Chief Complaint: ED follow-up  History of Present Illness: Benjamin Brewer is a 85 y.o. male with history of CAD as outlined below, PAF on Eliquis, HTN, HLD, and anxiety who presents for follow up of recent ED visit for palpitations.  Patient underwent PCI/DES to the LAD and RCA in 2007-10-28. Follow up nuclear stress testing in 2011-10-28 showed no evidence of ischemia with normal EF. In 10-27-2013, he had an episode of Afib with RVR and has been on Eliquis and beta blocker since. Echo in 10/28/2014 showed an EF of 55-60%, mild MR, and a mildly dilated right atrium. In follow up in 08/2017 he was doing well. Patient's wife passed away in 10-27-17 and the patient has dealt with a significant amount of grief, anxiety, and depression since. He was seen in our office on 7/2 with increased ankle edema as well as palpitations and left arm paresthesias. Symptoms were not similar to his prior Afib. There was no chest pain. Weight was noted to be down 2 pounds at 210 from his office visit in 08/2017 when he weighed 212 pounds. He underwent 48-hour Holter monitor that showed NSR with an average heart rate of 65 bpm and range from 43-164 bpm.  The longest pause was 1.64 seconds. Thre were 55 PVCs with an isolated triplet. There were 783 PACs with a rare couplet and 1 run of atrial tach lasating 70 beats. Some of the patient's symptoms corresponded to PACs/PVCs. Labs including TSH were unrevealing. He was started on torsemide. He was seen in the ED on 7/3 with left arm and leg paresthesias with CT head being normal, MRI brain not acute. Cardiac enzyme negative. He presented to [facility] on 7/5 with weakness, left shoulder pain and paresthesias. His lower extremity swelling had nearly resolved with the addition of torsemide. Echo on 7/6 showed an EF of 60-65%, no RWMA, Gr1DD,  aortic sclerosis without stenosis, trivial MR, mildly dilated LA, RVSF normal, trivial TR/PR, PASP normal. CXR normal, left shoulder film normal. BNP 17.8, negative cardiac enzymes. He was mildly bradycardic and his Toprol XL was decreased to 12.5 mg daily. He was also taken off torsemide and placed on prn Lasix 20 mg. He was started in [place] as there was suspicion that anxiety/greif from the loss of his wife could be playing a significant role in his symptoms.   In hospital follow-up on 01/26/2017 he was doing well and continued to grieve the loss of his wife.  His left arm paresthesias and palpitations had resolved.  His weight was down 7 pounds from his visit earlier in July.  He was maintaining sinus rhythm and continued on Toprol-XL and Eliquis.  Patient was seen in the [facility] ED on 06/28/2018 for intermittent palpitations.  Patient reported a home heart rate of 100 bpm.  BP noted to be 164/80 with a heart rate of 82 bpm.  EKG showed normal sinus rhythm with nonspecific ST-T changes.  Troponin negative x1.  Potassium 3.5, serum creatinine 1.21, Hgb 12.8.  Chest x-ray with no acute findings.  Outpatient cardiology follow-up was recommended.  Patient comes in doing well from a cardiac perspective.  He notes resolution of palpitations.  He reports he is able to feel his heart rate when it gets into the 90s to low 100s beats per minute.  He has  been dealing with recent UTIs, diarrhea, and upper respiratory infection which he feels like was playing a role in his palpitations.  He also has not been sleeping well at night though has not been taking his as needed trazodone.  His weight has remained stable at home.  He denies any lower extremity swelling, abdominal distention, orthopnea, PND, or early satiety.  No dizziness, presyncope, or syncope.  No chest pain.  He has not needed any of his PRN Lasix.  Blood pressure since dealing with his URI has been elevated in the 981X over 91Y systolic at home.  He otherwise  does not have any issues or concerns at this time.   Past Medical History:  Diagnosis Date  . Benign prostatic hypertrophy    Hx of   . CAD (coronary artery disease)    native vessel. Status post LAD and RCA angioplasty and drug-eluting stent placement in 2009  . Dyslipidemia   . History of tachycardia   . Hyperlipidemia   . Hypertension   . Intermediate coronary syndrome (HCC)    angina, unstable  . Other dyspnea and respiratory abnormality    on exertion    Past Surgical History:  Procedure Laterality Date  . [place]; x2  . [place]  . ESOPHAGOGASTRODUODENOSCOPY (EGD) WITH PROPOFOL N/A 06/10/2015   Procedure: ESOPHAGOGASTRODUODENOSCOPY (EGD) WITH PROPOFOL;  Surgeon: Manya Silvas, MD;  Location: [facility] ENDOSCOPY;  Service: Endoscopy;  Laterality: N/A;  . HERNIA REPAIR    . SAVORY DILATION N/A 06/10/2015   Procedure: SAVORY DILATION;  Surgeon: Manya Silvas, MD;  Location: [facility] ENDOSCOPY;  Service: Endoscopy;  Laterality: N/A;  . TONSILLECTOMY      Current Meds  Medication Sig  . atorvastatin (LIPITOR) 40 MG tablet TAKE 1 TABLET BY MOUTH EVERY EVENING  . busPIRone (BUSPAR) 7.5 MG tablet Take 1 tablet (7.5 mg total) by mouth 2 (two) times daily.  . cyanocobalamin 500 MCG tablet Take 500 mcg by mouth daily.  Marland Kitchen ELIQUIS 5 MG TABS tablet TAKE 1 TABLET BY MOUTH TWICE DAILY  . ferrous sulfate (FERRO-BOB) 325 (65 FE) MG tablet Take 325 mg by mouth daily.    . fluticasone (FLONASE) 50 MCG/ACT nasal spray Place 1-2 sprays into both nostrils daily.  . furosemide (LASIX) 20 MG tablet Take 1 tablet (20 mg total) by mouth daily as needed for fluid or edema.  . metoprolol succinate (TOPROL-XL) 25 MG 24 hr tablet TAKE 1/2 TABLET BY MOUTH DAILY  . Multiple Vitamins-Minerals (EYE VITAMINS PO) Take by mouth 2 (two) times daily.   . nitroGLYCERIN (NITROSTAT) 0.4 MG SL tablet place 1  tablet under the tongue if needed every 5 minutes fo (Patient taking differently: place 1 tablet (0.4mg ) under the tongue every 5 minutes as needed for chest pain)  . pantoprazole (PROTONIX) 40 MG tablet TAKE 1 TABLET BY MOUTH EVERYDAY  . traZODone (DESYREL) 50 MG tablet Take 1 tablet by mouth at bedtime.    Allergies:   Terazosin   Social History:  The patient  reports that he has never smoked. He has never used smokeless tobacco. He reports current alcohol use. He reports that he does not use drugs.   Family History:  The patient's family history includes Heart attack in his mother; Hypertension in his mother; Stroke in his mother and another family member.  ROS:   Review of Systems  Constitutional: Positive for malaise/fatigue. Negative for chills, diaphoresis, fever and weight loss.  HENT: Negative for congestion.   Eyes: Negative for discharge and redness.  Respiratory: Negative for cough, hemoptysis, sputum production, shortness of breath and wheezing.   Cardiovascular: Negative for chest pain, palpitations, orthopnea, claudication, leg swelling and PND.  Gastrointestinal: Positive for diarrhea and heartburn. Negative for abdominal pain, blood in stool, melena, nausea and vomiting.  Genitourinary: Positive for dysuria, frequency and urgency. Negative for hematuria.  Musculoskeletal: Negative for falls and myalgias.  Skin: Negative for rash.  Neurological: Negative for dizziness, tingling, tremors, sensory change, speech change, focal weakness, loss of consciousness and weakness.  Endo/Heme/Allergies: Does not bruise/bleed easily.  Psychiatric/Behavioral: Negative for substance abuse. The patient has insomnia. The patient is not nervous/anxious.   All other systems reviewed and are negative.    PHYSICAL EXAM:  VS:  BP (!) 160/82 (BP Location: Left Arm, Patient Position: Sitting, Cuff Size: Normal)   Pulse 61   Ht 5\' 11"  (1.803 m)   Wt 209 lb 12 oz (95.1 kg)   BMI 29.25 kg/m   BMI: Body mass index is 29.25 kg/m.  Physical Exam  Constitutional: He is oriented to person, place, and time. He appears well-developed and well-nourished.  HENT:  Head: Normocephalic and atraumatic.  Eyes: Right eye exhibits no discharge. Left eye exhibits no discharge.  Neck: Normal range of motion. No JVD present.  Cardiovascular: Normal rate, regular rhythm, S1 normal, S2 normal and normal heart sounds. Exam reveals no distant heart sounds, no friction rub, no midsystolic click and no opening snap.  No murmur heard. Pulses:      Posterior tibial pulses are 2+ on the right side and 2+ on the left side.  Pulmonary/Chest: Effort normal and breath sounds normal. No respiratory distress. He has no decreased breath sounds. He has no wheezes. He has no rales. He exhibits no tenderness.  Abdominal: Soft. He exhibits no distension. There is no abdominal tenderness.  Musculoskeletal:        General: No edema.  Neurological: He is alert and oriented to person, place, and time.  Skin: Skin is warm and dry. No cyanosis. Nails show no clubbing.  Psychiatric: He has a normal mood and affect. His speech is normal and behavior is normal. Judgment and thought content normal.     EKG:  Was ordered and interpreted by me today. Shows sinus bradycardia, 58 bpm, left axis deviation, possible prior inferior infarct, no acute st/t changes   Recent Labs: 01/11/2018: TSH 1.970 01/14/2018: B Natriuretic Peptide 17.8 01/26/2018: Magnesium 2.3 06/28/2018: ALT 17; BUN 25; Creatinine, Ser 1.21; Hemoglobin 12.8; Platelets 274; Potassium 3.5; Sodium 138  No results found for requested labs within last 8760 hours.   CrCl cannot be calculated (Patient's most recent lab result is older than the maximum 21 days allowed.).   Wt Readings from Last 3 Encounters:  07/19/18 209 lb 12 oz (95.1 kg)  06/28/18 205 lb (93 kg)  01/26/18 203 lb 8 oz (92.3 kg)     Other studies reviewed: Additional studies/records reviewed  today include: summarized above  ASSESSMENT AND PLAN:  1. HFpEF: He does not appear volume up at this time and has not needed any prn Lasix recently. Continue prn Lasix for weight gain/SOB. CHF education. At risk of volume overload with poorly controlled BP.   2. HTN: Blood pressure is suboptimally controlled, which he attributes to recent URI and recent insomnia. Add losartan 25 mg daily. Continue Toprol XL 12.5  mg daily (heart rate precludes titration of beta blocker). Check BMP in 1 week following initiation of ARB.   3. CAD involving the native coronary arteries without angina: No symptoms concerning for angina. On Eliquis in place of ASA. Continue Toprol and Lipitor. Secondary prevention. No plans for ischemic evaluation at this time.   4. Palpitations: Resolved. Continue Toprol XL. Recent outpatient cardiac monitoring showed no evidence of breakthrough Afib. Patient notes "I feel my heart beat when it is in the 90s."  5. PAF: Maintaining sinus rhythm. Continue Toprol XL and Eliquis. He does not currently meet reduced dosing criteria.   6. HLD: Recent LDL of 55 from 11/2017. Remains on Lipitor.   7. Bradycardia: Improved with decreased dose of Toprol XL to 12.5 mg daily.   8. Insomnia: Has prn trazodone, though has not used any. Likely exacerbating his HTN.   Disposition: F/u with Dr. Fletcher Anon or an APP in 1 month.   Current medicines are reviewed at length with the patient today.  The patient did not have any concerns regarding medicines.  Signed, Christell Faith, PA-C 07/19/2018 9:37 AM     [facility] [address] [place] [address] [place], [place] [address] [phone]

## 2018-07-19 ENCOUNTER — Ambulatory Visit: Payer: PPO | Admitting: Physician Assistant

## 2018-07-19 ENCOUNTER — Encounter: Payer: Self-pay | Admitting: Physician Assistant

## 2018-07-19 VITALS — BP 160/82 | HR 61 | Ht 71.0 in | Wt 209.8 lb

## 2018-07-19 DIAGNOSIS — I2510 Atherosclerotic heart disease of native coronary artery without angina pectoris: Secondary | ICD-10-CM | POA: Diagnosis not present

## 2018-07-19 DIAGNOSIS — G4700 Insomnia, unspecified: Secondary | ICD-10-CM

## 2018-07-19 DIAGNOSIS — R001 Bradycardia, unspecified: Secondary | ICD-10-CM

## 2018-07-19 DIAGNOSIS — I480 Paroxysmal atrial fibrillation: Secondary | ICD-10-CM

## 2018-07-19 DIAGNOSIS — I10 Essential (primary) hypertension: Secondary | ICD-10-CM | POA: Diagnosis not present

## 2018-07-19 DIAGNOSIS — I5032 Chronic diastolic (congestive) heart failure: Secondary | ICD-10-CM

## 2018-07-19 DIAGNOSIS — R002 Palpitations: Secondary | ICD-10-CM | POA: Diagnosis not present

## 2018-07-19 DIAGNOSIS — E785 Hyperlipidemia, unspecified: Secondary | ICD-10-CM

## 2018-07-19 MED ORDER — LOSARTAN POTASSIUM 25 MG PO TABS: 25.0000 mg | ORAL_TABLET | Freq: Every day | ORAL | 3 refills | Status: DC

## 2018-07-19 NOTE — Patient Instructions (Signed)
Medication Instructions:  Your physician has recommended you make the following change in your medication:  1- Losartan Take 1 tablet (25 mg) once daily  If you need a refill on your cardiac medications before your next appointment, please call your pharmacy.   Lab work: Your physician recommends that you return for lab work in: 1 week in the medical mall (BMET)  If you have labs (blood work) drawn today and your tests are completely normal, you will receive your results only by: Marland Kitchen MyChart Message (if you have MyChart) OR . A paper copy in the mail If you have any lab test that is abnormal or we need to change your treatment, we will call you to review the results.  Testing/Procedures: None ordered   Follow-Up: At [facility] you and your health needs are our priority.  As part of our continuing mission to provide you with exceptional heart care, we have created designated Provider Care Teams.  These Care Teams include your primary Cardiologist (physician) and Advanced Practice Providers (APPs -  Physician Assistants and Nurse Practitioners) who all work together to provide you with the care you need, when you need it. You will need a follow up appointment in 1 months.  You may see Dr. Fletcher Anon or one of the following Advanced Practice Providers on your designated Care Team:   Murray Hodgkins, NP Christell Faith, PA-C . Marrianne Mood, PA-C

## 2018-07-29 ENCOUNTER — Other Ambulatory Visit
Admission: RE | Admit: 2018-07-29 | Discharge: 2018-07-29 | Disposition: A | Discharge status: Home / Self Care | Payer: PPO | Source: Ambulatory Visit | Attending: Physician Assistant | Admitting: Physician Assistant

## 2018-07-29 ENCOUNTER — Telehealth: Payer: Self-pay | Admitting: *Deleted

## 2018-07-29 DIAGNOSIS — I10 Essential (primary) hypertension: Secondary | ICD-10-CM

## 2018-07-29 LAB — BASIC METABOLIC PANEL
Anion gap: 6 (ref 5–15)
BUN: 20 mg/dL (ref 8–23)
CO2: 28 mmol/L (ref 22–32)
Calcium: 9.4 mg/dL (ref 8.9–10.3)
Chloride: 106 mmol/L (ref 98–111)
Creatinine, Ser: 1.2 mg/dL (ref 0.61–1.24)
GFR calc Af Amer: >60 mL/min (ref >60)
GFR, EST NON AFRICAN AMERICAN: 55 mL/min — AB (ref >60)
Glucose, Bld: 104 mg/dL — ABNORMAL HIGH (ref 70–99)
Potassium: 4.2 mmol/L (ref 3.5–5.1)
Sodium: 140 mmol/L (ref 135–145)

## 2018-07-29 NOTE — Telephone Encounter (Signed)
[facility] lab called. The patient was there for lab work and needed an order. An order for a BMET has been placed.

## 2018-08-04 DIAGNOSIS — H2512 Age-related nuclear cataract, left eye: Secondary | ICD-10-CM | POA: Diagnosis not present

## 2018-08-04 DIAGNOSIS — H353134 Nonexudative age-related macular degeneration, bilateral, advanced atrophic with subfoveal involvement: Secondary | ICD-10-CM | POA: Diagnosis not present

## 2018-08-04 DIAGNOSIS — H26491 Other secondary cataract, right eye: Secondary | ICD-10-CM | POA: Diagnosis not present

## 2018-08-04 DIAGNOSIS — H52223 Regular astigmatism, bilateral: Secondary | ICD-10-CM | POA: Diagnosis not present

## 2018-08-04 DIAGNOSIS — H5202 Hypermetropia, left eye: Secondary | ICD-10-CM | POA: Diagnosis not present

## 2018-08-04 DIAGNOSIS — H5211 Myopia, right eye: Secondary | ICD-10-CM | POA: Diagnosis not present

## 2018-08-19 NOTE — Progress Notes (Signed)
Cardiology Office Note Date:  08/22/2018  Patient ID:  Brewer, Benjamin 1930/09/22, MRN [id number] PCP:  Derinda Late, MD  Cardiologist:  Dr. Fletcher Anon, MD    Chief Complaint: Follow up  History of Present Illness: MAVERIC Brewer is a 85 y.o. male with history of CAD as outlined below, PAF on Eliquis, HTN, HLD, and anxietywho presents for follow upof hypertension.  Patient underwent PCI/DES to the LAD and RCA in 10-21-2007. Follow up nuclear stress testing in Oct 21, 2011 showed no evidence of ischemia with normal EF. In 10-20-13, he had an episode of Afib with RVR and has been on Eliquis and beta blocker since. Echo in 10/21/2014 showed an EF of 55-60%, mild MR, and a mildly dilated right atrium.In follow up in 08/2017 he was doing well. Patient's wife passed away in 10/20/17 and the patient has dealt with a significant amount of grief, anxiety, and depression since. He was seen in our office on 7/2 with increased ankle edema as well as palpitations and left arm paresthesias. Symptoms were not similar to his prior Afib. There was no chest pain. Weight was noted to be down 2 pounds at 210 from his office visit in 08/2017 when he weighed 212 pounds. He underwent 48-hour Holter monitor that showedNSR with an average heart rate of 65 bpm and range from 43-164 bpm.  The longest pause was 1.64 seconds. Thre were 55 PVCs with an isolated triplet. There were 783 PACs with a rare couplet and 1 run of atrial tach lasating 70 beats. Some of the patient's symptoms corresponded to PACs/PVCs. Labs including TSH were unrevealing. He was started on torsemide. He was seen in the ED on 7/3 with left arm and leg paresthesias with CT head being normal, MRI brain not acute. Cardiac enzyme negative. He presented to [facility] on 7/5 with weakness, left shoulder pain and paresthesias. His lower extremity swelling had nearly resolved with the addition of torsemide. Echo on 7/6 showed an EF of 60-65%, no RWMA, Gr1DD, aortic sclerosis without  stenosis, trivial MR, mildly dilated LA, RVSF normal, trivial TR/PR, PASP normal. CXR normal, left shoulder film normal. BNP 17.8, negative cardiac enzymes. He was mildly bradycardic and his Toprol XL was decreased to 12.5 mg daily. He was also taken off torsemide and placed on prn Lasix 20 mg. He was started in [place] as there was suspicion that anxiety/greif from the loss of his wife could be playing a significant role in his symptoms.  In hospital follow-up on 01/26/2017 he was doing well and continued to grieve the loss of his wife.  His left arm paresthesias and palpitations had resolved.  He was maintaining sinus rhythm and continued on Toprol-XL and Eliquis.     Patient was seen in the [facility] ED on 06/28/2018 for intermittent palpitations.  Patient reported a home heart rate of 100 bpm.  BP noted to be 164/80 with a heart rate of 82 bpm.  EKG showed normal sinus rhythm with nonspecific ST-T changes.  Troponin negative x1.  Potassium 3.5, serum creatinine 1.21, Hgb 12.8.  Chest x-ray with no acute findings.  Outpatient cardiology follow-up was recommended.  He was seen in follow up on 07/19/2018 and was doing well with resolution of his palpitations.  His BP was suboptimally controlled at 160/82.  In this setting, he was started on losartan 25 mg daily and continued on Toprol XL. Follow up labs on 07/29/2018 showed stable renal function and potassium on ARB.   He comes in  doing well today.  Blood pressure has been running in the 387F to 643P systolic over 29J to 18A diastolic.  Palpitations continue to be well controlled.  No chest pain.  No dizziness, presyncope, or syncope.  No falls.  No BRBPR or melena.  His URI has resolved.  He is sleeping better, not needing trazodone.  He has not needed any PRN Lasix.  Weight remains stable.  No orthopnea or early satiety.  He remains in good spirits.  He does not have any concerns today.  Past Medical History:  Diagnosis Date  . Benign prostatic hypertrophy     Hx of   . CAD (coronary artery disease)    native vessel. Status post LAD and RCA angioplasty and drug-eluting stent placement in 2009  . Dyslipidemia   . History of tachycardia   . Hyperlipidemia   . Hypertension   . Intermediate coronary syndrome (HCC)    angina, unstable  . Other dyspnea and respiratory abnormality    on exertion    Past Surgical History:  Procedure Laterality Date  . [place]; x2  . [place]  . ESOPHAGOGASTRODUODENOSCOPY (EGD) WITH PROPOFOL N/A 06/10/2015   Procedure: ESOPHAGOGASTRODUODENOSCOPY (EGD) WITH PROPOFOL;  Surgeon: Manya Silvas, MD;  Location: [facility] ENDOSCOPY;  Service: Endoscopy;  Laterality: N/A;  . HERNIA REPAIR    . SAVORY DILATION N/A 06/10/2015   Procedure: SAVORY DILATION;  Surgeon: Manya Silvas, MD;  Location: [facility] ENDOSCOPY;  Service: Endoscopy;  Laterality: N/A;  . TONSILLECTOMY      Current Meds  Medication Sig  . atorvastatin (LIPITOR) 40 MG tablet TAKE 1 TABLET BY MOUTH EVERY EVENING  . cyanocobalamin 500 MCG tablet Take 500 mcg by mouth daily.  Marland Kitchen ELIQUIS 5 MG TABS tablet TAKE 1 TABLET BY MOUTH TWICE DAILY  . ferrous sulfate (FERRO-BOB) 325 (65 FE) MG tablet Take 325 mg by mouth daily.    . furosemide (LASIX) 20 MG tablet Take 1 tablet (20 mg total) by mouth daily as needed for fluid or edema.  Marland Kitchen losartan (COZAAR) 25 MG tablet Take 1 tablet (25 mg total) by mouth daily.  . metoprolol succinate (TOPROL-XL) 25 MG 24 hr tablet TAKE 1/2 TABLET BY MOUTH DAILY  . Multiple Vitamins-Minerals (EYE VITAMINS PO) Take by mouth 2 (two) times daily.   . nitroGLYCERIN (NITROSTAT) 0.4 MG SL tablet place 1 tablet under the tongue if needed every 5 minutes fo (Patient taking differently: place 1 tablet (0.4mg ) under the tongue every 5 minutes as needed for chest pain)  . pantoprazole (PROTONIX) 40 MG tablet TAKE 1 TABLET BY MOUTH EVERYDAY  .  traZODone (DESYREL) 50 MG tablet Take 1 tablet by mouth at bedtime as needed.     Allergies:   Terazosin   Social History:  The patient  reports that he has never smoked. He has never used smokeless tobacco. He reports current alcohol use. He reports that he does not use drugs.   Family History:  The patient's family history includes Heart attack in his mother; Hypertension in his mother; Stroke in his mother and another family member.  ROS:   Review of Systems  Constitutional: Negative for chills, diaphoresis, fever, malaise/fatigue and weight loss.  HENT: Negative for congestion.   Eyes: Negative for discharge and redness.  Respiratory: Negative for cough, hemoptysis, sputum production, shortness of breath and wheezing.   Cardiovascular:  Negative for chest pain, palpitations, orthopnea, claudication, leg swelling and PND.  Gastrointestinal: Negative for abdominal pain, blood in stool, heartburn, melena, nausea and vomiting.  Genitourinary: Negative for hematuria.  Musculoskeletal: Negative for falls and myalgias.  Skin: Negative for rash.  Neurological: Negative for dizziness, tingling, tremors, sensory change, speech change, focal weakness, loss of consciousness and weakness.  Endo/Heme/Allergies: Does not bruise/bleed easily.  Psychiatric/Behavioral: Negative for substance abuse. The patient is not nervous/anxious.   All other systems reviewed and are negative.    PHYSICAL EXAM:  VS:  BP 128/68 (BP Location: Left Arm, Patient Position: Sitting, Cuff Size: Normal)   Pulse (!) 58   Ht 5\' 11"  (1.803 m)   Wt 211 lb 8 oz (95.9 kg)   BMI 29.50 kg/m  BMI: Body mass index is 29.5 kg/m.  Physical Exam  Constitutional: He is oriented to person, place, and time. He appears well-developed and well-nourished.  HENT:  Head: Normocephalic and atraumatic.  Eyes: Right eye exhibits no discharge. Left eye exhibits no discharge.  Neck: Normal range of motion. No JVD present.    Cardiovascular: Normal rate, regular rhythm, S1 normal, S2 normal and normal heart sounds. Exam reveals no distant heart sounds, no friction rub, no midsystolic click and no opening snap.  No murmur heard. Pulses:      Posterior tibial pulses are 2+ on the right side and 2+ on the left side.  Pulmonary/Chest: Effort normal and breath sounds normal. No respiratory distress. He has no decreased breath sounds. He has no wheezes. He has no rales. He exhibits no tenderness.  Abdominal: Soft. He exhibits no distension. There is no abdominal tenderness.  Musculoskeletal:        General: No edema.  Neurological: He is alert and oriented to person, place, and time.  Skin: Skin is warm and dry. No cyanosis. Nails show no clubbing.  Psychiatric: He has a normal mood and affect. His speech is normal and behavior is normal. Judgment and thought content normal.     EKG:  Was ordered and interpreted by me today. Shows sinus bradycardia, 58 bpm, left axis deviation, possible prior inferior and anterior infarcts, no acute ST-T changes  Recent Labs: 01/11/2018: TSH 1.970 01/14/2018: B Natriuretic Peptide 17.8 01/26/2018: Magnesium 2.3 06/28/2018: ALT 17; Hemoglobin 12.8; Platelets 274 07/29/2018: BUN 20; Creatinine, Ser 1.20; Potassium 4.2; Sodium 140  No results found for requested labs within last 8760 hours.   CrCl cannot be calculated (Patient's most recent lab result is older than the maximum 21 days allowed.).   Wt Readings from Last 3 Encounters:  08/22/18 211 lb 8 oz (95.9 kg)  07/19/18 209 lb 12 oz (95.1 kg)  06/28/18 205 lb (93 kg)     Other studies reviewed: Additional studies/records reviewed today include: summarized above  ASSESSMENT AND PLAN:  1. Hypertension: Blood pressure is much improved following the initiation of losartan 25 mg daily.  Recent follow-up BMP showed stable potassium and renal function on ARB.  Continue losartan and Toprol.  2. CAD involving the native coronary  arteries without angina: He is doing well without any symptoms concerning for angina.  On Eliquis in place of aspirin.  Continue Toprol-XL and Lipitor.  Secondary prevention.  No plans for ischemic evaluation at this time.  3. HFpEF: He appears well compensated and euvolemic.  He has not needed any PRN Lasix.  CHF education and optimal blood pressure control recommended.  Continue as needed Lasix.  4. Palpitations: Much improved on Toprol-XL.  Recent cardiac monitoring showed no evidence of breakthrough A. Fib.  5. PAF: Maintaining sinus rhythm with a mildly bradycardic heart rate.  Continue Toprol-XL and Eliquis.  6. HLD: LDL of 55 from 11/2016.  Remains on Lipitor.  7. Bradycardia: Asymptomatic.  Continue low-dose Toprol 12.5 mg daily.  8. Insomnia: Improved.  Followed by PCP.  Disposition: F/u with Dr. Fletcher Anon or an APP in 6 months.  Current medicines are reviewed at length with the patient today.  The patient did not have any concerns regarding medicines.  Signed, Christell Faith, PA-C 08/22/2018 9:41 AM     [facility] [address] [place] [address] [place], [place] [address] [phone]

## 2018-08-22 ENCOUNTER — Ambulatory Visit (INDEPENDENT_AMBULATORY_CARE_PROVIDER_SITE_OTHER): Payer: PPO | Admitting: Physician Assistant

## 2018-08-22 ENCOUNTER — Encounter: Payer: Self-pay | Admitting: Physician Assistant

## 2018-08-22 VITALS — BP 128/68 | HR 58 | Ht 71.0 in | Wt 211.5 lb

## 2018-08-22 DIAGNOSIS — G4700 Insomnia, unspecified: Secondary | ICD-10-CM | POA: Diagnosis not present

## 2018-08-22 DIAGNOSIS — R002 Palpitations: Secondary | ICD-10-CM | POA: Diagnosis not present

## 2018-08-22 DIAGNOSIS — R001 Bradycardia, unspecified: Secondary | ICD-10-CM

## 2018-08-22 DIAGNOSIS — I5032 Chronic diastolic (congestive) heart failure: Secondary | ICD-10-CM

## 2018-08-22 DIAGNOSIS — I2510 Atherosclerotic heart disease of native coronary artery without angina pectoris: Secondary | ICD-10-CM | POA: Diagnosis not present

## 2018-08-22 DIAGNOSIS — E785 Hyperlipidemia, unspecified: Secondary | ICD-10-CM | POA: Diagnosis not present

## 2018-08-22 DIAGNOSIS — I10 Essential (primary) hypertension: Secondary | ICD-10-CM | POA: Diagnosis not present

## 2018-08-22 DIAGNOSIS — I480 Paroxysmal atrial fibrillation: Secondary | ICD-10-CM | POA: Diagnosis not present

## 2018-08-22 NOTE — Patient Instructions (Addendum)
Medication Instructions:  No changes If you need a refill on your cardiac medications before your next appointment, please call your pharmacy.   Lab work: None ordered  Testing/Procedures: None ordered  Follow-Up: At [facility], you and your health needs are our priority.  As part of our continuing mission to provide you with exceptional heart care, we have created designated Provider Care Teams.  These Care Teams include your primary Cardiologist (physician) and Advanced Practice Providers (APPs -  Physician Assistants and Nurse Practitioners) who all work together to provide you with the care you need, when you need it. You will need a follow up appointment in 6 months.  Please call our office 2 months in advance to schedule this appointment.  You may see Dr. Fletcher Anon or one of the following Advanced Practice Providers on your designated Care Team:   Christell Faith, PA-C   Any Other Special Instructions Will Be Listed Below (If Applicable). Please call the office at [phone] for systolic blood pressure greater than 150 or less than 100.

## 2018-09-22 ENCOUNTER — Other Ambulatory Visit: Payer: Self-pay | Admitting: Physician Assistant

## 2018-10-12 ENCOUNTER — Other Ambulatory Visit: Payer: Self-pay | Admitting: Cardiovascular Disease

## 2018-10-27 DIAGNOSIS — R42 Dizziness and giddiness: Secondary | ICD-10-CM | POA: Diagnosis not present

## 2018-10-28 ENCOUNTER — Other Ambulatory Visit: Payer: Self-pay

## 2018-10-28 ENCOUNTER — Emergency Department: Payer: PPO

## 2018-10-28 ENCOUNTER — Encounter: Payer: Self-pay | Admitting: Emergency Medicine

## 2018-10-28 ENCOUNTER — Emergency Department
Admission: EM | Admit: 2018-10-28 | Discharge: 2018-10-28 | Disposition: A | Discharge status: Home / Self Care | Payer: PPO | Attending: Student in an Organized Health Care Education/Training Program | Admitting: Student in an Organized Health Care Education/Training Program

## 2018-10-28 DIAGNOSIS — M542 Cervicalgia: Secondary | ICD-10-CM | POA: Diagnosis not present

## 2018-10-28 DIAGNOSIS — I10 Essential (primary) hypertension: Secondary | ICD-10-CM | POA: Insufficient documentation

## 2018-10-28 DIAGNOSIS — R202 Paresthesia of skin: Secondary | ICD-10-CM

## 2018-10-28 DIAGNOSIS — Z79899 Other long term (current) drug therapy: Secondary | ICD-10-CM | POA: Diagnosis not present

## 2018-10-28 DIAGNOSIS — Z7901 Long term (current) use of anticoagulants: Secondary | ICD-10-CM | POA: Diagnosis not present

## 2018-10-28 DIAGNOSIS — R42 Dizziness and giddiness: Secondary | ICD-10-CM | POA: Insufficient documentation

## 2018-10-28 DIAGNOSIS — R200 Anesthesia of skin: Secondary | ICD-10-CM | POA: Insufficient documentation

## 2018-10-28 DIAGNOSIS — I2510 Atherosclerotic heart disease of native coronary artery without angina pectoris: Secondary | ICD-10-CM | POA: Insufficient documentation

## 2018-10-28 LAB — CBC WITH DIFFERENTIAL/PLATELET
Abs Immature Granulocytes: 0.03 10*3/uL (ref 0.00–0.07)
Basophils Absolute: 0.1 10*3/uL (ref 0.0–0.1)
Basophils Relative: 1 %
Eosinophils Absolute: 0.4 10*3/uL (ref 0.0–0.5)
Eosinophils Relative: 5 %
HCT: 39.6 % (ref 39.0–52.0)
Hemoglobin: 13.1 g/dL (ref 13.0–17.0)
Immature Granulocytes: 0 %
Lymphocytes Relative: 26 %
Lymphs Abs: 1.8 10*3/uL (ref 0.7–4.0)
MCH: 30.6 pg (ref 26.0–34.0)
MCHC: 33.1 g/dL (ref 30.0–36.0)
MCV: 92.5 fL (ref 80.0–100.0)
Monocytes Absolute: 0.8 10*3/uL (ref 0.1–1.0)
Monocytes Relative: 12 %
Neutro Abs: 4 10*3/uL (ref 1.7–7.7)
Neutrophils Relative %: 56 %
Platelets: 258 10*3/uL (ref 150–400)
RBC: 4.28 MIL/uL (ref 4.22–5.81)
RDW: 13.4 % (ref 11.5–15.5)
WBC: 7.1 10*3/uL (ref 4.0–10.5)
nRBC: 0 % (ref 0.0–0.2)

## 2018-10-28 LAB — URINALYSIS, COMPLETE (UACMP) WITH MICROSCOPIC
Bilirubin Urine: NEGATIVE
Glucose, UA: NEGATIVE mg/dL
Hgb urine dipstick: NEGATIVE
Ketones, ur: NEGATIVE mg/dL
Leukocytes,Ua: NEGATIVE
Nitrite: NEGATIVE
Protein, ur: NEGATIVE mg/dL
Specific Gravity, Urine: 1.005 (ref 1.005–1.030)
Squamous Epithelial / HPF: NONE SEEN (ref 0–5)
pH: 6 (ref 5.0–8.0)

## 2018-10-28 LAB — COMPREHENSIVE METABOLIC PANEL
ALT: 19 U/L (ref 0–44)
AST: 24 U/L (ref 15–41)
Albumin: 4.5 g/dL (ref 3.5–5.0)
Alkaline Phosphatase: 80 U/L (ref 38–126)
Anion gap: 10 (ref 5–15)
BUN: 18 mg/dL (ref 8–23)
CO2: 26 mmol/L (ref 22–32)
Calcium: 9.3 mg/dL (ref 8.9–10.3)
Chloride: 103 mmol/L (ref 98–111)
Creatinine, Ser: 1.35 mg/dL — ABNORMAL HIGH (ref 0.61–1.24)
GFR calc Af Amer: 55 mL/min — ABNORMAL LOW (ref >60)
GFR calc non Af Amer: 48 mL/min — ABNORMAL LOW (ref >60)
Glucose, Bld: 127 mg/dL — ABNORMAL HIGH (ref 70–99)
Potassium: 3.6 mmol/L (ref 3.5–5.1)
Sodium: 139 mmol/L (ref 135–145)
Total Bilirubin: 0.8 mg/dL (ref 0.3–1.2)
Total Protein: 7.5 g/dL (ref 6.5–8.1)

## 2018-10-28 LAB — TROPONIN I: Troponin I: <0.03 ng/mL (ref <0.03)

## 2018-10-28 LAB — MAGNESIUM: Magnesium: 2.3 mg/dL (ref 1.7–2.4)

## 2018-10-28 MED ORDER — SODIUM CHLORIDE 0.9 % IV BOLUS
500.0000 mL | Freq: Once | INTRAVENOUS | Status: AC
  Administered 2018-10-28: 500 mL via INTRAVENOUS

## 2018-10-28 NOTE — ED Notes (Signed)
NAD noted at time of D/C. Pt taken to lobby via wheelchair by this RN. Pt denies comments/concerns regarding D/C instructions.

## 2018-10-28 NOTE — ED Notes (Signed)
Pt taken to MRI  

## 2018-10-28 NOTE — ED Triage Notes (Signed)
Pt to triage via w/c with no distress noted; st dizziness since yesterday; denies hx of same; st has been having frequent urination at night; denies pain; seen at [facility] yesterday and rx "something for dizziness"

## 2018-10-28 NOTE — ED Notes (Signed)
Pt returned from CT at this time.  

## 2018-10-28 NOTE — ED Notes (Signed)
This RN to bedside at this time, introduced self to patient. Pt is alert and oriented at this time, denies dizziness at this time, states when he is lying down he does not feel dizzy, states it is only when he stands up. VSS, pt noted to be hypertensive at this time, awaiting EDP and bloodwork. Pt denies pain at this time.

## 2018-10-28 NOTE — ED Provider Notes (Signed)
[facility] Emergency Department Provider Note    First MD Initiated Contact with Patient 10/28/18 [phone]     (approximate)  I have reviewed the triage vital signs and the nursing notes.   HISTORY  Chief Complaint Dizziness    HPI JULIA KULZER is a 85 y.o. male medical history presents the ER for dizziness.  States the dizziness only occurs when he standing up.  Went to see urgent care yesterday and was given meclizine without any significant improvement.  States the episodes are brief and only occur when he standing up.  Noticed it several times last night when he was getting up to use the bathroom.  States he is having to urinate more frequently.  States he does have some feeling of heaviness on his left side particularly his left leg but states he can feel everything right now.  Denies any headaches.  No chest pain or shortness of breath.  He is on Eliquis and takes a baby aspirin daily.    Past Medical History:  Diagnosis Date  . Benign prostatic hypertrophy    Hx of   . CAD (coronary artery disease)    native vessel. Status post LAD and RCA angioplasty and drug-eluting stent placement in 2009  . Dyslipidemia   . History of tachycardia   . Hyperlipidemia   . Hypertension   . Intermediate coronary syndrome (HCC)    angina, unstable  . Other dyspnea and respiratory abnormality    on exertion   Family History  Problem Relation Age of Onset  . Stroke Mother   . Heart attack Mother   . Hypertension Mother   . Stroke Other        family history   Past Surgical History:  Procedure Laterality Date  . [place]; x2  . [place]  . ESOPHAGOGASTRODUODENOSCOPY (EGD) WITH PROPOFOL N/A 06/10/2015   Procedure: ESOPHAGOGASTRODUODENOSCOPY (EGD) WITH PROPOFOL;  Surgeon: Manya Silvas, MD;  Location[facility] ENDOSCOPY;  Service: Endoscopy;  Laterality: N/A;   . HERNIA REPAIR    . SAVORY DILATION N/A 06/10/2015   Procedure: SAVORY DILATION;  Surgeon: Manya Silvas, MD;  Location[facility] ENDOSCOPY;  Service: Endoscopy;  Laterality: N/A;  . TONSILLECTOMY     Patient Active Problem List   Diagnosis Date Noted  . Acute renal failure superimposed on stage 3 chronic kidney disease ([place]) 01/14/2018  . Left arm numbness 01/14/2018  . Generalized weakness 01/14/2018  . Left shoulder pain   . Atrial fibrillation ([place]) 08/20/2014  . BPPV (benign paroxysmal positional vertigo) 10/23/2013  . Sinus bradycardia 06/16/2013  . Essential hypertension 06/16/2013  . CAD, NATIVE VESSEL 07/03/2009  . DYSPNEA ON EXERTION 12/21/2008  . Hyperlipidemia 12/15/2008  . BENIGN PROSTATIC HYPERTROPHY, HX OF 12/15/2008      Prior to Admission medications   Medication Sig Start Date End Date Taking? Authorizing Provider  atorvastatin (LIPITOR) 40 MG tablet TAKE 1 TABLET BY MOUTH EVERY MORNING 10/12/18  Yes Wellington Hampshire, MD  cyanocobalamin 500 MCG tablet Take 500 mcg by mouth daily.   Yes [provider]  ELIQUIS 5 MG TABS tablet TAKE 1 TABLET BY MOUTH TWICE DAILY 01/31/18  Yes Dunn, Areta Haber, PA-C  ferrous sulfate (FERRO-BOB) 325 (65 FE) MG tablet Take 325 mg by mouth daily.     Yes [provider]  finasteride (PROSCAR) 5  MG tablet Take 5 mg by mouth daily. 09/28/18  Yes [provider]  furosemide (LASIX) 20 MG tablet Take 1 tablet (20 mg total) by mouth daily as needed for fluid or edema. 01/15/18  Yes Elgergawy, Silver Huguenin, MD  losartan (COZAAR) 25 MG tablet Take 1 tablet (25 mg total) by mouth daily. 07/19/18 10/28/18 Yes Dunn, Areta Haber, PA-C  meclizine (ANTIVERT) 12.5 MG tablet Take 12.5 mg by mouth 3 (three) times daily as needed. 10/27/18 10/31/18 Yes [provider]  metoprolol succinate (TOPROL-XL) 25 MG 24 hr tablet TAKE 1/2 TABLET BY MOUTH DAILY 06/29/18  Yes Wellington Hampshire, MD  Multiple Vitamins-Minerals (EYE VITAMINS PO) Take by  mouth 2 (two) times daily.    Yes [provider]  nitroGLYCERIN (NITROSTAT) 0.4 MG SL tablet place 1 tablet under the tongue if needed every 5 minutes fo Patient taking differently: place 1 tablet (0.4mg ) under the tongue every 5 minutes as needed for chest pain 03/29/17  Yes Wellington Hampshire, MD  pantoprazole (PROTONIX) 40 MG tablet TAKE 1 TABLET BY MOUTH DAILY 09/23/18  Yes Wellington Hampshire, MD  traZODone (DESYREL) 50 MG tablet Take 1 tablet by mouth at bedtime as needed.  11/12/17  Yes [provider]    Allergies Terazosin    Social History Social History   Tobacco Use  . Smoking status: Never Smoker  . Smokeless tobacco: Never Used  Substance Use Topics  . Alcohol use: Yes    Comment: occasional   . Drug use: No    Review of Systems Patient denies headaches, rhinorrhea, blurry vision, numbness, shortness of breath, chest pain, edema, cough, abdominal pain, nausea, vomiting, diarrhea, dysuria, fevers, rashes or hallucinations unless otherwise stated above in HPI. ____________________________________________   PHYSICAL EXAM:  VITAL SIGNS: Vitals:   10/28/18 0930 10/28/18 1000  BP: (!) 152/84 139/80  Pulse: (!) 53 (!) 53  Resp: 15 16  SpO2: 99% 96%    Constitutional: Alert and oriented.  Eyes: Conjunctivae are normal.  Head: Atraumatic. Nose: No congestion/rhinnorhea. Mouth/Throat: Mucous membranes are moist.   Neck: No stridor. Painless ROM.  Cardiovascular: Normal rate, regular rhythm. Grossly normal heart sounds.  Good peripheral circulation. Respiratory: Normal respiratory effort.  No retractions. Lungs CTAB. Gastrointestinal: Soft and nontender. No distention. No abdominal bruits. No CVA tenderness. Genitourinary:  Musculoskeletal: No lower extremity tenderness nor edema.  No joint effusions. Neurologic:  CN- intact.  No facial droop, Normal FNF.  Normal heel to shin.  Sensation intact bilaterally. Normal speech and language. No gross focal  neurologic deficits are appreciated. No gait instability.  Skin:  Skin is warm, dry and intact. No rash noted. Psychiatric: Mood and affect are normal. Speech and behavior are normal.  ____________________________________________   LABS (all labs ordered are listed, but only abnormal results are displayed)  Results for orders placed or performed during the hospital encounter of 10/28/18 (from the past 24 hour(s))  CBC with Differential/Platelet     Status: None   Collection Time: 10/28/18  6:34 AM  Result Value Ref Range   WBC 7.1 4.0 - 10.5 K/uL   RBC 4.28 4.22 - 5.81 MIL/uL   Hemoglobin 13.1 13.0 - 17.0 g/dL   HCT 39.6 39.0 - 52.0 %   MCV 92.5 80.0 - 100.0 fL   MCH 30.6 26.0 - 34.0 pg   MCHC 33.1 30.0 - 36.0 g/dL   RDW 13.4 11.5 - 15.5 %   Platelets 258 150 - 400 K/uL   nRBC  0.0 0.0 - 0.2 %   Neutrophils Relative % 56 %   Neutro Abs 4.0 1.7 - 7.7 K/uL   Lymphocytes Relative 26 %   Lymphs Abs 1.8 0.7 - 4.0 K/uL   Monocytes Relative 12 %   Monocytes Absolute 0.8 0.1 - 1.0 K/uL   Eosinophils Relative 5 %   Eosinophils Absolute 0.4 0.0 - 0.5 K/uL   Basophils Relative 1 %   Basophils Absolute 0.1 0.0 - 0.1 K/uL   Immature Granulocytes 0 %   Abs Immature Granulocytes 0.03 0.00 - 0.07 K/uL  Magnesium     Status: None   Collection Time: 10/28/18  6:34 AM  Result Value Ref Range   Magnesium 2.3 1.7 - 2.4 mg/dL  Comprehensive metabolic panel     Status: Abnormal   Collection Time: 10/28/18  6:34 AM  Result Value Ref Range   Sodium 139 135 - 145 mmol/L   Potassium 3.6 3.5 - 5.1 mmol/L   Chloride 103 98 - 111 mmol/L   CO2 26 22 - 32 mmol/L   Glucose, Bld 127 (H) 70 - 99 mg/dL   BUN 18 8 - 23 mg/dL   Creatinine, Ser 1.35 (H) 0.61 - 1.24 mg/dL   Calcium 9.3 8.9 - 10.3 mg/dL   Total Protein 7.5 6.5 - 8.1 g/dL   Albumin 4.5 3.5 - 5.0 g/dL   AST 24 15 - 41 U/L   ALT 19 0 - 44 U/L   Alkaline Phosphatase 80 38 - 126 U/L   Total Bilirubin 0.8 0.3 - 1.2 mg/dL   GFR calc non Af  Amer 48 (L) >60 mL/min   GFR calc Af Amer 55 (L) >60 mL/min   Anion gap 10 5 - 15  Troponin I - ONCE - STAT     Status: None   Collection Time: 10/28/18  6:34 AM  Result Value Ref Range   Troponin I <0.03 <0.03 ng/mL  Urinalysis, Complete w Microscopic     Status: Abnormal   Collection Time: 10/28/18  7:27 AM  Result Value Ref Range   Color, Urine STRAW (A) YELLOW   APPearance CLEAR (A) CLEAR   Specific Gravity, Urine 1.005 1.005 - 1.030   pH 6.0 5.0 - 8.0   Glucose, UA NEGATIVE NEGATIVE mg/dL   Hgb urine dipstick NEGATIVE NEGATIVE   Bilirubin Urine NEGATIVE NEGATIVE   Ketones, ur NEGATIVE NEGATIVE mg/dL   Protein, ur NEGATIVE NEGATIVE mg/dL   Nitrite NEGATIVE NEGATIVE   Leukocytes,Ua NEGATIVE NEGATIVE   RBC / HPF 0-5 0 - 5 RBC/hpf   WBC, UA 0-5 0 - 5 WBC/hpf   Bacteria, UA RARE (A) NONE SEEN   Squamous Epithelial / LPF NONE SEEN 0 - 5   Mucus PRESENT    ____________________________________________  EKG My review and personal interpretation at Time: 6:33   Indication: dizziness  Rate: 80  Rhythm: sinus Axis: normal Other: normal intervals, no stemi ____________________________________________  RADIOLOGY  I personally reviewed all radiographic images ordered to evaluate for the above acute complaints and reviewed radiology reports and findings.  These findings were personally discussed with the patient.  Please see medical record for radiology report.  ____________________________________________   PROCEDURES  Procedure(s) performed:  Procedures    Critical Care performed: no ____________________________________________   INITIAL IMPRESSION / ASSESSMENT AND PLAN / ED COURSE  Pertinent labs & imaging results that were available during my care of the patient were reviewed by me and considered in my medical decision making (see chart for  details).   DDX: dehydration, vertigo, cva, tia, mass, electrolyte abn, uti  ELIS SAUBER is a 85 y.o. who presents to the  ED with symptoms as described above.  Symptoms of the left upper extremity and leg tingling seems to been on and off for the past several weeks and even months.  He has no facial symptoms.  His neuro exam is nonfocal and without any objective deficits.  Dizziness may be secondary to dehydration versus possible vertigo.  Blood will be sent for the by differential.  Clinical Course as of Oct 28 1202  Fri Oct 28, 2018  0805 Blood work and CT imaging are reassuring.  Will give gentle IV hydration.  Will consult with neurology regarding appropriate further diagnostics at this time.   [PR]  8413 Discussed case in consultation with Dr. Leia Alf of neurology.  Plan will be to obtain MRI brain and cervical spine.  Is a patient is already on Eliquis with symptoms on since last night will evaluate MRI brain.  May also be having component of radiculopathy causing symptoms as he has no focal deficits or objective deficits on exam.  Assuming MRI is negative patient will be stable and appropriate for discharge home for close outpatient follow-up.  If MRI is positive will discuss with hospitalist for admission.   [PR]  1152 MRI brain is normal.  Patient does have significant foraminal stenosis particular on the left which would explain the patient's paresthesias.  He has no motor deficit.  He is otherwise hemodynamically stable and appropriate for discharge home.  We will give referral to neurosurgery.   [PR]  1158 Discussed these results with the patient.  He is able to ambulate with a steady gait.  Symptoms improved with some IV hydration.  Possible component of dehydration.  Discussed increasing oral hydration for the next 2 days with outpatient follow-up.  Have discussed with the patient and available family all diagnostics and treatments performed thus far and all questions were answered to the best of my ability. The patient demonstrates understanding and agreement with plan.    [PR]    Clinical Course User Index  [PR] Merlyn Lot, MD    The patient was evaluated in Emergency Department today for the symptoms described in the history of present illness. He/she was evaluated in the context of the global COVID-19 pandemic, which necessitated consideration that the patient might be at risk for infection with the SARS-CoV-2 virus that causes COVID-19. Institutional protocols and algorithms that pertain to the evaluation of patients at risk for COVID-19 are in a state of rapid change based on information released by regulatory bodies including the CDC and federal and state organizations. These policies and algorithms were followed during the patient's care in the ED.  As part of my medical decision making, I reviewed the following data within the [place] notes reviewed and incorporated, Labs reviewed, notes from prior ED visits and [place] Controlled Substance Database   ____________________________________________   FINAL CLINICAL IMPRESSION(S) / ED DIAGNOSES  Final diagnoses:  Dizziness  Numbness and tingling of left arm and leg      NEW MEDICATIONS STARTED DURING THIS VISIT:  New Prescriptions   No medications on file     Note:  This document was prepared using Dragon voice recognition software and may include unintentional dictation errors.    Merlyn Lot, MD 10/28/18 1204

## 2018-10-28 NOTE — ED Notes (Signed)
Pt given phone for MRI screening. Remains A&O x4, NAD noted at this time. VSS. Will continue to monitor for further patient needs.

## 2018-10-28 NOTE — ED Notes (Signed)
Pt given water per MD permission. Pt stood with this RN and MD at bedside, pt tolerated well.

## 2018-10-28 NOTE — Discharge Instructions (Addendum)
Please follow-up with PCP.  I have also given you referral to neurosurgery regarding "pinched nerves" in her neck which is likely causing the tingling sensation and your arm.  Please return to the ER if you develop any weakness or worsening symptoms.

## 2018-10-30 ENCOUNTER — Encounter (HOSPITAL_COMMUNITY): Payer: Self-pay | Admitting: Emergency Medicine

## 2018-10-30 ENCOUNTER — Emergency Department (HOSPITAL_COMMUNITY)
Admission: EM | Admit: 2018-10-30 | Discharge: 2018-10-30 | Disposition: A | Discharge status: Home / Self Care | Payer: PPO | Attending: Emergency Medicine | Admitting: Emergency Medicine

## 2018-10-30 ENCOUNTER — Other Ambulatory Visit: Payer: Self-pay

## 2018-10-30 DIAGNOSIS — M4802 Spinal stenosis, cervical region: Secondary | ICD-10-CM | POA: Diagnosis not present

## 2018-10-30 DIAGNOSIS — M5136 Other intervertebral disc degeneration, lumbar region: Secondary | ICD-10-CM | POA: Diagnosis not present

## 2018-10-30 DIAGNOSIS — I10 Essential (primary) hypertension: Secondary | ICD-10-CM | POA: Insufficient documentation

## 2018-10-30 DIAGNOSIS — M5030 Other cervical disc degeneration, unspecified cervical region: Secondary | ICD-10-CM

## 2018-10-30 DIAGNOSIS — Z7901 Long term (current) use of anticoagulants: Secondary | ICD-10-CM | POA: Diagnosis not present

## 2018-10-30 DIAGNOSIS — R202 Paresthesia of skin: Secondary | ICD-10-CM | POA: Diagnosis not present

## 2018-10-30 DIAGNOSIS — I2510 Atherosclerotic heart disease of native coronary artery without angina pectoris: Secondary | ICD-10-CM | POA: Diagnosis not present

## 2018-10-30 DIAGNOSIS — Z79899 Other long term (current) drug therapy: Secondary | ICD-10-CM | POA: Diagnosis not present

## 2018-10-30 MED ORDER — ACETAMINOPHEN 325 MG PO TABS
650.0000 mg | ORAL_TABLET | Freq: Once | ORAL | Status: AC
  Administered 2018-10-30: 15:00:00 650 mg via ORAL
  Filled 2018-10-30: qty 2

## 2018-10-30 MED ORDER — PREDNISONE 20 MG PO TABS: ORAL_TABLET | ORAL | 0 refills | Status: DC

## 2018-10-30 MED ORDER — PREDNISONE 20 MG PO TABS
60.0000 mg | ORAL_TABLET | Freq: Once | ORAL | Status: AC
  Administered 2018-10-30: 60 mg via ORAL
  Filled 2018-10-30: qty 3

## 2018-10-30 NOTE — ED Provider Notes (Signed)
[facility] EMERGENCY DEPARTMENT Provider Note   CSN: [id number] Arrival date & time: 10/30/18  1158    History   Chief Complaint Chief Complaint  Patient presents with   stroke like symptoms    HPI Benjamin Brewer is a 85 y.o. male.     Patient c/o left sided, arm and leg, intermittently feeling numb/tingly for the past few days. Symptoms mild, persistent, recurrent, presents intermittently x months, but more prevalent for past couple days. States went to ED 2 days ago w c/o dizziness, and at that time mentioned the left sided tingly/numbness sensation. The dizziness has resolved, but notes the tingling on left sided is more persistent. Symptoms mild, persistent. No new weakness or loss of normal functional ability. No change in balance or coordination. No change in speech. States vision seemed diffusely, mildly blurry earlier today. No amaurosis or visual field cut. Pt notes long hx sciatica, and states the left leg numbness sensation may be due to that - states was told on ED visit 2 days ago possible nerve impingement in neck as well. Denies new/radicular pain. No weakness. No neck pain.  The history is provided by the patient.    Past Medical History:  Diagnosis Date   Benign prostatic hypertrophy    Hx of    CAD (coronary artery disease)    native vessel. Status post LAD and RCA angioplasty and drug-eluting stent placement in 2009   Dyslipidemia    History of tachycardia    Hyperlipidemia    Hypertension    Intermediate coronary syndrome (HCC)    angina, unstable   Other dyspnea and respiratory abnormality    on exertion    Patient Active Problem List   Diagnosis Date Noted   Acute renal failure superimposed on stage 3 chronic kidney disease ([place]) 01/14/2018   Left arm numbness 01/14/2018   Generalized weakness 01/14/2018   Left shoulder pain    Atrial fibrillation ([place]) 08/20/2014   BPPV (benign paroxysmal positional vertigo)  10/23/2013   Sinus bradycardia 06/16/2013   Essential hypertension 06/16/2013   CAD, NATIVE VESSEL 07/03/2009   DYSPNEA ON EXERTION 12/21/2008   Hyperlipidemia 12/15/2008   BENIGN PROSTATIC HYPERTROPHY, HX OF 12/15/2008    Past Surgical History:  Procedure Laterality Date   [place]; x2   [place]   ESOPHAGOGASTRODUODENOSCOPY (EGD) WITH PROPOFOL N/A 06/10/2015   Procedure: ESOPHAGOGASTRODUODENOSCOPY (EGD) WITH PROPOFOL;  Surgeon: Manya Silvas, MD;  Location: [facility];  Service: Endoscopy;  Laterality: N/A;   HERNIA REPAIR     SAVORY DILATION N/A 06/10/2015   Procedure: SAVORY DILATION;  Surgeon: Manya Silvas, MD;  Location: [facility] ENDOSCOPY;  Service: Endoscopy;  Laterality: N/A;   TONSILLECTOMY          Home Medications    Prior to Admission medications   Medication Sig Start Date End Date Taking? Authorizing Provider  atorvastatin (LIPITOR) 40 MG tablet TAKE 1 TABLET BY MOUTH EVERY MORNING 10/12/18   Wellington Hampshire, MD  cyanocobalamin 500 MCG tablet Take 500 mcg by mouth daily.    [provider]  ELIQUIS 5 MG TABS tablet TAKE 1 TABLET BY MOUTH TWICE DAILY 01/31/18   Rise Mu, PA-C  ferrous sulfate (FERRO-BOB) 325 (65 FE) MG tablet Take 325 mg by mouth daily.      [provider]  finasteride (PROSCAR) 5  MG tablet Take 5 mg by mouth daily. 09/28/18   [provider]  furosemide (LASIX) 20 MG tablet Take 1 tablet (20 mg total) by mouth daily as needed for fluid or edema. 01/15/18   Elgergawy, Silver Huguenin, MD  losartan (COZAAR) 25 MG tablet Take 1 tablet (25 mg total) by mouth daily. 07/19/18 10/28/18  Rise Mu, PA-C  meclizine (ANTIVERT) 12.5 MG tablet Take 12.5 mg by mouth 3 (three) times daily as needed. 10/27/18 10/31/18  [provider]  metoprolol succinate (TOPROL-XL) 25 MG 24 hr tablet TAKE 1/2 TABLET BY MOUTH DAILY  06/29/18   Wellington Hampshire, MD  Multiple Vitamins-Minerals (EYE VITAMINS PO) Take by mouth 2 (two) times daily.     [provider]  nitroGLYCERIN (NITROSTAT) 0.4 MG SL tablet place 1 tablet under the tongue if needed every 5 minutes fo Patient taking differently: place 1 tablet (0.4mg ) under the tongue every 5 minutes as needed for chest pain 03/29/17   Wellington Hampshire, MD  pantoprazole (PROTONIX) 40 MG tablet TAKE 1 TABLET BY MOUTH DAILY 09/23/18   Wellington Hampshire, MD  traZODone (DESYREL) 50 MG tablet Take 1 tablet by mouth at bedtime as needed.  11/12/17   [provider]    Family History Family History  Problem Relation Age of Onset   Stroke Mother    Heart attack Mother    Hypertension Mother    Stroke Other        family history    Social History Social History   Tobacco Use   Smoking status: Never Smoker   Smokeless tobacco: Never Used  Substance Use Topics   Alcohol use: Yes    Comment: occasional    Drug use: No     Allergies   Terazosin   Review of Systems Review of Systems  Constitutional: Negative for fever.  HENT: Negative for sore throat and trouble swallowing.   Eyes: Negative for pain.  Respiratory: Negative for cough and shortness of breath.   Cardiovascular: Negative for chest pain.  Gastrointestinal: Negative for abdominal pain and vomiting.  Genitourinary: Negative for flank pain.  Musculoskeletal: Negative for back pain and neck pain.  Skin: Negative for rash.  Neurological: Positive for numbness. Negative for speech difficulty, weakness and headaches.  Hematological: Does not bruise/bleed easily.  Psychiatric/Behavioral: Negative for confusion.     Physical Exam Updated Vital Signs BP (!) 151/94 (BP Location: Right Arm)    Pulse 68    Temp 98.6 F (37 C) (Oral)    Resp 16    SpO2 98%   Physical Exam Vitals signs and nursing note reviewed.  Constitutional:      Appearance: Normal appearance. He is  well-developed.  HENT:     Head: Atraumatic.     Nose: Nose normal.     Mouth/Throat:     Mouth: Mucous membranes are moist.     Pharynx: Oropharynx is clear.  Eyes:     General: No scleral icterus.    Extraocular Movements: Extraocular movements intact.     Conjunctiva/sclera: Conjunctivae normal.     Pupils: Pupils are equal, round, and reactive to light.  Neck:     Musculoskeletal: Normal range of motion and neck supple. No neck rigidity.     Vascular: No carotid bruit.     Trachea: No tracheal deviation.  Cardiovascular:     Rate and Rhythm: Normal rate and regular rhythm.     Pulses: Normal pulses.  Heart sounds: Normal heart sounds. No murmur. No friction rub. No gallop.   Pulmonary:     Effort: Pulmonary effort is normal. No accessory muscle usage or respiratory distress.     Breath sounds: Normal breath sounds.  Abdominal:     General: Bowel sounds are normal. There is no distension.     Palpations: Abdomen is soft.     Tenderness: There is no abdominal tenderness. There is no guarding.  Genitourinary:    Comments: No cva tenderness. Musculoskeletal:        General: No swelling.     Right lower leg: No edema.     Left lower leg: No edema.  Skin:    General: Skin is warm and dry.     Findings: No rash.  Neurological:     Mental Status: He is alert.     Cranial Nerves: No cranial nerve deficit.     Comments: Alert, speech clear/fluent. Motor intact bil, stre 5/5. No pronator drift. Sensation grossly intact bil. Steady gait, no ataxia.   Psychiatric:        Mood and Affect: Mood normal.      ED Treatments / Results  Labs (all labs ordered are listed, but only abnormal results are displayed) Results for orders placed or performed during the hospital encounter of 10/28/18  CBC with Differential/Platelet  Result Value Ref Range   WBC 7.1 4.0 - 10.5 K/uL   RBC 4.28 4.22 - 5.81 MIL/uL   Hemoglobin 13.1 13.0 - 17.0 g/dL   HCT 39.6 39.0 - 52.0 %   MCV 92.5  80.0 - 100.0 fL   MCH 30.6 26.0 - 34.0 pg   MCHC 33.1 30.0 - 36.0 g/dL   RDW 13.4 11.5 - 15.5 %   Platelets 258 150 - 400 K/uL   nRBC 0.0 0.0 - 0.2 %   Neutrophils Relative % 56 %   Neutro Abs 4.0 1.7 - 7.7 K/uL   Lymphocytes Relative 26 %   Lymphs Abs 1.8 0.7 - 4.0 K/uL   Monocytes Relative 12 %   Monocytes Absolute 0.8 0.1 - 1.0 K/uL   Eosinophils Relative 5 %   Eosinophils Absolute 0.4 0.0 - 0.5 K/uL   Basophils Relative 1 %   Basophils Absolute 0.1 0.0 - 0.1 K/uL   Immature Granulocytes 0 %   Abs Immature Granulocytes 0.03 0.00 - 0.07 K/uL  Magnesium  Result Value Ref Range   Magnesium 2.3 1.7 - 2.4 mg/dL  Comprehensive metabolic panel  Result Value Ref Range   Sodium 139 135 - 145 mmol/L   Potassium 3.6 3.5 - 5.1 mmol/L   Chloride 103 98 - 111 mmol/L   CO2 26 22 - 32 mmol/L   Glucose, Bld 127 (H) 70 - 99 mg/dL   BUN 18 8 - 23 mg/dL   Creatinine, Ser 1.35 (H) 0.61 - 1.24 mg/dL   Calcium 9.3 8.9 - 10.3 mg/dL   Total Protein 7.5 6.5 - 8.1 g/dL   Albumin 4.5 3.5 - 5.0 g/dL   AST 24 15 - 41 U/L   ALT 19 0 - 44 U/L   Alkaline Phosphatase 80 38 - 126 U/L   Total Bilirubin 0.8 0.3 - 1.2 mg/dL   GFR calc non Af Amer 48 (L) >60 mL/min   GFR calc Af Amer 55 (L) >60 mL/min   Anion gap 10 5 - 15  Troponin I - ONCE - STAT  Result Value Ref Range   Troponin I <0.03 <0.03 ng/mL  Urinalysis, Complete w Microscopic  Result Value Ref Range   Color, Urine STRAW (A) YELLOW   APPearance CLEAR (A) CLEAR   Specific Gravity, Urine 1.005 1.005 - 1.030   pH 6.0 5.0 - 8.0   Glucose, UA NEGATIVE NEGATIVE mg/dL   Hgb urine dipstick NEGATIVE NEGATIVE   Bilirubin Urine NEGATIVE NEGATIVE   Ketones, ur NEGATIVE NEGATIVE mg/dL   Protein, ur NEGATIVE NEGATIVE mg/dL   Nitrite NEGATIVE NEGATIVE   Leukocytes,Ua NEGATIVE NEGATIVE   RBC / HPF 0-5 0 - 5 RBC/hpf   WBC, UA 0-5 0 - 5 WBC/hpf   Bacteria, UA RARE (A) NONE SEEN   Squamous Epithelial / LPF NONE SEEN 0 - 5   Mucus PRESENT    Ct Head  Wo Contrast  Result Date: 10/28/2018 CLINICAL DATA:  Ataxia.  Stroke suspected.  Vertigo.  Episodic. EXAM: CT HEAD WITHOUT CONTRAST TECHNIQUE: Contiguous axial images were obtained from the base of the skull through the vertex without intravenous contrast. COMPARISON:  MRI brain 01/12/2018 FINDINGS: Brain: Mild atrophy and white matter changes are stable. No acute infarct, hemorrhage, or mass lesion is present. The ventricles are of proportionate to the degree of atrophy. No significant extraaxial fluid collection is present. The brainstem and cerebellum are within normal limits. Vascular: Atherosclerotic changes are present within the cavernous internal carotid arteries bilaterally. There is no hyperdense vessel. Skull: Calvarium is intact. No focal lytic or blastic lesions are present. Sinuses/Orbits: The paranasal sinuses and mastoid air cells are clear. The globes and orbits are within normal limits. IMPRESSION: 1. No acute intracranial abnormality to explain the patient's new onset dizziness. 2. Stable atrophy and white matter disease. 3. Atherosclerosis Electronically Signed   By: San Morelle M.D.   On: 10/28/2018 07:29   Mr Brain Wo Contrast  Result Date: 10/28/2018 CLINICAL DATA:  Focal neuro deficit for greater than 6 hours. Dizziness beginning yesterday. EXAM: MRI HEAD WITHOUT CONTRAST TECHNIQUE: Multiplanar, multiecho pulse sequences of the brain and surrounding structures were obtained without intravenous contrast. COMPARISON:  CT head without contrast 10/28/2018 and MRI brain 01/12/2018 FINDINGS: Brain: The diffusion-weighted images demonstrate no acute or subacute infarction. Mild atrophy and white matter changes are similar to the prior study. No acute hemorrhage or mass lesion is present. The ventricles are of proportionate to the degree of atrophy. The internal auditory canals are within normal limits. The brainstem and cerebellum are within normal limits. Vascular: Flow is present in  the major intracranial arteries. Skull and upper cervical spine: The craniocervical junction is normal. Upper cervical spine is within normal limits. Marrow signal is unremarkable. Sinuses/Orbits: The paranasal sinuses and mastoid air cells are clear. A right lens replacement is noted. Globes and orbits are otherwise within normal limits. IMPRESSION: 1. Normal MRI of the brain for age. No acute abnormality or significant change from previous studies. 2. No acute or focal abnormality to explain dizziness. Electronically Signed   By: San Morelle M.D.   On: 10/28/2018 11:49   Mr Cervical Spine Wo Contrast  Result Date: 10/28/2018 CLINICAL DATA:  Neck pain, chronic, abnormal neuro exam. Seen at urgent care yesterday for dizziness. EXAM: MRI CERVICAL SPINE WITHOUT CONTRAST TECHNIQUE: Multiplanar, multisequence MR imaging of the cervical spine was performed. No intravenous contrast was administered. COMPARISON:  None. FINDINGS: Alignment: Slight retrolisthesis is present at C4-5. Slight anterolisthesis is present at C5-6. AP alignment is otherwise anatomic. Vertebrae: Chronic endplate changes are most noted at C1-2, C3-4, C4-5, and C5-6. Marrow signal and vertebral body  heights are otherwise normal. Cord: Normal signal is present in the cervical and upper thoracic spinal cord to the lowest imaged level, T2-3. Posterior Fossa, vertebral arteries, paraspinal tissues: The craniocervical junction is normal. Visualized intracranial contents are normal. Flow is present in the vertebral arteries bilaterally. Disc levels: C1-2: Negative C2-3: Asymmetric left-sided uncovertebral and facet disease leads to moderate left and mild right foraminal narrowing. C3-4: Left posterior elements are fused. There is probable fusion across the disc space is well. A prominent disc osteophyte complex is present. There is partial effacement of the ventral CSF. Moderate foraminal narrowing is present bilaterally, left greater than  right. C4-5: A broad-based disc osteophyte complex is present. Uncovertebral spurring contributes to severe foraminal narrowing bilaterally. Facet hypertrophy is worse on the right. C5-6: A broad-based disc osteophyte complex is present. Moderate foraminal narrowing is present bilaterally due to uncovertebral and facet disease. C6-7: A left paramedian disc protrusion effaces the ventral CSF and likely contacts the cord. Moderate foraminal narrowing is worse on the right. C7-T1: Negative. IMPRESSION: 1. Multilevel spondylosis of the cervical spine as described. 2. No acute or focal abnormality to explain dizziness. 3. Acquired fusion of posterior elements on the left at C3-4 and likely across the disc space. 4. Mild central and moderate bilateral foraminal narrowing at C3-4 is worse on the left. 5. Severe foraminal narrowing bilaterally at C4-5 is worse on the left. 6. Moderate foraminal narrowing at C5-6 and C6-7. 7. Left paramedian soft disc protrusion at C6-7 with moderate left central canal stenosis. Electronically Signed   By: San Morelle M.D.   On: 10/28/2018 11:36    EKG EKG Interpretation  Date/Time:  Sunday October 30 2018 12:14:17 EDT Ventricular Rate:  67 PR Interval:    QRS Duration: 107 QT Interval:  416 QTC Calculation: 440 R Axis:   -16 Text Interpretation:  Sinus rhythm No significant change since last tracing Confirmed by Lajean Saver [phone]) on 10/30/2018 12:21:22 PM   Radiology No results found.  Procedures Procedures (including critical care time)  Medications Ordered in ED Medications  predniSONE (DELTASONE) tablet 60 mg (has no administration in time range)     Initial Impression / Assessment and Plan / ED Course  I have reviewed the triage vital signs and the nursing notes.  Pertinent labs & imaging results that were available during my care of the patient were reviewed by me and considered in my medical decision making (see chart for  details).   Reviewed nursing notes and prior charts for additional history.  2 days ago had workup at [facility] for similar symptoms including MRI brain - normal, and MRI c spine showing foraminal stenosis, disc protrusion with moderate left canal stenosis.   No weakness on exam or other focal deficit noted on neuro exam. No headache. No fevers.   Discussed with neurology who indicated would not recommend repeating imaging studies, possible steroid tx and ns outpt f/u.   Prednisone po.   Discussed recent results w pt.   Rec close outpt f/u/NS.  Return precautions provided.       Final Clinical Impressions(s) / ED Diagnoses   Final diagnoses:  None    ED Discharge Orders    None       Lajean Saver, MD 10/31/18 [phone]

## 2018-10-30 NOTE — ED Notes (Signed)
Pt states he understands in structions. Home stable via wc. 

## 2018-10-30 NOTE — Discharge Instructions (Addendum)
It was our pleasure to provide your ER care today - we hope that you feel better.  Your recent MRI shows cervical degenerative disc disease, disc protrusion, spinal stenosis, and foraminal stenosis.  Your recent brain MRI showed no acute stroke.   Take prednisone as prescribed.   Follow up with neurosurgeon in the next 1-2 weeks - call office Monday to arrange appointment.   Return to ER if worse, new symptoms, extremity weakness, change in speech, loss of function, or other concern.

## 2018-10-30 NOTE — ED Notes (Signed)
ED Provider at bedside. 

## 2018-10-30 NOTE — ED Triage Notes (Signed)
Pt here for continued stroke like symptoms from yesterday. States vision is blurry and left side is numb. Pt seen [place] yesterday for same and had negative stroke workup.

## 2018-10-30 NOTE — ED Notes (Signed)
Pt State he has had tingling at left lower extremities and bilateral lower extremity weakness.

## 2018-11-01 ENCOUNTER — Telehealth: Payer: Self-pay | Admitting: Cardiovascular Disease

## 2018-11-01 NOTE — Telephone Encounter (Signed)
I spoke with the patient. He was in the ER on 4/17 & 4/19 with complaints of dizziness & numbness. I advised I was calling to make sure his symptoms were no worse since his last ER visit on 4/19. The patient states he is no worse, just feels "not right."  I advised I will let Dr. Fletcher Anon evaluate further at his scheduled e-visit tomorrow.  The patient voices understanding and is agreeable.

## 2018-11-01 NOTE — Telephone Encounter (Signed)
Patient c/o Palpitations:  High priority if patient c/o lightheadedness, shortness of breath, or chest pain  1) How long have you had palpitations/irregular HR/ Afib? Are you having the symptoms now? 1 week yes  2) Are you currently experiencing lightheadedness, SOB or CP? Weakness fatigue a little sob little lightheaded slight angina   3) Do you have a history of afib (atrial fibrillation) or irregular heart rhythm? Yes   4) Have you checked your BP or HR? (document readings if available): jumps around but normal   5) Are you experiencing any other symptoms?  Numbness Left arm leg and both feet s/p ED visit see Epic   Scheduled with [facility] 4/22

## 2018-11-01 NOTE — Telephone Encounter (Signed)
Virtual Visit Pre-Appointment Phone Call  "(Name), I am calling you today to discuss your upcoming appointment. We are currently trying to limit exposure to the virus that causes COVID-19 by seeing patients at home rather than in the office."  1. "What is the BEST phone number to call the day of the visit?" - include this in appointment notes  2. Do you have or have access to (through a family member/friend) a smartphone with video capability that we can use for your visit?" a. If yes - list this number in appt notes as cell (if different from BEST phone #) and list the appointment type as a VIDEO visit in appointment notes b. If no - list the appointment type as a PHONE visit in appointment notes  3. Confirm consent - "In the setting of the current Covid19 crisis, you are scheduled for a (phone or video) visit with your provider on (date) at (time).  Just as we do with many in-office visits, in order for you to participate in this visit, we must obtain consent.  If you'd like, I can send this to your mychart (if signed up) or email for you to review.  Otherwise, I can obtain your verbal consent now.  All virtual visits are billed to your insurance company just like a normal visit would be.  By agreeing to a virtual visit, we'd like you to understand that the technology does not allow for your provider to perform an examination, and thus may limit your provider's ability to fully assess your condition. If your provider identifies any concerns that need to be evaluated in person, we will make arrangements to do so.  Finally, though the technology is pretty good, we cannot assure that it will always work on either your or our end, and in the setting of a video visit, we may have to convert it to a phone-only visit.  In either situation, we cannot ensure that we have a secure connection.  Are you willing to proceed?" STAFF: Did the patient verbally acknowledge consent to telehealth visit? Document  YES/NO here: yes  4. Advise patient to be prepared - "Two hours prior to your appointment, go ahead and check your blood pressure, pulse, oxygen saturation, and your weight (if you have the equipment to check those) and write them all down. When your visit starts, your provider will ask you for this information. If you have an Apple Watch or Kardia device, please plan to have heart rate information ready on the day of your appointment. Please have a pen and paper handy nearby the day of the visit as well."  5. Give patient instructions for MyChart download to smartphone OR Doximity/Doxy.me as below if video visit (depending on what platform provider is using)  6. Inform patient they will receive a phone call 15 minutes prior to their appointment time (may be from unknown caller ID) so they should be prepared to answer    TELEPHONE CALL NOTE  Benjamin Brewer has been deemed a candidate for a follow-up tele-health visit to limit community exposure during the Covid-19 pandemic. I spoke with the patient via phone to ensure availability of phone/video source, confirm preferred email & phone number, and discuss instructions and expectations.  I reminded Benjamin Brewer to be prepared with any vital sign and/or heart rhythm information that could potentially be obtained via home monitoring, at the time of his visit. I reminded Benjamin Brewer to expect a phone call prior to  his visit.  Clarisse Gouge 11/01/2018 3:52 PM   INSTRUCTIONS FOR DOWNLOADING THE MYCHART APP TO SMARTPHONE  - The patient must first make sure to have activated MyChart and know their login information - If Apple, go to [employer] and type in MyChart in the search bar and download the app. If Android, ask patient to go to [employer] and type in [place] in the search bar and download the app. The app is free but as with any other app downloads, their phone may require them to verify saved payment information or  Apple/Android password.  - The patient will need to then log into the app with their MyChart username and password, and select [facility] as their healthcare provider to link the account. When it is time for your visit, go to the MyChart app, find appointments, and click Begin Video Visit. Be sure to Select Allow for your device to access the Microphone and Camera for your visit. You will then be connected, and your provider will be with you shortly.  **If they have any issues connecting, or need assistance please contact MyChart service desk [phone])**  **If using a computer, in order to ensure the best quality for their visit they will need to use either of the following Internet Browsers: [employer], or Google Chrome**  IF USING DOXIMITY or DOXY.ME - The patient will receive a link just prior to their visit by text.     FULL LENGTH CONSENT FOR TELE-HEALTH VISIT   I hereby voluntarily request, consent and authorize [facility] and its employed or contracted physicians, physician assistants, nurse practitioners or other licensed health care professionals (the Practitioner), to provide me with telemedicine health care services (the Services") as deemed necessary by the treating Practitioner. I acknowledge and consent to receive the Services by the Practitioner via telemedicine. I understand that the telemedicine visit will involve communicating with the Practitioner through live audiovisual communication technology and the disclosure of certain medical information by electronic transmission. I acknowledge that I have been given the opportunity to request an in-person assessment or other available alternative prior to the telemedicine visit and am voluntarily participating in the telemedicine visit.  I understand that I have the right to withhold or withdraw my consent to the use of telemedicine in the course of my care at any time, without affecting my right to future care  or treatment, and that the Practitioner or I may terminate the telemedicine visit at any time. I understand that I have the right to inspect all information obtained and/or recorded in the course of the telemedicine visit and may receive copies of available information for a reasonable fee.  I understand that some of the potential risks of receiving the Services via telemedicine include:   Delay or interruption in medical evaluation due to technological equipment failure or disruption;  Information transmitted may not be sufficient (e.g. poor resolution of images) to allow for appropriate medical decision making by the Practitioner; and/or   In rare instances, security protocols could fail, causing a breach of personal health information.  Furthermore, I acknowledge that it is my responsibility to provide information about my medical history, conditions and care that is complete and accurate to the best of my ability. I acknowledge that Practitioner's advice, recommendations, and/or decision may be based on factors not within their control, such as incomplete or inaccurate data provided by me or distortions of diagnostic images or specimens that may result from electronic transmissions. I  understand that the practice of medicine is not an exact science and that Practitioner makes no warranties or guarantees regarding treatment outcomes. I acknowledge that I will receive a copy of this consent concurrently upon execution via email to the email address I last provided but may also request a printed copy by calling the office of [facility].    I understand that my insurance will be billed for this visit.   I have read or had this consent read to me.  I understand the contents of this consent, which adequately explains the benefits and risks of the Services being provided via telemedicine.   I have been provided ample opportunity to ask questions regarding this consent and the Services and have had  my questions answered to my satisfaction.  I give my informed consent for the services to be provided through the use of telemedicine in my medical care  By participating in this telemedicine visit I agree to the above.

## 2018-11-02 ENCOUNTER — Other Ambulatory Visit: Payer: Self-pay

## 2018-11-02 ENCOUNTER — Encounter: Payer: Self-pay | Admitting: Cardiovascular Disease

## 2018-11-02 ENCOUNTER — Telehealth (INDEPENDENT_AMBULATORY_CARE_PROVIDER_SITE_OTHER): Payer: PPO | Admitting: Cardiovascular Disease

## 2018-11-02 VITALS — BP 153/55 | HR 61 | Ht 70.0 in | Wt 206.0 lb

## 2018-11-02 DIAGNOSIS — M4802 Spinal stenosis, cervical region: Secondary | ICD-10-CM | POA: Diagnosis not present

## 2018-11-02 DIAGNOSIS — E785 Hyperlipidemia, unspecified: Secondary | ICD-10-CM | POA: Diagnosis not present

## 2018-11-02 DIAGNOSIS — I10 Essential (primary) hypertension: Secondary | ICD-10-CM

## 2018-11-02 DIAGNOSIS — I2510 Atherosclerotic heart disease of native coronary artery without angina pectoris: Secondary | ICD-10-CM

## 2018-11-02 DIAGNOSIS — I480 Paroxysmal atrial fibrillation: Secondary | ICD-10-CM

## 2018-11-02 NOTE — Progress Notes (Signed)
Virtual Visit via Video Note   This visit type was conducted due to national recommendations for restrictions regarding the COVID-19 Pandemic (e.g. social distancing) in an effort to limit this patient's exposure and mitigate transmission in our community.  Due to his co-morbid illnesses, this patient is at least at moderate risk for complications without adequate follow up.  This format is felt to be most appropriate for this patient at this time.  All issues noted in this document were discussed and addressed.  A limited physical exam was performed with this format.  Please refer to the patient's chart for his consent to telehealth for [facility].   This started as a video visit but the patient could not connect.  Evaluation Performed:  Follow-up visit  Date:  11/02/2018   ID:  Benjamin Brewer, Benjamin Brewer Jul 09, 1931, MRN [id number]  Patient Location: Home Provider Location: Office  PCP:  Derinda Late, MD  Cardiologist:  Kathlyn Sacramento, MD  Electrophysiologist:  None   Chief Complaint: Follow-up visit  History of Present Illness:    Benjamin Brewer is a 85 y.o. male who was seen via video visit.  He has known history of coronary artery disease status post angioplasty and drug-eluting stent placement to the LAD and RCA in 2009.  He had one episode of atrial fibrillation with rapid ventricular response in 2015 and has been on anticoagulation since then.  Echocardiogram in July 2019 showed EF of 60 to 65%, calcified aortic valve without significant stenosis, mildly dilated left atrium and no significant pulmonary hypertension.  He went to the emergency room twice recently due to numbness in both arms worse on the left side with some dizziness.  He ruled out for stroke.  MRI showed severe foraminal narrowing bilaterally at C4-5 worse on the left side.  He was discharged on a tapering dose of prednisone with plans to follow-up with neurosurgery today. He reports that prednisone has helped  with his symptoms but caused some increased palpitations. He has rare episodes of chest pain but no shortness of breath.   The patient does not have symptoms concerning for COVID-19 infection (fever, chills, cough, or new shortness of breath).    Past Medical History:  Diagnosis Date  . Benign prostatic hypertrophy    Hx of   . CAD (coronary artery disease)    native vessel. Status post LAD and RCA angioplasty and drug-eluting stent placement in 2009  . Dyslipidemia   . History of tachycardia   . Hyperlipidemia   . Hypertension   . Intermediate coronary syndrome (HCC)    angina, unstable  . Other dyspnea and respiratory abnormality    on exertion   Past Surgical History:  Procedure Laterality Date  . [place]; x2  . [place]  . ESOPHAGOGASTRODUODENOSCOPY (EGD) WITH PROPOFOL N/A 06/10/2015   Procedure: ESOPHAGOGASTRODUODENOSCOPY (EGD) WITH PROPOFOL;  Surgeon: Manya Silvas, MD;  Location[facility] ENDOSCOPY;  Service: Endoscopy;  Laterality: N/A;  . HERNIA REPAIR    . SAVORY DILATION N/A 06/10/2015   Procedure: SAVORY DILATION;  Surgeon: Manya Silvas, MD;  Location: [facility] ENDOSCOPY;  Service: Endoscopy;  Laterality: N/A;  . TONSILLECTOMY       Current Meds  Medication Sig  . atorvastatin (LIPITOR) 40 MG tablet TAKE 1 TABLET BY MOUTH EVERY MORNING  . cyanocobalamin 500 MCG tablet Take 500 mcg by mouth  daily.  Marland Kitchen ELIQUIS 5 MG TABS tablet TAKE 1 TABLET BY MOUTH TWICE DAILY  . ferrous sulfate (FERRO-BOB) 325 (65 FE) MG tablet Take 325 mg by mouth daily.    . finasteride (PROSCAR) 5 MG tablet Take 5 mg by mouth daily.  . furosemide (LASIX) 20 MG tablet Take 1 tablet (20 mg total) by mouth daily as needed for fluid or edema.  Marland Kitchen losartan (COZAAR) 25 MG tablet Take 1 tablet (25 mg total) by mouth daily.  . metoprolol succinate (TOPROL-XL) 25 MG 24 hr tablet TAKE 1/2 TABLET BY MOUTH  DAILY  . Multiple Vitamins-Minerals (EYE VITAMINS PO) Take by mouth 2 (two) times daily.   . nitroGLYCERIN (NITROSTAT) 0.4 MG SL tablet place 1 tablet under the tongue if needed every 5 minutes fo (Patient taking differently: place 1 tablet (0.4mg ) under the tongue every 5 minutes as needed for chest pain)  . pantoprazole (PROTONIX) 40 MG tablet TAKE 1 TABLET BY MOUTH DAILY     Allergies:   Terazosin   Social History   Tobacco Use  . Smoking status: Never Smoker  . Smokeless tobacco: Never Used  Substance Use Topics  . Alcohol use: Yes    Comment: occasional   . Drug use: No     Family Hx: The patient's family history includes Heart attack in his mother; Hypertension in his mother; Stroke in his mother and another family member.  ROS:   Please see the history of present illness.     All other systems reviewed and are negative.   Prior CV studies:   The following studies were reviewed today:  I reviewed echocardiogram with him done in last year  Labs/Other Tests and Data Reviewed:    EKG:  An ECG dated 10/30/2018 was personally reviewed today and demonstrated:  Sinus rhythm with no significant ST changes.  Recent Labs: 01/11/2018: TSH 1.970 01/14/2018: B Natriuretic Peptide 17.8 10/28/2018: ALT 19; BUN 18; Creatinine, Ser 1.35; Hemoglobin 13.1; Magnesium 2.3; Platelets 258; Potassium 3.6; Sodium 139   Recent Lipid Panel Lab Results  Component Value Date/Time   CHOL 115 10/08/2012 05:54 AM   TRIG 75 10/08/2012 05:54 AM   HDL 43 10/08/2012 05:54 AM   LDLCALC 57 10/08/2012 05:54 AM    Wt Readings from Last 3 Encounters:  11/02/18 206 lb (93.4 kg)  10/30/18 210 lb (95.3 kg)  10/28/18 210 lb (95.3 kg)     Objective:    Vital Signs:  BP (!) 153/55   Pulse 61   Ht 5\' 10"  (1.778 m)   Wt 206 lb (93.4 kg)   BMI 29.56 kg/m      ASSESSMENT & PLAN:    1.  Coronary artery disease involving native coronary arteries without angina:  Continue medical therapy. He is not  on antiplatelet medications given that he is on long-term anticoagulation.   He reports very rare episodes of chest discomfort and overall he has been stable.  No need for further ischemic work-up.  2. Paroxysmal atrial fibrillation: No recurrent atrial fibrillation since last visit.  Continue Toprol.  Continue anticoagulation with Eliquis.  I reviewed most recent labs in January which showed stable creatinine at 1.2. Some palpitations as expected with steroids.  3. Essential hypertension: Blood pressure is elevated but could be due to steroid use.  Continue to monitor for now.  4. Hyperlipidemia: Continue treatment with atorvastatin .   5. Severe cervical spine stenosis: The patient is going to see neurosurgery today.  If surgery is  needed, the patient is considered at low risk from a cardiac standpoint.  He will need to hold Eliquis 3 days before surgery.   COVID-19 Education: The signs and symptoms of COVID-19 were discussed with the patient and how to seek care for testing (follow up with PCP or arrange E-visit).  The importance of social distancing was discussed today.  Time:   Today, I have spent 25 minutes with the patient with telehealth technology discussing the above problems.     Medication Adjustments/Labs and Tests Ordered: Current medicines are reviewed at length with the patient today.  Concerns regarding medicines are outlined above.   Tests Ordered: No orders of the defined types were placed in this encounter.   Medication Changes: No orders of the defined types were placed in this encounter.   Disposition:  Follow up in 4 month(s)  Signed, Kathlyn Sacramento, MD  11/02/2018 10:53 AM    [facility]

## 2018-11-02 NOTE — Patient Instructions (Signed)
Medication Instructions:  No change in medications If you need a refill on your cardiac medications before your next appointment, please call your pharmacy.   Lab work: None If you have labs (blood work) drawn today and your tests are completely normal, you will receive your results only by: Marland Kitchen MyChart Message (if you have MyChart) OR . A paper copy in the mail If you have any lab test that is abnormal or we need to change your treatment, we will call you to review the results.  Testing/Procedures: None  Follow-Up: At [facility], you and your health needs are our priority.  As part of our continuing mission to provide you with exceptional heart care, we have created designated Provider Care Teams.  These Care Teams include your primary Cardiologist (physician) and Advanced Practice Providers (APPs -  Physician Assistants and Nurse Practitioners) who all work together to provide you with the care you need, when you need it. You will need a follow up appointment in 4 months.  Please call our office 2 months in advance to schedule this appointment.  You may see Kathlyn Sacramento, MD or one of the following Advanced Practice Providers on your designated Care Team:   Murray Hodgkins, NP Christell Faith, PA-C . Marrianne Mood, PA-C

## 2018-11-03 ENCOUNTER — Telehealth: Payer: Self-pay | Admitting: Cardiovascular Disease

## 2018-11-03 NOTE — Telephone Encounter (Signed)
Patient c/o Palpitations:  High priority if patient c/o lightheadedness, shortness of breath, or chest pain  1) How long have you had palpitations/irregular HR/ Afib? Are you having the symptoms now? It started 30 minutes ago  2) Are you currently experiencing lightheadedness, SOB or CP? Not severe but lightheadedness and arm tingling   3) Do you have a history of afib (atrial fibrillation) or irregular heart rhythm? Yes   4) Have you checked your BP or HR? (document readings if available):  178/112  138/103 184/113 HR: 128-139  5) Are you experiencing any other symptoms?

## 2018-11-03 NOTE — Telephone Encounter (Signed)
Returned the call to the patient. He has been made aware to increase the Metoprolol Succinate to 25 mg.  An in office appointment has been made for tomorrow with Dr. Saunders Revel (D[facility]) for an EKG and evaluation. The patient has verbalized his understanding.   He has been advised to call the on call tonight if he feels worse.   COVID-19 Pre-Screening Questions:  . Do you currently have a fever? No  . Have you recently travelled on a cruise, internationally, or to [place], [place], [place], [place], [place], or [place], [place] [employer]) ? No  . Have you been in contact with someone that is currently pending confirmation of Covid19 testing or has been confirmed to have the [place] virus?  No . Are you currently experiencing fatigue or cough? No

## 2018-11-03 NOTE — Telephone Encounter (Signed)
Palpitations with the prednisone are to be expected, as noted in his last office visit.   Does he have access to a rhythm strip or Kardia / [place] watch? It does sound as if he is back in atrial fibrillation.   Please set him up for a virtual visit with Dr. Fletcher Anon for further management.   Please have him double his daily dose of metoprolol succinate for his elevated HR, so that he is taking a total of metoprolol succinate 25 mg daily.   Please have him document updated HR/BP to have with him at the time of visit with Dr. Fletcher Anon.

## 2018-11-03 NOTE — Telephone Encounter (Signed)
Returned the call to the patient. He stated that he feels like he is in atrial fibrillation. This has been going on for 30 minutes today. The patient denies chest pain and shortness of breath. He stated that it feels more like a heaviness in his chest and tingling in his arm.   He currently takes Metoprolol Succinate 12.5 mg daily which he takes in the morning and Eliquis 5 mg bid.   He is currently taking prednisone for his back which has been causing his palpitations but now he feels like it is afib.   While on the phone his blood pressure was 158/96 and heart rate is 132. Message routed to a provider for recommendations.

## 2018-11-04 ENCOUNTER — Ambulatory Visit: Payer: PPO | Admitting: Internal Medicine

## 2018-11-04 ENCOUNTER — Telehealth: Payer: Self-pay | Admitting: Cardiovascular Disease

## 2018-11-04 DIAGNOSIS — R42 Dizziness and giddiness: Secondary | ICD-10-CM | POA: Diagnosis not present

## 2018-11-04 DIAGNOSIS — G629 Polyneuropathy, unspecified: Secondary | ICD-10-CM | POA: Diagnosis not present

## 2018-11-04 NOTE — Telephone Encounter (Signed)
Left a message for the patient to call back.  

## 2018-11-04 NOTE — Progress Notes (Deleted)
Follow-up Outpatient Visit Date: 11/04/2018  Primary Care Provider: Derinda Late, MD [phone] [facility] [place] [address]   Primary Cardiologist: Kathlyn Sacramento, MD  Chief Complaint: ***  HPI:  [name] is a 85 y.o. year-old male with history of coronary artery disease status post remote PCI's to the LAD and RCA, paroxysmal atrial fibrillation, hypertension, and hyperlipidemia, who presents for an acute visit for evaluation of palpitations and elevated heart rate.  He had a virtual visit with Dr. Fletcher Anon 2 days ago, at which time he noted recent episodes of numbness in both arms accompanied by dizziness.  He was noted to have severe cervical spine disease and was placed on prednisone pending neurosurgical evaluation.  The paresthesias in his arms improved with prednisone, though he reported having increase in palpitations.  It was felt that increased palpitations were related to steroid use.  He contacted the office yesterday complaining of continued palpitations with significantly elevated blood pressure and pulse (blood pressure 138-184/103-113 with heart rates ranging from 1 28-139).  --------------------------------------------------------------------------------------------------  Past Medical History:  Diagnosis Date  . Benign prostatic hypertrophy    Hx of   . CAD (coronary artery disease)    native vessel. Status post LAD and RCA angioplasty and drug-eluting stent placement in 2009  . Dyslipidemia   . History of tachycardia   . Hyperlipidemia   . Hypertension   . Intermediate coronary syndrome (HCC)    angina, unstable  . Other dyspnea and respiratory abnormality    on exertion   Past Surgical History:  Procedure Laterality Date  . [place]; x2  . [place]  . ESOPHAGOGASTRODUODENOSCOPY (EGD) WITH PROPOFOL N/A  06/10/2015   Procedure: ESOPHAGOGASTRODUODENOSCOPY (EGD) WITH PROPOFOL;  Surgeon: Manya Silvas, MD;  Location: [facility] ENDOSCOPY;  Service: Endoscopy;  Laterality: N/A;  . HERNIA REPAIR    . SAVORY DILATION N/A 06/10/2015   Procedure: SAVORY DILATION;  Surgeon: Manya Silvas, MD;  Location: [facility] ENDOSCOPY;  Service: Endoscopy;  Laterality: N/A;  . TONSILLECTOMY      No outpatient medications have been marked as taking for the 11/04/18 encounter (Appointment) with Estevon Fluke, Harrell Gave, MD.    Allergies: Terazosin  Social History   Tobacco Use  . Smoking status: Never Smoker  . Smokeless tobacco: Never Used  Substance Use Topics  . Alcohol use: Yes    Comment: occasional   . Drug use: No    Family History  Problem Relation Age of Onset  . Stroke Mother   . Heart attack Mother   . Hypertension Mother   . Stroke Other        family history    Review of Systems: A 12-system review of systems was performed and was negative except as noted in the HPI.  --------------------------------------------------------------------------------------------------  Physical Exam: There were no vitals taken for this visit.  General:  *** HEENT: No conjunctival pallor or scleral icterus. Moist mucous membranes.  OP clear. Neck: Supple without lymphadenopathy, thyromegaly, JVD, or HJR. No carotid bruit. Lungs: Normal work of breathing. Clear to auscultation bilaterally without wheezes or crackles. Heart: Regular rate and rhythm without murmurs, rubs, or gallops. Non-displaced PMI. Abd: Bowel sounds present. Soft, NT/ND without hepatosplenomegaly Ext: No lower extremity edema. Radial, PT, and DP pulses are 2+ bilaterally. Skin: Warm and dry without rash.  EKG:  ***  Lab Results  Component Value Date   WBC 7.1 10/28/2018   HGB 13.1 10/28/2018   HCT 39.6 10/28/2018   MCV 92.5 10/28/2018   PLT 258 10/28/2018    Lab Results  Component Value Date   NA 139 10/28/2018   K 3.6 10/28/2018    CL 103 10/28/2018   CO2 26 10/28/2018   BUN 18 10/28/2018   CREATININE 1.35 (H) 10/28/2018   GLUCOSE 127 (H) 10/28/2018   ALT 19 10/28/2018    Lab Results  Component Value Date   CHOL 115 10/08/2012   HDL 43 10/08/2012   LDLCALC 57 10/08/2012   TRIG 75 10/08/2012    --------------------------------------------------------------------------------------------------  ASSESSMENT AND PLAN: Harrell Gave Jeweldean Drohan, MD 11/04/2018 8:30 AM

## 2018-11-04 NOTE — Telephone Encounter (Signed)
Returned the call to the patient. He had an appointment today with DOD due to suspected atrial fibrillation yesterday, heart rate in the 130's. He currently takes Metoprolol Succinate 12.5 mg daily. He was advised to take an additional 12.5 mg yesterday.   This morning his blood pressure was 156/81 and heart rate was 60. This was prior to his Metoprolol. He took his normal dose of 12.5 mg this morning. He stated that he feels better this morning and feels like he is back in sinus. The additional 12.5 mg helped last night.   He had to cancel his appointment today since he got an appointment with his neuro surgeon at the same time.  He would like to know if he should keep taking 25 mg of the Metoprolol or stay on the 12.5 mg (half tablet) and only take an additional 12.5 mg as needed.

## 2018-11-04 NOTE — Telephone Encounter (Signed)
Patient calling to cancel his 10:30 appointment - got in to his neurologist at 10:40 and has been trying for that appointment for months  States that he is out of a fib  His BP is 156/81 HR 60 and temperature is 96.4 He would like to know if he should continue increasing metoprolol Please call to discuss

## 2018-11-04 NOTE — Telephone Encounter (Signed)
Lets keep him on Toprol 25 mg once daily.  If his heart rate goes below 50, he can cut down to 12.5 mg once daily.

## 2018-11-07 MED ORDER — METOPROLOL SUCCINATE ER 25 MG PO TB24: 25.0000 mg | ORAL_TABLET | Freq: Every day | ORAL | 1 refills | Status: DC

## 2018-11-07 NOTE — Telephone Encounter (Signed)
The patient has been made aware of Dr. Tyrell Antonio recommendations. Metoprolol Succinate has been increased to 25 mg daily. He will monitor his heart rate and go back to 12.5 mg if his heart rate sustains below 50.

## 2018-11-08 DIAGNOSIS — M4802 Spinal stenosis, cervical region: Secondary | ICD-10-CM | POA: Diagnosis not present

## 2018-11-08 DIAGNOSIS — G5603 Carpal tunnel syndrome, bilateral upper limbs: Secondary | ICD-10-CM | POA: Diagnosis not present

## 2018-11-08 DIAGNOSIS — R200 Anesthesia of skin: Secondary | ICD-10-CM | POA: Diagnosis not present

## 2018-11-09 ENCOUNTER — Other Ambulatory Visit: Payer: Self-pay | Admitting: Neurological Surgery

## 2018-11-09 DIAGNOSIS — R42 Dizziness and giddiness: Secondary | ICD-10-CM

## 2018-11-13 ENCOUNTER — Encounter (HOSPITAL_COMMUNITY): Payer: Self-pay

## 2018-11-13 ENCOUNTER — Emergency Department (HOSPITAL_COMMUNITY)
Admission: EM | Admit: 2018-11-13 | Discharge: 2018-11-13 | Disposition: A | Discharge status: Home / Self Care | Payer: PPO | Attending: Emergency Medicine | Admitting: Emergency Medicine

## 2018-11-13 ENCOUNTER — Emergency Department (HOSPITAL_COMMUNITY): Payer: PPO

## 2018-11-13 DIAGNOSIS — R002 Palpitations: Secondary | ICD-10-CM | POA: Diagnosis not present

## 2018-11-13 DIAGNOSIS — I129 Hypertensive chronic kidney disease with stage 1 through stage 4 chronic kidney disease, or unspecified chronic kidney disease: Secondary | ICD-10-CM | POA: Insufficient documentation

## 2018-11-13 DIAGNOSIS — Z7901 Long term (current) use of anticoagulants: Secondary | ICD-10-CM | POA: Insufficient documentation

## 2018-11-13 DIAGNOSIS — I259 Chronic ischemic heart disease, unspecified: Secondary | ICD-10-CM | POA: Insufficient documentation

## 2018-11-13 DIAGNOSIS — N183 Chronic kidney disease, stage 3 (moderate): Secondary | ICD-10-CM | POA: Diagnosis not present

## 2018-11-13 DIAGNOSIS — Z79899 Other long term (current) drug therapy: Secondary | ICD-10-CM | POA: Diagnosis not present

## 2018-11-13 DIAGNOSIS — I4891 Unspecified atrial fibrillation: Secondary | ICD-10-CM | POA: Diagnosis not present

## 2018-11-13 HISTORY — DX: Unspecified atrial fibrillation: I48.91

## 2018-11-13 LAB — CBC
HCT: 37.3 % — ABNORMAL LOW (ref 39.0–52.0)
Hemoglobin: 12.2 g/dL — ABNORMAL LOW (ref 13.0–17.0)
MCH: 30.1 pg (ref 26.0–34.0)
MCHC: 32.7 g/dL (ref 30.0–36.0)
MCV: 92.1 fL (ref 80.0–100.0)
Platelets: 232 10*3/uL (ref 150–400)
RBC: 4.05 MIL/uL — ABNORMAL LOW (ref 4.22–5.81)
RDW: 13.1 % (ref 11.5–15.5)
WBC: 8.4 10*3/uL (ref 4.0–10.5)
nRBC: 0 % (ref 0.0–0.2)

## 2018-11-13 LAB — BASIC METABOLIC PANEL
Anion gap: 12 (ref 5–15)
BUN: 20 mg/dL (ref 8–23)
CO2: 23 mmol/L (ref 22–32)
Calcium: 9.8 mg/dL (ref 8.9–10.3)
Chloride: 102 mmol/L (ref 98–111)
Creatinine, Ser: 1.59 mg/dL — ABNORMAL HIGH (ref 0.61–1.24)
GFR calc Af Amer: 45 mL/min — ABNORMAL LOW (ref >60)
GFR calc non Af Amer: 39 mL/min — ABNORMAL LOW (ref >60)
Glucose, Bld: 131 mg/dL — ABNORMAL HIGH (ref 70–99)
Potassium: 4.4 mmol/L (ref 3.5–5.1)
Sodium: 137 mmol/L (ref 135–145)

## 2018-11-13 LAB — TROPONIN I: Troponin I: 0.03 ng/mL (ref <0.03)

## 2018-11-13 LAB — PROTIME-INR
INR: 1.1 (ref 0.8–1.2)
Prothrombin Time: 13.9 seconds (ref 11.4–15.2)

## 2018-11-13 MED ORDER — DILTIAZEM HCL-DEXTROSE 100-5 MG/100ML-% IV SOLN (PREMIX)
5.0000 mg/h | INTRAVENOUS | Status: DC
  Administered 2018-11-13: 5 mg/h via INTRAVENOUS
  Filled 2018-11-13: qty 100

## 2018-11-13 MED ORDER — DILTIAZEM LOAD VIA INFUSION
10.0000 mg | Freq: Once | INTRAVENOUS | Status: AC
  Administered 2018-11-13: 10 mg via INTRAVENOUS
  Filled 2018-11-13: qty 10

## 2018-11-13 MED ORDER — SODIUM CHLORIDE 0.9% FLUSH
3.0000 mL | Freq: Once | INTRAVENOUS | Status: AC
  Administered 2018-11-13: 3 mL via INTRAVENOUS

## 2018-11-13 NOTE — ED Provider Notes (Addendum)
[facility] EMERGENCY DEPARTMENT Provider Note   CSN: [id number] Arrival date & time: 11/13/18  0007    History   Chief Complaint Chief Complaint  Patient presents with   Palpitations    HPI Benjamin Brewer is a 85 y.o. male.     The history is provided by the patient.  Palpitations  Palpitations quality:  Irregular Onset quality:  Sudden Timing:  Constant Progression:  Unchanged Chronicity:  New Context: not anxiety, not appetite suppressants and not blood loss   Relieved by:  Nothing Worsened by:  Nothing Ineffective treatments:  None tried Associated symptoms: no back pain, no chest pain, no chest pressure, no cough, no diaphoresis, no dizziness, no hemoptysis, no leg pain, no lower extremity edema, no malaise/fatigue, no nausea, no near-syncope, no numbness, no orthopnea, no PND, no shortness of breath, no syncope, no vomiting and no weakness   Risk factors: hx of atrial fibrillation   Risk factors: no diabetes mellitus   H/o afib on eliquis presents with palpitations that taking an extra dose of lopressor did not fix.  No f/c/r.  No leg pain or swelling no CP no SOB.    Past Medical History:  Diagnosis Date   A-fib [facility])    Benign prostatic hypertrophy    Hx of    CAD (coronary artery disease)    native vessel. Status post LAD and RCA angioplasty and drug-eluting stent placement in 2009   Dyslipidemia    History of tachycardia    Hyperlipidemia    Hypertension    Intermediate coronary syndrome (HCC)    angina, unstable   Other dyspnea and respiratory abnormality    on exertion    Patient Active Problem List   Diagnosis Date Noted   Acute renal failure superimposed on stage 3 chronic kidney disease ([place]) 01/14/2018   Left arm numbness 01/14/2018   Generalized weakness 01/14/2018   Left shoulder pain    Atrial fibrillation ([place]) 08/20/2014   BPPV (benign paroxysmal positional vertigo) 10/23/2013   Sinus bradycardia  06/16/2013   Essential hypertension 06/16/2013   CAD, NATIVE VESSEL 07/03/2009   DYSPNEA ON EXERTION 12/21/2008   Hyperlipidemia 12/15/2008   BENIGN PROSTATIC HYPERTROPHY, HX OF 12/15/2008    Past Surgical History:  Procedure Laterality Date   [place]; x2   [place]   ESOPHAGOGASTRODUODENOSCOPY (EGD) WITH PROPOFOL N/A 06/10/2015   Procedure: ESOPHAGOGASTRODUODENOSCOPY (EGD) WITH PROPOFOL;  Surgeon: Manya Silvas, MD;  Location: [place];  Service: Endoscopy;  Laterality: N/A;   HERNIA REPAIR     SAVORY DILATION N/A 06/10/2015   Procedure: SAVORY DILATION;  Surgeon: Manya Silvas, MD;  Location: [facility] ENDOSCOPY;  Service: Endoscopy;  Laterality: N/A;   TONSILLECTOMY          Home Medications    Prior to Admission medications   Medication Sig Start Date End Date Taking? Authorizing Provider  atorvastatin (LIPITOR) 40 MG tablet TAKE 1 TABLET BY MOUTH EVERY MORNING Patient taking differently: Take 40 mg by mouth daily.  10/12/18  Yes Wellington Hampshire, MD  cyanocobalamin 500 MCG tablet Take 500 mcg by mouth daily.   Yes [provider]  ELIQUIS 5 MG TABS tablet TAKE 1 TABLET BY MOUTH TWICE DAILY Patient taking differently: Take 5 mg by mouth 2 (two) times daily.  01/31/18  Yes Rise Mu, PA-C  ferrous sulfate (  FERRO-BOB) 325 (65 FE) MG tablet Take 325 mg by mouth daily.     Yes [provider]  finasteride (PROSCAR) 5 MG tablet Take 5 mg by mouth daily. 09/28/18  Yes [provider]  furosemide (LASIX) 20 MG tablet Take 1 tablet (20 mg total) by mouth daily as needed for fluid or edema. 01/15/18  Yes Elgergawy, Silver Huguenin, MD  losartan (COZAAR) 25 MG tablet Take 1 tablet (25 mg total) by mouth daily. 07/19/18 11/13/27 Yes Dunn, Areta Haber, PA-C  metoprolol succinate (TOPROL-XL) 25 MG 24 hr tablet Take 1 tablet (25 mg total) by mouth daily. Patient  taking differently: Take 12.5 mg by mouth daily.  11/07/18  Yes Wellington Hampshire, MD  Multiple Vitamins-Minerals (EYE VITAMINS PO) Take 1 tablet by mouth 2 (two) times daily.    Yes [provider]  nitroGLYCERIN (NITROSTAT) 0.4 MG SL tablet place 1 tablet under the tongue if needed every 5 minutes fo Patient taking differently: Place 0.4 mg under the tongue every 5 (five) minutes as needed for chest pain.  03/29/17  Yes Wellington Hampshire, MD  pantoprazole (PROTONIX) 40 MG tablet TAKE 1 TABLET BY MOUTH DAILY Patient taking differently: Take 40 mg by mouth daily.  09/23/18  Yes Wellington Hampshire, MD    Family History Family History  Problem Relation Age of Onset   Stroke Mother    Heart attack Mother    Hypertension Mother    Stroke Other        family history    Social History Social History   Tobacco Use   Smoking status: Never Smoker   Smokeless tobacco: Never Used  Substance Use Topics   Alcohol use: Yes    Comment: occasional    Drug use: No     Allergies   Terazosin   Review of Systems Review of Systems  Constitutional: Negative for diaphoresis and malaise/fatigue.  Respiratory: Negative for cough, hemoptysis, choking, shortness of breath and wheezing.   Cardiovascular: Positive for palpitations. Negative for chest pain, orthopnea, leg swelling, syncope, PND and near-syncope.  Gastrointestinal: Negative for nausea and vomiting.  Musculoskeletal: Negative for back pain.  Neurological: Negative for dizziness, weakness and numbness.  All other systems reviewed and are negative.    Physical Exam Updated Vital Signs BP 112/75    Pulse (!) 55    Temp 97.9 F (36.6 C) (Oral)    Resp 14    Ht 5\' 10"  (1.778 m)    Wt 93 kg    SpO2 97%    BMI 29.41 kg/m   Physical Exam Vitals signs and nursing note reviewed.  Constitutional:      Appearance: He is normal weight. He is not ill-appearing or diaphoretic.  HENT:     Head: Normocephalic and atraumatic.      Nose: Nose normal.  Eyes:     Conjunctiva/sclera: Conjunctivae normal.     Pupils: Pupils are equal, round, and reactive to light.  Neck:     Musculoskeletal: Normal range of motion and neck supple.  Cardiovascular:     Rate and Rhythm: Tachycardia present. Rhythm irregular.     Pulses: Normal pulses.     Heart sounds: Normal heart sounds.  Pulmonary:     Effort: Pulmonary effort is normal.     Breath sounds: Normal breath sounds. No wheezing or rales.  Abdominal:     General: Abdomen is flat. Bowel sounds are normal.     Tenderness: There is no abdominal tenderness.  There is no guarding.  Musculoskeletal: Normal range of motion.        General: No tenderness.     Right lower leg: No edema.     Left lower leg: No edema.  Skin:    General: Skin is warm and dry.     Capillary Refill: Capillary refill takes less than 2 seconds.  Neurological:     General: No focal deficit present.     Mental Status: He is alert and oriented to person, place, and time.  Psychiatric:        Mood and Affect: Mood normal.        Behavior: Behavior normal.      ED Treatments / Results  Labs (all labs ordered are listed, but only abnormal results are displayed) Results for orders placed or performed during the hospital encounter of 16/10/96  Basic metabolic panel  Result Value Ref Range   Sodium 137 135 - 145 mmol/L   Potassium 4.4 3.5 - 5.1 mmol/L   Chloride 102 98 - 111 mmol/L   CO2 23 22 - 32 mmol/L   Glucose, Bld 131 (H) 70 - 99 mg/dL   BUN 20 8 - 23 mg/dL   Creatinine, Ser 1.59 (H) 0.61 - 1.24 mg/dL   Calcium 9.8 8.9 - 10.3 mg/dL   GFR calc non Af Amer 39 (L) >60 mL/min   GFR calc Af Amer 45 (L) >60 mL/min   Anion gap 12 5 - 15  CBC  Result Value Ref Range   WBC 8.4 4.0 - 10.5 K/uL   RBC 4.05 (L) 4.22 - 5.81 MIL/uL   Hemoglobin 12.2 (L) 13.0 - 17.0 g/dL   HCT 37.3 (L) 39.0 - 52.0 %   MCV 92.1 80.0 - 100.0 fL   MCH 30.1 26.0 - 34.0 pg   MCHC 32.7 30.0 - 36.0 g/dL   RDW 13.1  11.5 - 15.5 %   Platelets 232 150 - 400 K/uL   nRBC 0.0 0.0 - 0.2 %  Protime-INR- (order if Patient is taking Coumadin / Warfarin)  Result Value Ref Range   Prothrombin Time 13.9 11.4 - 15.2 seconds   INR 1.1 0.8 - 1.2  Troponin I - ONCE - STAT  Result Value Ref Range   Troponin I 0.03 (HH) <0.03 ng/mL   Dg Chest 2 View  Result Date: 11/13/2018 CLINICAL DATA:  Palpitations EXAM: CHEST - 2 VIEW COMPARISON:  06/28/2018 FINDINGS: Mild hyperinflation. Heart is normal size. Scarring in the lung bases, stable. No confluent airspace opacities or effusions. No acute bony abnormality. IMPRESSION: Hyperinflation.  No active disease. Electronically Signed   By: Rolm Baptise M.D.   On: 11/13/2018 00:51   Ct Head Wo Contrast  Result Date: 10/28/2018 CLINICAL DATA:  Ataxia.  Stroke suspected.  Vertigo.  Episodic. EXAM: CT HEAD WITHOUT CONTRAST TECHNIQUE: Contiguous axial images were obtained from the base of the skull through the vertex without intravenous contrast. COMPARISON:  MRI brain 01/12/2018 FINDINGS: Brain: Mild atrophy and white matter changes are stable. No acute infarct, hemorrhage, or mass lesion is present. The ventricles are of proportionate to the degree of atrophy. No significant extraaxial fluid collection is present. The brainstem and cerebellum are within normal limits. Vascular: Atherosclerotic changes are present within the cavernous internal carotid arteries bilaterally. There is no hyperdense vessel. Skull: Calvarium is intact. No focal lytic or blastic lesions are present. Sinuses/Orbits: The paranasal sinuses and mastoid air cells are clear. The globes and orbits are within normal limits.  IMPRESSION: 1. No acute intracranial abnormality to explain the patient's new onset dizziness. 2. Stable atrophy and white matter disease. 3. Atherosclerosis Electronically Signed   By: San Morelle M.D.   On: 10/28/2018 07:29   Mr Brain Wo Contrast  Result Date: 10/28/2018 CLINICAL DATA:   Focal neuro deficit for greater than 6 hours. Dizziness beginning yesterday. EXAM: MRI HEAD WITHOUT CONTRAST TECHNIQUE: Multiplanar, multiecho pulse sequences of the brain and surrounding structures were obtained without intravenous contrast. COMPARISON:  CT head without contrast 10/28/2018 and MRI brain 01/12/2018 FINDINGS: Brain: The diffusion-weighted images demonstrate no acute or subacute infarction. Mild atrophy and white matter changes are similar to the prior study. No acute hemorrhage or mass lesion is present. The ventricles are of proportionate to the degree of atrophy. The internal auditory canals are within normal limits. The brainstem and cerebellum are within normal limits. Vascular: Flow is present in the major intracranial arteries. Skull and upper cervical spine: The craniocervical junction is normal. Upper cervical spine is within normal limits. Marrow signal is unremarkable. Sinuses/Orbits: The paranasal sinuses and mastoid air cells are clear. A right lens replacement is noted. Globes and orbits are otherwise within normal limits. IMPRESSION: 1. Normal MRI of the brain for age. No acute abnormality or significant change from previous studies. 2. No acute or focal abnormality to explain dizziness. Electronically Signed   By: San Morelle M.D.   On: 10/28/2018 11:49   Mr Cervical Spine Wo Contrast  Result Date: 10/28/2018 CLINICAL DATA:  Neck pain, chronic, abnormal neuro exam. Seen at urgent care yesterday for dizziness. EXAM: MRI CERVICAL SPINE WITHOUT CONTRAST TECHNIQUE: Multiplanar, multisequence MR imaging of the cervical spine was performed. No intravenous contrast was administered. COMPARISON:  None. FINDINGS: Alignment: Slight retrolisthesis is present at C4-5. Slight anterolisthesis is present at C5-6. AP alignment is otherwise anatomic. Vertebrae: Chronic endplate changes are most noted at C1-2, C3-4, C4-5, and C5-6. Marrow signal and vertebral body heights are otherwise  normal. Cord: Normal signal is present in the cervical and upper thoracic spinal cord to the lowest imaged level, T2-3. Posterior Fossa, vertebral arteries, paraspinal tissues: The craniocervical junction is normal. Visualized intracranial contents are normal. Flow is present in the vertebral arteries bilaterally. Disc levels: C1-2: Negative C2-3: Asymmetric left-sided uncovertebral and facet disease leads to moderate left and mild right foraminal narrowing. C3-4: Left posterior elements are fused. There is probable fusion across the disc space is well. A prominent disc osteophyte complex is present. There is partial effacement of the ventral CSF. Moderate foraminal narrowing is present bilaterally, left greater than right. C4-5: A broad-based disc osteophyte complex is present. Uncovertebral spurring contributes to severe foraminal narrowing bilaterally. Facet hypertrophy is worse on the right. C5-6: A broad-based disc osteophyte complex is present. Moderate foraminal narrowing is present bilaterally due to uncovertebral and facet disease. C6-7: A left paramedian disc protrusion effaces the ventral CSF and likely contacts the cord. Moderate foraminal narrowing is worse on the right. C7-T1: Negative. IMPRESSION: 1. Multilevel spondylosis of the cervical spine as described. 2. No acute or focal abnormality to explain dizziness. 3. Acquired fusion of posterior elements on the left at C3-4 and likely across the disc space. 4. Mild central and moderate bilateral foraminal narrowing at C3-4 is worse on the left. 5. Severe foraminal narrowing bilaterally at C4-5 is worse on the left. 6. Moderate foraminal narrowing at C5-6 and C6-7. 7. Left paramedian soft disc protrusion at C6-7 with moderate left central canal stenosis. Electronically Signed  By: San Morelle M.D.   On: 10/28/2018 11:36   EKG EKG Interpretation  Date/Time:  Sunday Nov 13 2018 00:09:54 EDT Ventricular Rate:  125 PR Interval:    QRS  Duration: 104 QT Interval:  344 QTC Calculation: 496 R Axis:     Text Interpretation:  Atrial fibrillation with rapid ventricular response with premature ventricular or aberrantly conducted complexes Low voltage QRS Cannot rule out Anterior infarct , age undetermined Abnormal ECG When compared with ECG of 10/30/2018, Atrial fibrillation with rapid ventricular response has replaced Sinus rhythm Confirmed by Delora Fuel ([id number]) on 11/13/2018 12:20:43 AM   Radiology Dg Chest 2 View  Result Date: 11/13/2018 CLINICAL DATA:  Palpitations EXAM: CHEST - 2 VIEW COMPARISON:  06/28/2018 FINDINGS: Mild hyperinflation. Heart is normal size. Scarring in the lung bases, stable. No confluent airspace opacities or effusions. No acute bony abnormality. IMPRESSION: Hyperinflation.  No active disease. Electronically Signed   By: Rolm Baptise M.D.   On: 11/13/2018 00:51    Procedures Procedures (including critical care time)   EKG Interpretation  Date/Time:  Sunday Nov 13 2018 02:16:13 EDT Ventricular Rate:  58 PR Interval:    QRS Duration: 105 QT Interval:  426 QTC Calculation: 419 R Axis:   -20 Text Interpretation:  Sinus rhythm Borderline left axis deviation Confirmed by Randal Buba, Alphia Behanna ([id number]) on 11/13/2018 2:29:59 AM       Medications Ordered in ED Medications  diltiazem (CARDIZEM) 1 mg/mL load via infusion 10 mg (10 mg Intravenous Bolus from Bag 11/13/18 0152)    And  diltiazem (CARDIZEM) 100 mg in dextrose 5% 110mL (1 mg/mL) infusion (5 mg/hr Intravenous New Bag/Given 11/13/18 0153)  sodium chloride flush (NS) 0.9 % injection 3 mL (3 mLs Intravenous Given 11/13/18 0121)    Converted on a diltiazem drip.  Stable for discharge with close follow up.    MDM Reviewed: previous chart, nursing note and vitals Interpretation: labs, ECG and x-ray Total time providing critical care: 75-105 minutes (diltiazem drip). This excludes time spent performing separately reportable procedures and services.  CRITICAL  CARE Performed by: Tannar Broker K Giavonna Pflum-Rasch Total critical care time: 75 minutes Critical care time was exclusive of separately billable procedures and treating other patients. Critical care was necessary to treat or prevent imminent or life-threatening deterioration. Critical care was time spent personally by me on the following activities: development of treatment plan with patient and/or surrogate as well as nursing, discussions with consultants, evaluation of patient's response to treatment, examination of patient, obtaining history from patient or surrogate, ordering and performing treatments and interventions, ordering and review of laboratory studies, ordering and review of radiographic studies, pulse oximetry and re-evaluation of patient's condition.  CHA2DS2/VAS Stroke Risk Points  Current as of 7 hours ago     5 >= 2 Points: High Risk  1 - 1.99 Points: Medium Risk  0 Points: Low Risk    The patient's score has not changed in the past year.:  No Change     Details    This score determines the patient's risk of having a stroke if the  patient has atrial fibrillation.       Points Metrics  1 Has Congestive Heart Failure:  Yes    Current as of 7 hours ago  1 Has Vascular Disease:  Yes    Current as of 7 hours ago  1 Has Hypertension:  Yes    Current as of 7 hours ago  2 Age:  69    Current  as of 7 hours ago  0 Has Diabetes:  No    Current as of 7 hours ago  0 Had Stroke:  No  Had TIA:  No  Had thromboembolism:  No    Current as of 7 hours ago  0 Male:  No    Current as of 7 hours ago          Final Clinical Impressions(s) / ED Diagnoses   Return for intractable cough, coughing up blood,fevers >100.4 unrelieved by medication, shortness of breath, intractable vomiting, chest pain, shortness of breath, weakness,numbness, changes in speech, facial asymmetry,abdominal pain, passing out,Inability to tolerate liquids or food, cough, altered mental status or any concerns.  No signs of systemic illness or infection. The patient is nontoxic-appearing on exam and vital signs are within normal limits.   I have reviewed the triage vital signs and the nursing notes. Pertinent labs &imaging results that were available during my care of the patient were reviewed by me and considered in my medical decision making (see chart for details).  After history, exam, and medical workup I feel the patient has been appropriately medically screened and is safe for discharge home. Pertinent diagnoses were discussed with the patient. Patient was given return precautions.    Edelyn Heidel, MD 11/13/18 [id number]    Veatrice Kells, MD 11/13/18 Farrel Gordon, Dylen Mcelhannon, MD 11/21/18 [id number]

## 2018-11-13 NOTE — ED Triage Notes (Signed)
Pt states he woke up and went to the bathroom and started to have palpitations, pt states he is still currently having some palpitations at this time. Pt states he has history of afib but this does not feel like afib at this time.

## 2018-11-13 NOTE — ED Notes (Signed)
Called flow to cancel triad consult, stated she would page Dr Blaine Hamper and relay this message.

## 2018-11-13 NOTE — ED Notes (Signed)
Patient verbalizes understanding of discharge instructions. Opportunity for questioning and answers were provided. Armband removed by staff, pt discharged from ED.  

## 2018-11-13 NOTE — ED Notes (Signed)
Pt went to x-ray.

## 2018-11-14 ENCOUNTER — Ambulatory Visit (HOSPITAL_COMMUNITY)
Admission: RE | Admit: 2018-11-14 | Discharge: 2018-11-14 | Disposition: A | Discharge status: Home / Self Care | Payer: PPO | Source: Ambulatory Visit | Attending: Physician Assistant | Admitting: Physician Assistant

## 2018-11-14 ENCOUNTER — Other Ambulatory Visit: Payer: Self-pay

## 2018-11-14 ENCOUNTER — Telehealth: Payer: Self-pay | Admitting: Cardiovascular Disease

## 2018-11-14 ENCOUNTER — Other Ambulatory Visit (HOSPITAL_COMMUNITY): Payer: Self-pay | Admitting: *Deleted

## 2018-11-14 VITALS — BP 136/76 | HR 61

## 2018-11-14 DIAGNOSIS — I480 Paroxysmal atrial fibrillation: Secondary | ICD-10-CM

## 2018-11-14 MED ORDER — DILTIAZEM HCL 30 MG PO TABS: ORAL_TABLET | ORAL | 1 refills | Status: DC

## 2018-11-14 NOTE — Telephone Encounter (Signed)
Patient calling States he was back in the ED on Friday for afib Wants to discuss with nurse Please call

## 2018-11-14 NOTE — Telephone Encounter (Signed)
Call to patient. He reports that he was seen in the ED on sat night for a fib.   They gave IV diltiazem and were able to restore NS rhythm. Pt on coumadin. He does report continued skipping of beats since but no sustained fast rates.   No current vitals available at this time.   Scheduled e visit for 5/5 @ 12 noon.   Verbally consented. Reviewed procedure for e visit.   Reaching out to provider to further advise.

## 2018-11-14 NOTE — Telephone Encounter (Signed)
Agree with virtual visit tomorrow.

## 2018-11-14 NOTE — Progress Notes (Signed)
Electrophysiology TeleHealth Note   Due to national recommendations of social distancing due to [place] 19, Audio telehealth visit is felt to be most appropriate for this patient at this time.  See consent below from today for patient consent regarding telehealth for the At[facility] F[facility]illation Clinic. Consent obtained verbally.   Date:  11/14/2018   ID:  Benjamin Brewer, DOB [birth year], MRN [id number]  Location: home  Provider location: [address] [place], [place] [address] Evaluation Performed: New patient consult  PCP:  Derinda Late, MD  Primary Cardiologist:  Dr Fletcher Anon  CC: Evaluation of paroxysmal atrial fibrillation.   History of Present Illness: Benjamin Brewer is a 85 y.o. male who presents via audio conferencing for a telehealth visit today.   The patient is referred for new consultation regarding paroxysmal atrial fibrillation by the [facility] ER. Patient has a history of CAD s/p DES to LAD 2009, HTN, HLD, and paroxysmal afib. Patient reports he began having palpitations for several days after starting a steroid taper for his back issues. Patient presented to ER on 11/13/18 and was found to be in afib with RVR. He converted after starting IV diltiazem. He denies having any symptoms of SOB, CP, or dizziness during his afib episode. He denies snoring or significant alcohol use.  Today, he denies symptoms of chest pain, shortness of breath, orthopnea, PND, lower extremity edema, claudication, dizziness, presyncope, syncope, bleeding, or neurologic sequela. The patient is tolerating medications without difficulties and is otherwise without complaint today.   he denies symptoms of cough, fevers, chills, or new SOB worrisome for COVID 19.     Atrial Fibrillation Risk Factors:  he does not have symptoms or diagnosis of sleep apnea. he does not have a history of rheumatic fever. he does not have a history of alcohol use. The patient does not have a history of early familial  atrial fibrillation or other arrhythmias.  he has a BMI of There is no height or weight on file to calculate BMI.. There were no vitals filed for this visit.  Past Medical History:  Diagnosis Date  . A-fib ([place])   . Benign prostatic hypertrophy    Hx of   . CAD (coronary artery disease)    native vessel. Status post LAD and RCA angioplasty and drug-eluting stent placement in 2009  . Dyslipidemia   . History of tachycardia   . Hyperlipidemia   . Hypertension   . Intermediate coronary syndrome (HCC)    angina, unstable  . Other dyspnea and respiratory abnormality    on exertion   Past Surgical History:  Procedure Laterality Date  . [place]; x2  . [place]  . ESOPHAGOGASTRODUODENOSCOPY (EGD) WITH PROPOFOL N/A 06/10/2015   Procedure: ESOPHAGOGASTRODUODENOSCOPY (EGD) WITH PROPOFOL;  Surgeon: Manya Silvas, MD;  Location: [facility] ENDOSCOPY;  Service: Endoscopy;  Laterality: N/A;  . HERNIA REPAIR    . SAVORY DILATION N/A 06/10/2015   Procedure: SAVORY DILATION;  Surgeon: Manya Silvas, MD;  Location: [facility] ENDOSCOPY;  Service: Endoscopy;  Laterality: N/A;  . TONSILLECTOMY       Current Outpatient Medications  Medication Sig Dispense Refill  . atorvastatin (LIPITOR) 40 MG tablet TAKE 1 TABLET BY MOUTH EVERY MORNING (Patient taking differently: Take 40 mg by mouth daily. ) 90 tablet 2  . cyanocobalamin 500 MCG tablet Take 500 mcg by  mouth daily.    Marland Kitchen ELIQUIS 5 MG TABS tablet TAKE 1 TABLET BY MOUTH TWICE DAILY (Patient taking differently: Take 5 mg by mouth 2 (two) times daily. ) 60 tablet 6  . ferrous sulfate (FERRO-BOB) 325 (65 FE) MG tablet Take 325 mg by mouth daily.      . finasteride (PROSCAR) 5 MG tablet Take 5 mg by mouth daily.    . furosemide (LASIX) 20 MG tablet Take 1 tablet (20 mg total) by mouth daily as needed for fluid or edema. 30 tablet 0  . losartan (COZAAR) 25  MG tablet Take 1 tablet (25 mg total) by mouth daily. 90 tablet 3  . metoprolol succinate (TOPROL-XL) 25 MG 24 hr tablet Take 1 tablet (25 mg total) by mouth daily. (Patient taking differently: Take 12.5 mg by mouth daily. ) 45 tablet 1  . Multiple Vitamins-Minerals (EYE VITAMINS PO) Take 1 tablet by mouth 2 (two) times daily.     . nitroGLYCERIN (NITROSTAT) 0.4 MG SL tablet place 1 tablet under the tongue if needed every 5 minutes fo (Patient taking differently: Place 0.4 mg under the tongue every 5 (five) minutes as needed for chest pain. ) 25 tablet 3  . pantoprazole (PROTONIX) 40 MG tablet TAKE 1 TABLET BY MOUTH DAILY (Patient taking differently: Take 40 mg by mouth daily. ) 30 tablet 5   No current facility-administered medications for this encounter.     Allergies:   Terazosin   Social History:  The patient  reports that he has never smoked. He has never used smokeless tobacco. He reports current alcohol use. He reports that he does not use drugs.   Family History:  The patient's  family history includes Heart attack in his mother; Hypertension in his mother; Stroke in his mother and another family member.    ROS:  Please see the history of present illness.   All other systems are personally reviewed and negative.   Recent Labs: 01/11/2018: TSH 1.970 01/14/2018: B Natriuretic Peptide 17.8 10/28/2018: ALT 19; Magnesium 2.3 11/13/2018: BUN 20; Creatinine, Ser 1.59; Hemoglobin 12.2; Platelets 232; Potassium 4.4; Sodium 137  personally reviewed    Other studies personally reviewed: Additional studies/ records that were reviewed today include: Epic notes, echocardiogram  Echo 01/15/18 - Left ventricle: The cavity size was normal. There was mild   concentric hypertrophy. Systolic function was normal. The   estimated ejection fraction was in the range of 60% to 65%. Wall   motion was normal; there were no regional wall motion   abnormalities. Doppler parameters are consistent with abnormal    left ventricular relaxation (grade 1 diastolic dysfunction). - Aortic valve: Trileaflet; moderately thickened, moderately   calcified leaflets, predominantly non-coronary leaflet. Valve   mobility was restricted. Sclerosis without stenosis. There was no   regurgitation. - Aortic root: The aortic root was normal in size. - Mitral valve: There was trivial regurgitation. - Left atrium: The atrium was mildly dilated. - Right ventricle: The cavity size was normal. Wall thickness was   normal. Systolic function was normal. - Right atrium: The atrium was normal in size. - Tricuspid valve: There was trivial regurgitation. - Pulmonic valve: There was trivial regurgitation. - Pulmonary arteries: Systolic pressure was within the normal   range. - Inferior vena cava: The vessel was normal in size. - Pericardium, extracardiac: There was no pericardial effusion.    ASSESSMENT AND PLAN:  1. Paroxysmal atrial fibrillation Patient has known history of paroxysmal atrial fibrillation. On Eliquis 5 mg  BID. His metoprolol was recently increased, patient tolerating higher dose. Would continue Toprol 25 mg daily Start diltiazem 30 mg PRN q 4hrs for heart racing. Would not consider AAD at this time given potentially reversible cause (steroid). Recent echo unremarkable. Recheck Bmet to evaluate renal function. If Cr remains >1.5, will need to reduce Eliquis dose to 2.5 mg BID.  This patients CHA2DS2-VASc Score and unadjusted Ischemic Stroke Rate (% per year) is equal to 4.8 % stroke rate/year from a score of 4  Above score calculated as 1 point each if present [CHF, HTN, DM, Vascular=MI/PAD/Aortic Plaque, Age if 65-74, or Male] Above score calculated as 2 points each if present [Age > 75, or Stroke/TIA/TE]  2. CAD No anginal symptoms. Continue present therapy and risk factor modification.  3. HTN Stable no changes today  COVID screen The patient does not have any symptoms that suggest any further  testing/ screening at this time.  Social distancing reinforced today.    Follow-up with Dr Fletcher Anon as scheduled. AF clinic in 3 months.  Current medicines are reviewed at length with the patient today.   The patient does not have concerns regarding his medicines.  The following changes were made today:  Start diltiazem 30 mg PRN  Labs/ tests ordered today include:  No orders of the defined types were placed in this encounter.   Patient Risk:  after full review of this patients clinical status, I feel that they are at moderate risk at this time.   Today, I have spent 25 minutes with the patient with telehealth technology discussing atrial fibrillation, medications, and COVID-19 precautions.    Gwenlyn Perking PA-C 11/14/2018 2:50 PM  [facility] [address] [place], [place] [address] [phone]   I hereby voluntarily request, consent and authorize the [facility] and its employed or contracted physicians, physician assistants, nurse practitioners or other licensed health care professionals (the Practitioner), to provide me with telemedicine health care services (the "Services") as deemed necessary by the treating Practitioner. I acknowledge and consent to receive the Services by the Practitioner via telemedicine. I understand that the telemedicine visit will involve communicating with the Practitioner through live audiovisual communication technology and the disclosure of certain medical information by electronic transmission. I acknowledge that I have been given the opportunity to request an in-person assessment or other available alternative prior to the telemedicine visit and am voluntarily participating in the telemedicine visit.   I understand that I have the right to withhold or withdraw my consent to the use of telemedicine in the course of my care at any time, without affecting my right to future care or treatment, and that the  Practitioner or I may terminate the telemedicine visit at any time. I understand that I have the right to inspect all information obtained and/or recorded in the course of the telemedicine visit and may receive copies of available information for a reasonable fee.  I understand that some of the potential risks of receiving the Services via telemedicine include:   Delay or interruption in medical evaluation due to technological equipment failure or disruption;  Information transmitted may not be sufficient (e.g. poor resolution of images) to allow for appropriate medical decision making by the Practitioner; and/or  In rare instances, security protocols could fail, causing a breach of personal health information.   Furthermore, I acknowledge that it is my responsibility to provide information about my medical history, conditions and care that is complete and accurate to  the best of my ability. I acknowledge that Practitioner's advice, recommendations, and/or decision may be based on factors not within their control, such as incomplete or inaccurate data provided by me or distortions of diagnostic images or specimens that may result from electronic transmissions. I understand that the practice of medicine is not an exact science and that Practitioner makes no warranties or guarantees regarding treatment outcomes. I acknowledge that I will receive a copy of this consent concurrently upon execution via email to the email address I last provided but may also request a printed copy by calling the office of the [facility].  I understand that my insurance will be billed for this visit.   I have read or had this consent read to me.  I understand the contents of this consent, which adequately explains the benefits and risks of the Services being provided via telemedicine.  I have been provided ample opportunity to ask questions regarding this consent and the Services and have had my questions  answered to my satisfaction.  I give my informed consent for the services to be provided through the use of telemedicine in my medical care  By participating in this telemedicine visit I agree to the above.

## 2018-11-15 ENCOUNTER — Other Ambulatory Visit: Payer: Self-pay

## 2018-11-15 ENCOUNTER — Telehealth (INDEPENDENT_AMBULATORY_CARE_PROVIDER_SITE_OTHER): Payer: PPO | Admitting: Cardiovascular Disease

## 2018-11-15 ENCOUNTER — Encounter: Payer: Self-pay | Admitting: Cardiovascular Disease

## 2018-11-15 VITALS — BP 145/113 | HR 60 | Ht 70.0 in | Wt 205.0 lb

## 2018-11-15 DIAGNOSIS — I480 Paroxysmal atrial fibrillation: Secondary | ICD-10-CM

## 2018-11-15 NOTE — Progress Notes (Signed)
Virtual Visit via Video Note   This visit type was conducted due to national recommendations for restrictions regarding the COVID-19 Pandemic (e.g. social distancing) in an effort to limit this patient's exposure and mitigate transmission in our community.  Due to his co-morbid illnesses, this patient is at least at moderate risk for complications without adequate follow up.  This format is felt to be most appropriate for this patient at this time.  All issues noted in this document were discussed and addressed.  A limited physical exam was performed with this format.  Please refer to the patient's chart for his consent to telehealth for [facility].   This started as a video visit but the patient could not connect.  Evaluation Performed:  Follow-up visit  Date:  11/15/2018   ID:  Benjamin Brewer, Benjamin Brewer 1930/12/12, MRN [id number]  Patient Location: Home Provider Location: Office  PCP:  Derinda Late, MD  Cardiologist:  Kathlyn Sacramento, MD  Electrophysiologist:  None   Chief Complaint: Follow-up visit  History of Present Illness:    Benjamin Brewer is a 85 y.o. male .  We again attempted video calling but he could not connect and had to switch to a phone call.  He has known history of coronary artery disease status post angioplasty and drug-eluting stent placement to the LAD and RCA in 2009.  He was diagnosed with atrial fibrillation with rapid ventricular response in 2015 and has been on anticoagulation since then.  Echocardiogram in July 2019 showed EF of 60 to 65%, calcified aortic valve without significant stenosis, mildly dilated left atrium and no significant pulmonary hypertension.  He had recent emergency room visits for severe numbness in both arms worse on the left side with some dizziness.  He ruled out for stroke.  MRI showed severe foraminal narrowing bilaterally at C4-5 worse on the left side.  He was treated with prednisone which increased his palpitations and atrial  fibrillation.   He went to the emergency room 2 days ago for palpitations and was noted to be in atrial fibrillation with rapid ventricular response.  He converted to sinus rhythm with IV diltiazem.  He was prescribed oral diltiazem by the A. fib clinic yesterday to be used as needed but has not started it.  He has not had recurrent atrial fibrillation.  No chest pain or shortness of breath.  He is going to have repeat MRI to evaluate his cervical spine stenosis.  The patient does not have symptoms concerning for COVID-19 infection (fever, chills, cough, or new shortness of breath).    Past Medical History:  Diagnosis Date  . A-fib ([place])   . Benign prostatic hypertrophy    Hx of   . CAD (coronary artery disease)    native vessel. Status post LAD and RCA angioplasty and drug-eluting stent placement in 2009  . Dyslipidemia   . History of tachycardia   . Hyperlipidemia   . Hypertension   . Intermediate coronary syndrome (HCC)    angina, unstable  . Other dyspnea and respiratory abnormality    on exertion   Past Surgical History:  Procedure Laterality Date  . [place]; x2  . [place]  . ESOPHAGOGASTRODUODENOSCOPY (EGD) WITH PROPOFOL N/A 06/10/2015   Procedure: ESOPHAGOGASTRODUODENOSCOPY (EGD) WITH PROPOFOL;  Surgeon: Manya Silvas, MD;  Location: [facility] ENDOSCOPY;  Service: Endoscopy;  Laterality: N/A;  .  HERNIA REPAIR    . SAVORY DILATION N/A 06/10/2015   Procedure: SAVORY DILATION;  Surgeon: Manya Silvas, MD;  Location: [facility] ENDOSCOPY;  Service: Endoscopy;  Laterality: N/A;  . TONSILLECTOMY       Current Meds  Medication Sig  . atorvastatin (LIPITOR) 40 MG tablet TAKE 1 TABLET BY MOUTH EVERY MORNING (Patient taking differently: Take 40 mg by mouth daily. )  . cyanocobalamin 500 MCG tablet Take 500 mcg by mouth daily.  Marland Kitchen ELIQUIS 5 MG TABS tablet TAKE 1 TABLET BY MOUTH TWICE  DAILY (Patient taking differently: Take 5 mg by mouth 2 (two) times daily. )  . ferrous sulfate (FERRO-BOB) 325 (65 FE) MG tablet Take 325 mg by mouth daily.    . finasteride (PROSCAR) 5 MG tablet Take 5 mg by mouth daily.  . furosemide (LASIX) 20 MG tablet Take 1 tablet (20 mg total) by mouth daily as needed for fluid or edema.  Marland Kitchen losartan (COZAAR) 25 MG tablet Take 1 tablet (25 mg total) by mouth daily.  . metoprolol succinate (TOPROL-XL) 25 MG 24 hr tablet Take 1 tablet (25 mg total) by mouth daily. (Patient taking differently: Take 12.5 mg by mouth daily. )  . Multiple Vitamins-Minerals (EYE VITAMINS PO) Take 1 tablet by mouth 2 (two) times daily.   . nitroGLYCERIN (NITROSTAT) 0.4 MG SL tablet place 1 tablet under the tongue if needed every 5 minutes fo (Patient taking differently: Place 0.4 mg under the tongue every 5 (five) minutes as needed for chest pain. )  . pantoprazole (PROTONIX) 40 MG tablet TAKE 1 TABLET BY MOUTH DAILY (Patient taking differently: Take 40 mg by mouth daily. )     Allergies:   Terazosin   Social History   Tobacco Use  . Smoking status: Never Smoker  . Smokeless tobacco: Never Used  Substance Use Topics  . Alcohol use: Yes    Comment: occasional   . Drug use: No     Family Hx: The patient's family history includes Heart attack in his mother; Hypertension in his mother; Stroke in his mother and another family member.  ROS:   Please see the history of present illness.     All other systems reviewed and are negative.   Prior CV studies:   The following studies were reviewed today:  I reviewed echocardiogram with him done in last year  Labs/Other Tests and Data Reviewed:    EKG: Recent EKG from the emergency room showed atrial fibrillation with RVR but then converted to sinus rhythm.  Recent Labs: 01/11/2018: TSH 1.970 01/14/2018: B Natriuretic Peptide 17.8 10/28/2018: ALT 19; Magnesium 2.3 11/13/2018: BUN 20; Creatinine, Ser 1.59; Hemoglobin 12.2;  Platelets 232; Potassium 4.4; Sodium 137   Recent Lipid Panel Lab Results  Component Value Date/Time   CHOL 115 10/08/2012 05:54 AM   TRIG 75 10/08/2012 05:54 AM   HDL 43 10/08/2012 05:54 AM   LDLCALC 57 10/08/2012 05:54 AM    Wt Readings from Last 3 Encounters:  11/15/18 205 lb (93 kg)  11/13/18 205 lb (93 kg)  11/02/18 206 lb (93.4 kg)     Objective:    Vital Signs:  BP (!) 145/113   Pulse 60   Ht 5\' 10"  (1.778 m)   Wt 205 lb (93 kg)   BMI 29.41 kg/m      ASSESSMENT & PLAN:    1.  Coronary artery disease involving native coronary arteries without angina:  Continue medical therapy. He is not on antiplatelet medications  given that he is on long-term anticoagulation.   He reports very rare episodes of chest discomfort and overall he has been stable.  No need for further ischemic work-up.  2. Paroxysmal atrial fibrillation:  Recent worsening of A. fib burden thought to be due to treatment with prednisone.  He is off prednisone.  Toprol was increased to 25 mg once daily.  I agree with the addition of oral diltiazem to be used as needed.   Continue anticoagulation with Eliquis.  Recent creatinine was 1.58 but normally his creatinine is below 1.4.  Thus, I am going to keep him on the same dose of Eliquis but we will have to monitor his renal function closely. If atrial fibrillation becomes more frequent, he might need an antiarrhythmic medication such as amiodarone.    3. Essential hypertension: Blood pressure was initially elevated but then when he repeated it it was 126/83.  Continue same medications for now.  4. Hyperlipidemia: Continue treatment with atorvastatin .   5. Severe cervical spine stenosis:If surgery is needed, the patient is considered at low risk from a cardiac standpoint.  He will need to hold Eliquis 3 days before surgery.   COVID-19 Education: The signs and symptoms of COVID-19 were discussed with the patient and how to seek care for testing (follow up  with PCP or arrange E-visit).  The importance of social distancing was discussed today.  Time:   Today, I have spent 22 minutes with the patient with telehealth technology discussing the above problems.     Medication Adjustments/Labs and Tests Ordered: Current medicines are reviewed at length with the patient today.  Concerns regarding medicines are outlined above.   Tests Ordered: No orders of the defined types were placed in this encounter.   Medication Changes: No orders of the defined types were placed in this encounter.   Disposition:  Follow up in 4 month(s)  Signed, Kathlyn Sacramento, MD  11/15/2018 12:15 PM    [place]

## 2018-11-15 NOTE — Patient Instructions (Signed)
Medication Instructions:  Continue same medications. If you need a refill on your cardiac medications before your next appointment, please call your pharmacy.   Lab work: None If you have labs (blood work) drawn today and your tests are completely normal, you will receive your results only by: Marland Kitchen MyChart Message (if you have MyChart) OR . A paper copy in the mail If you have any lab test that is abnormal or we need to change your treatment, we will call you to review the results.  Testing/Procedures: None  Follow-Up: Office follow-up visit in 2 months

## 2018-11-17 ENCOUNTER — Other Ambulatory Visit: Payer: Self-pay | Admitting: Neurological Surgery

## 2018-11-17 DIAGNOSIS — I5032 Chronic diastolic (congestive) heart failure: Secondary | ICD-10-CM | POA: Diagnosis not present

## 2018-11-17 DIAGNOSIS — N183 Chronic kidney disease, stage 3 (moderate): Secondary | ICD-10-CM | POA: Diagnosis not present

## 2018-11-17 DIAGNOSIS — Z125 Encounter for screening for malignant neoplasm of prostate: Secondary | ICD-10-CM | POA: Diagnosis not present

## 2018-11-17 DIAGNOSIS — I2510 Atherosclerotic heart disease of native coronary artery without angina pectoris: Secondary | ICD-10-CM | POA: Diagnosis not present

## 2018-11-17 DIAGNOSIS — Z79899 Other long term (current) drug therapy: Secondary | ICD-10-CM | POA: Diagnosis not present

## 2018-11-17 DIAGNOSIS — E7800 Pure hypercholesterolemia, unspecified: Secondary | ICD-10-CM | POA: Diagnosis not present

## 2018-11-17 DIAGNOSIS — I10 Essential (primary) hypertension: Secondary | ICD-10-CM | POA: Diagnosis not present

## 2018-11-17 DIAGNOSIS — I480 Paroxysmal atrial fibrillation: Secondary | ICD-10-CM | POA: Diagnosis not present

## 2018-11-17 DIAGNOSIS — R42 Dizziness and giddiness: Secondary | ICD-10-CM

## 2018-11-18 ENCOUNTER — Other Ambulatory Visit: Payer: Self-pay | Admitting: Neurological Surgery

## 2018-11-18 ENCOUNTER — Other Ambulatory Visit: Payer: Self-pay

## 2018-11-18 ENCOUNTER — Ambulatory Visit
Admission: RE | Admit: 2018-11-18 | Discharge: 2018-11-18 | Disposition: A | Discharge status: Home / Self Care | Payer: PPO | Source: Ambulatory Visit | Attending: Neurological Surgery | Admitting: Neurological Surgery

## 2018-11-18 DIAGNOSIS — R42 Dizziness and giddiness: Secondary | ICD-10-CM | POA: Diagnosis not present

## 2018-11-18 DIAGNOSIS — R930 Abnormal findings on diagnostic imaging of skull and head, not elsewhere classified: Secondary | ICD-10-CM | POA: Insufficient documentation

## 2018-11-18 DIAGNOSIS — R200 Anesthesia of skin: Secondary | ICD-10-CM | POA: Diagnosis not present

## 2018-11-18 MED ORDER — GADOBUTROL 1 MMOL/ML IV SOLN
9.0000 mL | Freq: Once | INTRAVENOUS | Status: AC | PRN
  Administered 2018-11-18: 19:00:00 9 mL via INTRAVENOUS

## 2018-11-21 ENCOUNTER — Other Ambulatory Visit (HOSPITAL_COMMUNITY): Payer: Self-pay | Admitting: Physician Assistant

## 2018-11-22 DIAGNOSIS — H353122 Nonexudative age-related macular degeneration, left eye, intermediate dry stage: Secondary | ICD-10-CM | POA: Diagnosis not present

## 2018-11-30 DIAGNOSIS — R200 Anesthesia of skin: Secondary | ICD-10-CM | POA: Diagnosis not present

## 2018-12-02 ENCOUNTER — Other Ambulatory Visit: Payer: Self-pay | Admitting: Physician Assistant

## 2018-12-21 ENCOUNTER — Other Ambulatory Visit (HOSPITAL_COMMUNITY): Payer: Self-pay | Admitting: Physician Assistant

## 2018-12-29 ENCOUNTER — Other Ambulatory Visit (HOSPITAL_COMMUNITY): Payer: Self-pay | Admitting: Physician Assistant

## 2019-01-05 ENCOUNTER — Encounter: Payer: Self-pay | Admitting: *Deleted

## 2019-01-07 ENCOUNTER — Other Ambulatory Visit (HOSPITAL_COMMUNITY): Payer: Self-pay | Admitting: Physician Assistant

## 2019-01-09 ENCOUNTER — Ambulatory Visit (INDEPENDENT_AMBULATORY_CARE_PROVIDER_SITE_OTHER): Payer: PPO | Admitting: Neurology

## 2019-01-09 ENCOUNTER — Other Ambulatory Visit: Payer: Self-pay

## 2019-01-09 ENCOUNTER — Encounter: Payer: Self-pay | Admitting: Neurology

## 2019-01-09 VITALS — BP 135/74 | HR 54 | Temp 98.4°F | Ht 70.0 in | Wt 205.0 lb

## 2019-01-09 DIAGNOSIS — R202 Paresthesia of skin: Secondary | ICD-10-CM | POA: Diagnosis not present

## 2019-01-09 NOTE — Progress Notes (Signed)
[facility]    Provider:  Dr Jaynee Eagles Requesting Provider: Deatra Ina, MD  Primary Care Provider:  Derinda Late, MD  CC:  Numbness/tingling in toes  HPI:  Benjamin Brewer is a 85 y.o. male here as requested by Dr. Venetia Constable for bilateral numbness in the feet. PMHx afib, BPH, CAD, HLD, HTN, CKD. Generalized weakness. He has numbness in the feet. Improved. Numbness started with dizziness. He woke up and was very dizzy. He felt his left side was numb and he went to [facility] and he had an evaluation, MR of the brain was completed, no stroke, prednisone put him into afib and he went to the ED in May 2020 for the afib. The numbness started below the knees all at once. Improved but not as much. He used to go to the gym 5 days a week. Since then he has equipment in his office and he feels better since he started exercising. Dizziness resolved. He has numbness in the feet tingly. He has chronic low back pain some soreness but no radiation or radiculopathy. No symptoms of claudication. His primary care has not completed any labwork. His numbness made it difficult to walk, he did not know where is feet were stumbly. No other focal neurologic deficits, associated symptoms, inciting events or modifiable factors. No pain. Now he just as some prickly feeling in his toes.   Reviewed notes, labs and imaging from outside physicians, which showed:  5/20 CBC with anemia 12.2,37.3 Bmp elevated glu 131, creat 1.59  I do not have primary care notes, I will request those from PCP.  I reviewed notes from Dr. Lenon Ahmadi GERD.  Patient had EMG as well as MRI with MRV performed.  His chief complaint is bilateral lower extremity numbness in his feet and underneath him.  He has stable dizziness.  Mild numbness in his hands on the right but similarly not as bothersome as his leg syndromes.  Patient is on Eliquis, iron, Lasix, Lipitor, losartan, meclizine, metoprolol, Protonix and trazodone.  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  No enhancement of the vestibulocochlear nerve or labyrinth.  EMG shows bilateral median neuropathy at the level of the carpal tunnel severe.  Clinically symptoms of numbness in a stocking glove distribution with unstable gait is the main presenting complaint.  Electrodiagnostic studies show severe bilateral carpal tunnel syndrome that could explain his upper extremity symptoms.  His bilateral lower extremity complaints do not pertain to a particular dermatome and do not have a claudication-like pattern.  Also MRI of the cervical spine at C3-C4 there is spontaneous ankylosis both anterior and posteriorly with moderate left-sided foraminal stenosis.  At C4-C5 there is bilateral severe foraminal stenosis.  At C5-C6 there is moderate bilateral foraminal stenosis.  At C6-C7 there is moderate bilateral foraminal stenosis.  I reviewed electrodiagnostic report.  This was completed of the upper extremities bilaterally with severe bilateral median neuropathy at the carpal tunnel.  Electrodiagnostic study of the lower extremities was within normal limits with no evidence for lumbar radiculopathy, plexopathy, mononeuropathy or polyneuropathy.  Personally reviewed images of MRI brain and agree with above.  Review of Systems: Patient complains of symptoms per HPI as well as the following symptoms: numbness. Pertinent negatives and positives per HPI. All others negative.   Social History   Socioeconomic History   Marital status: Widowed    Spouse name: Not on file   Number of children:  1   Years of education: Not on file   Highest education level: Bachelor's degree (e.g., BA, AB, BS)  Occupational History   Not on file  Social Needs   Financial resource strain: Not on file   Food insecurity    Worry: Not on file    Inability: Not on file    Transportation needs    Medical: Not on file    Non-medical: Not on file  Tobacco Use   Smoking status: Never Smoker   Smokeless tobacco: Never Used  Substance and Sexual Activity   Alcohol use: Yes    Comment: occasional    Drug use: No   Sexual activity: Not on file  Lifestyle   Physical activity    Days per week: Not on file    Minutes per session: Not on file   Stress: Not on file  Relationships   Social connections    Talks on phone: Not on file    Gets together: Not on file    Attends religious service: Not on file    Active member of club or organization: Not on file    Attends meetings of clubs or organizations: Not on file    Relationship status: Not on file   Intimate partner violence    Fear of current or ex partner: Not on file    Emotionally abused: Not on file    Physically abused: Not on file    Forced sexual activity: Not on file  Other Topics Concern   Not on file  Social History Narrative   Widowed retired 12/95. Gets regular exercise.       Lives at home alone   Right handed    Family History  Problem Relation Age of Onset   Stroke Mother    Heart attack Mother    Hypertension Mother    Stroke Other        family history    Past Medical History:  Diagnosis Date   A-fib [facility])    Benign prostatic hypertrophy    Hx of    CAD (coronary artery disease)    native vessel. Status post LAD and RCA angioplasty and drug-eluting stent placement in 2009   Dizziness    Dyslipidemia    History of tachycardia    Hyperlipidemia    Hypertension    Intermediate coronary syndrome (HCC)    angina, unstable   Numbness and tingling of both lower extremities    Other dyspnea and respiratory abnormality    on exertion    Patient Active Problem List   Diagnosis Date Noted   Paresthesia of both feet 01/09/2019   Acute renal failure superimposed on stage 3 chronic kidney disease ([place]) 01/14/2018   Left arm numbness 01/14/2018     Generalized weakness 01/14/2018   Left shoulder pain    Atrial fibrillation ([place]) 08/20/2014   BPPV (benign paroxysmal positional vertigo) 10/23/2013   Sinus bradycardia 06/16/2013   Essential hypertension 06/16/2013   CAD, NATIVE VESSEL 07/03/2009   DYSPNEA ON EXERTION 12/21/2008   Hyperlipidemia 12/15/2008   BENIGN PROSTATIC HYPERTROPHY, HX OF 12/15/2008    Past Surgical History:  Procedure Laterality Date   [place]; x2   [place]   ESOPHAGOGASTRODUODENOSCOPY (EGD) WITH PROPOFOL N/A 06/10/2015   Procedure: ESOPHAGOGASTRODUODENOSCOPY (EGD) WITH PROPOFOL;  Surgeon: Manya Silvas, MD;  Location: [facility] ENDOSCOPY;  Service:  Endoscopy;  Laterality: N/A;   HERNIA REPAIR     SAVORY DILATION N/A 06/10/2015   Procedure: SAVORY DILATION;  Surgeon: Manya Silvas, MD;  Location: [facility] ENDOSCOPY;  Service: Endoscopy;  Laterality: N/A;   TONSILLECTOMY      Current Outpatient Medications  Medication Sig Dispense Refill   atorvastatin (LIPITOR) 40 MG tablet TAKE 1 TABLET BY MOUTH EVERY MORNING (Patient taking differently: Take 40 mg by mouth daily. ) 90 tablet 2   cyanocobalamin 500 MCG tablet Take 500 mcg by mouth daily.     ELIQUIS 5 MG TABS tablet TAKE 1 TABLET BY MOUTH TWICE DAILY (Patient taking differently: Take 5 mg by mouth 2 (two) times daily. ) 60 tablet 6   ferrous sulfate (FERRO-BOB) 325 (65 FE) MG tablet Take 325 mg by mouth daily.       finasteride (PROSCAR) 5 MG tablet Take 5 mg by mouth daily.     losartan (COZAAR) 25 MG tablet Take 1 tablet (25 mg total) by mouth daily. 90 tablet 3   metoprolol succinate (TOPROL-XL) 25 MG 24 hr tablet Take 1 tablet (25 mg total) by mouth daily. 90 tablet 2   Multiple Vitamins-Minerals (EYE VITAMINS PO) Take 1 tablet by mouth 2 (two) times daily.      pantoprazole (PROTONIX) 40 MG tablet TAKE 1 TABLET BY MOUTH DAILY  (Patient taking differently: Take 40 mg by mouth daily. ) 30 tablet 5   diltiazem (CARDIZEM) 30 MG tablet TAKE ONE TABLET EVERY 4 HOURS AS NEEDED FOR AFIB HEART RATE >100 45 tablet 1   furosemide (LASIX) 20 MG tablet Take 1 tablet (20 mg total) by mouth daily as needed for fluid or edema. (Patient not taking: Reported on 01/09/2019) 30 tablet 0   nitroGLYCERIN (NITROSTAT) 0.4 MG SL tablet place 1 tablet under the tongue if needed every 5 minutes fo (Patient taking differently: Place 0.4 mg under the tongue every 5 (five) minutes as needed for chest pain. ) 25 tablet 3   No current facility-administered medications for this visit.     Allergies as of 01/09/2019 - Review Complete 01/09/2019  Allergen Reaction Noted   Prednisone  01/09/2019   Terazosin Other (See Comments) 08/20/2014    Vitals: BP 135/74 (BP Location: Right Arm, Patient Position: Sitting)    Pulse (!) 54    Temp 98.4 F (36.9 C)    Ht _0  (1.778 m)    Wt 205 lb (93 kg)    BMI 29.41 kg/m  Last Weight:  Wt Readings from Last 1 Encounters:  01/09/19 205 lb (93 kg)   Last Height:   Ht Readings from Last 1 Encounters:  01/09/19 _1  (1.778 m)     Physical exam: Exam: Gen: NAD, conversant, well nourised, well groomed                     CV: RRR, no MRG. No Carotid Bruits. No peripheral edema, warm, nontender Eyes: Conjunctivae clear without exudates or hemorrhage  Neuro: Detailed Neurologic Exam  Speech:    Speech is normal; fluent and spontaneous with normal comprehension.  Cognition:    The patient is oriented to person, place, and time;     recent and remote memory intact;     language fluent;     normal attention, concentration,     fund of knowledge Cranial Nerves:    The pupils are equal, round, and reactive to light. Attempted fundoscopy could not visualize. [place] fields are  full to finger confrontation. Extraocular movements are intact. Trigeminal sensation is intact and the muscles of  mastication are normal. The face is symmetric. The palate elevates in the midline. Hearing intact. Voice is normal. Shoulder shrug is normal. The tongue has normal motion without fasciculations.   Coordination:    Normal finger to nose and heel to shin.   Gait:    Heel-toe and tandem gait are intact with mild imbalance with tandem  Motor Observation:    No asymmetry, no atrophy, and no involuntary movements noted. Tone:    Normal muscle tone.    Posture:    Posture is slightly stooped    Strength: Minimal left prox LE weakness otherwise strength is V/V in the upper and lower limbs.      Sensation: intact to LT, pin prick, temp, vibration (12 secs great toes), proprioception.      Reflex Exam:  DTR's:   Absent AJs.    Toes:    The toes are equiv bilaterally.   Clonus:    Clonus is absent.    Assessment/Plan: 85 y.o. male here as requested by Dr. Venetia Constable for bilateral numbness in the feet. PMHx afib, BPH, CAD, HLD, HTN, CKD. Generalized weakness. He has numbness in the feet. Improved. Numbness started with dizziness. He woke up and was very dizzy.  Interesting acute episode of vertigo and numbness. Prednisone helped vertigo, possibly viral infection of the URI causing labyrinthitis and mild GBS. Improved significantly, neuro exam unremarkable including completely normal sensory exam. Almost resolved, will check several other labs. RTC as needed.  Orders Placed This Encounter  Procedures   Hemoglobin A1c   Vitamin B1   Vitamin B6   B. burgdorfi Antibody   Methylmalonic acid, serum   TSH   B12 and Folate Panel   Heavy metals, blood   Multiple Myeloma Panel (SPEP&IFE w/QIG)   Comprehensive metabolic panel   CBC     Cc: Derinda Late, MD,  Marella Chimes MD  Sarina Ill, MD  [facility] [address] [place], [place] [address]  Phone [phone] Fax [phone]

## 2019-01-09 NOTE — Patient Instructions (Signed)

## 2019-01-10 ENCOUNTER — Telehealth: Payer: Self-pay | Admitting: Neurology

## 2019-01-10 LAB — MULTIPLE MYELOMA PANEL, SERUM

## 2019-01-10 NOTE — Telephone Encounter (Signed)
error 

## 2019-01-13 LAB — COMPREHENSIVE METABOLIC PANEL
ALT: 15 IU/L (ref 0–44)
AST: 19 IU/L (ref 0–40)
Albumin/Globulin Ratio: 2.4 — ABNORMAL HIGH (ref 1.2–2.2)
Albumin: 4.4 g/dL (ref 3.6–4.6)
Alkaline Phosphatase: 79 IU/L (ref 39–117)
BUN/Creatinine Ratio: 20 (ref 10–24)
BUN: 24 mg/dL (ref 8–27)
Bilirubin Total: 0.3 mg/dL (ref 0.0–1.2)
CO2: 23 mmol/L (ref 20–29)
Calcium: 9.6 mg/dL (ref 8.6–10.2)
Chloride: 103 mmol/L (ref 96–106)
Creatinine, Ser: 1.2 mg/dL (ref 0.76–1.27)
GFR calc Af Amer: 63 mL/min/{1.73_m2} (ref >59)
GFR calc non Af Amer: 54 mL/min/{1.73_m2} — ABNORMAL LOW (ref >59)
Globulin, Total: 1.8 g/dL (ref 1.5–4.5)
Glucose: 97 mg/dL (ref 65–99)
Potassium: 4.5 mmol/L (ref 3.5–5.2)
Sodium: 142 mmol/L (ref 134–144)
Total Protein: 6.2 g/dL (ref 6.0–8.5)

## 2019-01-13 LAB — MULTIPLE MYELOMA PANEL, SERUM
Albumin SerPl Elph-Mcnc: 3.9 g/dL (ref 2.9–4.4)
Albumin/Glob SerPl: 1.7 (ref 0.7–1.7)
Alpha 1: 0.2 g/dL (ref 0.0–0.4)
Alpha2 Glob SerPl Elph-Mcnc: 0.8 g/dL (ref 0.4–1.0)
B-Globulin SerPl Elph-Mcnc: 0.9 g/dL (ref 0.7–1.3)
Gamma Glob SerPl Elph-Mcnc: 0.5 g/dL (ref 0.4–1.8)
Globulin, Total: 2.3 g/dL (ref 2.2–3.9)
IgA/Immunoglobulin A, Serum: 174 mg/dL (ref 61–437)
IgG (Immunoglobin G), Serum: 610 mg/dL (ref 603–1613)
IgM (Immunoglobulin M), Srm: 43 mg/dL (ref 15–143)

## 2019-01-13 LAB — HEAVY METALS, BLOOD
Arsenic: 8 ug/L (ref 2–23)
Lead, Blood: NOT DETECTED ug/dL (ref 0–4)
Mercury: NOT DETECTED ug/L (ref 0.0–14.9)

## 2019-01-13 LAB — METHYLMALONIC ACID, SERUM: Methylmalonic Acid: 150 nmol/L (ref 0–378)

## 2019-01-13 LAB — HEMOGLOBIN A1C
Est. average glucose Bld gHb Est-mCnc: 114 mg/dL
Hgb A1c MFr Bld: 5.6 % (ref 4.8–5.6)

## 2019-01-13 LAB — CBC
Hematocrit: 35.1 % — ABNORMAL LOW (ref 37.5–51.0)
Hemoglobin: 12 g/dL — ABNORMAL LOW (ref 13.0–17.7)
MCH: 30.5 pg (ref 26.6–33.0)
MCHC: 34.2 g/dL (ref 31.5–35.7)
MCV: 89 fL (ref 79–97)
Platelets: 237 10*3/uL (ref 150–450)
RBC: 3.93 x10E6/uL — ABNORMAL LOW (ref 4.14–5.80)
RDW: 12.3 % (ref 11.6–15.4)
WBC: 7 10*3/uL (ref 3.4–10.8)

## 2019-01-13 LAB — TSH: TSH: 2.99 u[IU]/mL (ref 0.450–4.500)

## 2019-01-13 LAB — VITAMIN B6: Vitamin B6: 10.6 ug/L (ref 5.3–46.7)

## 2019-01-13 LAB — VITAMIN B1: Thiamine: 113.1 nmol/L (ref 66.5–200.0)

## 2019-01-13 LAB — B12 AND FOLATE PANEL
Folate: 14.2 ng/mL (ref >3.0)
Vitamin B-12: 900 pg/mL (ref 232–1245)

## 2019-01-13 LAB — B. BURGDORFI ANTIBODIES: Lyme IgG/IgM Ab: <0.91 {ISR} (ref 0.00–0.90)

## 2019-01-22 NOTE — Progress Notes (Signed)
Cardiology Office Note Date:  01/24/2019  Patient ID:  Benjamin Brewer, Benjamin Brewer 02-Oct-1930, MRN [id number] PCP:  Derinda Late, MD  Cardiologist:  Dr. Fletcher Anon, MD    Chief Complaint: Follow-up  History of Present Illness: Benjamin Brewer is a 85 y.o. male with history of CADas outlined below, PAF on Eliquis, HTN, HLD, and anxietywho presents for follow upof  CAD and A. fib.  Patient underwent PCI/DES to the LAD and RCA in 10-12-07. Follow up nuclear stress testing in 2011/10/12 showed no evidence of ischemia with normal EF. In October 11, 2013, he had an episode of Afib with RVR and has been on Eliquis and beta blocker since. Echo in 12-Oct-2014 showed an EF of 55-60%, mild MR, and a mildly dilated right atrium.In follow up in 08/2017 he was doing well. Patient's wife passed away in 2017/10/11 and the patient has dealt with a significant amount of grief, anxiety, and depression since. He was seen in our office on 7/2 with increased ankle edema as well as palpitations and left arm paresthesias. Symptoms were not similar to his prior Afib. There was no chest pain. Weight was noted to be down 2 pounds at 210 from his office visit in 08/2017 when he weighed 212 pounds. He underwent 48-hour Holter monitorthat showedNSR with an average heart rate of 65 bpm and range from 43-164 bpm.The longest pause was 1.64 seconds.Thre were 55 PVCs with an isolated triplet. There were 783 PACs with a rare couplet and 1 run of atrial tach lasting 70 beats.Some of the patient's symptoms corresponded to PACs/PVCs. Following this, he was seen in the ED in 01/2018 with left arm and leg paresthesias with imaging including CT head and MRI of the brain being nonacute.  Echo in 01/2018 showed an EF of 60 to 65%, no regional wall motion abnormalities, grade 1 diastolic dysfunction, aortic sclerosis without stenosis, trivial mitral regurgitation, mildly dilated left atrium, RV systolic function normal, trivial TR/PR, PASP normal.  More recently, he was seen in  the ED twice in 10/2018 with dizziness and paresthesias of the left upper extremity without evidence of stroke.  MRI of the C-spine showed foraminal narrowing bilaterally at C4-5, worse on the left side.  He was treated with prednisone which increased his palpitations and A. fib.  He was seen in the ED on 11/13/2018 for palpitations and was noted to be in A. fib with RVR.  He converted to sinus rhythm with IV diltiazem.  He followed up at the A. fib clinic on 5/4 and was prescribed as needed oral diltiazem.  He was most recently seen in telemedicine follow-up on 11/15/2018 by his primary cardiologist and denied any recurrent A. fib.  He is subsequently undergone repeat MRI brain and MRV head without acute findings.  He has been followed by neurology for his dizziness and paresthesias which have been felt to be secondary to C-spine narrowing, vertigo, and carpal tunnel disease.  He comes in doing well from a cardiac perspective.  No chest pain, shortness of breath, palpitations, dizziness, presyncope, or syncope.  No lower extremity swelling, abdominal distention, orthopnea, PND, early satiety.  No falls, BRBPR, or melena.  He does state his upper extremity numbness is improved though continues to deal with paresthesias affecting the bilateral feet.  Since discontinuing prednisone he has not felt any further palpitations.  He does feel a little bit fatigued following titration of his metoprolol to 25 mg daily.  Otherwise, he is without complaints.  He reports compliance with his  anticoagulation.   Past Medical History:  Diagnosis Date   A-fib [facility])    Benign prostatic hypertrophy    Hx of    CAD (coronary artery disease)    native vessel. Status post LAD and RCA angioplasty and drug-eluting stent placement in 2009   Dizziness    Dyslipidemia    History of tachycardia    Hyperlipidemia    Hypertension    Intermediate coronary syndrome (HCC)    angina, unstable   Numbness and tingling of both  lower extremities    Other dyspnea and respiratory abnormality    on exertion    Past Surgical History:  Procedure Laterality Date   [place]; x2   [place]   ESOPHAGOGASTRODUODENOSCOPY (EGD) WITH PROPOFOL N/A 06/10/2015   Procedure: ESOPHAGOGASTRODUODENOSCOPY (EGD) WITH PROPOFOL;  Surgeon: Manya Silvas, MD;  Location: [facility] ENDOSCOPY;  Service: Endoscopy;  Laterality: N/A;   HERNIA REPAIR     SAVORY DILATION N/A 06/10/2015   Procedure: SAVORY DILATION;  Surgeon: Manya Silvas, MD;  Location[facility] ENDOSCOPY;  Service: Endoscopy;  Laterality: N/A;   TONSILLECTOMY      Current Meds  Medication Sig   atorvastatin (LIPITOR) 40 MG tablet TAKE 1 TABLET BY MOUTH EVERY MORNING (Patient taking differently: Take 40 mg by mouth daily. )   cyanocobalamin 500 MCG tablet Take 500 mcg by mouth daily.   diltiazem (CARDIZEM) 30 MG tablet TAKE ONE TABLET EVERY 4 HOURS AS NEEDED FOR AFIB HEART RATE >100   ELIQUIS 5 MG TABS tablet TAKE 1 TABLET BY MOUTH TWICE DAILY (Patient taking differently: Take 5 mg by mouth 2 (two) times daily. )   ferrous sulfate (FERRO-BOB) 325 (65 FE) MG tablet Take 325 mg by mouth daily.     finasteride (PROSCAR) 5 MG tablet Take 5 mg by mouth daily.   furosemide (LASIX) 20 MG tablet Take 1 tablet (20 mg total) by mouth daily as needed for fluid or edema.   losartan (COZAAR) 25 MG tablet Take 1 tablet (25 mg total) by mouth daily.   metoprolol succinate (TOPROL-XL) 25 MG 24 hr tablet Take 1 tablet (25 mg total) by mouth daily.   Multiple Vitamins-Minerals (EYE VITAMINS PO) Take 1 tablet by mouth 2 (two) times daily.    nitroGLYCERIN (NITROSTAT) 0.4 MG SL tablet place 1 tablet under the tongue if needed every 5 minutes fo (Patient taking differently: Place 0.4 mg under the tongue every 5 (five) minutes as needed for chest pain. )   pantoprazole (PROTONIX)  40 MG tablet TAKE 1 TABLET BY MOUTH DAILY (Patient taking differently: Take 40 mg by mouth daily. )    Allergies:   Prednisone and Terazosin   Social History:  The patient  reports that he has never smoked. He has never used smokeless tobacco. He reports current alcohol use. He reports that he does not use drugs.   Family History:  The patient's family history includes Heart attack in his mother; Hypertension in his mother; Stroke in his mother and another family member.  ROS:   Review of Systems  Constitutional: Positive for malaise/fatigue. Negative for chills, diaphoresis, fever and weight loss.  HENT: Negative for congestion.   Eyes: Negative for discharge and redness.  Respiratory: Negative for cough, hemoptysis, sputum production, shortness of breath and wheezing.   Cardiovascular: Negative for chest pain, palpitations, orthopnea, claudication, leg swelling and  PND.  Gastrointestinal: Negative for abdominal pain, blood in stool, heartburn, melena, nausea and vomiting.  Genitourinary: Negative for hematuria.  Musculoskeletal: Negative for falls and myalgias.  Skin: Negative for rash.  Neurological: Positive for sensory change. Negative for dizziness, tingling, tremors, speech change, focal weakness, loss of consciousness and weakness.       Bilateral feet  Endo/Heme/Allergies: Does not bruise/bleed easily.  Psychiatric/Behavioral: Negative for substance abuse. The patient is not nervous/anxious.   All other systems reviewed and are negative.    PHYSICAL EXAM:  VS:  BP 120/70 (BP Location: Left Arm, Patient Position: Sitting, Cuff Size: Normal)    Pulse (!) 50    Temp (!) 97.5 F (36.4 C)    Ht 5\' 10"  (1.778 m)    Wt 203 lb 12 oz (92.4 kg)    SpO2 97%    BMI 29.24 kg/m  BMI: Body mass index is 29.24 kg/m.  Physical Exam  Constitutional: He is oriented to person, place, and time. He appears well-developed and well-nourished.  HENT:  Head: Normocephalic and atraumatic.  Eyes:  Right eye exhibits no discharge. Left eye exhibits no discharge.  Neck: Normal range of motion. No JVD present.  Cardiovascular: Regular rhythm, S1 normal, S2 normal and normal heart sounds. Bradycardia present. Exam reveals no distant heart sounds, no friction rub, no midsystolic click and no opening snap.  No murmur heard. Pulses:      Posterior tibial pulses are 2+ on the right side and 2+ on the left side.  Pulmonary/Chest: Effort normal and breath sounds normal. No respiratory distress. He has no decreased breath sounds. He has no wheezes. He has no rales. He exhibits no tenderness.  Abdominal: Soft. He exhibits no distension. There is no abdominal tenderness.  Musculoskeletal:        General: No edema.  Neurological: He is alert and oriented to person, place, and time.  Skin: Skin is warm and dry. No cyanosis. Nails show no clubbing.  Psychiatric: He has a normal mood and affect. His speech is normal and behavior is normal. Judgment and thought content normal.     EKG:  Was ordered and interpreted by me today. Shows sinus bradycardia with sinus arrhythmia, 50 bpm, possible prior inferior infarct, no acute ST-T changes  Recent Labs: 10/28/2018: Magnesium 2.3 01/09/2019: ALT 15; BUN 24; Creatinine, Ser 1.20; Hemoglobin 12.0; Platelets 237; Potassium 4.5; Sodium 142; TSH 2.990  No results found for requested labs within last 8760 hours.   Estimated Creatinine Clearance: 50.5 mL/min (by C-G formula based on SCr of 1.2 mg/dL).   Wt Readings from Last 3 Encounters:  01/24/19 203 lb 12 oz (92.4 kg)  01/09/19 205 lb (93 kg)  11/15/18 205 lb (93 kg)     Other studies reviewed: Additional studies/records reviewed today include: summarized above  ASSESSMENT AND PLAN:  1. CAD involving the native coronary arteries without angina: He is doing well without any symptoms suggestive of angina.  He is on Eliquis in place of aspirin.  Continue low-dose Toprol-XL as outlined below along with  Lipitor.  Aggressive risk factor modification and secondary prevention.  No plans for ischemic evaluation at this time.  2. PAF: He is maintaining sinus rhythm with a bradycardic heart rate.  Decrease Toprol-XL to 12.5 mg daily given bradycardic heart rates and associated fatigue.  His Toprol-XL was recently titrated to 25 mg daily in 11/2018 secondary to increased palpitations with prednisone use.  If with the decrease of Toprol XL back down to  12.5 mg daily he notes increase in palpitations we may need to consider antiarrhythmic therapy and him moving forward.  Most recent CBC showed mild, though stable anemia with a hemoglobin of 12.1.  Most recent serum creatinine from 11/17/2018 stable at 1.2.  He will remain on Eliquis 5 mg twice daily as he does not meet reduced dosing criteria at this time.  3. Bradycardia: Fatigue noted.  Decrease Toprol-XL to 12.5 mg daily.  If fatigue persists with tapering of Toprol as above we may need to consider transitioning him to alternative beta-blocker such as Coreg.  4. Hypertension: Blood pressure is well controlled today.  Continue Toprol as above.  5. Hyperlipidemia: LDL from 11/2018 of 73 with normal liver function at that time.  Continue Lipitor 40 mg daily.  6. Anemia: Stable.  Followed by PCP.  Disposition: F/u with Dr. Fletcher Anon or an APP in 3 months.  Current medicines are reviewed at length with the patient today.  The patient did not have any concerns regarding medicines.  Signed, Christell Faith, PA-C 01/24/2019 10:01 AM     [facility] [place], [place] [id number] [phone]

## 2019-01-24 ENCOUNTER — Other Ambulatory Visit: Payer: Self-pay

## 2019-01-24 ENCOUNTER — Encounter: Payer: Self-pay | Admitting: Physician Assistant

## 2019-01-24 ENCOUNTER — Ambulatory Visit (INDEPENDENT_AMBULATORY_CARE_PROVIDER_SITE_OTHER): Payer: PPO | Admitting: Physician Assistant

## 2019-01-24 VITALS — BP 120/70 | HR 50 | Temp 97.5°F | Ht 70.0 in | Wt 203.8 lb

## 2019-01-24 DIAGNOSIS — D649 Anemia, unspecified: Secondary | ICD-10-CM | POA: Diagnosis not present

## 2019-01-24 DIAGNOSIS — R001 Bradycardia, unspecified: Secondary | ICD-10-CM | POA: Diagnosis not present

## 2019-01-24 DIAGNOSIS — I2510 Atherosclerotic heart disease of native coronary artery without angina pectoris: Secondary | ICD-10-CM | POA: Diagnosis not present

## 2019-01-24 DIAGNOSIS — I10 Essential (primary) hypertension: Secondary | ICD-10-CM | POA: Diagnosis not present

## 2019-01-24 DIAGNOSIS — E785 Hyperlipidemia, unspecified: Secondary | ICD-10-CM

## 2019-01-24 DIAGNOSIS — I480 Paroxysmal atrial fibrillation: Secondary | ICD-10-CM | POA: Diagnosis not present

## 2019-01-24 MED ORDER — METOPROLOL SUCCINATE ER 25 MG PO TB24: 12.5000 mg | ORAL_TABLET | Freq: Every day | ORAL | 3 refills | Status: DC

## 2019-01-24 NOTE — Patient Instructions (Signed)
Medication Instructions:  DECREASE: Metoprolol to 12.5 mg once a day   If you need a refill on your cardiac medications before your next appointment, please call your pharmacy.   Lab work: None   If you have labs (blood work) drawn today and your tests are completely normal, you will receive your results only by: Marland Kitchen MyChart Message (if you have MyChart) OR . A paper copy in the mail If you have any lab test that is abnormal or we need to change your treatment, we will call you to review the results.  Testing/Procedures: None  Follow-Up: At [facility], you and your health needs are our priority.  As part of our continuing mission to provide you with exceptional heart care, we have created designated Provider Care Teams.  These Care Teams include your primary Cardiologist (physician) and Advanced Practice Providers (APPs -  Physician Assistants and Nurse Practitioners) who all work together to provide you with the care you need, when you need it. You will need a follow up appointment in 3 months.  Please call our office 2 months in advance to schedule this appointment.  You may see Kathlyn Sacramento, MD or one of the following Advanced Practice Providers on your designated Care Team:   Murray Hodgkins, NP Christell Faith, PA-C . Marrianne Mood, PA-C  Any Other Special Instructions Will Be Listed Below (If Applicable).

## 2019-02-23 ENCOUNTER — Other Ambulatory Visit: Payer: Self-pay

## 2019-02-23 ENCOUNTER — Ambulatory Visit (HOSPITAL_COMMUNITY)
Admission: RE | Admit: 2019-02-23 | Discharge: 2019-02-23 | Disposition: A | Discharge status: Home / Self Care | Payer: PPO | Source: Ambulatory Visit | Attending: Physician Assistant | Admitting: Physician Assistant

## 2019-02-23 ENCOUNTER — Encounter (HOSPITAL_COMMUNITY): Payer: Self-pay | Admitting: Physician Assistant

## 2019-02-23 VITALS — BP 148/64 | HR 59 | Ht 70.0 in | Wt 203.8 lb

## 2019-02-23 DIAGNOSIS — E785 Hyperlipidemia, unspecified: Secondary | ICD-10-CM | POA: Diagnosis not present

## 2019-02-23 DIAGNOSIS — I480 Paroxysmal atrial fibrillation: Secondary | ICD-10-CM | POA: Diagnosis not present

## 2019-02-23 DIAGNOSIS — Z8249 Family history of ischemic heart disease and other diseases of the circulatory system: Secondary | ICD-10-CM | POA: Insufficient documentation

## 2019-02-23 DIAGNOSIS — I10 Essential (primary) hypertension: Secondary | ICD-10-CM | POA: Diagnosis not present

## 2019-02-23 DIAGNOSIS — Z79899 Other long term (current) drug therapy: Secondary | ICD-10-CM | POA: Diagnosis not present

## 2019-02-23 DIAGNOSIS — I2510 Atherosclerotic heart disease of native coronary artery without angina pectoris: Secondary | ICD-10-CM | POA: Insufficient documentation

## 2019-02-23 DIAGNOSIS — Z7901 Long term (current) use of anticoagulants: Secondary | ICD-10-CM | POA: Diagnosis not present

## 2019-02-23 NOTE — Progress Notes (Signed)
Primary Care Physician: Derinda Late, MD Primary Cardiologist: Dr Fletcher Anon Referring Physician: [facility]   Benjamin Brewer is a 85 y.o. male with a history of CAD s/p DES to LAD 2009, HTN, HLD, and paroxysmal afib. Patient reports he began having palpitations for several days after starting a steroid taper for his back issues. Patient presented to ER on 11/13/18 and was found to be in afib with RVR. He converted after starting IV diltiazem. He denies having any symptoms of SOB, CP, or dizziness during his afib episode. He denies snoring or significant alcohol use.   On follow up today, patient reports that he has done well since his last visit. He has not had any further heart racing or palpitations. His fatigue has improved on the lower dose of metoprolol.   Today, he denies symptoms of palpitations, chest pain, shortness of breath, orthopnea, PND, lower extremity edema, dizziness, presyncope, syncope, snoring, daytime somnolence, bleeding, or neurologic sequela. The patient is tolerating medications without difficulties and is otherwise without complaint today.    Atrial Fibrillation Risk Factors:  he does not have symptoms or diagnosis of sleep apnea. he does not have a history of rheumatic fever. he does not have a history of alcohol use. The patient does not have a history of early familial atrial fibrillation or other arrhythmias.  he has a BMI of Body mass index is 29.24 kg/m.Marland Kitchen Filed Weights   02/23/19 0932  Weight: 92.4 kg    Family History  Problem Relation Age of Onset  . Stroke Mother   . Heart attack Mother   . Hypertension Mother   . Stroke Other        family history     Atrial Fibrillation Management history:  Previous antiarrhythmic drugs: none Previous cardioversions: none Previous ablations: none CHADS2VASC score: 4 Anticoagulation history: Eliquis   Past Medical History:  Diagnosis Date  . A-fib ([place])   . Benign prostatic hypertrophy    Hx  of   . CAD (coronary artery disease)    native vessel. Status post LAD and RCA angioplasty and drug-eluting stent placement in 2009  . Dizziness   . Dyslipidemia   . History of tachycardia   . Hyperlipidemia   . Hypertension   . Intermediate coronary syndrome (HCC)    angina, unstable  . Numbness and tingling of both lower extremities   . Other dyspnea and respiratory abnormality    on exertion   Past Surgical History:  Procedure Laterality Date  . [place]; x2  . [place]  . ESOPHAGOGASTRODUODENOSCOPY (EGD) WITH PROPOFOL N/A 06/10/2015   Procedure: ESOPHAGOGASTRODUODENOSCOPY (EGD) WITH PROPOFOL;  Surgeon: Manya Silvas, MD;  Location: [facility] ENDOSCOPY;  Service: Endoscopy;  Laterality: N/A;  . HERNIA REPAIR    . SAVORY DILATION N/A 06/10/2015   Procedure: SAVORY DILATION;  Surgeon: Manya Silvas, MD;  Location: [facility] ENDOSCOPY;  Service: Endoscopy;  Laterality: N/A;  . TONSILLECTOMY      Current Outpatient Medications  Medication Sig Dispense Refill  . atorvastatin (LIPITOR) 40 MG tablet TAKE 1 TABLET BY MOUTH EVERY MORNING 90 tablet 2  . cyanocobalamin 500 MCG tablet Take 500 mcg by mouth daily.    Marland Kitchen ELIQUIS 5 MG TABS tablet TAKE 1 TABLET BY MOUTH TWICE DAILY 60 tablet 6  . ferrous sulfate (FERRO-BOB) 325 (65 FE) MG tablet Take 325  mg by mouth daily.      . finasteride (PROSCAR) 5 MG tablet Take 5 mg by mouth daily.    . furosemide (LASIX) 20 MG tablet Take 1 tablet (20 mg total) by mouth daily as needed for fluid or edema. 30 tablet 0  . losartan (COZAAR) 25 MG tablet Take 1 tablet (25 mg total) by mouth daily. 90 tablet 3  . metoprolol succinate (TOPROL-XL) 25 MG 24 hr tablet Take 0.5 tablets (12.5 mg total) by mouth daily. 45 tablet 3  . Multiple Vitamins-Minerals (EYE VITAMINS PO) Take 1 tablet by mouth 2 (two) times daily.     . nitroGLYCERIN (NITROSTAT) 0.4 MG SL  tablet place 1 tablet under the tongue if needed every 5 minutes fo 25 tablet 3  . pantoprazole (PROTONIX) 40 MG tablet TAKE 1 TABLET BY MOUTH DAILY 30 tablet 5  . diltiazem (CARDIZEM) 30 MG tablet TAKE ONE TABLET EVERY 4 HOURS AS NEEDED FOR AFIB HEART RATE >100 (Patient not taking: Reported on 02/23/2019) 45 tablet 1   No current facility-administered medications for this encounter.     Allergies  Allergen Reactions  . Prednisone     "they think threw me into A-fib"  . Terazosin Other (See Comments)    Other reaction(s): Muscle Pain    Social History   Socioeconomic History  . Marital status: Widowed    Spouse name: Not on file  . Number of children: 1  . Years of education: Not on file  . Highest education level: Bachelor's degree (e.g., BA, AB, BS)  Occupational History  . Not on file  Social Needs  . Financial resource strain: Not on file  . Food insecurity    Worry: Not on file    Inability: Not on file  . Transportation needs    Medical: Not on file    Non-medical: Not on file  Tobacco Use  . Smoking status: Never Smoker  . Smokeless tobacco: Never Used  Substance and Sexual Activity  . Alcohol use: Yes    Comment: occasional   . Drug use: No  . Sexual activity: Not on file  Lifestyle  . Physical activity    Days per week: Not on file    Minutes per session: Not on file  . Stress: Not on file  Relationships  . Social Herbalist on phone: Not on file    Gets together: Not on file    Attends religious service: Not on file    Active member of club or organization: Not on file    Attends meetings of clubs or organizations: Not on file    Relationship status: Not on file  . Intimate partner violence    Fear of current or ex partner: Not on file    Emotionally abused: Not on file    Physically abused: Not on file    Forced sexual activity: Not on file  Other Topics Concern  . Not on file  Social History Narrative   Widowed retired 12/95. Gets  regular exercise.       Lives at home alone   Right handed     ROS- All systems are reviewed and negative except as per the HPI above.  Physical Exam: Vitals:   02/23/19 0932  BP: (!) 148/64  Pulse: (!) 59  Weight: 92.4 kg  Height: 5\' 10"  (1.778 m)    GEN- The patient is well appearing elderly male, alert and oriented x 3 today.   Head-  normocephalic, atraumatic Eyes-  Sclera clear, conjunctiva pink Ears- hearing intact Oropharynx- clear Neck- supple  Lungs- Clear to ausculation bilaterally, normal work of breathing Heart- Regular rate and rhythm, no murmurs, rubs or gallops  GI- soft, NT, ND, + BS Extremities- no clubbing, cyanosis, or edema MS- no significant deformity or atrophy Skin- no rash or lesion Psych- euthymic mood, full affect Neuro- strength and sensation are intact  Wt Readings from Last 3 Encounters:  02/23/19 92.4 kg  01/24/19 92.4 kg  01/09/19 93 kg    EKG today demonstrates SR HR 59, PAC, PR 188, QRS 104, QTc 425  Echo 01/15/18 - Left ventricle: The cavity size was normal. There was mild concentric hypertrophy. Systolic function was normal. The estimated ejection fraction was in the range of 60% to 65%. Wall motion was normal; there were no regional wall motion abnormalities. Doppler parameters are consistent with abnormal left ventricular relaxation (grade 1 diastolic dysfunction). - Aortic valve: Trileaflet; moderately thickened, moderately calcified leaflets, predominantly non-coronary leaflet. Valve mobility was restricted. Sclerosis without stenosis. There was no regurgitation. - Aortic root: The aortic root was normal in size. - Mitral valve: There was trivial regurgitation. - Left atrium: The atrium was mildly dilated. - Right ventricle: The cavity size was normal. Wall thickness was normal. Systolic function was normal. - Right atrium: The atrium was normal in size. - Tricuspid valve: There was trivial regurgitation.  - Pulmonic valve: There was trivial regurgitation. - Pulmonary arteries: Systolic pressure was within the normal range. - Inferior vena cava: The vessel was normal in size. - Pericardium, extracardiac: There was no pericardial effusion.  Epic records are reviewed at length today  Assessment and Plan:  1. Paroxysmal atrial fibrillation Patient appears to be maintaining SR. Continue Toprol 12.5 mg daily Continue Eliquis 5 mg BID (last Cr 1.2) Continue diltiazem 30 mg PRN q 4 hrs for heart racing.  This patients CHA2DS2-VASc Score and unadjusted Ischemic Stroke Rate (% per year) is equal to 4.8 % stroke rate/year from a score of 4  Above score calculated as 1 point each if present [CHF, HTN, DM, Vascular=MI/PAD/Aortic Plaque, Age if 65-74, or Male] Above score calculated as 2 points each if present [Age > 75, or Stroke/TIA/TE]   2. CAD No anginal symptoms. Continue present therapy and risk factor modification.   3. HTN Stable, no changes today.   Follow up with Christell Faith per recall. AF clinic in 6 months.   [facility] [address] [place], [place] [address] [phone] 02/23/2019 9:53 AM

## 2019-03-23 DIAGNOSIS — Z03818 Encounter for observation for suspected exposure to other biological agents ruled out: Secondary | ICD-10-CM | POA: Diagnosis not present

## 2019-05-05 ENCOUNTER — Other Ambulatory Visit: Payer: Self-pay | Admitting: Cardiovascular Disease

## 2019-05-11 DIAGNOSIS — J3489 Other specified disorders of nose and nasal sinuses: Secondary | ICD-10-CM | POA: Diagnosis not present

## 2019-05-11 DIAGNOSIS — R0981 Nasal congestion: Secondary | ICD-10-CM | POA: Diagnosis not present

## 2019-05-11 DIAGNOSIS — Z20828 Contact with and (suspected) exposure to other viral communicable diseases: Secondary | ICD-10-CM | POA: Diagnosis not present

## 2019-05-15 DIAGNOSIS — Z79899 Other long term (current) drug therapy: Secondary | ICD-10-CM | POA: Diagnosis not present

## 2019-05-15 DIAGNOSIS — E7800 Pure hypercholesterolemia, unspecified: Secondary | ICD-10-CM | POA: Diagnosis not present

## 2019-05-18 DIAGNOSIS — H353133 Nonexudative age-related macular degeneration, bilateral, advanced atrophic without subfoveal involvement: Secondary | ICD-10-CM | POA: Diagnosis not present

## 2019-05-18 DIAGNOSIS — H2513 Age-related nuclear cataract, bilateral: Secondary | ICD-10-CM | POA: Diagnosis not present

## 2019-05-22 DIAGNOSIS — Z0000 Encounter for general adult medical examination without abnormal findings: Secondary | ICD-10-CM | POA: Diagnosis not present

## 2019-05-22 DIAGNOSIS — Z79899 Other long term (current) drug therapy: Secondary | ICD-10-CM | POA: Diagnosis not present

## 2019-05-22 DIAGNOSIS — N183 Chronic kidney disease, stage 3 unspecified: Secondary | ICD-10-CM | POA: Diagnosis not present

## 2019-05-22 DIAGNOSIS — I480 Paroxysmal atrial fibrillation: Secondary | ICD-10-CM | POA: Diagnosis not present

## 2019-05-22 DIAGNOSIS — E7800 Pure hypercholesterolemia, unspecified: Secondary | ICD-10-CM | POA: Diagnosis not present

## 2019-05-22 DIAGNOSIS — Z23 Encounter for immunization: Secondary | ICD-10-CM | POA: Diagnosis not present

## 2019-05-22 DIAGNOSIS — I10 Essential (primary) hypertension: Secondary | ICD-10-CM | POA: Diagnosis not present

## 2019-05-22 DIAGNOSIS — Z1331 Encounter for screening for depression: Secondary | ICD-10-CM | POA: Diagnosis not present

## 2019-06-03 ENCOUNTER — Other Ambulatory Visit: Payer: Self-pay | Admitting: Cardiovascular Disease

## 2019-06-03 DIAGNOSIS — Z20828 Contact with and (suspected) exposure to other viral communicable diseases: Secondary | ICD-10-CM | POA: Diagnosis not present

## 2019-06-16 DIAGNOSIS — I4891 Unspecified atrial fibrillation: Secondary | ICD-10-CM | POA: Diagnosis not present

## 2019-06-16 DIAGNOSIS — H2512 Age-related nuclear cataract, left eye: Secondary | ICD-10-CM | POA: Diagnosis not present

## 2019-06-27 ENCOUNTER — Other Ambulatory Visit: Payer: Self-pay

## 2019-06-27 ENCOUNTER — Encounter: Payer: Self-pay | Admitting: Ophthalmology

## 2019-06-29 ENCOUNTER — Other Ambulatory Visit: Payer: Self-pay

## 2019-06-29 ENCOUNTER — Other Ambulatory Visit
Admission: RE | Admit: 2019-06-29 | Discharge: 2019-06-29 | Disposition: A | Discharge status: Home / Self Care | Payer: PPO | Source: Ambulatory Visit | Attending: Ophthalmology | Admitting: Ophthalmology

## 2019-06-29 DIAGNOSIS — Z01812 Encounter for preprocedural laboratory examination: Secondary | ICD-10-CM | POA: Insufficient documentation

## 2019-06-29 DIAGNOSIS — Z20828 Contact with and (suspected) exposure to other viral communicable diseases: Secondary | ICD-10-CM | POA: Insufficient documentation

## 2019-06-29 LAB — SARS CORONAVIRUS 2 (TAT 6-24 HRS): SARS Coronavirus 2: NEGATIVE

## 2019-06-29 NOTE — Discharge Instructions (Signed)

## 2019-07-01 ENCOUNTER — Other Ambulatory Visit: Payer: Self-pay | Admitting: Cardiovascular Disease

## 2019-07-03 ENCOUNTER — Encounter
Admission: RE | Disposition: A | Discharge status: Home / Self Care | Payer: Self-pay | Source: Home / Self Care | Attending: Ophthalmology

## 2019-07-03 ENCOUNTER — Ambulatory Visit
Admission: RE | Admit: 2019-07-03 | Discharge: 2019-07-03 | Disposition: A | Discharge status: Home / Self Care | Payer: PPO | Attending: Ophthalmology | Admitting: Ophthalmology

## 2019-07-03 ENCOUNTER — Ambulatory Visit: Payer: PPO | Admitting: Anesthesiology

## 2019-07-03 ENCOUNTER — Other Ambulatory Visit: Payer: Self-pay

## 2019-07-03 DIAGNOSIS — G629 Polyneuropathy, unspecified: Secondary | ICD-10-CM | POA: Diagnosis not present

## 2019-07-03 DIAGNOSIS — Z7982 Long term (current) use of aspirin: Secondary | ICD-10-CM | POA: Diagnosis not present

## 2019-07-03 DIAGNOSIS — H25812 Combined forms of age-related cataract, left eye: Secondary | ICD-10-CM | POA: Diagnosis not present

## 2019-07-03 DIAGNOSIS — I4891 Unspecified atrial fibrillation: Secondary | ICD-10-CM | POA: Diagnosis not present

## 2019-07-03 DIAGNOSIS — H2512 Age-related nuclear cataract, left eye: Secondary | ICD-10-CM | POA: Diagnosis not present

## 2019-07-03 DIAGNOSIS — Z7901 Long term (current) use of anticoagulants: Secondary | ICD-10-CM | POA: Insufficient documentation

## 2019-07-03 DIAGNOSIS — M1990 Unspecified osteoarthritis, unspecified site: Secondary | ICD-10-CM | POA: Insufficient documentation

## 2019-07-03 DIAGNOSIS — Z79899 Other long term (current) drug therapy: Secondary | ICD-10-CM | POA: Diagnosis not present

## 2019-07-03 DIAGNOSIS — E7800 Pure hypercholesterolemia, unspecified: Secondary | ICD-10-CM | POA: Insufficient documentation

## 2019-07-03 DIAGNOSIS — I2510 Atherosclerotic heart disease of native coronary artery without angina pectoris: Secondary | ICD-10-CM | POA: Insufficient documentation

## 2019-07-03 DIAGNOSIS — K219 Gastro-esophageal reflux disease without esophagitis: Secondary | ICD-10-CM | POA: Insufficient documentation

## 2019-07-03 DIAGNOSIS — N400 Enlarged prostate without lower urinary tract symptoms: Secondary | ICD-10-CM | POA: Diagnosis not present

## 2019-07-03 DIAGNOSIS — Z955 Presence of coronary angioplasty implant and graft: Secondary | ICD-10-CM | POA: Insufficient documentation

## 2019-07-03 DIAGNOSIS — Z888 Allergy status to other drugs, medicaments and biological substances status: Secondary | ICD-10-CM | POA: Diagnosis not present

## 2019-07-03 DIAGNOSIS — I10 Essential (primary) hypertension: Secondary | ICD-10-CM | POA: Diagnosis not present

## 2019-07-03 HISTORY — DX: Presence of dental prosthetic device (complete) (partial): Z97.2

## 2019-07-03 HISTORY — DX: Dizziness and giddiness: R42

## 2019-07-03 HISTORY — DX: Mononeuropathy, unspecified: G58.9

## 2019-07-03 HISTORY — DX: Gastro-esophageal reflux disease without esophagitis: K21.9

## 2019-07-03 HISTORY — DX: Unspecified osteoarthritis, unspecified site: M19.90

## 2019-07-03 HISTORY — PX: CATARACT EXTRACTION W/PHACO: SHX586

## 2019-07-03 SURGERY — PHACOEMULSIFICATION, CATARACT, WITH IOL INSERTION
Anesthesia: Monitor Anesthesia Care | Site: Eye | Laterality: Left

## 2019-07-03 MED ORDER — SODIUM HYALURONATE 10 MG/ML IO SOLN
INTRAOCULAR | Status: DC | PRN
  Administered 2019-07-03: 0.55 mL via INTRAOCULAR

## 2019-07-03 MED ORDER — SODIUM HYALURONATE 23 MG/ML IO SOLN
INTRAOCULAR | Status: DC | PRN
  Administered 2019-07-03: 0.6 mL via INTRAOCULAR

## 2019-07-03 MED ORDER — EPINEPHRINE PF 1 MG/ML IJ SOLN
INTRAOCULAR | Status: DC | PRN
  Administered 2019-07-03: 110 mL via OPHTHALMIC

## 2019-07-03 MED ORDER — FENTANYL CITRATE (PF) 100 MCG/2ML IJ SOLN
INTRAMUSCULAR | Status: DC | PRN
  Administered 2019-07-03 (×2): 50 ug via INTRAVENOUS

## 2019-07-03 MED ORDER — MIDAZOLAM HCL 2 MG/2ML IJ SOLN
INTRAMUSCULAR | Status: DC | PRN
  Administered 2019-07-03 (×2): 1 mg via INTRAVENOUS

## 2019-07-03 MED ORDER — ARMC OPHTHALMIC DILATING DROPS
1.0000 "application " | OPHTHALMIC | Status: DC | PRN
  Administered 2019-07-03 (×3): 1 via OPHTHALMIC

## 2019-07-03 MED ORDER — TETRACAINE HCL 0.5 % OP SOLN
1.0000 [drp] | OPHTHALMIC | Status: DC | PRN
  Administered 2019-07-03 (×3): 1 [drp] via OPHTHALMIC

## 2019-07-03 MED ORDER — LIDOCAINE HCL (PF) 2 % IJ SOLN
INTRAOCULAR | Status: DC | PRN
  Administered 2019-07-03: 1 mL via INTRAOCULAR

## 2019-07-03 MED ORDER — MOXIFLOXACIN HCL 0.5 % OP SOLN
OPHTHALMIC | Status: DC | PRN
  Administered 2019-07-03: 0.2 mL via OPHTHALMIC

## 2019-07-03 SURGICAL SUPPLY — 19 items
CANNULA ANT/CHMB 27G (MISCELLANEOUS) ×2 IMPLANT
CANNULA ANT/CHMB 27GA (MISCELLANEOUS) ×4 IMPLANT
DISSECTOR HYDRO NUCLEUS 50X22 (MISCELLANEOUS) ×2 IMPLANT
GLOVE SURG LX 7.5 STRW (GLOVE) ×1
GLOVE SURG LX STRL 7.5 STRW (GLOVE) ×1 IMPLANT
GLOVE SURG SYN 8.5  E (GLOVE) ×1
GLOVE SURG SYN 8.5 E (GLOVE) ×1 IMPLANT
GLOVE SURG SYN 8.5 PF PI (GLOVE) ×1 IMPLANT
GOWN STRL REUS W/ TWL LRG LVL3 (GOWN DISPOSABLE) ×2 IMPLANT
GOWN STRL REUS W/TWL LRG LVL3 (GOWN DISPOSABLE) ×2
LENS IOL TECNIS ITEC 21.0 (Intraocular Lens) ×1 IMPLANT
MARKER SKIN DUAL TIP RULER LAB (MISCELLANEOUS) ×2 IMPLANT
PACK DR. KING ARMS (PACKS) ×2 IMPLANT
PACK EYE AFTER SURG (MISCELLANEOUS) ×2 IMPLANT
PACK OPTHALMIC (MISCELLANEOUS) ×2 IMPLANT
SYR 3ML LL SCALE MARK (SYRINGE) ×2 IMPLANT
SYR TB 1ML LUER SLIP (SYRINGE) ×2 IMPLANT
WATER STERILE IRR 250ML POUR (IV SOLUTION) ×2 IMPLANT
WIPE NON LINTING 3.25X3.25 (MISCELLANEOUS) ×2 IMPLANT

## 2019-07-03 NOTE — Anesthesia Preprocedure Evaluation (Signed)
Anesthesia Evaluation  Patient identified by MRN, date of birth, ID band Patient awake    Reviewed: Allergy & Precautions, H&P , NPO status , Patient's Chart, lab work & pertinent test results  Airway Mallampati: III  TM Distance: >3 FB Neck ROM: Full    Dental  (+) Poor Dentition, Chipped   Pulmonary neg pulmonary ROS,    Pulmonary exam normal        Cardiovascular Exercise Tolerance: Good hypertension, + CAD and + Cardiac Stents (LAD and RCA stents in 2009)  Normal cardiovascular exam     Neuro/Psych BPPVnegative neurological ROS     GI/Hepatic Neg liver ROS, GERD  Controlled,  Endo/Other  negative endocrine ROS  Renal/GU Renal disease  negative genitourinary   Musculoskeletal  (+) Arthritis , Osteoarthritis,    Abdominal Normal abdominal exam  (+)   Peds  Hematology negative hematology ROS (+)   Anesthesia Other Findings   Reproductive/Obstetrics negative OB ROS                             Anesthesia Physical  Anesthesia Plan  ASA: III  Anesthesia Plan: MAC   Post-op Pain Management:    Induction: Intravenous  PONV Risk Score and Plan: 1 and Ondansetron and Propofol infusion  Airway Management Planned: Nasal Cannula  Additional Equipment: None  Intra-op Plan:   Post-operative Plan:   Informed Consent: I have reviewed the patients History and Physical, chart, labs and discussed the procedure including the risks, benefits and alternatives for the proposed anesthesia with the patient or authorized representative who has indicated his/her understanding and acceptance.     Dental Advisory Given  Plan Discussed with: CRNA  Anesthesia Plan Comments:         Anesthesia Quick Evaluation

## 2019-07-03 NOTE — Anesthesia Procedure Notes (Signed)
Procedure Name: MAC Date/Time: 07/03/2019 10:33 AM Performed by: Georga Bora, CRNA Pre-anesthesia Checklist: Patient identified, Emergency Drugs available, Suction available, Patient being monitored and Timeout performed Patient Re-evaluated:Patient Re-evaluated prior to induction Oxygen Delivery Method: Nasal cannula

## 2019-07-03 NOTE — Op Note (Signed)
OPERATIVE NOTE  CLARION TRASK [id number] 07/03/2019   PREOPERATIVE DIAGNOSIS:  Nuclear sclerotic cataract left eye.  H25.12   POSTOPERATIVE DIAGNOSIS:    Nuclear sclerotic cataract left eye.     PROCEDURE:  Phacoemusification with posterior chamber intraocular lens placement of the left eye   LENS:   Implant Name Type Inv. Item Serial No. Manufacturer Lot No. LRB No. Used Action  LENS IOL DIOP 21.0 - [id number] Intraocular Lens LENS IOL DIOP 21.0 [id number]  Left 1 Implanted      Procedure(s): CATARACT EXTRACTION PHACO AND INTRAOCULAR LENS PLACEMENT (IOC) LEFT 10.19  01:06.9 (Left)  PCB00 +21.0   ULTRASOUND TIME: 1 minutes 06 seconds.  CDE 10.19   SURGEON:  Benay Pillow, MD, MPH   ANESTHESIA:  Topical with tetracaine drops augmented with 1% preservative-free intracameral lidocaine.  ESTIMATED BLOOD LOSS: <1 mL   COMPLICATIONS:  None.   DESCRIPTION OF PROCEDURE:  The patient was identified in the holding room and transported to the operating room and placed in the supine position under the operating microscope.  The left eye was identified as the operative eye and it was prepped and draped in the usual sterile ophthalmic fashion.   A 1.0 millimeter clear-corneal paracentesis was made at the 5:00 position. 0.5 ml of preservative-free 1% lidocaine with epinephrine was injected into the anterior chamber.  The anterior chamber was filled with Healon 5 viscoelastic.  A 2.4 millimeter keratome was used to make a near-clear corneal incision at the 2:00 position.  A curvilinear capsulorrhexis was made with a cystotome and capsulorrhexis forceps.  Balanced salt solution was used to hydrodissect and hydrodelineate the nucleus.   Phacoemulsification was then used in stop and chop fashion to remove the lens nucleus and epinucleus.  The remaining cortex was then removed using the irrigation and aspiration handpiece. Healon was then placed into the capsular bag to distend it for lens  placement.  A lens was then injected into the capsular bag.  The remaining viscoelastic was aspirated.   Wounds were hydrated with balanced salt solution.  The anterior chamber was inflated to a physiologic pressure with balanced salt solution.  Intracameral vigamox 0.1 mL undiltued was injected into the eye and a drop placed onto the ocular surface.  No wound leaks were noted.  The patient was taken to the recovery room in stable condition without complications of anesthesia or surgery  Benay Pillow 07/03/2019, 11:01 AM

## 2019-07-03 NOTE — H&P (Signed)

## 2019-07-03 NOTE — Transfer of Care (Signed)
Immediate Anesthesia Transfer of Care Note  Patient: Benjamin Brewer  Procedure(s) Performed: CATARACT EXTRACTION PHACO AND INTRAOCULAR LENS PLACEMENT (IOC) LEFT 10.19  01:06.9 (Left Eye)  Patient Location: PACU  Anesthesia Type: MAC  Level of Consciousness: awake, alert  and patient cooperative  Airway and Oxygen Therapy: Patient Spontanous Breathing and Patient connected to supplemental oxygen  Post-op Assessment: Post-op Vital signs reviewed, Patient's Cardiovascular Status Stable, Respiratory Function Stable, Patent Airway and No signs of Nausea or vomiting  Post-op Vital Signs: Reviewed and stable  Complications: No apparent anesthesia complications

## 2019-07-03 NOTE — Anesthesia Postprocedure Evaluation (Signed)
Anesthesia Post Note  Patient: Benjamin Brewer  Procedure(s) Performed: CATARACT EXTRACTION PHACO AND INTRAOCULAR LENS PLACEMENT (IOC) LEFT 10.19  01:06.9 (Left Eye)     Anesthesia Post Evaluation  Ardeth Sportsman

## 2019-07-04 ENCOUNTER — Encounter: Payer: Self-pay | Admitting: *Deleted

## 2019-07-11 IMAGING — MR MRI CERVICAL SPINE WITHOUT CONTRAST
5 series · 37 of 48 positions shown · non-contrast
Comparison: None.

CLINICAL DATA: Neck pain, chronic, abnormal neuro exam. Seen at
[HOSPITAL] yesterday for dizziness.

EXAM:
MRI CERVICAL SPINE WITHOUT CONTRAST
TECHNIQUE: Multiplanar, multisequence MR imaging of the cervical spine was
performed. No intravenous contrast was administered.

[Series 5: T2 · sagittal · 3.0mm · 0.66mm/px · 6 of 15 slices shown (1 of 2)]
[im 1/15]
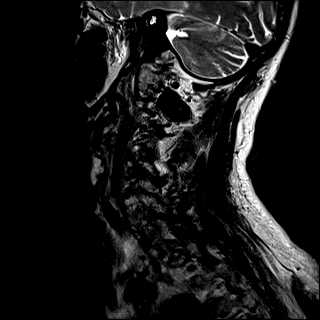
[im 3/15]
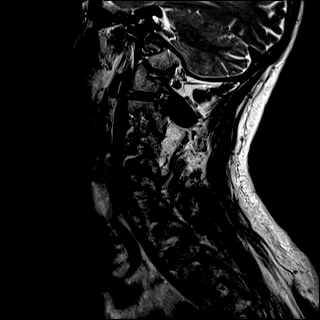
[im 6/15]
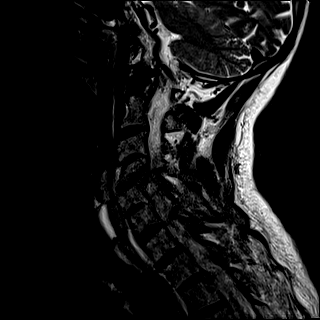
[im 9/15]
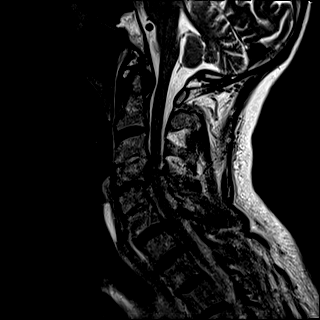
[im 12/15]
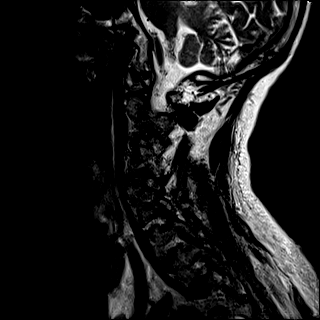
[im 15/15]
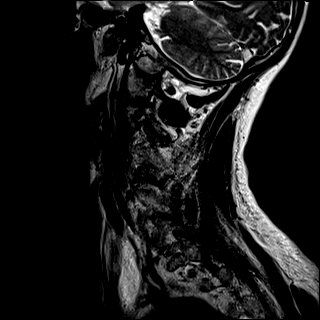

[Series 6: FLAIR · sagittal · 3.0mm · 0.82mm/px · 7 of 15 slices shown]
[im 1/15]
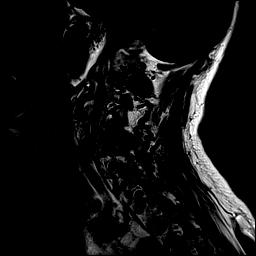
[im 3/15]
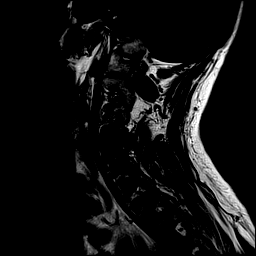
[im 5/15]
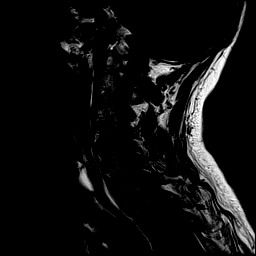
[im 8/15]
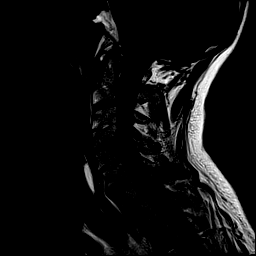
[im 10/15]
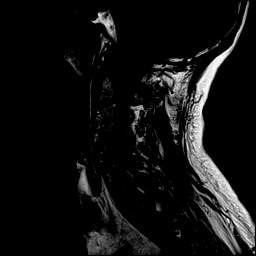
[im 12/15]
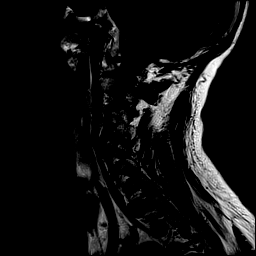
[im 15/15]
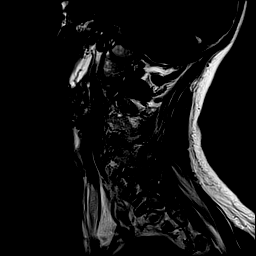

[Series 7: STIR · sagittal · 3.0mm · 0.66mm/px · 7 of 15 slices shown]
[im 1/15]
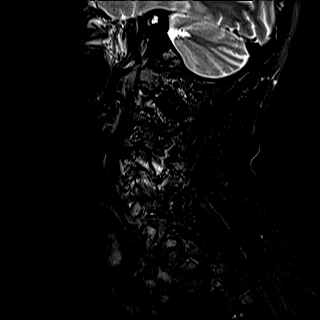
[im 3/15]
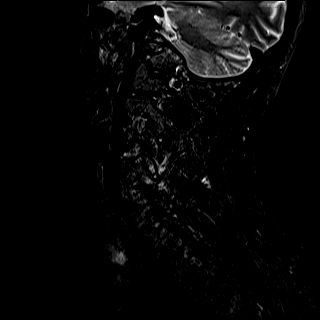
[im 5/15]
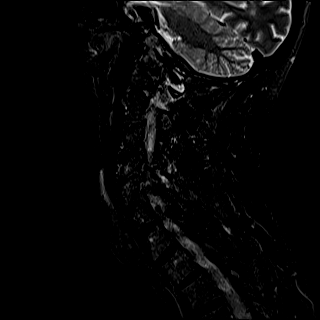
[im 8/15]
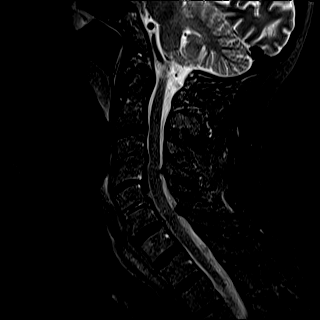
[im 10/15]
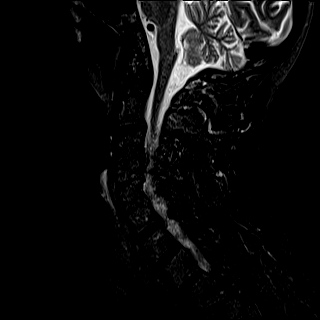
[im 12/15]
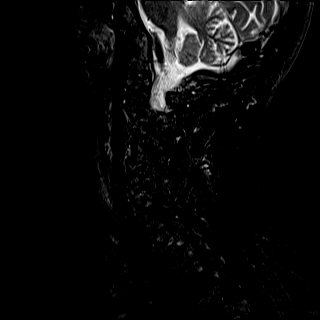
[im 15/15]
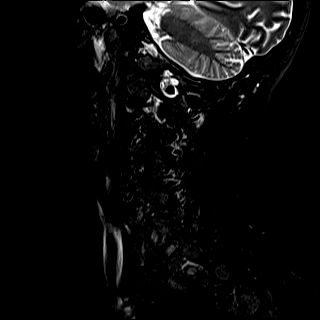

[Series 8: T2 · axial · 3.0mm · 0.70mm/px · z∈[-186,-85]mm · 9 of 30 slices shown (2 of 2)]
[im 1/30]
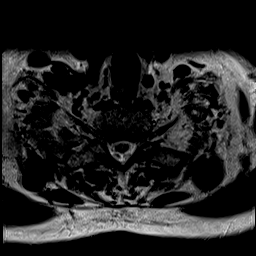
[im 3/30]
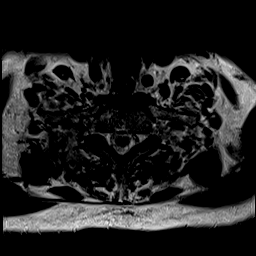
[im 5/30]
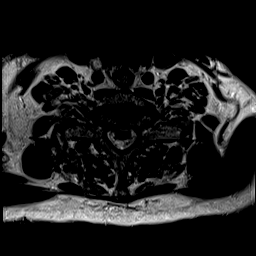
[im 9/30]
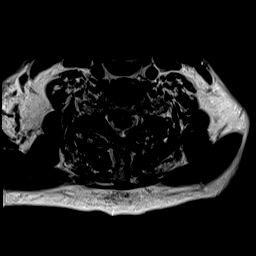
[im 14/30]
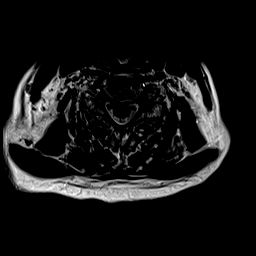
[im 16/30]
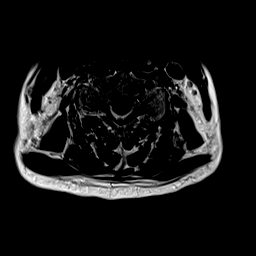
[im 21/30]
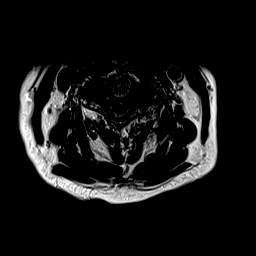
[im 25/30]
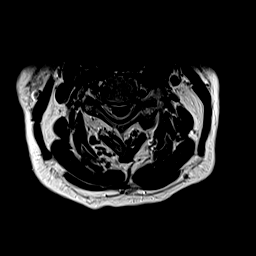
[im 30/30]
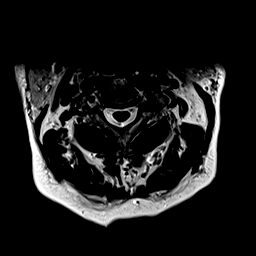

[Series 9: ax mpgr · axial · 3.0mm · 0.35mm/px · z∈[-186,-85]mm · 8 of 30 slices shown]
[im 1/30]
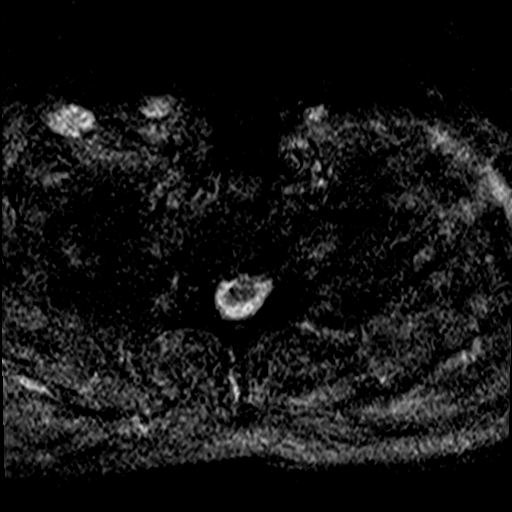
[im 5/30]
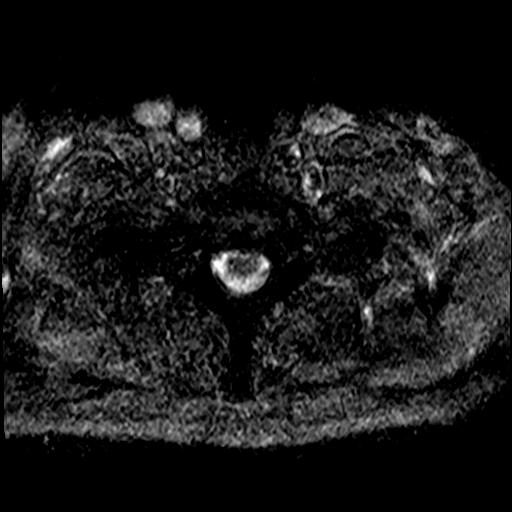
[im 9/30]
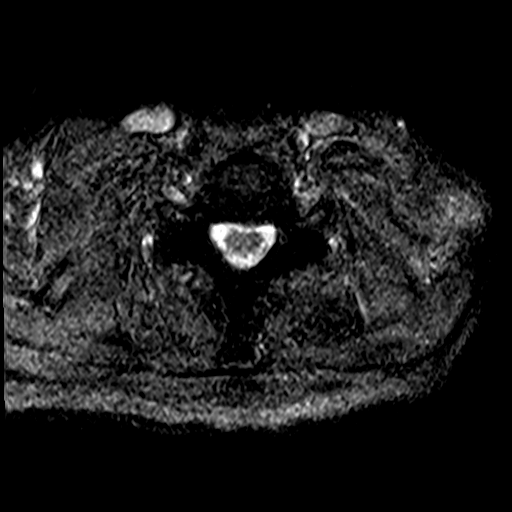
[im 14/30]
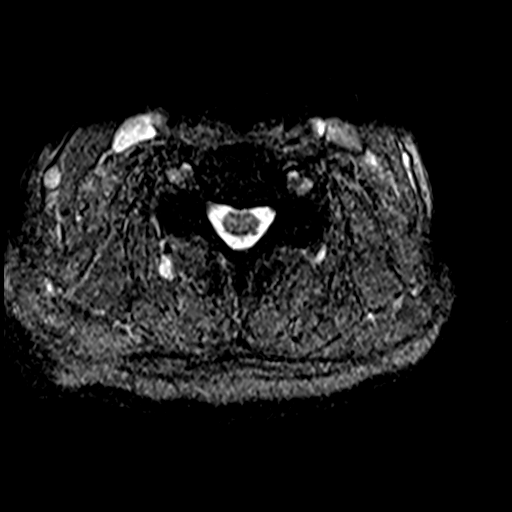
[im 16/30]
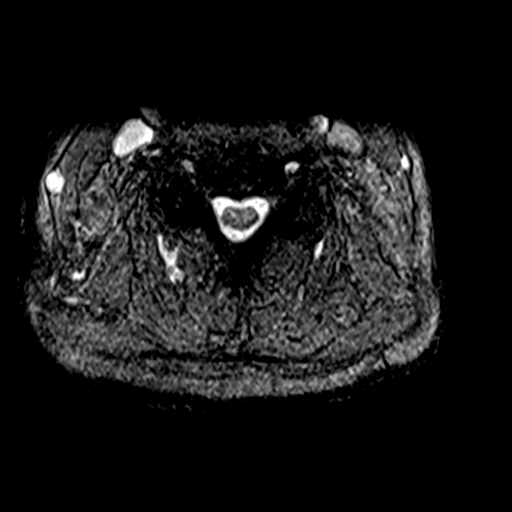
[im 21/30]
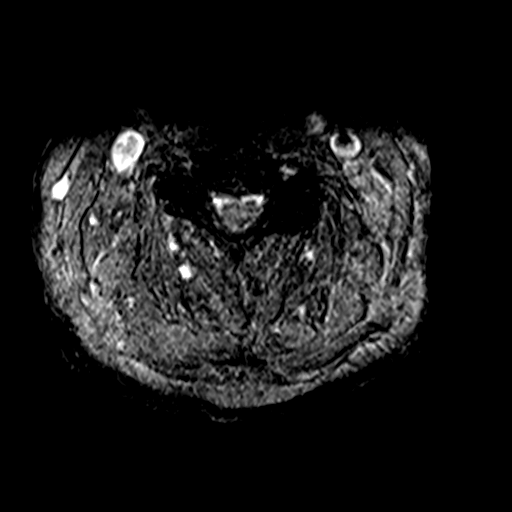
[im 25/30]
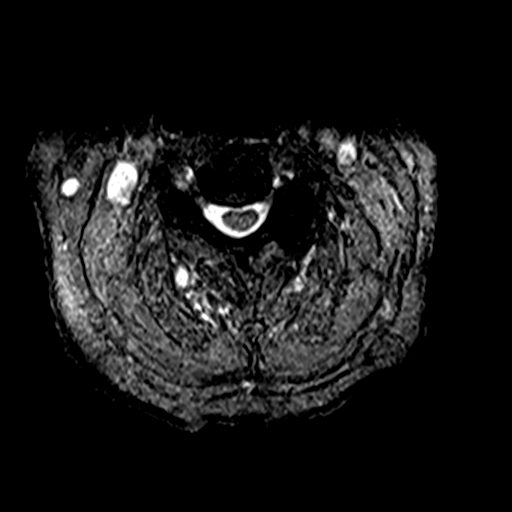
[im 30/30]
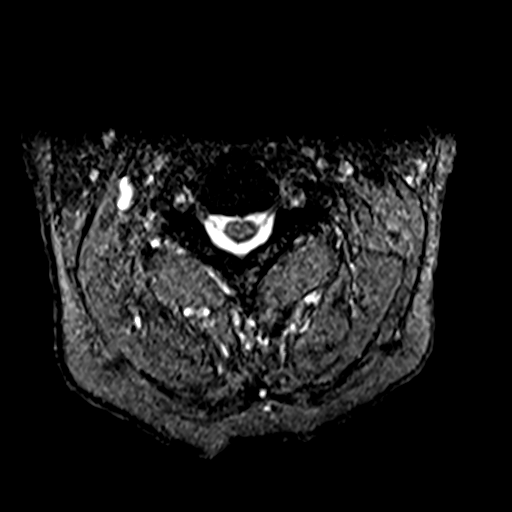

[37 of 48 positions shown; findings below may reference images not displayed]

FINDINGS: Alignment: Slight retrolisthesis is present at C4-5. Slight
anterolisthesis is present at C5-6. AP alignment is otherwise
anatomic.

Vertebrae: Chronic endplate changes are most noted at C1-2, C3-4,
C4-5, and C5-6. Marrow signal and vertebral body heights are
otherwise normal.

Cord: Normal signal is present in the cervical and upper thoracic
spinal cord to the lowest imaged level, T2-3.

Posterior Fossa, vertebral arteries, paraspinal tissues: The
craniocervical junction is normal. Visualized intracranial contents
are normal. Flow is present in the vertebral arteries bilaterally.

Disc levels:

C1-2: Negative

C2-3: Asymmetric left-sided uncovertebral and facet disease leads to
moderate left and mild right foraminal narrowing.

C3-4: Left posterior elements are fused. There is probable fusion
across the disc space is well. A prominent disc osteophyte complex
is present. There is partial effacement of the ventral CSF. Moderate
foraminal narrowing is present bilaterally, left greater than right.

C4-5: A broad-based disc osteophyte complex is present.
Uncovertebral spurring contributes to severe foraminal narrowing
bilaterally. Facet hypertrophy is worse on the right.

C5-6: A broad-based disc osteophyte complex is present. Moderate
foraminal narrowing is present bilaterally due to uncovertebral and
facet disease.

C6-7: A left paramedian disc protrusion effaces the ventral CSF and
likely contacts the cord. Moderate foraminal narrowing is worse on
the right.

C7-T1: Negative.
IMPRESSION: 1. Multilevel spondylosis of the cervical spine as described.
2. No acute or focal abnormality to explain dizziness.
3. Acquired fusion of posterior elements on the left at C3-4 and
likely across the disc space.
4. Mild central and moderate bilateral foraminal narrowing at C3-4
is worse on the left.
5. Severe foraminal narrowing bilaterally at C4-5 is worse on the
left.
6. Moderate foraminal narrowing at C5-6 and C6-7.
7. Left paramedian soft disc protrusion at C6-7 with moderate left
central canal stenosis.

## 2019-07-11 IMAGING — CT CT HEAD WITHOUT CONTRAST
3 series · 16 of 47 positions shown, 19 images · non-contrast
Comparison: MRI brain 01/12/2018

CLINICAL DATA: Ataxia.  Stroke suspected.  Vertigo.  Episodic.

EXAM:
CT HEAD WITHOUT CONTRAST
TECHNIQUE: Contiguous axial images were obtained from the base of the skull
through the vertex without intravenous contrast.

[Series 3: head wo · axial · 0.44mm/px · z∈[-72,+53]mm · 10 of 30 slices shown, 13 images]
[im 3/30  brain]
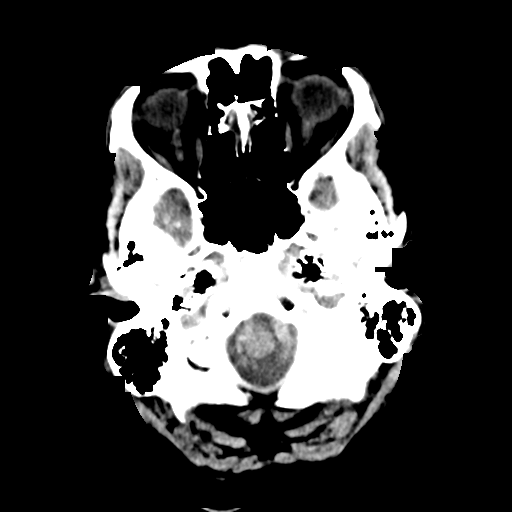
[im 3/30  bone]
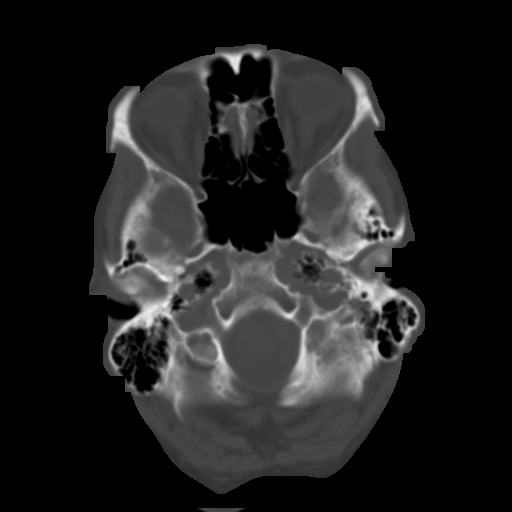
[im 6/30  brain]
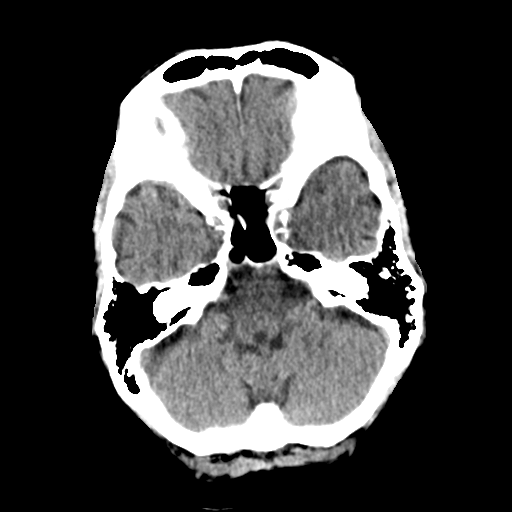
[im 9/30  brain]
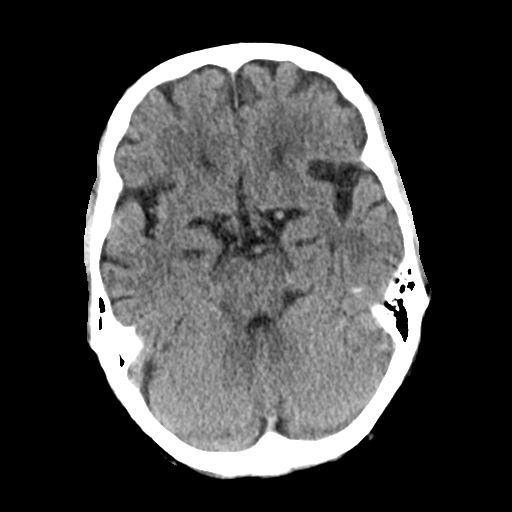
[im 11/30  brain]
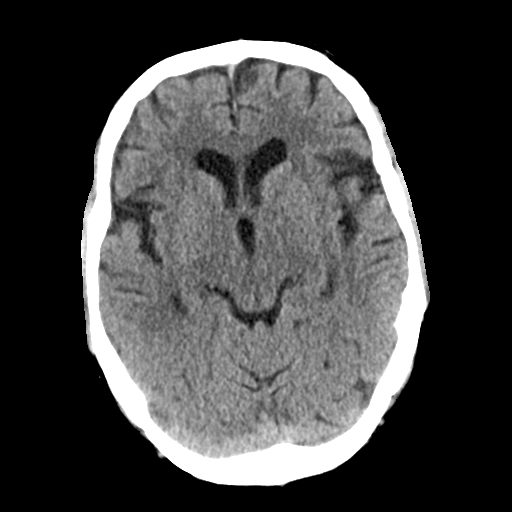
[im 14/30  brain]
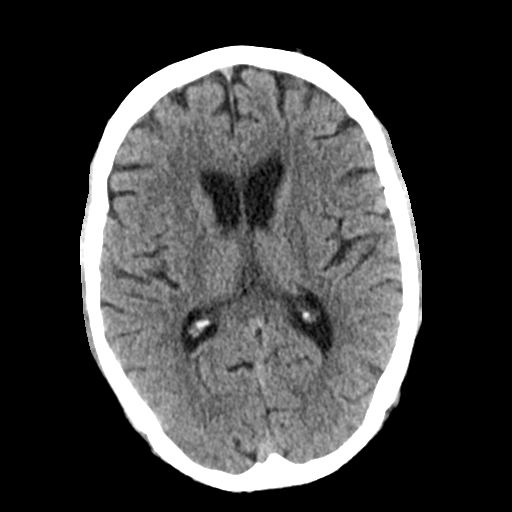
[im 14/30  bone]
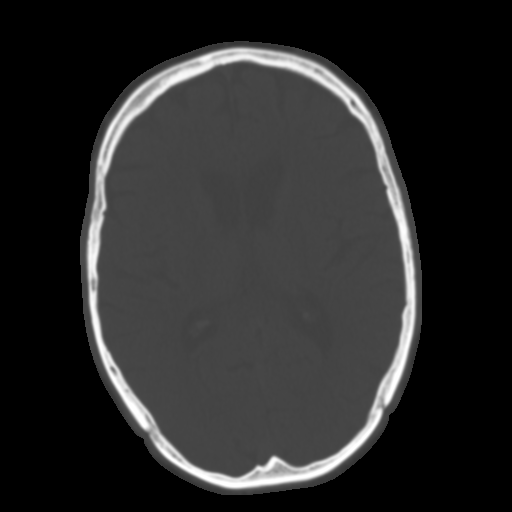
[im 17/30  brain]
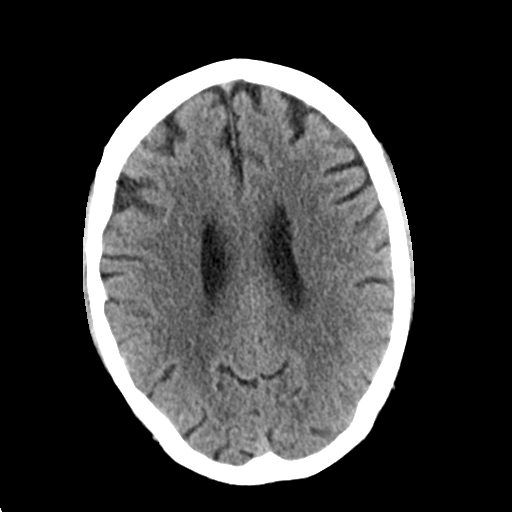
[im 20/30  brain]
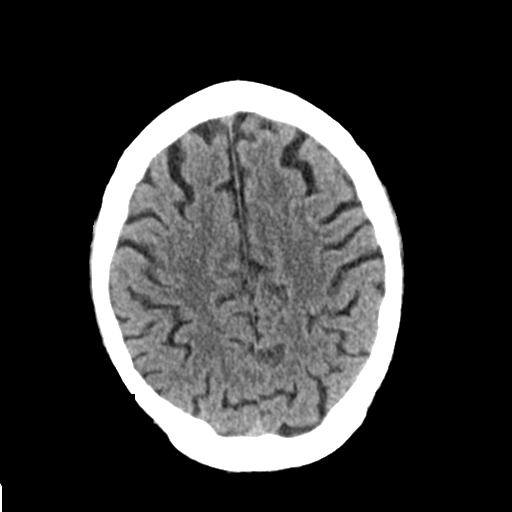
[im 23/30  brain]
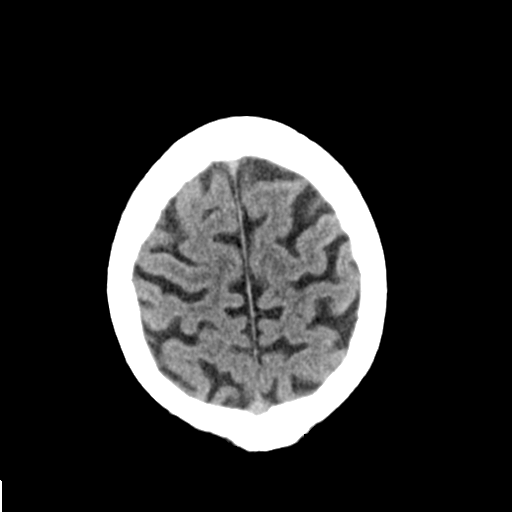
[im 25/30  brain]
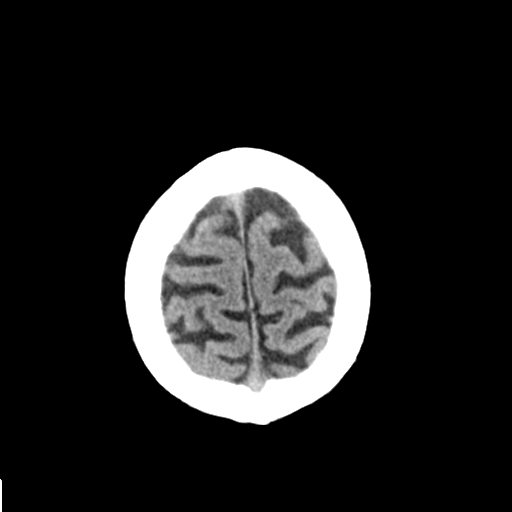
[im 25/30  bone]
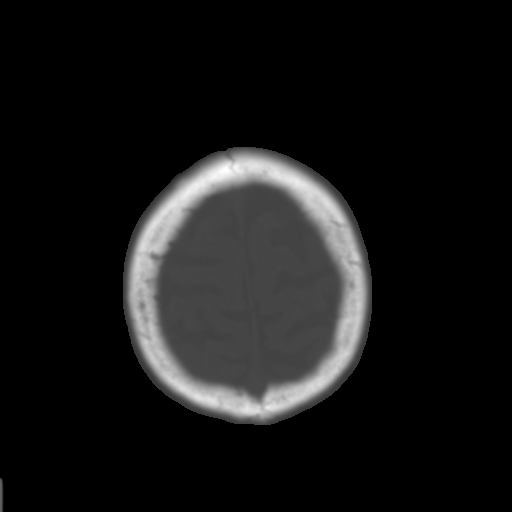
[im 28/30  brain]
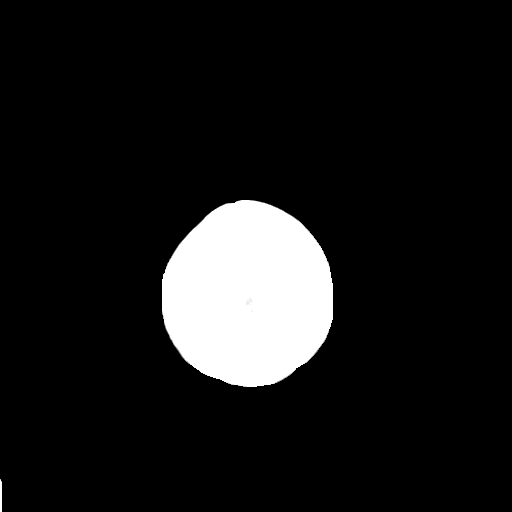

[Series 4: coronal soft tissue · coronal · 0.32mm/px · 3 of 66 slices shown]
[im 22/66  brain]
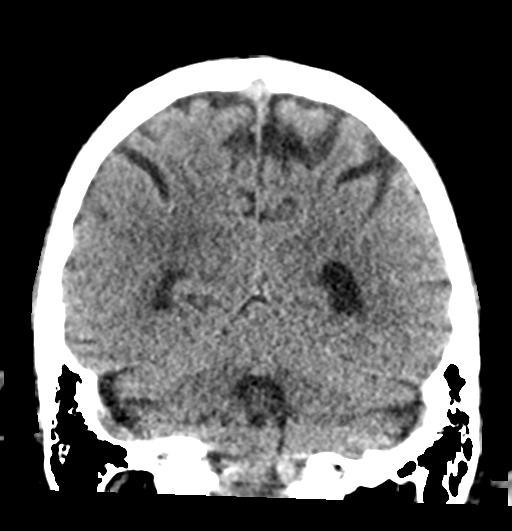
[im 29/66  brain]
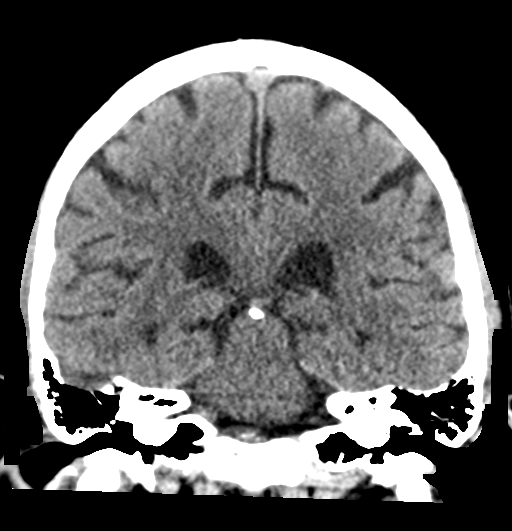
[im 37/66  brain]
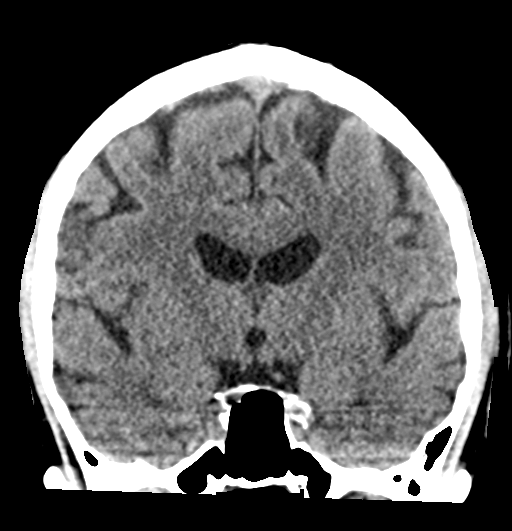

[Series 5: sagittal soft tissue · sagittal · 0.32mm/px · 3 of 53 slices shown]
[im 18/53  brain]
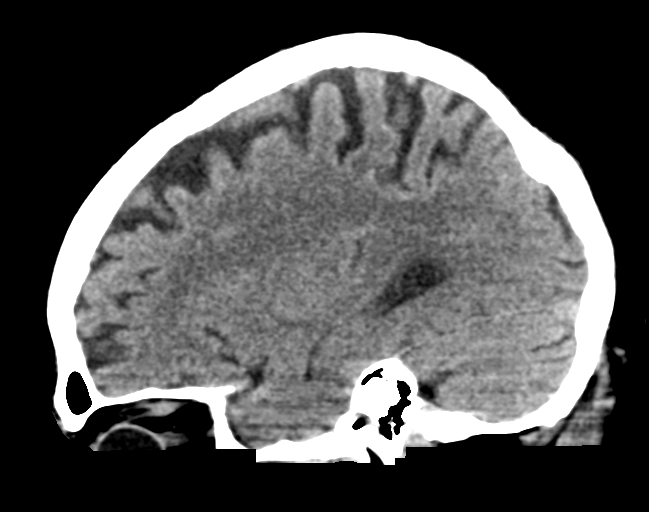
[im 27/53  brain]
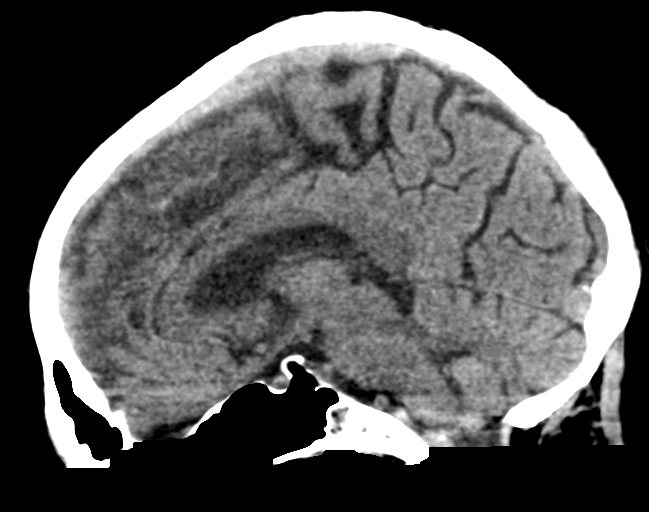
[im 35/53  brain]
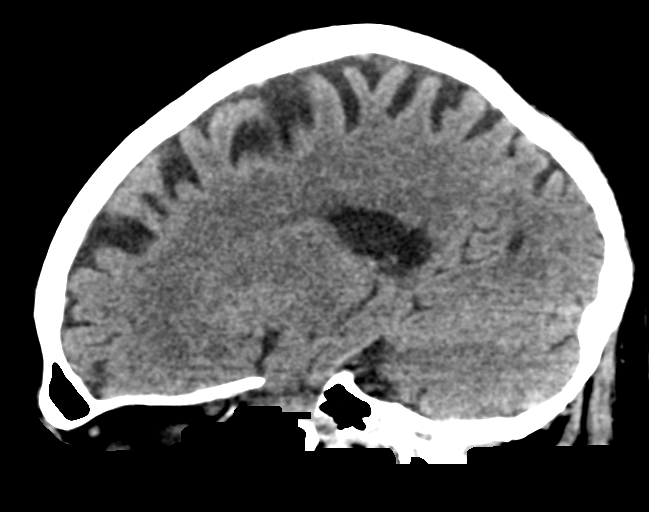

[16 of 47 positions shown; findings below may reference images not displayed]

FINDINGS: Brain: Mild atrophy and white matter changes are stable. No acute
infarct, hemorrhage, or mass lesion is present. The ventricles are
of proportionate to the degree of atrophy. No significant extraaxial
fluid collection is present. The brainstem and cerebellum are within
normal limits.

Vascular: Atherosclerotic changes are present within the cavernous
internal carotid arteries bilaterally. There is no hyperdense
vessel.

Skull: Calvarium is intact. No focal lytic or blastic lesions are
present.

Sinuses/Orbits: The paranasal sinuses and mastoid air cells are
clear. The globes and orbits are within normal limits.
IMPRESSION: 1. No acute intracranial abnormality to explain the patient's new
onset dizziness.
2. Stable atrophy and white matter disease.
3. Atherosclerosis

## 2019-07-17 ENCOUNTER — Other Ambulatory Visit: Payer: Self-pay | Admitting: Physician Assistant

## 2019-07-20 ENCOUNTER — Other Ambulatory Visit: Payer: Self-pay | Admitting: Cardiovascular Disease

## 2019-07-26 NOTE — Progress Notes (Signed)
Cardiology Office Note    Date:  07/31/2019   ID:  Benjamin Brewer, DOB [birth year], MRN [id number]  PCP:  Derinda Late, MD  Cardiologist:  Kathlyn Sacramento, MD  Electrophysiologist:  None   Chief Complaint: Follow-up  History of Present Illness:   Benjamin Brewer is a 85 y.o. male with history of CADas outlined below, PAF on Eliquis, HTN, HLD, and anxietywho presents for follow upof CAD and A. fib.  Patient underwent PCI/DES to the LAD and RCA in 2007/10/31. Follow up nuclear stress testing in 2011/10/31 showed no evidence of ischemia with normal EF. In October 30, 2013, he had an episode of Afib with RVR and has been on Eliquis and beta blocker since. Echo in October 31, 2014 showed an EF of 55-60%, mild MR, and a mildly dilated right atrium. Patient's wife passed away in 2017/10/30 and the patient has dealt with a significant amount of grief, anxiety, and depression since. He was seen in our office in 01/2018 with increased ankle edema as well as palpitations and left arm paresthesias. Symptoms were not similar to his prior Afib. There was no chest pain. Weight was noted to be down 2 pounds at 210 from his office visit in 08/2017. He underwent 48-hour Holter monitorthat showedNSR with an average heart rate of 65 bpm and range from 43-164 bpm.The longest pause was 1.64 seconds.There were 55 PVCs with an isolated triplet. There were 783 PACs with a rare couplet and 1 run of atrial tach lasting 70 beats.Some of the patient's symptoms corresponded to PACs/PVCs. Following this, he was seen in the ED in 01/2018 with left arm and leg paresthesias with imaging including CT head and MRI of the brain being nonacute.  Echo in 01/2018 showed an EF of 60 to 65%, no regional wall motion abnormalities, grade 1 diastolic dysfunction, aortic sclerosis without stenosis, trivial mitral regurgitation, mildly dilated left atrium, RV systolic function normal, trivial TR/PR, PASP normal.  He was seen in the ED twice in 10/2018 with dizziness and  paresthesias of the left upper extremity without evidence of stroke.  MRI of the C-spine showed foraminal narrowing bilaterally at C4-5, worse on the left side.  He was treated with prednisone which increased his palpitations and A. fib.  He was seen in the ED in 11/2018 for palpitations and was noted to be in A. fib with RVR.  He converted to sinus rhythm with IV diltiazem.  He followed up at the A. fib clinic and was prescribed as needed oral diltiazem.  He was seen in telemedicine follow-up on 11/15/2018 by his primary cardiologist and denied any recurrent A. fib.  He subsequently underwent repeat MRI brain and MRV head without acute findings.  He has been followed by neurology for his dizziness and paresthesias which have been felt to be secondary to C-spine narrowing, vertigo, and carpal tunnel disease.  I last saw him in 01/2019 for routine follow-up and he was doing well at that time and maintaining sinus rhythm.  In the setting of bradycardic heart rates his Toprol-XL was decreased to 12.5 mg daily.  He followed up with the A. fib clinic in 02/2019 and was doing well with no changes being made at that time.   He comes in doing well from a cardiac perspective.  He denies any chest pain.  He does continue to note intermittent short-lived palpitations with heart rates generally ranging from the 60s to 80s bpm.  He has not needed any as needed diltiazem.  Weight remains stable.  He has not needed any as needed Lasix.  He does note some shortness of breath first thing in the morning which is associated with significant postnasal drip and nasal congestion.  Symptoms improve as he clears this.  He is tolerating Eliquis without any issues.  He denies any BRBPR or melena.  No falls since he was last seen.  He continues to deal with intermittent numbness along the left upper and lower extremity, particularly along the lateral aspect of the left lower extremity.  Intermittently, there is some numbness along the right  foot.  He is followed by neurology for this.  He has received his first COVID-19 vaccine without issue.   Labs independently reviewed: 05/2019 - Hgb 12.7, PLT 246, potassium 4.1, BUN 21, serum creatinine 1.3, AST/ALT normal, albumin 4.3, TC 121, TG 71, HDL 38, LDL 68 12/2018 - TSH normal  Past Medical History:  Diagnosis Date  . A-fib ([place])   . Arthritis    hands  . Benign prostatic hypertrophy    Hx of   . CAD (coronary artery disease)    native vessel. Status post LAD and RCA angioplasty and drug-eluting stent placement in 2009  . Dental bridge present    implants, no bridge  . Dizziness   . Dyslipidemia   . GERD (gastroesophageal reflux disease)   . History of tachycardia   . Hyperlipidemia   . Hypertension   . Intermediate coronary syndrome (HCC)    angina, unstable  . Numbness and tingling of both lower extremities   . Other dyspnea and respiratory abnormality    on exertion  . Pinched nerve in neck    some discomfort with side to side movement  . Vertigo    approx 1x/month    Past Surgical History:  Procedure Laterality Date  . [place]; x2 (last stent 2009)  . [place]  . CATARACT EXTRACTION W/PHACO Left 07/03/2019   Procedure: CATARACT EXTRACTION PHACO AND INTRAOCULAR LENS PLACEMENT (IOC) LEFT 10.19  01:06.9;  Surgeon: Eulogio Bear, MD;  Location: [facility];  Service: Ophthalmology;  Laterality: Left;  . ESOPHAGOGASTRODUODENOSCOPY (EGD) WITH PROPOFOL N/A 06/10/2015   Procedure: ESOPHAGOGASTRODUODENOSCOPY (EGD) WITH PROPOFOL;  Surgeon: Manya Silvas, MD;  Location[facility] ENDOSCOPY;  Service: Endoscopy;  Laterality: N/A;  . HERNIA REPAIR    . SAVORY DILATION N/A 06/10/2015   Procedure: SAVORY DILATION;  Surgeon: Manya Silvas, MD;  Location: [facility] ENDOSCOPY;  Service: Endoscopy;  Laterality: N/A;  . TONSILLECTOMY      Current  Medications: Current Meds  Medication Sig  . ASPIRIN 81 PO Take by mouth daily.  Marland Kitchen atorvastatin (LIPITOR) 40 MG tablet TAKE 1 TABLET BY MOUTH EVERY MORNING  . cyanocobalamin 500 MCG tablet Take 2,500 mcg by mouth daily.   Marland Kitchen diltiazem (CARDIZEM) 30 MG tablet TAKE ONE TABLET EVERY 4 HOURS AS NEEDED FOR AFIB HEART RATE >100  . ELIQUIS 5 MG TABS tablet TAKE 1 TABLET BY MOUTH TWICE DAILY  . ferrous sulfate (FERRO-BOB) 325 (65 FE) MG tablet Take 325 mg by mouth daily.    . finasteride (PROSCAR) 5 MG tablet Take 5 mg by mouth daily.  . furosemide (LASIX) 20 MG tablet Take 1 tablet (20 mg total) by mouth daily as needed for fluid or edema.  Marland Kitchen losartan (COZAAR) 25 MG tablet Take 1 tablet (25 mg total) by mouth daily.  NEEDS OFFICE VISIT FOR FURTHER REFILLS.  Marland Kitchen metoprolol succinate (TOPROL-XL) 25 MG 24 hr tablet Take 0.5 tablets (12.5 mg total) by mouth daily.  . Multiple Vitamin (MULTIVITAMIN) tablet Take 1 tablet by mouth daily.  . Multiple Vitamins-Minerals (EYE VITAMINS PO) Take 1 tablet by mouth 2 (two) times daily.   . nitroGLYCERIN (NITROSTAT) 0.4 MG SL tablet place 1 tablet under the tongue if needed every 5 minutes fo  . pantoprazole (PROTONIX) 40 MG tablet TAKE ONE TABLET EVERY DAY. NEED FOR OFFICE USE VISIT FOR FURTHER REFILLS    Allergies:   Prednisone and Terazosin   Social History   Socioeconomic History  . Marital status: Widowed    Spouse name: Not on file  . Number of children: 1  . Years of education: Not on file  . Highest education level: Bachelor's degree (e.g., BA, AB, BS)  Occupational History  . Not on file  Tobacco Use  . Smoking status: Never Smoker  . Smokeless tobacco: Never Used  Substance and Sexual Activity  . Alcohol use: Yes    Alcohol/week: 1.0 standard drinks    Types: 1 Glasses of wine per week    Comment: occasional   . Drug use: No  . Sexual activity: Not on file  Other Topics Concern  . Not on file  Social History Narrative   Widowed retired  12/95. Gets regular exercise.       Lives at home alone   Right handed   Social Determinants of Health   Financial Resource Strain:   . Difficulty of Paying Living Expenses: Not on file  Food Insecurity:   . Worried About Charity fundraiser in the Last Year: Not on file  . Ran Out of Food in the Last Year: Not on file  Transportation Needs:   . Lack of Transportation (Medical): Not on file  . Lack of Transportation (Non-Medical): Not on file  Physical Activity:   . Days of Exercise per Week: Not on file  . Minutes of Exercise per Session: Not on file  Stress:   . Feeling of Stress : Not on file  Social Connections:   . Frequency of Communication with Friends and Family: Not on file  . Frequency of Social Gatherings with Friends and Family: Not on file  . Attends Religious Services: Not on file  . Active Member of Clubs or Organizations: Not on file  . Attends Archivist Meetings: Not on file  . Marital Status: Not on file     Family History:  The patient's family history includes Heart attack in his mother; Hypertension in his mother; Stroke in his mother and another family member.  ROS:   Review of Systems  Constitutional: Positive for malaise/fatigue. Negative for chills, diaphoresis, fever and weight loss.  HENT: Positive for congestion.   Eyes: Negative for discharge and redness.  Respiratory: Positive for shortness of breath. Negative for cough, hemoptysis, sputum production and wheezing.   Cardiovascular: Positive for palpitations. Negative for chest pain, orthopnea, claudication, leg swelling and PND.  Gastrointestinal: Negative for abdominal pain, blood in stool, heartburn, melena, nausea and vomiting.  Genitourinary: Negative for hematuria.  Musculoskeletal: Negative for falls and myalgias.  Skin: Negative for rash.  Neurological: Positive for tingling and weakness. Negative for dizziness, tremors, sensory change, speech change, focal weakness and loss  of consciousness.  Endo/Heme/Allergies: Does not bruise/bleed easily.  Psychiatric/Behavioral: Negative for substance abuse. The patient is not nervous/anxious.   All other systems reviewed and  are negative.    EKGs/Labs/Other Studies Reviewed:    Studies reviewed were summarized above. The additional studies were reviewed today:  2D echo 01/2018: - Left ventricle: The cavity size was normal. There was mild   concentric hypertrophy. Systolic function was normal. The   estimated ejection fraction was in the range of 60% to 65%. Wall   motion was normal; there were no regional wall motion   abnormalities. Doppler parameters are consistent with abnormal   left ventricular relaxation (grade 1 diastolic dysfunction). - Aortic valve: Trileaflet; moderately thickened, moderately   calcified leaflets, predominantly non-coronary leaflet. Valve   mobility was restricted. Sclerosis without stenosis. There was no   regurgitation. - Aortic root: The aortic root was normal in size. - Mitral valve: There was trivial regurgitation. - Left atrium: The atrium was mildly dilated. - Right ventricle: The cavity size was normal. Wall thickness was   normal. Systolic function was normal. - Right atrium: The atrium was normal in size. - Tricuspid valve: There was trivial regurgitation. - Pulmonic valve: There was trivial regurgitation. - Pulmonary arteries: Systolic pressure was within the normal   range. - Inferior vena cava: The vessel was normal in size. - Pericardium, extracardiac: There was no pericardial effusion. __________  48-hour Holter 01/2018: Normal sinus rhythm with average heart rate of 65 bpm.  Minimal heart rate of 43 bpm with the longest pause of 1.64 seconds Rare PACs and PVCs. Short runs of SVT with the longest run of 14 beats with a heart rate of 142 bpm Some of the patient's reported events, correlated with PACs and PVCs.   EKG:  EKG is ordered today.  The EKG ordered today  demonstrates NSR, 64 bpm, low voltage QRS, possible prior anteroseptal and inferior infarct, no acute ST-T changes, largely unchanged from prior  Recent Labs: 10/28/2018: Magnesium 2.3 01/09/2019: ALT 15; BUN 24; Creatinine, Ser 1.20; Hemoglobin 12.0; Platelets 237; Potassium 4.5; Sodium 142; TSH 2.990  Recent Lipid Panel    Component Value Date/Time   CHOL 115 10/08/2012 0554   TRIG 75 10/08/2012 0554   HDL 43 10/08/2012 0554   VLDL 15 10/08/2012 0554   LDLCALC 57 10/08/2012 0554    PHYSICAL EXAM:    VS:  BP 140/84 (BP Location: Left Arm, Patient Position: Sitting, Cuff Size: Normal)   Pulse 64   Ht 5\' 10"  (1.778 m)   Wt 210 lb 8 oz (95.5 kg)   SpO2 98%   BMI 30.20 kg/m   BMI: Body mass index is 30.2 kg/m.  Physical Exam  Constitutional: He is oriented to person, place, and time. He appears well-developed and well-nourished.  HENT:  Head: Normocephalic and atraumatic.  Eyes: Right eye exhibits no discharge. Left eye exhibits no discharge.  Neck: No JVD present.  Cardiovascular: Normal rate, regular rhythm, S1 normal, S2 normal and normal heart sounds. Exam reveals no distant heart sounds, no friction rub, no midsystolic click and no opening snap.  No murmur heard. Pulses:      Posterior tibial pulses are 2+ on the right side and 2+ on the left side.  Pulmonary/Chest: Effort normal and breath sounds normal. No respiratory distress. He has no decreased breath sounds. He has no wheezes. He has no rales. He exhibits no tenderness.  Abdominal: Soft. He exhibits no distension. There is no abdominal tenderness.  Musculoskeletal:        General: No edema.     Cervical back: Normal range of motion.  Neurological: He is alert  and oriented to person, place, and time.  Skin: Skin is warm and dry. No cyanosis. Nails show no clubbing.  Psychiatric: He has a normal mood and affect. His speech is normal and behavior is normal. Judgment and thought content normal.    Wt Readings from Last  3 Encounters:  07/31/19 210 lb 8 oz (95.5 kg)  07/03/19 205 lb 11.2 oz (93.3 kg)  02/23/19 203 lb 12.8 oz (92.4 kg)     ASSESSMENT & PLAN:   1. CAD involving the native coronary arteries without angina: He is doing well without any symptoms concerning for angina.  He has been restarted on aspirin since his last visit and tolerating this without issues.  Continue low-dose Toprol-XL and Lipitor.  Aggressive risk factor modification.  No plans for ischemic evaluation at this time.  2. PAF: He is maintaining sinus rhythm with well-controlled heart rate.  Continue Toprol-XL 12.5 mg daily.  He has not needed any as needed diltiazem.  Remains on Eliquis without any symptoms concerning for bleeding.  Stable hemoglobin as outlined above.  CHADS2VASc at least 4 (HTN, age x 2, vascular disease).  3. Bradycardia: Improved with lower dose Toprol-XL.  4. Dizziness/paresthesia: Imaging has been unrevealing as outlined above.  Followed by neurology with concern for vertigo, C-spine narrowing, and carpal tunnel disease playing a role in symptoms.  Follow-up with neurology/PCP as directed.  5. HTN: Blood pressure is mildly elevated today though historically has been well controlled.  In this setting, no changes were made at this time.  Continue to monitor.  6. HLD: LDL of 68 from 05/2019 with normal liver function at that time.  Remains on atorvastatin 40 mg daily.  7. Anemia: Most recent Hgb stable.  Disposition: F/u with Dr. Fletcher Anon or an APP in 6 months, sooner if needed.   Medication Adjustments/Labs and Tests Ordered: Current medicines are reviewed at length with the patient today.  Concerns regarding medicines are outlined above. Medication changes, Labs and Tests ordered today are summarized above and listed in the Patient Instructions accessible in Encounters.   Signed, Christell Faith, PA-C 07/31/2019 8:09 AM     [facility] [address] [place], [place] [address] [phone]

## 2019-07-31 ENCOUNTER — Other Ambulatory Visit: Payer: Self-pay

## 2019-07-31 ENCOUNTER — Encounter: Payer: Self-pay | Admitting: Physician Assistant

## 2019-07-31 ENCOUNTER — Ambulatory Visit (INDEPENDENT_AMBULATORY_CARE_PROVIDER_SITE_OTHER): Payer: PPO | Admitting: Physician Assistant

## 2019-07-31 VITALS — BP 140/84 | HR 64 | Ht 70.0 in | Wt 210.5 lb

## 2019-07-31 DIAGNOSIS — R001 Bradycardia, unspecified: Secondary | ICD-10-CM | POA: Diagnosis not present

## 2019-07-31 DIAGNOSIS — I10 Essential (primary) hypertension: Secondary | ICD-10-CM

## 2019-07-31 DIAGNOSIS — I2510 Atherosclerotic heart disease of native coronary artery without angina pectoris: Secondary | ICD-10-CM

## 2019-07-31 DIAGNOSIS — R202 Paresthesia of skin: Secondary | ICD-10-CM

## 2019-07-31 DIAGNOSIS — I480 Paroxysmal atrial fibrillation: Secondary | ICD-10-CM | POA: Diagnosis not present

## 2019-07-31 DIAGNOSIS — D649 Anemia, unspecified: Secondary | ICD-10-CM

## 2019-07-31 DIAGNOSIS — R42 Dizziness and giddiness: Secondary | ICD-10-CM | POA: Diagnosis not present

## 2019-07-31 DIAGNOSIS — E785 Hyperlipidemia, unspecified: Secondary | ICD-10-CM | POA: Diagnosis not present

## 2019-07-31 NOTE — Patient Instructions (Signed)
Medication Instructions:  Your physician recommends that you continue on your current medications as directed. Please refer to the Current Medication list given to you today.  *If you need a refill on your cardiac medications before your next appointment, please call your pharmacy*  Lab Work: None ordered  If you have labs (blood work) drawn today and your tests are completely normal, you will receive your results only by: Marland Kitchen MyChart Message (if you have MyChart) OR . A paper copy in the mail If you have any lab test that is abnormal or we need to change your treatment, we will call you to review the results.  Testing/Procedures: None ordered   Follow-Up: At [facility], you and your health needs are our priority.  As part of our continuing mission to provide you with exceptional heart care, we have created designated Provider Care Teams.  These Care Teams include your primary Cardiologist (physician) and Advanced Practice Providers (APPs -  Physician Assistants and Nurse Practitioners) who all work together to provide you with the care you need, when you need it.  Your next appointment:   6 month(s)  The format for your next appointment:   In Person  Provider:    You may see Kathlyn Sacramento, MD or Christell Faith, PA-C.

## 2019-08-05 ENCOUNTER — Other Ambulatory Visit: Payer: Self-pay | Admitting: Cardiovascular Disease

## 2019-08-16 ENCOUNTER — Other Ambulatory Visit: Payer: Self-pay | Admitting: Physician Assistant

## 2019-10-20 ENCOUNTER — Other Ambulatory Visit: Payer: Self-pay | Admitting: Cardiovascular Disease

## 2019-11-14 DIAGNOSIS — E7800 Pure hypercholesterolemia, unspecified: Secondary | ICD-10-CM | POA: Diagnosis not present

## 2019-11-14 DIAGNOSIS — Z79899 Other long term (current) drug therapy: Secondary | ICD-10-CM | POA: Diagnosis not present

## 2019-11-15 ENCOUNTER — Other Ambulatory Visit: Payer: Self-pay | Admitting: Physician Assistant

## 2019-11-20 DIAGNOSIS — I10 Essential (primary) hypertension: Secondary | ICD-10-CM | POA: Diagnosis not present

## 2019-11-20 DIAGNOSIS — R202 Paresthesia of skin: Secondary | ICD-10-CM | POA: Diagnosis not present

## 2019-11-20 DIAGNOSIS — E7800 Pure hypercholesterolemia, unspecified: Secondary | ICD-10-CM | POA: Diagnosis not present

## 2019-11-20 DIAGNOSIS — N1831 Chronic kidney disease, stage 3a: Secondary | ICD-10-CM | POA: Diagnosis not present

## 2019-11-20 DIAGNOSIS — I5032 Chronic diastolic (congestive) heart failure: Secondary | ICD-10-CM | POA: Diagnosis not present

## 2019-11-20 DIAGNOSIS — Z79899 Other long term (current) drug therapy: Secondary | ICD-10-CM | POA: Diagnosis not present

## 2019-11-20 DIAGNOSIS — Z125 Encounter for screening for malignant neoplasm of prostate: Secondary | ICD-10-CM | POA: Diagnosis not present

## 2019-11-20 DIAGNOSIS — K219 Gastro-esophageal reflux disease without esophagitis: Secondary | ICD-10-CM | POA: Diagnosis not present

## 2019-11-20 DIAGNOSIS — I480 Paroxysmal atrial fibrillation: Secondary | ICD-10-CM | POA: Diagnosis not present

## 2019-11-20 DIAGNOSIS — I2510 Atherosclerotic heart disease of native coronary artery without angina pectoris: Secondary | ICD-10-CM | POA: Diagnosis not present

## 2020-01-20 ENCOUNTER — Other Ambulatory Visit: Payer: Self-pay | Admitting: Cardiovascular Disease

## 2020-01-23 ENCOUNTER — Emergency Department: Payer: PPO

## 2020-01-23 ENCOUNTER — Other Ambulatory Visit: Payer: Self-pay

## 2020-01-23 ENCOUNTER — Encounter: Payer: Self-pay | Admitting: Emergency Medicine

## 2020-01-23 DIAGNOSIS — Z79899 Other long term (current) drug therapy: Secondary | ICD-10-CM | POA: Diagnosis not present

## 2020-01-23 DIAGNOSIS — I10 Essential (primary) hypertension: Secondary | ICD-10-CM | POA: Diagnosis not present

## 2020-01-23 DIAGNOSIS — Z7982 Long term (current) use of aspirin: Secondary | ICD-10-CM | POA: Insufficient documentation

## 2020-01-23 DIAGNOSIS — R2232 Localized swelling, mass and lump, left upper limb: Secondary | ICD-10-CM | POA: Diagnosis not present

## 2020-01-23 DIAGNOSIS — Z7901 Long term (current) use of anticoagulants: Secondary | ICD-10-CM | POA: Insufficient documentation

## 2020-01-23 DIAGNOSIS — M7989 Other specified soft tissue disorders: Secondary | ICD-10-CM | POA: Diagnosis not present

## 2020-01-23 DIAGNOSIS — M19022 Primary osteoarthritis, left elbow: Secondary | ICD-10-CM | POA: Diagnosis not present

## 2020-01-23 DIAGNOSIS — S5002XA Contusion of left elbow, initial encounter: Secondary | ICD-10-CM | POA: Diagnosis not present

## 2020-01-23 DIAGNOSIS — S40022A Contusion of left upper arm, initial encounter: Secondary | ICD-10-CM | POA: Diagnosis not present

## 2020-01-23 NOTE — ED Triage Notes (Signed)
Pt presents to ED with bruising to his left arm near his elbow. Pt states he was working in the yard digging holes Saturday and noticed tenderness to his elbow that night with slight bruising noted Sunday. Affected area has continued to become larger in diameter and darken in color with slight swelling noted. No known injury. Elbow joint is painful with movement.

## 2020-01-24 ENCOUNTER — Emergency Department: Payer: PPO

## 2020-01-24 ENCOUNTER — Emergency Department
Admission: EM | Admit: 2020-01-24 | Discharge: 2020-01-24 | Disposition: A | Discharge status: Home / Self Care | Payer: PPO | Attending: Emergency Medicine | Admitting: Emergency Medicine

## 2020-01-24 DIAGNOSIS — M7989 Other specified soft tissue disorders: Secondary | ICD-10-CM

## 2020-01-24 DIAGNOSIS — S40022A Contusion of left upper arm, initial encounter: Secondary | ICD-10-CM | POA: Diagnosis not present

## 2020-01-24 DIAGNOSIS — S5002XA Contusion of left elbow, initial encounter: Secondary | ICD-10-CM | POA: Diagnosis not present

## 2020-01-24 LAB — CBC WITH DIFFERENTIAL/PLATELET
Abs Immature Granulocytes: 0.03 10*3/uL (ref 0.00–0.07)
Basophils Absolute: 0.1 10*3/uL (ref 0.0–0.1)
Basophils Relative: 1 %
Eosinophils Absolute: 0.4 10*3/uL (ref 0.0–0.5)
Eosinophils Relative: 4 %
HCT: 38.8 % — ABNORMAL LOW (ref 39.0–52.0)
Hemoglobin: 13.1 g/dL (ref 13.0–17.0)
Immature Granulocytes: 0 %
Lymphocytes Relative: 15 %
Lymphs Abs: 1.2 10*3/uL (ref 0.7–4.0)
MCH: 30.8 pg (ref 26.0–34.0)
MCHC: 33.8 g/dL (ref 30.0–36.0)
MCV: 91.3 fL (ref 80.0–100.0)
Monocytes Absolute: 1 10*3/uL (ref 0.1–1.0)
Monocytes Relative: 12 %
Neutro Abs: 5.7 10*3/uL (ref 1.7–7.7)
Neutrophils Relative %: 68 %
Platelets: 268 10*3/uL (ref 150–400)
RBC: 4.25 MIL/uL (ref 4.22–5.81)
RDW: 13.3 % (ref 11.5–15.5)
WBC: 8.4 10*3/uL (ref 4.0–10.5)
nRBC: 0 % (ref 0.0–0.2)

## 2020-01-24 LAB — COMPREHENSIVE METABOLIC PANEL
ALT: 17 U/L (ref 0–44)
AST: 23 U/L (ref 15–41)
Albumin: 4.8 g/dL (ref 3.5–5.0)
Alkaline Phosphatase: 79 U/L (ref 38–126)
Anion gap: 7 (ref 5–15)
BUN: 18 mg/dL (ref 8–23)
CO2: 29 mmol/L (ref 22–32)
Calcium: 9.2 mg/dL (ref 8.9–10.3)
Chloride: 103 mmol/L (ref 98–111)
Creatinine, Ser: 1.41 mg/dL — ABNORMAL HIGH (ref 0.61–1.24)
GFR calc Af Amer: 52 mL/min — ABNORMAL LOW (ref >60)
GFR calc non Af Amer: 44 mL/min — ABNORMAL LOW (ref >60)
Glucose, Bld: 104 mg/dL — ABNORMAL HIGH (ref 70–99)
Potassium: 4 mmol/L (ref 3.5–5.1)
Sodium: 139 mmol/L (ref 135–145)
Total Bilirubin: 0.6 mg/dL (ref 0.3–1.2)
Total Protein: 7.6 g/dL (ref 6.5–8.1)

## 2020-01-24 LAB — PROTIME-INR
INR: 1 (ref 0.8–1.2)
Prothrombin Time: 12.6 seconds (ref 11.4–15.2)

## 2020-01-24 LAB — APTT: aPTT: 28 seconds (ref 24–36)

## 2020-01-24 NOTE — Discharge Instructions (Addendum)
Your ultrasounds were reassuring your labs are reassuring.  We are putting a compressive wrap around this that you can leave on for the next 2 to 3 days.  Feel free to take it off and bend your elbow and take a shower and put it back on.  It is not getting better you can follow-up with orthopedic surgery.  Take Tylenol 1 g every 8 hours to help with pain   IMPRESSION:  Subcutaneous edema at the level of clinical swelling/bruising. No  hematoma or other collection.   IMPRESSION:  No evidence of DVT within the left upper extremity.

## 2020-01-24 NOTE — ED Notes (Signed)
Pt's arm wrapped per MD

## 2020-01-24 NOTE — ED Provider Notes (Signed)
[facility] Emergency Department Provider Note  ____________________________________________   First MD Initiated Contact with Patient 01/24/20 0259     (approximate)  I have reviewed the triage vital signs and the nursing notes.   HISTORY  Chief Complaint Arm Pain and Bleeding/Bruising    HPI Benjamin Brewer is a 86 y.o. male with A. fib on Eliquis who comes in for left arm pain and swelling.  Patient reports having 2 days of worsening swelling around the medial side of his left elbow.  The swelling has been moderate, constant, nothing makes better, nothing makes it worse.  He denies ever having any falls recently.  States that he was doing a lot of work outside.  He states the pain initially started at the elbow.  Denies any fevers.          Past Medical History:  Diagnosis Date  . A-fib ([place])   . Arthritis    hands  . Benign prostatic hypertrophy    Hx of   . CAD (coronary artery disease)    native vessel. Status post LAD and RCA angioplasty and drug-eluting stent placement in 2009  . Dental bridge present    implants, no bridge  . Dizziness   . Dyslipidemia   . GERD (gastroesophageal reflux disease)   . History of tachycardia   . Hyperlipidemia   . Hypertension   . Intermediate coronary syndrome (HCC)    angina, unstable  . Numbness and tingling of both lower extremities   . Other dyspnea and respiratory abnormality    on exertion  . Pinched nerve in neck    some discomfort with side to side movement  . Vertigo    approx 1x/month    Patient Active Problem List   Diagnosis Date Noted  . Paresthesia of both feet 01/09/2019  . Acute renal failure superimposed on stage 3 chronic kidney disease ([place]) 01/14/2018  . Left arm numbness 01/14/2018  . Generalized weakness 01/14/2018  . Left shoulder pain   . Atrial fibrillation ([place]) 08/20/2014  . BPPV (benign paroxysmal positional vertigo) 10/23/2013  . Sinus bradycardia 06/16/2013    . Essential hypertension 06/16/2013  . CAD, NATIVE VESSEL 07/03/2009  . DYSPNEA ON EXERTION 12/21/2008  . Hyperlipidemia 12/15/2008  . BENIGN PROSTATIC HYPERTROPHY, HX OF 12/15/2008    Past Surgical History:  Procedure Laterality Date  . [place]; x2 (last stent 2009)  . [place]  . CATARACT EXTRACTION W/PHACO Left 07/03/2019   Procedure: CATARACT EXTRACTION PHACO AND INTRAOCULAR LENS PLACEMENT (IOC) LEFT 10.19  01:06.9;  Surgeon: Eulogio Bear, MD;  Location: [place];  Service: Ophthalmology;  Laterality: Left;  . ESOPHAGOGASTRODUODENOSCOPY (EGD) WITH PROPOFOL N/A 06/10/2015   Procedure: ESOPHAGOGASTRODUODENOSCOPY (EGD) WITH PROPOFOL;  Surgeon: Manya Silvas, MD;  Location: [facility] ENDOSCOPY;  Service: Endoscopy;  Laterality: N/A;  . HERNIA REPAIR    . SAVORY DILATION N/A 06/10/2015   Procedure: SAVORY DILATION;  Surgeon: Manya Silvas, MD;  Location: [facility] ENDOSCOPY;  Service: Endoscopy;  Laterality: N/A;  . TONSILLECTOMY      Prior to Admission medications   Medication Sig Start Date End Date Taking? Authorizing Provider  ASPIRIN 81 PO Take by mouth daily.    [provider]  atorvastatin (LIPITOR) 40 MG tablet TAKE 1 TABLET BY MOUTH EVERY MORNING 01/22/20   Wellington Hampshire, MD  cyanocobalamin 500 MCG tablet Take 2,500 mcg by mouth daily.     [provider]  diltiazem (CARDIZEM) 30 MG tablet TAKE ONE TABLET EVERY 4 HOURS AS NEEDED FOR AFIB HEART RATE >100 01/09/19   Sherran Needs, NP  ELIQUIS 5 MG TABS tablet TAKE 1 TABLET BY MOUTH TWICE DAILY 01/31/18   Rise Mu, PA-C  ferrous sulfate (FERRO-BOB) 325 (65 FE) MG tablet Take 325 mg by mouth daily.      [provider]  finasteride (PROSCAR) 5 MG tablet Take 5 mg by mouth daily. 09/28/18   [provider]  furosemide (LASIX) 20 MG tablet Take 1 tablet (20 mg total) by mouth  daily as needed for fluid or edema. 01/15/18   Elgergawy, Silver Huguenin, MD  losartan (COZAAR) 25 MG tablet TAKE ONE TABLET EVERY DAY 11/15/19   Rise Mu, PA-C  metoprolol succinate (TOPROL-XL) 25 MG 24 hr tablet Take 0.5 tablets (12.5 mg total) by mouth daily. 01/24/19   Rise Mu, PA-C  Multiple Vitamin (MULTIVITAMIN) tablet Take 1 tablet by mouth daily.    [provider]  Multiple Vitamins-Minerals (EYE VITAMINS PO) Take 1 tablet by mouth 2 (two) times daily.     [provider]  nitroGLYCERIN (NITROSTAT) 0.4 MG SL tablet place 1 tablet under the tongue if needed every 5 minutes fo 03/29/17   Wellington Hampshire, MD  pantoprazole (PROTONIX) 40 MG tablet Take 1 tablet (40 mg total) by mouth daily. 08/07/19   Wellington Hampshire, MD    Allergies Prednisone and Terazosin  Family History  Problem Relation Age of Onset  . Stroke Mother   . Heart attack Mother   . Hypertension Mother   . Stroke Other        family history    Social History Social History   Tobacco Use  . Smoking status: Never Smoker  . Smokeless tobacco: Never Used  Vaping Use  . Vaping Use: Never used  Substance Use Topics  . Alcohol use: Yes    Alcohol/week: 1.0 standard drink    Types: 1 Glasses of wine per week    Comment: occasional   . Drug use: No      Review of Systems Constitutional: No fever/chills Eyes: No visual changes. ENT: No sore throat. Cardiovascular: Denies chest pain. Respiratory: Denies shortness of breath. Gastrointestinal: No abdominal pain.  No nausea, no vomiting.  No diarrhea.  No constipation. Genitourinary: Negative for dysuria. Musculoskeletal: Negative for back pain.  Pain to the left arm Skin: Negative for rash. Neurological: Negative for headaches, focal weakness or numbness. All other ROS negative ____________________________________________   PHYSICAL EXAM:  VITAL SIGNS: ED Triage Vitals  Enc Vitals Group     BP 01/23/20 2213 (!) 171/92     Pulse  Rate 01/23/20 2213 70     Resp 01/23/20 2213 18     Temp 01/23/20 2213 98.4 F (36.9 C)     Temp Source 01/23/20 2213 Oral     SpO2 01/23/20 2213 97 %     Weight 01/23/20 2214 210 lb (95.3 kg)     Height 01/23/20 2214 5\' 10"  (1.778 m)     Head Circumference --      Peak Flow --      Pain Score 01/23/20 2214 3     Pain Loc --      Pain Edu? --      Excl. in [place]? --     Constitutional: Alert and  oriented. Well appearing and in no acute distress. Eyes: Conjunctivae are normal. EOMI. Head: Atraumatic. Nose: No congestion/rhinnorhea. Mouth/Throat: Mucous membranes are moist.   Neck: No stridor. Trachea Midline. FROM Cardiovascular: Normal rate, regular rhythm. Grossly normal heart sounds.  Good peripheral circulation. Respiratory: Normal respiratory effort.  No retractions. Lungs CTAB. Gastrointestinal: Soft and nontender. No distention. No abdominal bruits.  Musculoskeletal: Swelling and bruising noted to the left arm mostly around the elbow and into the medial aspect of the arm.  2+ distal pulse.  Sensation intact.  Full range of motion.  No warmth or redness Neurologic:  Normal speech and language. No gross focal neurologic deficits are appreciated.  Skin:  Skin is warm, dry and intact. No rash noted. Psychiatric: Mood and affect are normal. Speech and behavior are normal. GU: Deferred   ____________________________________________   LABS (all labs ordered are listed, but only abnormal results are displayed)  Labs Reviewed  CBC WITH DIFFERENTIAL/PLATELET - Abnormal; Notable for the following components:      Result Value   HCT 38.8 (*)    All other components within normal limits  COMPREHENSIVE METABOLIC PANEL - Abnormal; Notable for the following components:   Glucose, Bld 104 (*)    Creatinine, Ser 1.41 (*)    GFR calc non Af Amer 44 (*)    GFR calc Af Amer 52 (*)    All other components within normal limits  PROTIME-INR  APTT    ____________________________________________   RADIOLOGY Robert Bellow, personally viewed and evaluated these images (plain radiographs) as part of my medical decision making, as well as reviewing the written report by the radiologist.  ED MD interpretation: Soft tissue swelling without fracture  Official radiology report(s): DG Elbow Complete Left  Result Date: 01/23/2020 CLINICAL DATA:  Pain and swelling EXAM: LEFT ELBOW - COMPLETE 3+ VIEW COMPARISON:  None. FINDINGS: Frontal, bilateral oblique, and lateral views of the left elbow demonstrate no fractures. Alignment is anatomic. Mild degenerative changes along the medial humeral epicondyle. There is extensive soft tissue swelling most pronounced within the dorsal and medial aspect of the left elbow. No joint effusion. IMPRESSION: 1. Extensive soft tissue swelling. 2. Mild degenerative change, no acute fracture. Electronically Signed   By: Randa Ngo M.D.   On: 01/23/2020 23:41    ____________________________________________   PROCEDURES  Procedure(s) performed (including Critical Care):  Procedures   ____________________________________________   INITIAL IMPRESSION / ASSESSMENT AND PLAN / ED COURSE  Benjamin Brewer was evaluated in Emergency Department on 01/24/2020 for the symptoms described in the history of present illness. He was evaluated in the context of the global COVID-19 pandemic, which necessitated consideration that the patient might be at risk for infection with the SARS-CoV-2 virus that causes COVID-19. Institutional protocols and algorithms that pertain to the evaluation of patients at risk for COVID-19 are in a state of rapid change based on information released by regulatory bodies including the CDC and federal and state organizations. These policies and algorithms were followed during the patient's care in the ED.    Patient is a very well-appearing 86 year old who comes in with left arm swelling and  bruising.  Patient is on blood thinner.  Suspect this is most likely hematoma although he denies any specific falls or injuries could be from stress like working.  X-ray was negative for fracture.  Labs evaluate for anemia, coagulopathy.  Labs are reassuring.  No evidence of anemia.  Coags are normal.  The patient has been in  the ER for over 8 hours and has not significantly grown in size.  Ultrasounds was ordered to evaluate for DVT versus abscess versus hematoma.  Ultrasound does not show evidence of DVT, no large fluid collection.  Repeat evaluation the size is stable in nature.  There is no evidence of compartment syndrome.  Will provide a compressive wrap and have patient take Tylenol for pain and follow-up with orthopedic surgery.  I discussed the provisional nature of ED diagnosis, the treatment so far, the ongoing plan of care, follow up appointments and return precautions with the patient and any family or support people present. They expressed understanding and agreed with the plan, discharged home.    ____________________________________________   FINAL CLINICAL IMPRESSION(S) / ED DIAGNOSES   Final diagnoses:  Left arm swelling      MEDICATIONS GIVEN DURING THIS VISIT:  Medications - No data to display   ED Discharge Orders    None       Note:  This document was prepared using Dragon voice recognition software and may include unintentional dictation errors.   Vanessa Englewood, MD 01/24/20 [phone]

## 2020-01-25 ENCOUNTER — Encounter: Payer: Self-pay | Admitting: Cardiovascular Disease

## 2020-01-25 ENCOUNTER — Ambulatory Visit (INDEPENDENT_AMBULATORY_CARE_PROVIDER_SITE_OTHER): Payer: PPO | Admitting: Cardiovascular Disease

## 2020-01-25 ENCOUNTER — Other Ambulatory Visit: Payer: Self-pay

## 2020-01-25 VITALS — BP 130/70 | HR 66 | Ht 70.0 in | Wt 201.2 lb

## 2020-01-25 DIAGNOSIS — I2510 Atherosclerotic heart disease of native coronary artery without angina pectoris: Secondary | ICD-10-CM | POA: Diagnosis not present

## 2020-01-25 DIAGNOSIS — E7800 Pure hypercholesterolemia, unspecified: Secondary | ICD-10-CM | POA: Diagnosis not present

## 2020-01-25 DIAGNOSIS — I480 Paroxysmal atrial fibrillation: Secondary | ICD-10-CM

## 2020-01-25 DIAGNOSIS — I10 Essential (primary) hypertension: Secondary | ICD-10-CM | POA: Diagnosis not present

## 2020-01-25 NOTE — Progress Notes (Signed)
Cardiology Office Note   Date:  01/25/2020   ID:  MAVIN DYKE, DOB 07/18/1930, MRN [id number]  PCP:  Derinda Late, MD  Cardiologist:   Kathlyn Sacramento, MD   Chief Complaint  Patient presents with   OTHER    6 month f/u c/o flunctuating BP. Meds reviewed verbally with pt.      History of Present Illness: Benjamin Brewer is a 86 y.o. male who presents for a followup visit regarding coronary artery disease and paroxysmal atrial fibrillation . He is status post angioplasty and drug-eluting stent placement to the LAD and RCA in 10-11-2007.  He was diagnosed with atrial fibrillation with rapid ventricular response in 2013-10-10 and has been on anticoagulation since then.  Echocardiogram in July 2019 showed EF of 60 to 65%, calcified aortic valve without significant stenosis, mildly dilated left atrium and no significant pulmonary hypertension. The patient's wife died in 2017/10/10 and he had significant grief, anxiety and depression since then. He was diagnosed with cervical spine disease in Oct 11, 2018.  Treatment with prednisone was associated with worsening atrial fibrillation. He hit his left elbow recently and went to the emergency room due to significant pain and swelling.  X-ray showed no evidence of fractures but there was soft tissue swelling.  Ultrasound showed no fluid collection or DVT.  His labs were unremarkable.  He started icing it and wrapped it and it feels better today.  He has been doing reasonably well from a cardiac standpoint with minimal palpitations.  No shortness of breath or chest pain.  Past Medical History:  Diagnosis Date   A-fib [facility])    Arthritis    hands   Benign prostatic hypertrophy    Hx of    CAD (coronary artery disease)    native vessel. Status post LAD and RCA angioplasty and drug-eluting stent placement in 2007/10/11   Dental bridge present    implants, no bridge   Dizziness    Dyslipidemia    GERD (gastroesophageal reflux disease)    History of  tachycardia    Hyperlipidemia    Hypertension    Intermediate coronary syndrome (HCC)    angina, unstable   Numbness and tingling of both lower extremities    Other dyspnea and respiratory abnormality    on exertion   Pinched nerve in neck    some discomfort with side to side movement   Vertigo    approx 1x/month    Past Surgical History:  Procedure Laterality Date   [place]; x2 (last stent Oct 11, 2007)   [place] W/PHACO Left 07/03/2019   Procedure: CATARACT EXTRACTION PHACO AND INTRAOCULAR LENS PLACEMENT ([place]) LEFT 10.19  01:06.9;  Surgeon: Eulogio Bear, MD;  Location: [place];  Service: Ophthalmology;  Laterality: Left;   ESOPHAGOGASTRODUODENOSCOPY (EGD) WITH PROPOFOL N/A 06/10/2015   Procedure: ESOPHAGOGASTRODUODENOSCOPY (EGD) WITH PROPOFOL;  Surgeon: Manya Silvas, MD;  Location: [facility] ENDOSCOPY;  Service: Endoscopy;  Laterality: N/A;   HERNIA REPAIR     SAVORY DILATION N/A 06/10/2015   Procedure: SAVORY DILATION;  Surgeon: Manya Silvas, MD;  Location: [facility] ENDOSCOPY;  Service: Endoscopy;  Laterality: N/A;   TONSILLECTOMY       Current Outpatient Medications  Medication Sig Dispense Refill   ASPIRIN 81 PO Take by mouth daily.     atorvastatin (LIPITOR) 40 MG tablet TAKE  1 TABLET BY MOUTH EVERY MORNING 90 tablet 0   cyanocobalamin 500 MCG tablet Take 2,500 mcg by mouth daily.      diltiazem (CARDIZEM) 30 MG tablet TAKE ONE TABLET EVERY 4 HOURS AS NEEDED FOR AFIB HEART RATE >100 45 tablet 1   ELIQUIS 5 MG TABS tablet TAKE 1 TABLET BY MOUTH TWICE DAILY 60 tablet 6   ferrous sulfate (FERRO-BOB) 325 (65 FE) MG tablet Take 325 mg by mouth daily.       finasteride (PROSCAR) 5 MG tablet Take 5 mg by mouth daily.     furosemide (LASIX) 20 MG tablet Take 1 tablet (20 mg total) by mouth daily as needed for fluid or edema.  30 tablet 0   losartan (COZAAR) 25 MG tablet TAKE ONE TABLET EVERY DAY 90 tablet 0   metoprolol succinate (TOPROL-XL) 25 MG 24 hr tablet Take 0.5 tablets (12.5 mg total) by mouth daily. 45 tablet 3   Multiple Vitamin (MULTIVITAMIN) tablet Take 1 tablet by mouth daily.     Multiple Vitamins-Minerals (EYE VITAMINS PO) Take 1 tablet by mouth 2 (two) times daily.      nitroGLYCERIN (NITROSTAT) 0.4 MG SL tablet place 1 tablet under the tongue if needed every 5 minutes fo 25 tablet 3   pantoprazole (PROTONIX) 40 MG tablet Take 1 tablet (40 mg total) by mouth daily. 30 tablet 5   No current facility-administered medications for this visit.    Allergies:   Prednisone and Terazosin    Social History:  The patient  reports that he has never smoked. He has never used smokeless tobacco. He reports current alcohol use of about 1.0 standard drink of alcohol per week. He reports that he does not use drugs.   Family History:  The patient's family history includes Heart attack in his mother; Hypertension in his mother; Stroke in his mother and another family member.    ROS:  Please see the history of present illness.   Otherwise, review of systems are positive for none.   All other systems are reviewed and negative.    PHYSICAL EXAM: VS:  BP 130/70 (BP Location: Left Arm, Patient Position: Sitting, Cuff Size: Normal)    Pulse 66    Ht 5\' 10"  (1.778 m)    Wt 201 lb 4 oz (91.3 kg)    SpO2 97%    BMI 28.88 kg/m  , BMI Body mass index is 28.88 kg/m. GEN: Well nourished, well developed, in no acute distress  HEENT: normal  Neck: no JVD, carotid bruits, or masses Cardiac: RRR; no  rubs, or gallops,no edema .  1 out of 6 systolic murmur at the aortic area Respiratory:  clear to auscultation bilaterally, normal work of breathing GI: soft, nontender, nondistended, + BS MS: no deformity or atrophy  Skin: warm and dry, no rash Neuro:  Strength and sensation are intact Psych: euthymic mood, full  affect   EKG:  EKG is ordered today. The ekg ordered today demonstrates normal sinus rhythm with low voltage, poor R wave progression in the anterior lead and possible old inferior infarct.  Recent Labs: 01/24/2020: ALT 17; BUN 18; Creatinine, Ser 1.41; Hemoglobin 13.1; Platelets 268; Potassium 4.0; Sodium 139    Lipid Panel    Component Value Date/Time   CHOL 115 10/08/2012 0554   TRIG 75 10/08/2012 0554   HDL 43 10/08/2012 0554   VLDL 15 10/08/2012 0554   LDLCALC 57 10/08/2012 0554      Wt Readings from Last  3 Encounters:  01/25/20 201 lb 4 oz (91.3 kg)  01/23/20 210 lb (95.3 kg)  07/31/19 210 lb 8 oz (95.5 kg)        ASSESSMENT AND PLAN:  1.  Coronary artery disease involving native coronary arteries without angina:  Continue medical therapy. He is not on antiplatelet medications given that he is on long-term anticoagulation.  2. Paroxysmal atrial fibrillation:  Overall controlled with small dose Toprol.  He is tolerating anticoagulation with Eliquis.  I reviewed his labs from yesterday which showed stable renal function and unremarkable CBC.  Creatinine continues to be below 1.5.  Thus, no need to decrease the dose of Eliquis.  3. Essential hypertension: Blood pressures well controlled on current medications.  4. Hyperlipidemia: Continue treatment with atorvastatin .   Most recent lipid profile in May showed an LDL of 78 but usually is LDL below 70.  I made no changes.    Disposition:   FU with me in 6 months  Signed,  Kathlyn Sacramento, MD  01/25/2020 8:20 AM    [place]

## 2020-01-25 NOTE — Patient Instructions (Signed)
Medication Instructions:  No changes  *If you need a refill on your cardiac medications before your next appointment, please call your pharmacy*   Lab Work: None  If you have labs (blood work) drawn today and your tests are completely normal, you will receive your results only by: . MyChart Message (if you have MyChart) OR . A paper copy in the mail If you have any lab test that is abnormal or we need to change your treatment, we will call you to review the results.   Testing/Procedures: None   Follow-Up: At [facility], you and your health needs are our priority.  As part of our continuing mission to provide you with exceptional heart care, we have created designated Provider Care Teams.  These Care Teams include your primary Cardiologist (physician) and Advanced Practice Providers (APPs -  Physician Assistants and Nurse Practitioners) who all work together to provide you with the care you need, when you need it.  We recommend signing up for the patient portal called "MyChart".  Sign up information is provided on this After Visit Summary.  MyChart is used to connect with patients for Virtual Visits (Telemedicine).  Patients are able to view lab/test results, encounter notes, upcoming appointments, etc.  Non-urgent messages can be sent to your provider as well.   To learn more about what you can do with MyChart, go to [email or web address].    Your next appointment:   6 month(s)  The format for your next appointment:   In Person  Provider:    You may see Muhammad Arida, MD or one of the following Advanced Practice Providers on your designated Care Team:    Christopher Berge, NP  Ryan Dunn, PA-C  Jacquelyn Visser, PA-C  

## 2020-01-26 DIAGNOSIS — M7022 Olecranon bursitis, left elbow: Secondary | ICD-10-CM | POA: Diagnosis not present

## 2020-01-26 DIAGNOSIS — S46302A Unspecified injury of muscle, fascia and tendon of triceps, left arm, initial encounter: Secondary | ICD-10-CM | POA: Diagnosis not present

## 2020-02-01 ENCOUNTER — Other Ambulatory Visit: Payer: Self-pay | Admitting: Cardiovascular Disease

## 2020-02-28 DIAGNOSIS — H353133 Nonexudative age-related macular degeneration, bilateral, advanced atrophic without subfoveal involvement: Secondary | ICD-10-CM | POA: Diagnosis not present

## 2020-03-13 DIAGNOSIS — L739 Follicular disorder, unspecified: Secondary | ICD-10-CM | POA: Diagnosis not present

## 2020-03-28 DIAGNOSIS — L738 Other specified follicular disorders: Secondary | ICD-10-CM | POA: Diagnosis not present

## 2020-04-23 ENCOUNTER — Other Ambulatory Visit: Payer: Self-pay | Admitting: Cardiovascular Disease

## 2020-04-23 ENCOUNTER — Encounter (HOSPITAL_COMMUNITY): Payer: Self-pay

## 2020-04-23 ENCOUNTER — Emergency Department (HOSPITAL_COMMUNITY)
Admission: EM | Admit: 2020-04-23 | Discharge: 2020-04-24 | Disposition: A | Discharge status: Home / Self Care | Payer: PPO | Attending: Emergency Medicine | Admitting: Emergency Medicine

## 2020-04-23 ENCOUNTER — Emergency Department (HOSPITAL_COMMUNITY): Payer: PPO

## 2020-04-23 DIAGNOSIS — R0789 Other chest pain: Secondary | ICD-10-CM | POA: Diagnosis not present

## 2020-04-23 DIAGNOSIS — N183 Chronic kidney disease, stage 3 unspecified: Secondary | ICD-10-CM | POA: Insufficient documentation

## 2020-04-23 DIAGNOSIS — R079 Chest pain, unspecified: Secondary | ICD-10-CM | POA: Diagnosis not present

## 2020-04-23 DIAGNOSIS — Z79899 Other long term (current) drug therapy: Secondary | ICD-10-CM | POA: Diagnosis not present

## 2020-04-23 DIAGNOSIS — I4891 Unspecified atrial fibrillation: Secondary | ICD-10-CM | POA: Diagnosis not present

## 2020-04-23 DIAGNOSIS — I129 Hypertensive chronic kidney disease with stage 1 through stage 4 chronic kidney disease, or unspecified chronic kidney disease: Secondary | ICD-10-CM | POA: Diagnosis not present

## 2020-04-23 DIAGNOSIS — I208 Other forms of angina pectoris: Secondary | ICD-10-CM

## 2020-04-23 DIAGNOSIS — I209 Angina pectoris, unspecified: Secondary | ICD-10-CM | POA: Diagnosis not present

## 2020-04-23 DIAGNOSIS — Z7982 Long term (current) use of aspirin: Secondary | ICD-10-CM | POA: Diagnosis not present

## 2020-04-23 DIAGNOSIS — Z7901 Long term (current) use of anticoagulants: Secondary | ICD-10-CM | POA: Insufficient documentation

## 2020-04-23 DIAGNOSIS — I25118 Atherosclerotic heart disease of native coronary artery with other forms of angina pectoris: Secondary | ICD-10-CM | POA: Diagnosis not present

## 2020-04-23 DIAGNOSIS — I480 Paroxysmal atrial fibrillation: Secondary | ICD-10-CM | POA: Diagnosis not present

## 2020-04-23 DIAGNOSIS — Z8679 Personal history of other diseases of the circulatory system: Secondary | ICD-10-CM | POA: Diagnosis not present

## 2020-04-23 LAB — BASIC METABOLIC PANEL
Anion gap: 9 (ref 5–15)
BUN: 20 mg/dL (ref 8–23)
CO2: 24 mmol/L (ref 22–32)
Calcium: 9 mg/dL (ref 8.9–10.3)
Chloride: 104 mmol/L (ref 98–111)
Creatinine, Ser: 1.45 mg/dL — ABNORMAL HIGH (ref 0.61–1.24)
GFR, Estimated: 43 mL/min — ABNORMAL LOW (ref >60)
Glucose, Bld: 113 mg/dL — ABNORMAL HIGH (ref 70–99)
Potassium: 3.8 mmol/L (ref 3.5–5.1)
Sodium: 137 mmol/L (ref 135–145)

## 2020-04-23 LAB — CBC
HCT: 36.7 % — ABNORMAL LOW (ref 39.0–52.0)
Hemoglobin: 11.6 g/dL — ABNORMAL LOW (ref 13.0–17.0)
MCH: 30.2 pg (ref 26.0–34.0)
MCHC: 31.6 g/dL (ref 30.0–36.0)
MCV: 95.6 fL (ref 80.0–100.0)
Platelets: 253 10*3/uL (ref 150–400)
RBC: 3.84 MIL/uL — ABNORMAL LOW (ref 4.22–5.81)
RDW: 13.3 % (ref 11.5–15.5)
WBC: 7.3 10*3/uL (ref 4.0–10.5)
nRBC: 0 % (ref 0.0–0.2)

## 2020-04-23 NOTE — ED Triage Notes (Signed)
Pt presents to ED per referral from u/c for chest pain.  Onset today pt had chest pain and "heart racing" that lasted 30 minutes.  Denies chest pain, shortness of breath, dizziness, or fever.

## 2020-04-24 DIAGNOSIS — I480 Paroxysmal atrial fibrillation: Secondary | ICD-10-CM | POA: Diagnosis not present

## 2020-04-24 DIAGNOSIS — I209 Angina pectoris, unspecified: Secondary | ICD-10-CM | POA: Diagnosis not present

## 2020-04-24 LAB — TROPONIN I (HIGH SENSITIVITY)
Troponin I (High Sensitivity): 29 ng/L — ABNORMAL HIGH (ref <18)
Troponin I (High Sensitivity): 27 ng/L — ABNORMAL HIGH (ref <18)

## 2020-04-24 LAB — MAGNESIUM: Magnesium: 2.3 mg/dL (ref 1.7–2.4)

## 2020-04-24 NOTE — ED Provider Notes (Signed)
Care of the patient assumed at the change of shift. Patient with mild chest pain while in Afib/RVR, improved with rate control. Asymptomatic now. Awaiting second trop and plan discharge if not increasing.  Physical Exam  BP (!) 176/82 (BP Location: Right Arm)   Pulse 74   Temp 97.8 F (36.6 C) (Oral)   Resp 12   SpO2 100%   Physical Exam No distress.  Rate controlled  ED Course/Procedures   Clinical Course as of Apr 25 827  Wed Apr 24, 2020  0726 Spoke with Suanne Marker, cardiology.  Agrees to repeat troponin.  If stable or downtrending, likely outpatient cardiology follow-up.   [CH]    Clinical Course User Index [CH] Horton, Barbette Hair, MD    Procedures  MDM  Second Trop is improved. Per Cardiology, patient safe for outpatient follow up. He will call for an appointment. Otherwise he is feeling well and eager to go home.       Truddie Hidden, MD 04/24/20 [phone]

## 2020-04-24 NOTE — ED Provider Notes (Signed)
[facility] EMERGENCY DEPARTMENT Provider Note   CSN: [id number] Arrival date & time: 04/23/20  2027     History Chief Complaint  Patient presents with  . Atrial Fibrillation    Benjamin Brewer is a 86 y.o. male.  HPI     This is an 86 year old male with history of atrial fibrillation on Eliquis, coronary artery disease, hypertension, hyperlipidemia who presents with palpitations and chest pain.  Patient reports that he went into atrial fibrillation earlier yesterday.  At that time he describes having "angina" with his palpitations.  He states he had some anterior nonradiating chest pain.  He presented to urgent care and was told to come to the ER.  He had an EKG urgent care that I have reviewed that showed that he was in atrial fibrillation with a rate of 132.  He did take a dose of oral diltiazem.  He now tells me that he feels good and he feels like he is out of atrial fibrillation.  He is not having any ongoing chest pain or shortness of breath.  He is not had any recent fevers or cough.  He did receive his COVID-19 booster on Sunday.  Past Medical History:  Diagnosis Date  . A-fib ([place])   . Arthritis    hands  . Benign prostatic hypertrophy    Hx of   . CAD (coronary artery disease)    native vessel. Status post LAD and RCA angioplasty and drug-eluting stent placement in 2009  . Dental bridge present    implants, no bridge  . Dizziness   . Dyslipidemia   . GERD (gastroesophageal reflux disease)   . History of tachycardia   . Hyperlipidemia   . Hypertension   . Intermediate coronary syndrome (HCC)    angina, unstable  . Numbness and tingling of both lower extremities   . Other dyspnea and respiratory abnormality    on exertion  . Pinched nerve in neck    some discomfort with side to side movement  . Vertigo    approx 1x/month    Patient Active Problem List   Diagnosis Date Noted  . Paresthesia of both feet 01/09/2019  . Acute renal failure  superimposed on stage 3 chronic kidney disease ([place]) 01/14/2018  . Left arm numbness 01/14/2018  . Generalized weakness 01/14/2018  . Left shoulder pain   . Atrial fibrillation ([place]) 08/20/2014  . BPPV (benign paroxysmal positional vertigo) 10/23/2013  . Sinus bradycardia 06/16/2013  . Essential hypertension 06/16/2013  . CAD, NATIVE VESSEL 07/03/2009  . DYSPNEA ON EXERTION 12/21/2008  . Hyperlipidemia 12/15/2008  . BENIGN PROSTATIC HYPERTROPHY, HX OF 12/15/2008    Past Surgical History:  Procedure Laterality Date  . [place]; x2 (last stent 2009)  . [place]  . CATARACT EXTRACTION W/PHACO Left 07/03/2019   Procedure: CATARACT EXTRACTION PHACO AND INTRAOCULAR LENS PLACEMENT (IOC) LEFT 10.19  01:06.9;  Surgeon: Eulogio Bear, MD;  Location: [facility];  Service: Ophthalmology;  Laterality: Left;  . ESOPHAGOGASTRODUODENOSCOPY (EGD) WITH PROPOFOL N/A 06/10/2015   Procedure: ESOPHAGOGASTRODUODENOSCOPY (EGD) WITH PROPOFOL;  Surgeon: Manya Silvas, MD;  Location: [facility] ENDOSCOPY;  Service: Endoscopy;  Laterality: N/A;  . HERNIA REPAIR    . SAVORY DILATION N/A 06/10/2015   Procedure: SAVORY DILATION;  Surgeon: Manya Silvas, MD;  Location: [facility] ENDOSCOPY;  Service: Endoscopy;  Laterality: N/A;  .  TONSILLECTOMY         Family History  Problem Relation Age of Onset  . Stroke Mother   . Heart attack Mother   . Hypertension Mother   . Stroke Other        family history    Social History   Tobacco Use  . Smoking status: Never Smoker  . Smokeless tobacco: Never Used  Vaping Use  . Vaping Use: Never used  Substance Use Topics  . Alcohol use: Yes    Alcohol/week: 1.0 standard drink    Types: 1 Glasses of wine per week    Comment: occasional   . Drug use: No    Home Medications Prior to Admission medications   Medication Sig Start Date End Date Taking?  Authorizing Provider  ASPIRIN 81 PO Take by mouth daily.    [provider]  atorvastatin (LIPITOR) 40 MG tablet TAKE 1 TABLET BY MOUTH EVERY MORNING 04/23/20   Wellington Hampshire, MD  cyanocobalamin 500 MCG tablet Take 2,500 mcg by mouth daily.     [provider]  diltiazem (CARDIZEM) 30 MG tablet TAKE ONE TABLET EVERY 4 HOURS AS NEEDED FOR AFIB HEART RATE >100 01/09/19   Sherran Needs, NP  ELIQUIS 5 MG TABS tablet TAKE 1 TABLET BY MOUTH TWICE DAILY 01/31/18   Rise Mu, PA-C  ferrous sulfate (FERRO-BOB) 325 (65 FE) MG tablet Take 325 mg by mouth daily.      [provider]  finasteride (PROSCAR) 5 MG tablet Take 5 mg by mouth daily. 09/28/18   [provider]  furosemide (LASIX) 20 MG tablet Take 1 tablet (20 mg total) by mouth daily as needed for fluid or edema. 01/15/18   Elgergawy, Silver Huguenin, MD  losartan (COZAAR) 25 MG tablet TAKE ONE TABLET EVERY DAY 11/15/19   Rise Mu, PA-C  metoprolol succinate (TOPROL-XL) 25 MG 24 hr tablet Take 0.5 tablets (12.5 mg total) by mouth daily. 01/24/19   Rise Mu, PA-C  Multiple Vitamin (MULTIVITAMIN) tablet Take 1 tablet by mouth daily.    [provider]  Multiple Vitamins-Minerals (EYE VITAMINS PO) Take 1 tablet by mouth 2 (two) times daily.     [provider]  nitroGLYCERIN (NITROSTAT) 0.4 MG SL tablet place 1 tablet under the tongue if needed every 5 minutes fo 03/29/17   Wellington Hampshire, MD  pantoprazole (PROTONIX) 40 MG tablet TAKE ONE TABLET EVERY DAY 02/01/20   Wellington Hampshire, MD    Allergies    Prednisone and Terazosin  Review of Systems   Review of Systems  Constitutional: Negative for diaphoresis and fever.  Respiratory: Negative for shortness of breath.   Cardiovascular: Positive for chest pain and palpitations. Negative for leg swelling.  Gastrointestinal: Negative for abdominal pain, nausea and vomiting.  Genitourinary: Negative for dysuria.  All other systems reviewed and  are negative.   Physical Exam Updated Vital Signs BP (!) 176/82 (BP Location: Right Arm)   Pulse 74   Temp 97.8 F (36.6 C) (Oral)   Resp 12   SpO2 100%   Physical Exam Vitals and nursing note reviewed.  Constitutional:      Appearance: He is well-developed.     Comments: Elderly, nontoxic-appearing  HENT:     Head: Normocephalic and atraumatic.     Nose: Nose normal.     Mouth/Throat:     Mouth: Mucous membranes are moist.  Eyes:     Pupils: Pupils are equal, round, and  reactive to light.  Cardiovascular:     Rate and Rhythm: Normal rate and regular rhythm.     Heart sounds: Normal heart sounds. No murmur heard.   Pulmonary:     Effort: Pulmonary effort is normal. No respiratory distress.     Breath sounds: Normal breath sounds. No wheezing.  Abdominal:     General: Bowel sounds are normal.     Palpations: Abdomen is soft.     Tenderness: There is no abdominal tenderness. There is no rebound.  Musculoskeletal:     Cervical back: Neck supple.     Comments: Trace bilateral lower extremity edema  Lymphadenopathy:     Cervical: No cervical adenopathy.  Skin:    General: Skin is warm and dry.  Neurological:     Mental Status: He is alert and oriented to person, place, and time.  Psychiatric:        Mood and Affect: Mood normal.     ED Results / Procedures / Treatments   Labs (all labs ordered are listed, but only abnormal results are displayed) Labs Reviewed  BASIC METABOLIC PANEL - Abnormal; Notable for the following components:      Result Value   Glucose, Bld 113 (*)    Creatinine, Ser 1.45 (*)    GFR, Estimated 43 (*)    All other components within normal limits  CBC - Abnormal; Notable for the following components:   RBC 3.84 (*)    Hemoglobin 11.6 (*)    HCT 36.7 (*)    All other components within normal limits  TROPONIN I (HIGH SENSITIVITY) - Abnormal; Notable for the following components:   Troponin I (High Sensitivity) 29 (*)    All other  components within normal limits  MAGNESIUM  TROPONIN I (HIGH SENSITIVITY)    EKG EKG Interpretation  Date/Time:  Wednesday April 24 2020 04:44:26 EDT Ventricular Rate:  65 PR Interval:    QRS Duration: 110 QT Interval:  427 QTC Calculation: 444 R Axis:   -21 Text Interpretation: Sinus rhythm Atrial premature complex Borderline left axis deviation Low voltage, precordial leads RSR' in V1 or V2, probably normal variant Abnormal inferior Q waves Consider anterior infarct No significant change since last tracing Confirmed by Thayer Jew [phone]) on 04/24/2020 5:26:14 AM   Radiology DG Chest 2 View  Result Date: 04/23/2020 CLINICAL DATA:  Chest pain EXAM: CHEST - 2 VIEW COMPARISON:  11/13/2018 FINDINGS: Cardiac shadow is stable. Tortuous thoracic aorta is noted. The lungs are well aerated bilaterally. No focal infiltrate or effusion is seen. No bony abnormality is noted. IMPRESSION: No acute abnormality seen. Electronically Signed   By: Inez Catalina M.D.   On: 04/23/2020 21:41    Procedures Procedures (including critical care time)  Medications Ordered in ED Medications - No data to display  ED Course  I have reviewed the triage vital signs and the nursing notes.  Pertinent labs & imaging results that were available during my care of the patient were reviewed by me and considered in my medical decision making (see chart for details).  Clinical Course as of Apr 25 727  Wed Apr 24, 2020  0726 Spoke with Suanne Marker, cardiology.  Agrees to repeat troponin.  If stable or downtrending, likely outpatient cardiology follow-up.   [CH]    Clinical Course User Index [CH] Haelee Bolen, Barbette Hair, MD   MDM Rules/Calculators/A&P  Patient presents with atrial fibrillation.  Had some chest pain during the episode but that has resolved.  Atrial fibrillation has also resolved.  He took his home dose of diltiazem.  I have reviewed his outside records and EKG which showed  atrial fibrillation with a rate in 130s.  EKG here is sinus rhythm without acute ischemia or arrhythmic changes.  Lab work from triage reviewed and largely reassuring.  Troponin was not sent.  Given pain and history of coronary artery disease, will obtain troponin.  Troponin marginally elevated at 29.  This is in setting of a creatinine of 1.45.  May be related to demand and his atrial fibrillation episode.  Will repeat to to trend.  See discussion with cardiology above.  Patient has remained chest pain-free during my evaluation.  This patients CHA2DS2-VASc Score and unadjusted Ischemic Stroke Rate (% per year) is equal to 3.2 % stroke rate/year from a score of 3  Above score calculated as 1 point each if present [CHF, HTN, DM, Vascular=MI/PAD/Aortic Plaque, Age if 65-74, or Male] Above score calculated as 2 points each if present [Age > 75, or Stroke/TIA/TE]   Final Clinical Impression(s) / ED Diagnoses Final diagnoses:  Atrial fibrillation with RVR ([place])  Stable angina [facility])    Rx / DC Orders ED Discharge Orders         Ordered    Amb referral to AFIB Clinic        04/24/20 0503           Merryl Hacker, MD 04/24/20 0730

## 2020-05-06 DIAGNOSIS — J069 Acute upper respiratory infection, unspecified: Secondary | ICD-10-CM | POA: Diagnosis not present

## 2020-05-09 ENCOUNTER — Other Ambulatory Visit: Payer: Self-pay

## 2020-05-09 ENCOUNTER — Encounter: Payer: Self-pay | Admitting: Cardiovascular Disease

## 2020-05-09 ENCOUNTER — Ambulatory Visit (INDEPENDENT_AMBULATORY_CARE_PROVIDER_SITE_OTHER): Payer: PPO

## 2020-05-09 ENCOUNTER — Ambulatory Visit: Payer: PPO | Admitting: Cardiovascular Disease

## 2020-05-09 VITALS — BP 144/78 | HR 55 | Ht 70.0 in | Wt 199.1 lb

## 2020-05-09 DIAGNOSIS — I480 Paroxysmal atrial fibrillation: Secondary | ICD-10-CM

## 2020-05-09 DIAGNOSIS — I10 Essential (primary) hypertension: Secondary | ICD-10-CM

## 2020-05-09 DIAGNOSIS — I2510 Atherosclerotic heart disease of native coronary artery without angina pectoris: Secondary | ICD-10-CM

## 2020-05-09 DIAGNOSIS — I77810 Thoracic aortic ectasia: Secondary | ICD-10-CM

## 2020-05-09 DIAGNOSIS — E7800 Pure hypercholesterolemia, unspecified: Secondary | ICD-10-CM

## 2020-05-09 HISTORY — DX: Thoracic aortic ectasia: I77.810

## 2020-05-09 LAB — ECHOCARDIOGRAM COMPLETE
AR max vel: 3.5 cm2
AV Area VTI: 3.7 cm2
AV Area mean vel: 3.64 cm2
AV Mean grad: 4 mmHg
AV Peak grad: 7.8 mmHg
Ao pk vel: 1.4 m/s
Area-P 1/2: 3.05 cm2
Calc EF: 54.7 %
Height: 70 in
S' Lateral: 2.05 cm
Single Plane A2C EF: 55.6 %
Single Plane A4C EF: 53.1 %
Weight: 3186 oz

## 2020-05-09 MED ORDER — DILTIAZEM HCL 30 MG PO TABS: ORAL_TABLET | ORAL | 1 refills | Status: DC

## 2020-05-09 NOTE — Progress Notes (Signed)
Cardiology Office Note   Date:  05/09/2020   ID:  Benjamin Brewer, DOB 1930-08-30, MRN [id number]  PCP:  Derinda Late, MD  Cardiologist:   Kathlyn Sacramento, MD   Chief Complaint  Patient presents with  . other    Pt. c/o an A-fib spell with mild chest pain that lasted about 1 hour about two weeks ago; pt was at [facility] ER and was told to follow up. Meds reviewed by the pt. verbally.       History of Present Illness: Benjamin Brewer is a 86 y.o. male who presents for a followup visit regarding coronary artery disease and paroxysmal atrial fibrillation . He is status post angioplasty and drug-eluting stent placement to the LAD and RCA in Oct 03, 2007.  He was diagnosed with atrial fibrillation with rapid ventricular response in 02-Oct-2013 and has been on anticoagulation since then.  Echocardiogram in July 2019 showed EF of 60 to 65%, calcified aortic valve without significant stenosis, mildly dilated left atrium and no significant pulmonary hypertension. The patient's wife died in 10-02-2017 and he had significant grief, anxiety and depression since then. He was diagnosed with cervical spine disease in 2018-10-03.  Treatment with prednisone was associated with worsening atrial fibrillation.  He had recent episode of A. fib with RVR and went to the emergency room.  He took diltiazem 30 mg and subsequently converted to sinus rhythm.  The episode lasted for about 1 hour.  His labs were unremarkable except for mildly elevated troponin at 29 that subsequently decreased to 27.  It was felt to be due to tachycardia.  He had some mild weakness and chest discomfort in the setting of tachycardia but no further episodes since then.  He does not have angina on a regular basis and no further episodes of atrial fibrillation.  Past Medical History:  Diagnosis Date  . A-fib ([place])   . Arthritis    hands  . Benign prostatic hypertrophy    Hx of   . CAD (coronary artery disease)    native vessel. Status post LAD and  RCA angioplasty and drug-eluting stent placement in Oct 03, 2007  . Dental bridge present    implants, no bridge  . Dizziness   . Dyslipidemia   . GERD (gastroesophageal reflux disease)   . History of tachycardia   . Hyperlipidemia   . Hypertension   . Intermediate coronary syndrome (HCC)    angina, unstable  . Numbness and tingling of both lower extremities   . Other dyspnea and respiratory abnormality    on exertion  . Pinched nerve in neck    some discomfort with side to side movement  . Vertigo    approx 1x/month    Past Surgical History:  Procedure Laterality Date  . [place]; x2 (last stent 03-Oct-2007)  . [place]  . CATARACT EXTRACTION W/PHACO Left 07/03/2019   Procedure: CATARACT EXTRACTION PHACO AND INTRAOCULAR LENS PLACEMENT (IOC) LEFT 10.19  01:06.9;  Surgeon: Eulogio Bear, MD;  Location: [place];  Service: Ophthalmology;  Laterality: Left;  . ESOPHAGOGASTRODUODENOSCOPY (EGD) WITH PROPOFOL N/A 06/10/2015   Procedure: ESOPHAGOGASTRODUODENOSCOPY (EGD) WITH PROPOFOL;  Surgeon: Manya Silvas, MD;  Location: [facility] ENDOSCOPY;  Service: Endoscopy;  Laterality: N/A;  . HERNIA REPAIR    . SAVORY DILATION N/A 06/10/2015   Procedure: SAVORY DILATION;  Surgeon: Manya Silvas, MD;  Location: [facility] ENDOSCOPY;  Service: Endoscopy;  Laterality: N/A;  . TONSILLECTOMY       Current Outpatient Medications  Medication Sig Dispense Refill  . ASPIRIN 81 PO Take by mouth daily.    Marland Kitchen atorvastatin (LIPITOR) 40 MG tablet TAKE 1 TABLET BY MOUTH EVERY MORNING 90 tablet 0  . cyanocobalamin 500 MCG tablet Take 2,500 mcg by mouth daily.     Marland Kitchen diltiazem (CARDIZEM) 30 MG tablet TAKE ONE TABLET EVERY 4 HOURS AS NEEDED FOR AFIB HEART RATE >100 45 tablet 1  . ELIQUIS 5 MG TABS tablet TAKE 1 TABLET BY MOUTH TWICE DAILY 60 tablet 6  . ferrous sulfate (FERRO-BOB) 325 (65 FE) MG tablet Take 325  mg by mouth daily.      . finasteride (PROSCAR) 5 MG tablet Take 5 mg by mouth daily.    . furosemide (LASIX) 20 MG tablet Take 1 tablet (20 mg total) by mouth daily as needed for fluid or edema. 30 tablet 0  . losartan (COZAAR) 25 MG tablet TAKE ONE TABLET EVERY DAY 90 tablet 0  . metoprolol succinate (TOPROL-XL) 25 MG 24 hr tablet Take 0.5 tablets (12.5 mg total) by mouth daily. 45 tablet 3  . Multiple Vitamin (MULTIVITAMIN) tablet Take 1 tablet by mouth daily.    . Multiple Vitamins-Minerals (EYE VITAMINS PO) Take 1 tablet by mouth 2 (two) times daily.     . nitroGLYCERIN (NITROSTAT) 0.4 MG SL tablet place 1 tablet under the tongue if needed every 5 minutes fo 25 tablet 3  . pantoprazole (PROTONIX) 40 MG tablet TAKE ONE TABLET EVERY DAY 30 tablet 6   No current facility-administered medications for this visit.    Allergies:   Prednisone and Terazosin    Social History:  The patient  reports that he has never smoked. He has never used smokeless tobacco. He reports current alcohol use of about 1.0 standard drink of alcohol per week. He reports that he does not use drugs.   Family History:  The patient's family history includes Heart attack in his mother; Hypertension in his mother; Stroke in his mother and another family member.    ROS:  Please see the history of present illness.   Otherwise, review of systems are positive for none.   All other systems are reviewed and negative.    PHYSICAL EXAM: VS:  BP (!) 144/78 (BP Location: Left Arm, Patient Position: Sitting, Cuff Size: Normal)   Pulse (!) 55   Ht 5\' 10"  (1.778 m)   Wt 199 lb 2 oz (90.3 kg)   SpO2 98%   BMI 28.57 kg/m  , BMI Body mass index is 28.57 kg/m. GEN: Well nourished, well developed, in no acute distress  HEENT: normal  Neck: no JVD, carotid bruits, or masses Cardiac: RRR; no  rubs, or gallops,no edema .  1 /6 systolic murmur at the aortic area Respiratory:  clear to auscultation bilaterally, normal work of  breathing GI: soft, nontender, nondistended, + BS MS: no deformity or atrophy  Skin: warm and dry, no rash Neuro:  Strength and sensation are intact Psych: euthymic mood, full affect   EKG:  EKG is ordered today. The ekg ordered today demonstrates normal sinus rhythm with a heart rate of 55 bpm.  Poor R wave progression in the anterior leads.  Recent Labs: 01/24/2020: ALT 17 04/23/2020: BUN 20; Creatinine, Ser 1.45; Hemoglobin 11.6; Platelets 253; Potassium 3.8; Sodium 137 04/24/2020: Magnesium 2.3    Lipid Panel    Component Value  Date/Time   CHOL 115 10/08/2012 0554   TRIG 75 10/08/2012 0554   HDL 43 10/08/2012 0554   VLDL 15 10/08/2012 0554   LDLCALC 57 10/08/2012 0554      Wt Readings from Last 3 Encounters:  05/09/20 199 lb 2 oz (90.3 kg)  01/25/20 201 lb 4 oz (91.3 kg)  01/23/20 210 lb (95.3 kg)        ASSESSMENT AND PLAN:  1.  Coronary artery disease involving native coronary arteries without angina:  Continue medical therapy. He is not on antiplatelet medications given that he is on long-term anticoagulation. Mildly elevated troponin recently in the setting of A. fib with RVR likely supply demand ischemia.  The patient has no anginal symptoms.  No need for ischemic cardiac evaluation at the present time.  2. Paroxysmal atrial fibrillation:  He had recent episode of atrial fibrillation with RVR.  His episodes are usually not very frequent and the burden is overall low.  Not able to increase metoprolol due to baseline bradycardia.  Continue to monitor closely for now and if he starts having more frequent episodes of atrial fibrillation, I think he will need to be treated with an antiarrhythmic medication like amiodarone.  I'm going to repeat his echocardiogram.  3. Essential hypertension: Blood pressures well controlled on current medications.  4. Hyperlipidemia: Continue treatment with atorvastatin .   Most recent lipid profile in May showed an LDL of 78 but  usually is LDL below 70.  I made no changes.    Disposition:   FU with me in 3 months  Signed,  Kathlyn Sacramento, MD  05/09/2020 10:48 AM    [place]

## 2020-05-09 NOTE — Patient Instructions (Signed)
Medication Instructions:  Your physician recommends that you continue on your current medications as directed. Please refer to the Current Medication list given to you today.  Diltiazem has been refilled today.  *If you need a refill on your cardiac medications before your next appointment, please call your pharmacy*   Lab Work: None ordered If you have labs (blood work) drawn today and your tests are completely normal, you will receive your results only by: Marland Kitchen MyChart Message (if you have MyChart) OR . A paper copy in the mail If you have any lab test that is abnormal or we need to change your treatment, we will call you to review the results.   Testing/Procedures: Your physician has requested that you have an echocardiogram. Echocardiography is a painless test that uses sound waves to create images of your heart. It provides your doctor with information about the size and shape of your heart and how well your heart's chambers and valves are working. This procedure takes approximately one hour. There are no restrictions for this procedure.     Follow-Up: At [facility], you and your health needs are our priority.  As part of our continuing mission to provide you with exceptional heart care, we have created designated Provider Care Teams.  These Care Teams include your primary Cardiologist (physician) and Advanced Practice Providers (APPs -  Physician Assistants and Nurse Practitioners) who all work together to provide you with the care you need, when you need it.  We recommend signing up for the patient portal called "MyChart".  Sign up information is provided on this After Visit Summary.  MyChart is used to connect with patients for Virtual Visits (Telemedicine).  Patients are able to view lab/test results, encounter notes, upcoming appointments, etc.  Non-urgent messages can be sent to your provider as well.   To learn more about what you can do with MyChart, go to  [email or web address].    Your next appointment:   As planned in Jan 2022   The format for your next appointment:   In Person  Provider:   You may see Kathlyn Sacramento, MD or one of the following Advanced Practice Providers on your designated Care Team:    Murray Hodgkins, NP  Christell Faith, PA-C  Marrianne Mood, PA-C  Cadence Kathlen Mody, [place]    Other Instructions N/A

## 2020-05-15 ENCOUNTER — Other Ambulatory Visit: Payer: Self-pay | Admitting: Physician Assistant

## 2020-05-15 DIAGNOSIS — S61451A Open bite of right hand, initial encounter: Secondary | ICD-10-CM | POA: Diagnosis not present

## 2020-05-15 DIAGNOSIS — W540XXA Bitten by dog, initial encounter: Secondary | ICD-10-CM | POA: Diagnosis not present

## 2020-05-15 DIAGNOSIS — L089 Local infection of the skin and subcutaneous tissue, unspecified: Secondary | ICD-10-CM | POA: Diagnosis not present

## 2020-05-16 DIAGNOSIS — N1831 Chronic kidney disease, stage 3a: Secondary | ICD-10-CM | POA: Diagnosis not present

## 2020-05-16 DIAGNOSIS — R202 Paresthesia of skin: Secondary | ICD-10-CM | POA: Diagnosis not present

## 2020-05-16 DIAGNOSIS — Z125 Encounter for screening for malignant neoplasm of prostate: Secondary | ICD-10-CM | POA: Diagnosis not present

## 2020-05-16 DIAGNOSIS — E7800 Pure hypercholesterolemia, unspecified: Secondary | ICD-10-CM | POA: Diagnosis not present

## 2020-05-16 DIAGNOSIS — Z79899 Other long term (current) drug therapy: Secondary | ICD-10-CM | POA: Diagnosis not present

## 2020-05-28 DIAGNOSIS — L738 Other specified follicular disorders: Secondary | ICD-10-CM | POA: Diagnosis not present

## 2020-06-04 ENCOUNTER — Other Ambulatory Visit: Payer: Self-pay | Admitting: Cardiovascular Disease

## 2020-06-24 ENCOUNTER — Other Ambulatory Visit: Payer: Self-pay | Admitting: Cardiovascular Disease

## 2020-06-24 NOTE — Telephone Encounter (Signed)
Rx request sent to pharmacy.  

## 2020-07-22 ENCOUNTER — Other Ambulatory Visit: Payer: Self-pay | Admitting: Cardiovascular Disease

## 2020-07-23 ENCOUNTER — Encounter: Payer: Self-pay | Admitting: Cardiovascular Disease

## 2020-07-23 ENCOUNTER — Ambulatory Visit: Payer: PPO | Admitting: Cardiovascular Disease

## 2020-07-23 ENCOUNTER — Other Ambulatory Visit: Payer: Self-pay

## 2020-07-23 VITALS — BP 148/78 | HR 67 | Ht 70.0 in | Wt 207.0 lb

## 2020-07-23 DIAGNOSIS — I10 Essential (primary) hypertension: Secondary | ICD-10-CM

## 2020-07-23 DIAGNOSIS — I2510 Atherosclerotic heart disease of native coronary artery without angina pectoris: Secondary | ICD-10-CM

## 2020-07-23 DIAGNOSIS — I480 Paroxysmal atrial fibrillation: Secondary | ICD-10-CM

## 2020-07-23 DIAGNOSIS — E785 Hyperlipidemia, unspecified: Secondary | ICD-10-CM

## 2020-07-23 MED ORDER — METOPROLOL SUCCINATE ER 25 MG PO TB24
25.0000 mg | ORAL_TABLET | Freq: Every day | ORAL | 3 refills | Status: DC
Start: 2020-07-23 — End: 2021-08-06

## 2020-07-23 NOTE — Patient Instructions (Signed)
Medication Instructions:  Your physician has recommended you make the following change in your medication:   INCREASE Metoprolol to 25 mg daily. An Rx has been sent to your pharmacy.  *If you need a refill on your cardiac medications before your next appointment, please call your pharmacy*   Lab Work: None ordered If you have labs (blood work) drawn today and your tests are completely normal, you will receive your results only by: Marland Kitchen MyChart Message (if you have MyChart) OR . A paper copy in the mail If you have any lab test that is abnormal or we need to change your treatment, we will call you to review the results.   Testing/Procedures: None ordered   Follow-Up: At [facility], you and your health needs are our priority.  As part of our continuing mission to provide you with exceptional heart care, we have created designated Provider Care Teams.  These Care Teams include your primary Cardiologist (physician) and Advanced Practice Providers (APPs -  Physician Assistants and Nurse Practitioners) who all work together to provide you with the care you need, when you need it.  We recommend signing up for the patient portal called "MyChart".  Sign up information is provided on this After Visit Summary.  MyChart is used to connect with patients for Virtual Visits (Telemedicine).  Patients are able to view lab/test results, encounter notes, upcoming appointments, etc.  Non-urgent messages can be sent to your provider as well.   To learn more about what you can do with MyChart, go to [email or web address].    Your next appointment:   6 month(s)  The format for your next appointment:   In Person  Provider:   You may see Kathlyn Sacramento, MD or one of the following Advanced Practice Providers on your designated Care Team:    Murray Hodgkins, NP  Christell Faith, PA-C  Marrianne Mood, PA-C  Cadence [place], [place]  Laurann [place], NP    Other Instructions N/A

## 2020-07-23 NOTE — Progress Notes (Signed)
Cardiology Office Note   Date:  07/23/2020   ID:  WHALEN OVERHOLT, DOB [birth year], MRN [id number]  PCP:  Derinda Late, MD  Cardiologist:   Kathlyn Sacramento, MD   Chief Complaint  Patient presents with  . Follow-up    3 Months follow up and c/o heart palpitations and skipped beats. Medications verbally reviewed with patient.      History of Present Illness: Benjamin Brewer is a 87 y.o. male who presents for a followup visit regarding coronary artery disease and paroxysmal atrial fibrillation . He is status post angioplasty and drug-eluting stent placement to the LAD and RCA in 11-04-2007.  He was diagnosed with atrial fibrillation with rapid ventricular response in 2013-11-03 and has been on anticoagulation since then.  Echocardiogram in July 2019 showed EF of 60 to 65%, calcified aortic valve without significant stenosis, mildly dilated left atrium and no significant pulmonary hypertension. The patient's wife died in 11-03-17 and he had significant grief, anxiety and depression since then. He was diagnosed with cervical spine disease in 11/04/18.  Treatment with prednisone was associated with worsening atrial fibrillation.  Most recent episode of atrial fibrillation with RVR was in October.  He converted quickly to sinus rhythm with 1 dose of oral diltiazem.  No recurrent episodes of atrial fibrillation.  He does complain of palpitations but it seems to be more PVCs than atrial fibrillation based on his description.  No chest pain or shortness of breath.    Past Medical History:  Diagnosis Date  . A-fib ([place])   . Arthritis    hands  . Benign prostatic hypertrophy    Hx of   . CAD (coronary artery disease)    native vessel. Status post LAD and RCA angioplasty and drug-eluting stent placement in 04-Nov-2007  . Dental bridge present    implants, no bridge  . Dizziness   . Dyslipidemia   . GERD (gastroesophageal reflux disease)   . History of tachycardia   . Hyperlipidemia   . Hypertension   .  Intermediate coronary syndrome (HCC)    angina, unstable  . Numbness and tingling of both lower extremities   . Other dyspnea and respiratory abnormality    on exertion  . Pinched nerve in neck    some discomfort with side to side movement  . Vertigo    approx 1x/month    Past Surgical History:  Procedure Laterality Date  . [place]; x2 (last stent 04-Nov-2007)  . [place]  . CATARACT EXTRACTION W/PHACO Left 07/03/2019   Procedure: CATARACT EXTRACTION PHACO AND INTRAOCULAR LENS PLACEMENT ([facility]) LEFT 10.19  01:06.9;  Surgeon: Eulogio Bear, MD;  Location: [facility];  Service: Ophthalmology;  Laterality: Left;  . ESOPHAGOGASTRODUODENOSCOPY (EGD) WITH PROPOFOL N/A 06/10/2015   Procedure: ESOPHAGOGASTRODUODENOSCOPY (EGD) WITH PROPOFOL;  Surgeon: Manya Silvas, MD;  Location: [facility] ENDOSCOPY;  Service: Endoscopy;  Laterality: N/A;  . HERNIA REPAIR    . SAVORY DILATION N/A 06/10/2015   Procedure: SAVORY DILATION;  Surgeon: Manya Silvas, MD;  Location: [facility] ENDOSCOPY;  Service: Endoscopy;  Laterality: N/A;  . TONSILLECTOMY       Current Outpatient Medications  Medication Sig Dispense Refill  . ASPIRIN 81 PO Take by mouth daily.    Marland Kitchen atorvastatin (LIPITOR) 40 MG tablet TAKE 1 TABLET BY MOUTH EVERY MORNING 90 tablet 0  . cyanocobalamin  500 MCG tablet Take 2,500 mcg by mouth daily.     Marland Kitchen diltiazem (CARDIZEM) 30 MG tablet TAKE ONE TABLET EVERY FOUR HOURS AS NEEDED FOR AFIB HEART RATE >100 45 tablet 0  . ELIQUIS 5 MG TABS tablet TAKE 1 TABLET BY MOUTH TWICE DAILY 60 tablet 6  . ferrous sulfate 325 (65 FE) MG tablet Take 325 mg by mouth daily.    . finasteride (PROSCAR) 5 MG tablet Take 5 mg by mouth daily.    . furosemide (LASIX) 20 MG tablet Take 1 tablet (20 mg total) by mouth daily as needed for fluid or edema. 30 tablet 0  . losartan (COZAAR) 25 MG tablet TAKE ONE TABLET  EVERY DAY 90 tablet 1  . metoprolol succinate (TOPROL-XL) 25 MG 24 hr tablet Take 0.5 tablets (12.5 mg total) by mouth daily. 45 tablet 1  . Multiple Vitamin (MULTIVITAMIN) tablet Take 1 tablet by mouth daily.    . nitroGLYCERIN (NITROSTAT) 0.4 MG SL tablet place 1 tablet under the tongue if needed every 5 minutes fo 25 tablet 3  . pantoprazole (PROTONIX) 40 MG tablet TAKE ONE TABLET EVERY DAY 30 tablet 6  . traZODone (DESYREL) 50 MG tablet Take 50 mg by mouth at bedtime as needed.     No current facility-administered medications for this visit.    Allergies:   Prednisone and Terazosin    Social History:  The patient  reports that he has never smoked. He has never used smokeless tobacco. He reports current alcohol use of about 1.0 standard drink of alcohol per week. He reports that he does not use drugs.   Family History:  The patient's family history includes Heart attack in his mother; Hypertension in his mother; Stroke in his mother and another family member.    ROS:  Please see the history of present illness.   Otherwise, review of systems are positive for none.   All other systems are reviewed and negative.    PHYSICAL EXAM: VS:  BP (!) 148/78 (BP Location: Left Arm, Patient Position: Sitting, Cuff Size: Normal)   Pulse 67   Ht 5\' 10"  (1.778 m)   Wt 207 lb (93.9 kg)   SpO2 98%   BMI 29.70 kg/m  , BMI Body mass index is 29.7 kg/m. GEN: Well nourished, well developed, in no acute distress  HEENT: normal  Neck: no JVD, carotid bruits, or masses Cardiac: RRR; no  rubs, or gallops,no edema .  1 /6 systolic murmur at the aortic area Respiratory:  clear to auscultation bilaterally, normal work of breathing GI: soft, nontender, nondistended, + BS MS: no deformity or atrophy  Skin: warm and dry, no rash Neuro:  Strength and sensation are intact Psych: euthymic mood, full affect   EKG:  EKG is ordered today. The ekg ordered today demonstrates normal sinus rhythm with a heart  rate of 67 bpm.  1 PVC.  Recent Labs: 01/24/2020: ALT 17 04/23/2020: BUN 20; Creatinine, Ser 1.45; Hemoglobin 11.6; Platelets 253; Potassium 3.8; Sodium 137 04/24/2020: Magnesium 2.3    Lipid Panel    Component Value Date/Time   CHOL 115 10/08/2012 0554   TRIG 75 10/08/2012 0554   HDL 43 10/08/2012 0554   VLDL 15 10/08/2012 0554   LDLCALC 57 10/08/2012 0554      Wt Readings from Last 3 Encounters:  07/23/20 207 lb (93.9 kg)  05/09/20 199 lb 2 oz (90.3 kg)  01/25/20 201 lb 4 oz (91.3 kg)  ASSESSMENT AND PLAN:  1.  Coronary artery disease involving native coronary arteries without angina:  Continue medical therapy. He is not on antiplatelet medications given that he is on long-term anticoagulation.  2. Paroxysmal atrial fibrillation: No episodes since October.  Most recent labs showed creatinine of 1.4.  Thus, continue current dose of Eliquis.   If episodes of atrial fibrillation become more frequent, recommend treatment with amiodarone.  3. Essential hypertension: Blood pressure is reasonably controlled on current medications.  4. Hyperlipidemia: Continue treatment with atorvastatin .   Most recent lipid profile showed an LDL of 60.    5.  PVCs: Palpitations seem to be due to PVCs.  I elected to increase Toprol to 25 mg once daily.  I doubt we will be able to increase any further.  Baseline bradycardia.    Disposition:   FU with me in 6 months  Signed,  Kathlyn Sacramento, MD  07/23/2020 2:30 PM    [place]

## 2020-08-02 ENCOUNTER — Other Ambulatory Visit: Payer: Self-pay | Admitting: Cardiovascular Disease

## 2020-08-16 ENCOUNTER — Other Ambulatory Visit: Payer: Self-pay | Admitting: Cardiovascular Disease

## 2020-08-29 DIAGNOSIS — H353133 Nonexudative age-related macular degeneration, bilateral, advanced atrophic without subfoveal involvement: Secondary | ICD-10-CM | POA: Diagnosis not present

## 2020-09-03 DIAGNOSIS — Z0000 Encounter for general adult medical examination without abnormal findings: Secondary | ICD-10-CM | POA: Diagnosis not present

## 2020-09-03 DIAGNOSIS — Z1331 Encounter for screening for depression: Secondary | ICD-10-CM | POA: Diagnosis not present

## 2020-10-23 ENCOUNTER — Other Ambulatory Visit: Payer: Self-pay | Admitting: Cardiovascular Disease

## 2020-11-10 DIAGNOSIS — Z8616 Personal history of COVID-19: Secondary | ICD-10-CM

## 2020-11-10 HISTORY — DX: Personal history of COVID-19: Z86.16

## 2020-11-30 DIAGNOSIS — J019 Acute sinusitis, unspecified: Secondary | ICD-10-CM | POA: Diagnosis not present

## 2020-11-30 DIAGNOSIS — U071 COVID-19: Secondary | ICD-10-CM | POA: Diagnosis not present

## 2020-11-30 DIAGNOSIS — Z20828 Contact with and (suspected) exposure to other viral communicable diseases: Secondary | ICD-10-CM | POA: Diagnosis not present

## 2020-12-20 DIAGNOSIS — R197 Diarrhea, unspecified: Secondary | ICD-10-CM | POA: Diagnosis not present

## 2020-12-20 DIAGNOSIS — N400 Enlarged prostate without lower urinary tract symptoms: Secondary | ICD-10-CM | POA: Diagnosis not present

## 2020-12-20 DIAGNOSIS — R39198 Other difficulties with micturition: Secondary | ICD-10-CM | POA: Diagnosis not present

## 2020-12-20 DIAGNOSIS — R1084 Generalized abdominal pain: Secondary | ICD-10-CM | POA: Diagnosis not present

## 2020-12-23 DIAGNOSIS — R197 Diarrhea, unspecified: Secondary | ICD-10-CM | POA: Diagnosis not present

## 2020-12-30 DIAGNOSIS — R197 Diarrhea, unspecified: Secondary | ICD-10-CM | POA: Diagnosis not present

## 2020-12-30 DIAGNOSIS — R109 Unspecified abdominal pain: Secondary | ICD-10-CM | POA: Diagnosis not present

## 2020-12-30 DIAGNOSIS — R1084 Generalized abdominal pain: Secondary | ICD-10-CM | POA: Diagnosis not present

## 2020-12-30 DIAGNOSIS — R8271 Bacteriuria: Secondary | ICD-10-CM | POA: Diagnosis not present

## 2021-01-01 DIAGNOSIS — E785 Hyperlipidemia, unspecified: Secondary | ICD-10-CM | POA: Diagnosis not present

## 2021-01-01 DIAGNOSIS — Z7982 Long term (current) use of aspirin: Secondary | ICD-10-CM | POA: Diagnosis not present

## 2021-01-01 DIAGNOSIS — K644 Residual hemorrhoidal skin tags: Secondary | ICD-10-CM | POA: Diagnosis not present

## 2021-01-01 DIAGNOSIS — Z7901 Long term (current) use of anticoagulants: Secondary | ICD-10-CM | POA: Diagnosis not present

## 2021-01-01 DIAGNOSIS — D509 Iron deficiency anemia, unspecified: Secondary | ICD-10-CM | POA: Diagnosis not present

## 2021-01-01 DIAGNOSIS — I4891 Unspecified atrial fibrillation: Secondary | ICD-10-CM | POA: Diagnosis not present

## 2021-01-01 DIAGNOSIS — N400 Enlarged prostate without lower urinary tract symptoms: Secondary | ICD-10-CM | POA: Diagnosis not present

## 2021-01-01 DIAGNOSIS — K5730 Diverticulosis of large intestine without perforation or abscess without bleeding: Secondary | ICD-10-CM | POA: Diagnosis not present

## 2021-01-01 DIAGNOSIS — K922 Gastrointestinal hemorrhage, unspecified: Secondary | ICD-10-CM | POA: Diagnosis not present

## 2021-01-01 DIAGNOSIS — I10 Essential (primary) hypertension: Secondary | ICD-10-CM | POA: Diagnosis not present

## 2021-01-01 DIAGNOSIS — M6281 Muscle weakness (generalized): Secondary | ICD-10-CM | POA: Diagnosis not present

## 2021-01-01 DIAGNOSIS — Z955 Presence of coronary angioplasty implant and graft: Secondary | ICD-10-CM | POA: Diagnosis not present

## 2021-01-01 DIAGNOSIS — R531 Weakness: Secondary | ICD-10-CM | POA: Diagnosis not present

## 2021-01-01 DIAGNOSIS — R197 Diarrhea, unspecified: Secondary | ICD-10-CM | POA: Diagnosis not present

## 2021-01-01 DIAGNOSIS — Z8616 Personal history of COVID-19: Secondary | ICD-10-CM | POA: Diagnosis not present

## 2021-01-01 DIAGNOSIS — K449 Diaphragmatic hernia without obstruction or gangrene: Secondary | ICD-10-CM | POA: Diagnosis not present

## 2021-01-01 DIAGNOSIS — R42 Dizziness and giddiness: Secondary | ICD-10-CM | POA: Diagnosis not present

## 2021-01-01 DIAGNOSIS — Z79899 Other long term (current) drug therapy: Secondary | ICD-10-CM | POA: Diagnosis not present

## 2021-01-01 DIAGNOSIS — Z20822 Contact with and (suspected) exposure to covid-19: Secondary | ICD-10-CM | POA: Diagnosis not present

## 2021-01-01 DIAGNOSIS — R1084 Generalized abdominal pain: Secondary | ICD-10-CM | POA: Diagnosis not present

## 2021-01-01 DIAGNOSIS — I2510 Atherosclerotic heart disease of native coronary artery without angina pectoris: Secondary | ICD-10-CM | POA: Diagnosis not present

## 2021-01-01 DIAGNOSIS — K921 Melena: Secondary | ICD-10-CM | POA: Diagnosis not present

## 2021-01-02 DIAGNOSIS — K921 Melena: Secondary | ICD-10-CM | POA: Insufficient documentation

## 2021-01-02 DIAGNOSIS — R197 Diarrhea, unspecified: Secondary | ICD-10-CM | POA: Diagnosis not present

## 2021-01-02 DIAGNOSIS — Z8616 Personal history of COVID-19: Secondary | ICD-10-CM | POA: Diagnosis not present

## 2021-01-02 DIAGNOSIS — K5730 Diverticulosis of large intestine without perforation or abscess without bleeding: Secondary | ICD-10-CM | POA: Diagnosis not present

## 2021-01-02 DIAGNOSIS — K644 Residual hemorrhoidal skin tags: Secondary | ICD-10-CM | POA: Diagnosis not present

## 2021-01-02 DIAGNOSIS — R531 Weakness: Secondary | ICD-10-CM | POA: Diagnosis not present

## 2021-01-17 ENCOUNTER — Telehealth: Payer: Self-pay | Admitting: Cardiovascular Disease

## 2021-01-17 NOTE — Telephone Encounter (Signed)
Spoke with the patient. Patient sts that he tested positive for COVID several weeks ago. He began to feel better but then developed diarrhea for several days. He was then seen at urgent care and they started him on an antibiotic. He then developed constipation. He returned to urgent care. They took a stool sample from him and after it resulted they instructed him to go to the ED for evaluation.  Patient sts that he did go to [facility] ED @ [place]. The ED physician felt that his dark tarry stool was related to the antibiotic and he was instructed to stop.  He denies any signs of bleeding or recurrent dark stools. He sts that he is still quizzy on his stomach. He reports lower bp readings, dizziness with position change. He sts that his HR has been a little higher no readings provided. He also complains of intermittent blurred vision over several weeks.  Adv the patient that the readings he provided were ok. Adv him that he may be a little dehydrated. Encourage the pt to increase his fluid intake, and to try eating small meals and snacks through the day. Adv the patient to change positions slowly. He is to continue to monitor his BP and HR and bring the readings with him to his appt. Adv the patient that he should contact his pcp to f/u his GI issues. Pt sts that he did contact his pcp and was told that they could not see him any sooner the 1 month. Pt adv to keep his appt with Dr. Fletcher Anon on 01/21/21, adv him to seek emergent care in the interim for worsening symptoms.  Patient verbalized and was very appreciation for the call.

## 2021-01-17 NOTE — Telephone Encounter (Signed)
Pt c/o BP issue: STAT if pt c/o blurred vision, one-sided weakness or slurred speech  1. What are your last 5 BP readings? 115/60's 132/70   2. Are you having any other symptoms (ex. Dizziness, headache, blurred vision, passed out)?  Lightheadedness Dizziness intermittent since covid illness blurry vision    3. What is your BP issue? Worried about issues since covid illness and wants asap visit.  Scheduled 7/12 arida but wants sooner.

## 2021-01-21 ENCOUNTER — Other Ambulatory Visit: Payer: Self-pay

## 2021-01-21 ENCOUNTER — Ambulatory Visit (INDEPENDENT_AMBULATORY_CARE_PROVIDER_SITE_OTHER): Payer: PPO | Admitting: Cardiovascular Disease

## 2021-01-21 ENCOUNTER — Ambulatory Visit (INDEPENDENT_AMBULATORY_CARE_PROVIDER_SITE_OTHER): Payer: PPO

## 2021-01-21 ENCOUNTER — Encounter: Payer: Self-pay | Admitting: Cardiovascular Disease

## 2021-01-21 VITALS — BP 140/60 | HR 65 | Ht 70.0 in | Wt 202.5 lb

## 2021-01-21 DIAGNOSIS — E785 Hyperlipidemia, unspecified: Secondary | ICD-10-CM | POA: Diagnosis not present

## 2021-01-21 DIAGNOSIS — I10 Essential (primary) hypertension: Secondary | ICD-10-CM

## 2021-01-21 DIAGNOSIS — I2510 Atherosclerotic heart disease of native coronary artery without angina pectoris: Secondary | ICD-10-CM

## 2021-01-21 DIAGNOSIS — I493 Ventricular premature depolarization: Secondary | ICD-10-CM | POA: Diagnosis not present

## 2021-01-21 DIAGNOSIS — I480 Paroxysmal atrial fibrillation: Secondary | ICD-10-CM

## 2021-01-21 DIAGNOSIS — R002 Palpitations: Secondary | ICD-10-CM | POA: Diagnosis not present

## 2021-01-21 NOTE — Patient Instructions (Addendum)
Medication Instructions:  Your physician has recommended you make the following change in your medication:   STOP Aspirin.  Continue your other medications as prescribed.    *If you need a refill on your cardiac medications before your next appointment, please call your pharmacy*   Lab Work: None ordered  If you have labs (blood work) drawn today and your tests are completely normal, you will receive your results only by: [place] (if you have MyChart) OR A paper copy in the mail If you have any lab test that is abnormal or we need to change your treatment, we will call you to review the results.   Testing/Procedures: Your physician has recommended that you wear a Zio XT monitor. (To be worn for 14 days)  This monitor is a medical device that records the heart's electrical activity. Doctors most often use these monitors to diagnose arrhythmias. Arrhythmias are problems with the speed or rhythm of the heartbeat. The monitor is a small device applied to your chest. You can wear one while you do your normal daily activities. While wearing this monitor if you have any symptoms to push the button and record what you felt. Once you have worn this monitor for the period of time provider prescribed (Usually 14 days), you will return the monitor device in the postage paid box. Once it is returned they will download the data collected and provide [place] with a report which the provider will then review and we will call you with those results. Important tips:  Avoid showering during the first 24 hours of wearing the monitor. Avoid excessive sweating to help maximize wear time. Do not submerge the device, no hot tubs, and no swimming pools. Keep any lotions or oils away from the patch. After 24 hours you may shower with the patch on. Take brief showers with your back facing the shower head.  Do not remove patch once it has been placed because that will interrupt data and decrease adhesive wear  time. Push the button when you have any symptoms and write down what you were feeling. Once you have completed wearing your monitor, remove and place into box which has postage paid and place in your outgoing mailbox.  If for some reason you have misplaced your box then call our office and we can provide another box and/or mail it off for you.      Follow-Up: At [facility], you and your health needs are our priority.  As part of our continuing mission to provide you with exceptional heart care, we have created designated Provider Care Teams.  These Care Teams include your primary Cardiologist (physician) and Advanced Practice Providers (APPs -  Physician Assistants and Nurse Practitioners) who all work together to provide you with the care you need, when you need it.  We recommend signing up for the patient portal called "MyChart".  Sign up information is provided on this After Visit Summary.  MyChart is used to connect with patients for Virtual Visits (Telemedicine).  Patients are able to view lab/test results, encounter notes, upcoming appointments, etc.  Non-urgent messages can be sent to your provider as well.   To learn more about what you can do with MyChart, go to [email or web address].    Your next appointment:   4 week(s)  The format for your next appointment:   In Person  Provider:   You may see Kathlyn Sacramento, MD or one of the following Advanced Practice Providers on your designated Care Team:  Murray Hodgkins, NP Christell Faith, PA-C Marrianne Mood, PA-C Cadence Kathlen Mody, [place]   Other Instructions N/A

## 2021-01-21 NOTE — Progress Notes (Signed)
Cardiology Office Note   Date:  01/21/2021   ID:  Benjamin Brewer, DOB Dec 29, 1930, MRN [id number]  PCP:  Benjamin Late, MD  Cardiologist:   Benjamin Sacramento, MD   Chief Complaint  Patient presents with   Other    BP Issues c/o sob and weakness. Meds reviewed verbally with pt.      History of Present Illness: Benjamin Brewer is a 87 y.o. male who presents for a followup visit regarding coronary artery disease and paroxysmal atrial fibrillation . He is status post angioplasty and drug-eluting stent placement to the LAD and RCA in 2007/10/28.  He was diagnosed with atrial fibrillation with rapid ventricular response in Oct 27, 2013 and has been on anticoagulation since then.  Echocardiogram in July 2019 showed EF of 60 to 65%, calcified aortic valve without significant stenosis, mildly dilated left atrium and no significant pulmonary hypertension. The patient's wife died in 10-27-2017 and he had significant grief, anxiety and depression since then. He was diagnosed with cervical spine disease in 10/28/2018.  Treatment with prednisone was associated with worsening atrial fibrillation.  He was diagnosed with COVID-19 infection in May and had some diarrhea.  He was placed on antibiotics and had worsening diarrhea.  He went to the emergency room at [facility] . he was guaiac positive with mild anemia with a hemoglobin of 12.2.  Antibiotics were stopped.  Renal function was stable with no evidence of dehydration.  He has not been feeling well since his COVID diagnosis with episodes of dizziness and sinking feeling associated with palpitations.  Also his blood pressure has been running lower than his baseline.  No chest pain or worsening dyspnea.   Past Medical History:  Diagnosis Date   A-fib [facility])    Arthritis    hands   Benign prostatic hypertrophy    Hx of    CAD (coronary artery disease)    native vessel. Status post LAD and RCA angioplasty and drug-eluting stent placement in Oct 28, 2007   Dental bridge present     implants, no bridge   Dizziness    Dyslipidemia    GERD (gastroesophageal reflux disease)    History of tachycardia    Hyperlipidemia    Hypertension    Intermediate coronary syndrome (HCC)    angina, unstable   Numbness and tingling of both lower extremities    Other dyspnea and respiratory abnormality    on exertion   Pinched nerve in neck    some discomfort with side to side movement   Vertigo    approx 1x/month    Past Surgical History:  Procedure Laterality Date   [place]; x2 (last stent 10-28-2007)   [place] W/PHACO Left 07/03/2019   Procedure: CATARACT EXTRACTION PHACO AND INTRAOCULAR LENS PLACEMENT ([place]) LEFT 10.19  01:06.9;  Surgeon: Eulogio Bear, MD;  Location: [place];  Service: Ophthalmology;  Laterality: Left;   ESOPHAGOGASTRODUODENOSCOPY (EGD) WITH PROPOFOL N/A 06/10/2015   Procedure: ESOPHAGOGASTRODUODENOSCOPY (EGD) WITH PROPOFOL;  Surgeon: Manya Silvas, MD;  Location: [facility] ENDOSCOPY;  Service: Endoscopy;  Laterality: N/A;   HERNIA REPAIR     SAVORY DILATION N/A 06/10/2015   Procedure: SAVORY DILATION;  Surgeon: Manya Silvas, MD;  Location: [facility] ENDOSCOPY;  Service: Endoscopy;  Laterality: N/A;   TONSILLECTOMY       Current Outpatient Medications  Medication Sig  Dispense Refill   ASPIRIN 81 PO Take by mouth daily.     atorvastatin (LIPITOR) 40 MG tablet TAKE 1 TABLET BY MOUTH EVERY MORNING 90 tablet 0   cyanocobalamin 500 MCG tablet Take 2,500 mcg by mouth daily.      diltiazem (CARDIZEM) 30 MG tablet TAKE ONE TABLET EVERY FOUR HOURS AS NEEDED FOR AFIB HEART RATE >100 45 tablet 0   ELIQUIS 5 MG TABS tablet TAKE 1 TABLET BY MOUTH TWICE DAILY 60 tablet 6   ferrous sulfate 325 (65 FE) MG tablet Take 325 mg by mouth daily.     finasteride (PROSCAR) 5 MG tablet Take 5 mg by mouth daily.     furosemide (LASIX) 20 MG tablet  Take 1 tablet (20 mg total) by mouth daily as needed for fluid or edema. 30 tablet 0   losartan (COZAAR) 25 MG tablet TAKE 1 TABLET BY MOUTH DAILY 90 tablet 1   metoprolol succinate (TOPROL-XL) 25 MG 24 hr tablet Take 1 tablet (25 mg total) by mouth daily. 90 tablet 3   Multiple Vitamin (MULTIVITAMIN) tablet Take 1 tablet by mouth daily.     nitroGLYCERIN (NITROSTAT) 0.4 MG SL tablet place 1 tablet under the tongue if needed every 5 minutes fo 25 tablet 3   pantoprazole (PROTONIX) 40 MG tablet TAKE ONE TABLET EVERY DAY 30 tablet 6   traZODone (DESYREL) 50 MG tablet Take 50 mg by mouth at bedtime as needed.     No current facility-administered medications for this visit.    Allergies:   Prednisone and Terazosin    Social History:  The patient  reports that he has never smoked. He has never used smokeless tobacco. He reports current alcohol use of about 1.0 standard drink of alcohol per week. He reports that he does not use drugs.   Family History:  The patient's family history includes Heart attack in his mother; Hypertension in his mother; Stroke in his mother and another family member.    ROS:  Please see the history of present illness.   Otherwise, review of systems are positive for none.   All other systems are reviewed and negative.    PHYSICAL EXAM: VS:  BP 140/60 (BP Location: Left Arm, Patient Position: Sitting, Cuff Size: Normal)   Pulse 65   Ht 5\' 10"  (1.778 m)   Wt 202 lb 8 oz (91.9 kg)   SpO2 98%   BMI 29.06 kg/m  , BMI Body mass index is 29.06 kg/m. GEN: Well nourished, well developed, in no acute distress  HEENT: normal  Neck: no JVD, carotid bruits, or masses Cardiac: RRR; no  rubs, or gallops,no edema .  1 /6 systolic murmur at the aortic area Respiratory:  clear to auscultation bilaterally, normal work of breathing GI: soft, nontender, nondistended, + BS MS: no deformity or atrophy  Skin: warm and dry, no rash Neuro:  Strength and sensation are intact Psych:  euthymic mood, full affect   EKG:  EKG is ordered today. The ekg ordered today demonstrates sinus rhythm with PACs and possible old inferior infarct.  Recent Labs: 01/24/2020: ALT 17 04/23/2020: BUN 20; Creatinine, Ser 1.45; Hemoglobin 11.6; Platelets 253; Potassium 3.8; Sodium 137 04/24/2020: Magnesium 2.3    Lipid Panel    Component Value Date/Time   CHOL 115 10/08/2012 0554   TRIG 75 10/08/2012 0554   HDL 43 10/08/2012 0554   VLDL 15 10/08/2012 0554   LDLCALC 57 10/08/2012 0554      Wt Readings from  Last 3 Encounters:  01/21/21 202 lb 8 oz (91.9 kg)  07/23/20 207 lb (93.9 kg)  05/09/20 199 lb 2 oz (90.3 kg)        ASSESSMENT AND PLAN:  1.  Coronary artery disease involving native coronary arteries without angina:  Continue medical therapy.  I instructed him to discontinue aspirin given that he is on long-term anticoagulation with Eliquis.  2. Paroxysmal atrial fibrillation: He reports intermittent episodes of palpitations with presyncope.  I requested a 2-week ZIO monitor to see if his symptoms correlate with arrhythmia.  3. Essential hypertension: Blood pressure is lower than his baseline but still within the normal range.  I made no changes in his medications.  4. Hyperlipidemia: Continue treatment with atorvastatin .   Most recent lipid profile showed an LDL of 60.       Disposition:   FU after ZIO monitor  Signed,  Benjamin Sacramento, MD  01/21/2021 4:06 PM    [facility]

## 2021-01-23 ENCOUNTER — Other Ambulatory Visit: Payer: Self-pay | Admitting: Cardiovascular Disease

## 2021-01-25 DIAGNOSIS — I4891 Unspecified atrial fibrillation: Secondary | ICD-10-CM | POA: Diagnosis not present

## 2021-01-25 DIAGNOSIS — I10 Essential (primary) hypertension: Secondary | ICD-10-CM | POA: Diagnosis not present

## 2021-01-25 DIAGNOSIS — R110 Nausea: Secondary | ICD-10-CM | POA: Diagnosis not present

## 2021-01-25 DIAGNOSIS — R5383 Other fatigue: Secondary | ICD-10-CM | POA: Diagnosis not present

## 2021-01-25 DIAGNOSIS — E785 Hyperlipidemia, unspecified: Secondary | ICD-10-CM | POA: Diagnosis not present

## 2021-01-25 DIAGNOSIS — K30 Functional dyspepsia: Secondary | ICD-10-CM | POA: Diagnosis not present

## 2021-01-25 DIAGNOSIS — N400 Enlarged prostate without lower urinary tract symptoms: Secondary | ICD-10-CM | POA: Diagnosis not present

## 2021-01-25 DIAGNOSIS — I2510 Atherosclerotic heart disease of native coronary artery without angina pectoris: Secondary | ICD-10-CM | POA: Diagnosis not present

## 2021-01-25 DIAGNOSIS — R197 Diarrhea, unspecified: Secondary | ICD-10-CM | POA: Diagnosis not present

## 2021-01-28 DIAGNOSIS — R197 Diarrhea, unspecified: Secondary | ICD-10-CM | POA: Diagnosis not present

## 2021-01-28 DIAGNOSIS — D72829 Elevated white blood cell count, unspecified: Secondary | ICD-10-CM | POA: Diagnosis not present

## 2021-02-04 DIAGNOSIS — R002 Palpitations: Secondary | ICD-10-CM

## 2021-02-04 DIAGNOSIS — I480 Paroxysmal atrial fibrillation: Secondary | ICD-10-CM

## 2021-02-10 DIAGNOSIS — H353211 Exudative age-related macular degeneration, right eye, with active choroidal neovascularization: Secondary | ICD-10-CM | POA: Diagnosis not present

## 2021-02-10 DIAGNOSIS — H35033 Hypertensive retinopathy, bilateral: Secondary | ICD-10-CM | POA: Diagnosis not present

## 2021-02-11 DIAGNOSIS — I480 Paroxysmal atrial fibrillation: Secondary | ICD-10-CM | POA: Diagnosis not present

## 2021-02-11 DIAGNOSIS — R002 Palpitations: Secondary | ICD-10-CM | POA: Diagnosis not present

## 2021-02-12 DIAGNOSIS — Z961 Presence of intraocular lens: Secondary | ICD-10-CM | POA: Diagnosis not present

## 2021-02-12 DIAGNOSIS — H43813 Vitreous degeneration, bilateral: Secondary | ICD-10-CM | POA: Diagnosis not present

## 2021-02-12 DIAGNOSIS — H35373 Puckering of macula, bilateral: Secondary | ICD-10-CM | POA: Diagnosis not present

## 2021-02-12 DIAGNOSIS — I4729 Other ventricular tachycardia: Secondary | ICD-10-CM

## 2021-02-12 DIAGNOSIS — H353134 Nonexudative age-related macular degeneration, bilateral, advanced atrophic with subfoveal involvement: Secondary | ICD-10-CM | POA: Diagnosis not present

## 2021-02-12 HISTORY — DX: Other ventricular tachycardia: I47.29

## 2021-03-04 DIAGNOSIS — Z79899 Other long term (current) drug therapy: Secondary | ICD-10-CM | POA: Diagnosis not present

## 2021-03-04 DIAGNOSIS — E7800 Pure hypercholesterolemia, unspecified: Secondary | ICD-10-CM | POA: Diagnosis not present

## 2021-03-04 DIAGNOSIS — I208 Other forms of angina pectoris: Secondary | ICD-10-CM | POA: Diagnosis not present

## 2021-03-04 DIAGNOSIS — I480 Paroxysmal atrial fibrillation: Secondary | ICD-10-CM | POA: Diagnosis not present

## 2021-03-04 DIAGNOSIS — I2510 Atherosclerotic heart disease of native coronary artery without angina pectoris: Secondary | ICD-10-CM | POA: Diagnosis not present

## 2021-03-04 DIAGNOSIS — N1831 Chronic kidney disease, stage 3a: Secondary | ICD-10-CM | POA: Diagnosis not present

## 2021-03-04 DIAGNOSIS — I10 Essential (primary) hypertension: Secondary | ICD-10-CM | POA: Diagnosis not present

## 2021-03-06 ENCOUNTER — Other Ambulatory Visit: Payer: Self-pay

## 2021-03-06 ENCOUNTER — Encounter: Payer: Self-pay | Admitting: Cardiovascular Disease

## 2021-03-06 ENCOUNTER — Ambulatory Visit: Payer: PPO | Admitting: Cardiovascular Disease

## 2021-03-06 VITALS — BP 140/60 | HR 57 | Ht 70.0 in | Wt 200.1 lb

## 2021-03-06 DIAGNOSIS — I2510 Atherosclerotic heart disease of native coronary artery without angina pectoris: Secondary | ICD-10-CM

## 2021-03-06 DIAGNOSIS — E785 Hyperlipidemia, unspecified: Secondary | ICD-10-CM

## 2021-03-06 DIAGNOSIS — I10 Essential (primary) hypertension: Secondary | ICD-10-CM | POA: Diagnosis not present

## 2021-03-06 DIAGNOSIS — I480 Paroxysmal atrial fibrillation: Secondary | ICD-10-CM

## 2021-03-06 NOTE — Patient Instructions (Signed)

## 2021-03-06 NOTE — Progress Notes (Signed)
Cardiology Office Note   Date:  03/06/2021   ID:  Benjamin Brewer Oct 31, 1930, MRN [id number]  PCP:  Derinda Late, MD  Cardiologist:   Kathlyn Sacramento, MD   Chief Complaint  Patient presents with   Other    4 wk f/u pt would like to discuss zio results no complaints today. Meds reviewed verbally with pt.      History of Present Illness: Benjamin Brewer is a 87 y.o. male who presents for a followup visit regarding coronary artery disease and paroxysmal atrial fibrillation . He is status post angioplasty and drug-eluting stent placement to the LAD and RCA in 04-Oct-2007.  He was diagnosed with atrial fibrillation with rapid ventricular response in 10/03/2013 and has been on anticoagulation since then.  Echocardiogram in July 2019 showed EF of 60 to 65%, calcified aortic valve without significant stenosis, mildly dilated left atrium and no significant pulmonary hypertension. The patient's wife died in 10/03/2017 and he had significant grief, anxiety and depression since then. He was diagnosed with cervical spine disease in October 04, 2018.  Treatment with prednisone was associated with worsening atrial fibrillation.  He was diagnosed with COVID-19 infection in May and had severe intermittent diarrhea since then.  In that setting, he had worsening palpitations with episodes of dizziness with a sense of pause in his heart.  A 14-day ZIO monitor was done which showed sinus rhythm with an average heart rate of 70 bpm.  There was 1 run of ventricular tachycardia that lasted 17 beats and he had 88 runs of supraventricular tachycardia the longest lasted 15 seconds.  He had one 4.1-second pause.  Since that time, he reports resolution of diarrhea and with that his palpitations and dizziness has improved significantly.  He denies chest pain or worsening dyspnea.   Past Medical History:  Diagnosis Date   A-fib [facility])    Arthritis    hands   Benign prostatic hypertrophy    Hx of    CAD (coronary artery disease)     native vessel. Status post LAD and RCA angioplasty and drug-eluting stent placement in 10-04-2007   Dental bridge present    implants, no bridge   Dizziness    Dyslipidemia    GERD (gastroesophageal reflux disease)    History of tachycardia    Hyperlipidemia    Hypertension    Intermediate coronary syndrome (HCC)    angina, unstable   Numbness and tingling of both lower extremities    Other dyspnea and respiratory abnormality    on exertion   Pinched nerve in neck    some discomfort with side to side movement   Vertigo    approx 1x/month    Past Surgical History:  Procedure Laterality Date   [place]; x2 (last stent October 04, 2007)   [place] W/PHACO Left 07/03/2019   Procedure: CATARACT EXTRACTION PHACO AND INTRAOCULAR LENS PLACEMENT ([place]) LEFT 10.19  01:06.9;  Surgeon: Eulogio Bear, MD;  Location: [place];  Service: Ophthalmology;  Laterality: Left;   ESOPHAGOGASTRODUODENOSCOPY (EGD) WITH PROPOFOL N/A 06/10/2015   Procedure: ESOPHAGOGASTRODUODENOSCOPY (EGD) WITH PROPOFOL;  Surgeon: Manya Silvas, MD;  Location: [facility] ENDOSCOPY;  Service: Endoscopy;  Laterality: N/A;   HERNIA REPAIR     SAVORY DILATION N/A 06/10/2015   Procedure: SAVORY DILATION;  Surgeon: Manya Silvas, MD;  Location: [facility] ENDOSCOPY;  Service: Endoscopy;  Laterality: N/A;   TONSILLECTOMY       Current Outpatient Medications  Medication Sig Dispense Refill   atorvastatin (LIPITOR) 40 MG tablet TAKE 1 TABLET BY MOUTH EVERY MORNING 90 tablet 0   cyanocobalamin 500 MCG tablet Take 2,500 mcg by mouth daily.      diltiazem (CARDIZEM) 30 MG tablet TAKE ONE TABLET EVERY FOUR HOURS AS NEEDED FOR AFIB HEART RATE >100 45 tablet 0   ELIQUIS 5 MG TABS tablet TAKE 1 TABLET BY MOUTH TWICE DAILY 60 tablet 6   ferrous sulfate 325 (65 FE) MG tablet Take 325 mg by mouth daily.     finasteride  (PROSCAR) 5 MG tablet Take 5 mg by mouth daily.     furosemide (LASIX) 20 MG tablet Take 1 tablet (20 mg total) by mouth daily as needed for fluid or edema. 30 tablet 0   losartan (COZAAR) 25 MG tablet TAKE 1 TABLET BY MOUTH DAILY 90 tablet 1   metoprolol succinate (TOPROL-XL) 25 MG 24 hr tablet Take 1 tablet (25 mg total) by mouth daily. 90 tablet 3   Multiple Vitamin (MULTIVITAMIN) tablet Take 1 tablet by mouth daily.     nitroGLYCERIN (NITROSTAT) 0.4 MG SL tablet place 1 tablet under the tongue if needed every 5 minutes fo 25 tablet 3   pantoprazole (PROTONIX) 40 MG tablet TAKE ONE TABLET EVERY DAY 30 tablet 6   traZODone (DESYREL) 50 MG tablet Take 50 mg by mouth at bedtime as needed.     No current facility-administered medications for this visit.    Allergies:   Prednisone and Terazosin    Social History:  The patient  reports that he has never smoked. He has never used smokeless tobacco. He reports current alcohol use of about 1.0 standard drink per week. He reports that he does not use drugs.   Family History:  The patient's family history includes Heart attack in his mother; Hypertension in his mother; Stroke in his mother and another family member.    ROS:  Please see the history of present illness.   Otherwise, review of systems are positive for none.   All other systems are reviewed and negative.    PHYSICAL EXAM: VS:  BP 140/60 (BP Location: Left Arm, Patient Position: Sitting, Cuff Size: Normal)   Pulse (!) 57   Ht '5\' 10"'$  (1.778 m)   Wt 200 lb 2 oz (90.8 kg)   SpO2 98%   BMI 28.71 kg/m  , BMI Body mass index is 28.71 kg/m. GEN: Well nourished, well developed, in no acute distress  HEENT: normal  Neck: no JVD, carotid bruits, or masses Cardiac: RRR; no  rubs, or gallops,no edema .  1 /6 systolic murmur at the aortic area Respiratory:  clear to auscultation bilaterally, normal work of breathing GI: soft, nontender, nondistended, + BS MS: no deformity or atrophy   Skin: warm and dry, no rash Neuro:  Strength and sensation are intact Psych: euthymic mood, full affect   EKG:  EKG is ordered today. The ekg ordered today demonstrates sinus bradycardia with old inferior infarct and poor R wave progression in the anterior leads.  Recent Labs: 04/23/2020: BUN 20; Creatinine, Ser 1.45; Hemoglobin 11.6; Platelets 253; Potassium 3.8; Sodium 137 04/24/2020: Magnesium 2.3    Lipid Panel    Component Value Date/Time   CHOL 115 10/08/2012 0554   TRIG 75 10/08/2012 0554   HDL 43 10/08/2012 0554   VLDL 15 10/08/2012 0554   LDLCALC  57 10/08/2012 0554      Wt Readings from Last 3 Encounters:  03/06/21 200 lb 2 oz (90.8 kg)  01/21/21 202 lb 8 oz (91.9 kg)  07/23/20 207 lb (93.9 kg)        ASSESSMENT AND PLAN:  1.  Coronary artery disease involving native coronary arteries without angina: He is doing well overall with no anginal symptoms.  He is not on aspirin given that he is on Eliquis.    2. Paroxysmal atrial fibrillation: The patient had recent worsening palpitations and dizziness in the setting of diarrhea.  His symptoms correlated with frequent short runs of SVT as well as a pause.  Fortunately, his symptoms resolved after his diarrhea improved.  If he has recurrent symptoms, I suspect that he will require a pacemaker in order to be able to uptitrate his medications.  3. Essential hypertension: His blood pressure is reasonably controlled on current medications and I made no changes.  4. Hyperlipidemia: I reviewed his recent labs done 2 days ago which showed an LDL of 69 which is at target.  Based on this, I advised him to continue atorvastatin 40 mg daily.  I also reviewed with him the rest of his labs which were unremarkable with the exception of stable mild anemia.    Disposition:   FU in 6 months  Signed,  Kathlyn Sacramento, MD  03/06/2021 9:03 AM    [place]

## 2021-03-07 ENCOUNTER — Other Ambulatory Visit: Payer: Self-pay | Admitting: Cardiovascular Disease

## 2021-03-31 ENCOUNTER — Telehealth: Payer: Self-pay | Admitting: Cardiovascular Disease

## 2021-03-31 DIAGNOSIS — I4891 Unspecified atrial fibrillation: Secondary | ICD-10-CM

## 2021-03-31 DIAGNOSIS — R001 Bradycardia, unspecified: Secondary | ICD-10-CM

## 2021-03-31 NOTE — Telephone Encounter (Signed)
Patient with symptoms heart skips beats, follow up from visit concerned he needs a pace maker, please assist.

## 2021-03-31 NOTE — Telephone Encounter (Signed)
Called the patient and lmom. Referral placed to EP. Scheduling will call the pt to schedule. Patient is to contact the office in the interim if any question or concerns.

## 2021-03-31 NOTE — Telephone Encounter (Addendum)
Spoke with the patient. Patient was seen on 03/06/21 by Dr. Fletcher Anon.  Per Dr. Tyrell Antonio documentation:   His symptoms correlated with frequent short runs of SVT as well as a pause.  Fortunately, his symptoms resolved after his diarrhea improved.  If he has recurrent symptoms, I suspect that he will require a pacemaker in order to be able to uptitrate his medications.   Pt was instructed to contact the office if symptoms reoccurred or worsened. Patient called to report an episode of presyncope this morning that he felt was related to a "pause is his heart". He is currently asymptomatic. Pt sts that he discussed with Dr. Fletcher Anon a referral to EP for PPM consideration if symptoms return.  Adv the patient that I will update Dr. Fletcher Anon and call back with his response. Patient adv that in the interim he is to call 911 for any reoccurrence of pre-syncope or syncope. Pt verbalized understanding.  Discussed with Dr. Fletcher Anon. Per Dr. Fletcher Anon referral to EP should be initiated.  Referral placed to EP. Message fwd to scheduling to call the patient to schedule.

## 2021-04-01 NOTE — Telephone Encounter (Signed)
Attempted to schedule.  LMOV to call office.  ° °

## 2021-04-15 NOTE — Progress Notes (Signed)
Electrophysiology Office Note:    Date:  04/16/2021   ID:  Benjamin Brewer, DOB [birth year], MRN [id number]  PCP:  Benjamin Late, MD  [facility] Cardiologist:  Kathlyn Sacramento, MD  [facility] Electrophysiologist:  Vickie Epley, MD   Referring MD: Benjamin Hampshire, MD   Chief Complaint: AF  History of Present Illness:    Benjamin Brewer is a 87 y.o. male who presents for an evaluation of AF at the request of Dr Fletcher Anon. Their medical history includes CAD, pAF, prior PCI to the LAD and RCA in 2009. The patient last saw Dr Fletcher Anon on 03/06/2021. AF dates back to 2015.He had COVID in May 2022. Since then he has struggled with dizziness and pauses in his heart rhythm. Dr Fletcher Anon expressed concern at their last appointment that the patient may benefit from a PPM given his palpitations but pauses on monitor limiting medical therapy.  Past Medical History:  Diagnosis Date   A-fib [facility])    Arthritis    hands   Benign prostatic hypertrophy    Hx of    CAD (coronary artery disease)    native vessel. Status post LAD and RCA angioplasty and drug-eluting stent placement in 2009   Dental bridge present    implants, no bridge   Dizziness    Dyslipidemia    GERD (gastroesophageal reflux disease)    History of tachycardia    Hyperlipidemia    Hypertension    Intermediate coronary syndrome (HCC)    angina, unstable   Numbness and tingling of both lower extremities    Other dyspnea and respiratory abnormality    on exertion   Pinched nerve in neck    some discomfort with side to side movement   Vertigo    approx 1x/month    Past Surgical History:  Procedure Laterality Date   [place]; x2 (last stent 2009)   [place] W/PHACO Left 07/03/2019   Procedure: CATARACT EXTRACTION PHACO AND INTRAOCULAR LENS PLACEMENT ([place]) LEFT 10.19  01:06.9;  Surgeon: Eulogio Bear, MD;  Location: [place];  Service: Ophthalmology;  Laterality: Left;   ESOPHAGOGASTRODUODENOSCOPY (EGD) WITH PROPOFOL N/A 06/10/2015   Procedure: ESOPHAGOGASTRODUODENOSCOPY (EGD) WITH PROPOFOL;  Surgeon: Manya Silvas, MD;  Location[facility] ENDOSCOPY;  Service: Endoscopy;  Laterality: N/A;   HERNIA REPAIR     SAVORY DILATION N/A 06/10/2015   Procedure: SAVORY DILATION;  Surgeon: Manya Silvas, MD;  Location: [facility] ENDOSCOPY;  Service: Endoscopy;  Laterality: N/A;   TONSILLECTOMY      Current Medications: Current Meds  Medication Sig   atorvastatin (LIPITOR) 40 MG tablet TAKE 1 TABLET BY MOUTH EVERY MORNING   cyanocobalamin 500 MCG tablet Take 2,500 mcg by mouth daily.    diltiazem (CARDIZEM) 30 MG tablet TAKE ONE TABLET EVERY FOUR HOURS AS NEEDED FOR AFIB HEART RATE >100   ELIQUIS 5 MG TABS tablet TAKE 1 TABLET BY MOUTH TWICE DAILY   ferrous sulfate 325 (65 FE) MG tablet Take 325 mg by mouth daily.   finasteride (PROSCAR) 5 MG tablet Take 5 mg by mouth daily.   furosemide (LASIX) 20 MG tablet Take 1 tablet (20 mg total) by mouth daily as needed for fluid or edema.   losartan (COZAAR) 25 MG tablet TAKE 1 TABLET BY MOUTH DAILY   metoprolol succinate (TOPROL-XL) 25 MG  24 hr tablet Take 1 tablet (25 mg total) by mouth daily.   Multiple Vitamin (MULTIVITAMIN) tablet Take 1 tablet by mouth daily.   nitroGLYCERIN (NITROSTAT) 0.4 MG SL tablet place 1 tablet under the tongue if needed every 5 minutes fo   pantoprazole (PROTONIX) 40 MG tablet TAKE 1 TABLET BY MOUTH EVERY DAY.   traZODone (DESYREL) 50 MG tablet Take 50 mg by mouth at bedtime as needed.     Allergies:   Prednisone and Terazosin   Social History   Socioeconomic History   Marital status: Widowed    Spouse name: Not on file   Number of children: 1   Years of education: Not on file   Highest education level: Bachelor's degree (e.g., BA, AB, BS)  Occupational History   Not on file  Tobacco Use   Smoking  status: Never   Smokeless tobacco: Never  Vaping Use   Vaping Use: Never used  Substance and Sexual Activity   Alcohol use: Yes    Alcohol/week: 1.0 standard drink    Types: 1 Glasses of wine per week    Comment: occasional    Drug use: No   Sexual activity: Not on file  Other Topics Concern   Not on file  Social History Narrative   Widowed retired 12/95. Gets regular exercise.       Lives at home alone   Right handed   Social Determinants of Health   Financial Resource Strain: Not on file  Food Insecurity: Not on file  Transportation Needs: Not on file  Physical Activity: Not on file  Stress: Not on file  Social Connections: Not on file     Family History: The patient's family history includes Heart attack in his mother; Hypertension in his mother; Stroke in his mother and another family member.  ROS:   Please see the history of present illness.    All other systems reviewed and are negative.  EKGs/Labs/Other Studies Reviewed:    The following studies were reviewed today:  02/12/2021 [facility] personally reviewed HR 40-182, average 70bpm 1 NSVT lasting 17 beats, avg rate 154bpm 88 SVT, longest lasting 14.7 seconds 1 pause lasting at least 4 seconds  05/09/2020 echo EF 55%     EKG:  The ekg ordered today demonstrates sinus bradycardia, low voltage QRS.  Recent Labs: 04/23/2020: BUN 20; Creatinine, Ser 1.45; Hemoglobin 11.6; Platelets 253; Potassium 3.8; Sodium 137 04/24/2020: Magnesium 2.3  Recent Lipid Panel    Component Value Date/Time   CHOL 115 10/08/2012 0554   TRIG 75 10/08/2012 0554   HDL 43 10/08/2012 0554   VLDL 15 10/08/2012 0554   LDLCALC 57 10/08/2012 0554    Physical Exam:    VS:  BP 132/80   Pulse (!) 58   Ht 5\' 10"  (1.778 m)   Wt 197 lb 9.6 oz (89.6 kg)   SpO2 98%   BMI 28.35 kg/m     Wt Readings from Last 3 Encounters:  04/16/21 197 lb 9.6 oz (89.6 kg)  03/06/21 200 lb 2 oz (90.8 kg)  01/21/21 202 lb 8 oz (91.9 kg)     GEN:   Well nourished, well developed in no acute distress HEENT: Normal NECK: No JVD; No carotid bruits LYMPHATICS: No lymphadenopathy CARDIAC: RRR, no murmurs, rubs, gallops RESPIRATORY:  Clear to auscultation without rales, wheezing or rhonchi  ABDOMEN: Soft, non-tender, non-distended MUSCULOSKELETAL:  No edema; No deformity  SKIN: Warm and dry NEUROLOGIC:  Alert and oriented x 3 PSYCHIATRIC:  Normal affect   ASSESSMENT:    1. PAF (paroxysmal atrial fibrillation) ([place])   2. Tachycardia-bradycardia syndrome ([place])   3. Primary hypertension    PLAN:    In order of problems listed above:  1. Tachycardia-bradycardia syndrome (HCC) Symptomatic.  Limits medical therapy.  Discussed using a pacemaker to help regulate his heart rhythms.  I suspect he will have a high pacing burden after implant with the addition of medical therapy for his tachycardias.  We will plan to implant a left bundle area pacemaker system.  Will be a [employer] system.  I will have him hold his Eliquis for 3 days prior to the procedure and plan to restart it 5 days after implant.  I will get a limited echo prior to implant to assess his left ventricular function.  Risks, benefits, alternatives to PPM implantation were discussed in detail with the patient today. The patient understands that the risks include but are not limited to bleeding, infection, pneumothorax, perforation, tamponade, vascular damage, renal failure, MI, stroke, death, and lead dislodgement and wishes to proceed.  We will therefore schedule device implantation at the next available time.  2. PAF (paroxysmal atrial fibrillation) (HCC) Low burden.  In sinus rhythm today.  Continue Eliquis with instructions to hold 3 days prior to the implant.  3. Primary hypertension Controlled today.      Total time spent with patient today 65 minutes. This includes reviewing records, evaluating the patient and coordinating care.  Medication Adjustments/Labs and  Tests Ordered: Current medicines are reviewed at length with the patient today.  Concerns regarding medicines are outlined above.  Orders Placed This Encounter  Procedures   EKG 12-Lead   ECHOCARDIOGRAM LIMITED    No orders of the defined types were placed in this encounter.    Signed, Hilton Cork. Quentin Ore, MD, [facility], [facility] 04/16/2021 8:58 AM    Electrophysiology [facility]

## 2021-04-15 NOTE — H&P (View-Only) (Signed)
Electrophysiology Office Note:    Date:  04/16/2021   ID:  Benjamin Brewer, DOB 04/21/1931, MRN [id number]  PCP:  Derinda Late, MD  [facility] Cardiologist:  Kathlyn Sacramento, MD  [facility] Electrophysiologist:  Vickie Epley, MD   Referring MD: Wellington Hampshire, MD   Chief Complaint: AF  History of Present Illness:    Benjamin Brewer is a 87 y.o. male who presents for an evaluation of AF at the request of Dr Fletcher Anon. Their medical history includes CAD, pAF, prior PCI to the LAD and RCA in 2009. The patient last saw Dr Fletcher Anon on 03/06/2021. AF dates back to 2015.He had COVID in May 2022. Since then he has struggled with dizziness and pauses in his heart rhythm. Dr Fletcher Anon expressed concern at their last appointment that the patient may benefit from a PPM given his palpitations but pauses on monitor limiting medical therapy.  Past Medical History:  Diagnosis Date   A-fib [facility])    Arthritis    hands   Benign prostatic hypertrophy    Hx of    CAD (coronary artery disease)    native vessel. Status post LAD and RCA angioplasty and drug-eluting stent placement in 2009   Dental bridge present    implants, no bridge   Dizziness    Dyslipidemia    GERD (gastroesophageal reflux disease)    History of tachycardia    Hyperlipidemia    Hypertension    Intermediate coronary syndrome (HCC)    angina, unstable   Numbness and tingling of both lower extremities    Other dyspnea and respiratory abnormality    on exertion   Pinched nerve in neck    some discomfort with side to side movement   Vertigo    approx 1x/month    Past Surgical History:  Procedure Laterality Date   [place]; x2 (last stent 2009)   [place] W/PHACO Left 07/03/2019   Procedure: CATARACT EXTRACTION PHACO AND INTRAOCULAR LENS PLACEMENT ([place]) LEFT 10.19  01:06.9;  Surgeon: Eulogio Bear, MD;  Location: [place];  Service: Ophthalmology;  Laterality: Left;   ESOPHAGOGASTRODUODENOSCOPY (EGD) WITH PROPOFOL N/A 06/10/2015   Procedure: ESOPHAGOGASTRODUODENOSCOPY (EGD) WITH PROPOFOL;  Surgeon: Manya Silvas, MD;  Location: [facility] ENDOSCOPY;  Service: Endoscopy;  Laterality: N/A;   HERNIA REPAIR     SAVORY DILATION N/A 06/10/2015   Procedure: SAVORY DILATION;  Surgeon: Manya Silvas, MD;  Location: [facility] ENDOSCOPY;  Service: Endoscopy;  Laterality: N/A;   TONSILLECTOMY      Current Medications: Current Meds  Medication Sig   atorvastatin (LIPITOR) 40 MG tablet TAKE 1 TABLET BY MOUTH EVERY MORNING   cyanocobalamin 500 MCG tablet Take 2,500 mcg by mouth daily.    diltiazem (CARDIZEM) 30 MG tablet TAKE ONE TABLET EVERY FOUR HOURS AS NEEDED FOR AFIB HEART RATE >100   ELIQUIS 5 MG TABS tablet TAKE 1 TABLET BY MOUTH TWICE DAILY   ferrous sulfate 325 (65 FE) MG tablet Take 325 mg by mouth daily.   finasteride (PROSCAR) 5 MG tablet Take 5 mg by mouth daily.   furosemide (LASIX) 20 MG tablet Take 1 tablet (20 mg total) by mouth daily as needed for fluid or edema.   losartan (COZAAR) 25 MG tablet TAKE 1 TABLET BY MOUTH DAILY   metoprolol succinate (TOPROL-XL) 25 MG  24 hr tablet Take 1 tablet (25 mg total) by mouth daily.   Multiple Vitamin (MULTIVITAMIN) tablet Take 1 tablet by mouth daily.   nitroGLYCERIN (NITROSTAT) 0.4 MG SL tablet place 1 tablet under the tongue if needed every 5 minutes fo   pantoprazole (PROTONIX) 40 MG tablet TAKE 1 TABLET BY MOUTH EVERY DAY.   traZODone (DESYREL) 50 MG tablet Take 50 mg by mouth at bedtime as needed.     Allergies:   Prednisone and Terazosin   Social History   Socioeconomic History   Marital status: Widowed    Spouse name: Not on file   Number of children: 1   Years of education: Not on file   Highest education level: Bachelor's degree (e.g., BA, AB, BS)  Occupational History   Not on file  Tobacco Use   Smoking  status: Never   Smokeless tobacco: Never  Vaping Use   Vaping Use: Never used  Substance and Sexual Activity   Alcohol use: Yes    Alcohol/week: 1.0 standard drink    Types: 1 Glasses of wine per week    Comment: occasional    Drug use: No   Sexual activity: Not on file  Other Topics Concern   Not on file  Social History Narrative   Widowed retired 12/95. Gets regular exercise.       Lives at home alone   Right handed   Social Determinants of Health   Financial Resource Strain: Not on file  Food Insecurity: Not on file  Transportation Needs: Not on file  Physical Activity: Not on file  Stress: Not on file  Social Connections: Not on file     Family History: The patient's family history includes Heart attack in his mother; Hypertension in his mother; Stroke in his mother and another family member.  ROS:   Please see the history of present illness.    All other systems reviewed and are negative.  EKGs/Labs/Other Studies Reviewed:    The following studies were reviewed today:  02/12/2021 [facility] personally reviewed HR 40-182, average 70bpm 1 NSVT lasting 17 beats, avg rate 154bpm 88 SVT, longest lasting 14.7 seconds 1 pause lasting at least 4 seconds  05/09/2020 echo EF 55%     EKG:  The ekg ordered today demonstrates sinus bradycardia, low voltage QRS.  Recent Labs: 04/23/2020: BUN 20; Creatinine, Ser 1.45; Hemoglobin 11.6; Platelets 253; Potassium 3.8; Sodium 137 04/24/2020: Magnesium 2.3  Recent Lipid Panel    Component Value Date/Time   CHOL 115 10/08/2012 0554   TRIG 75 10/08/2012 0554   HDL 43 10/08/2012 0554   VLDL 15 10/08/2012 0554   LDLCALC 57 10/08/2012 0554    Physical Exam:    VS:  BP 132/80   Pulse (!) 58   Ht 5\' 10"  (1.778 m)   Wt 197 lb 9.6 oz (89.6 kg)   SpO2 98%   BMI 28.35 kg/m     Wt Readings from Last 3 Encounters:  04/16/21 197 lb 9.6 oz (89.6 kg)  03/06/21 200 lb 2 oz (90.8 kg)  01/21/21 202 lb 8 oz (91.9 kg)     GEN:   Well nourished, well developed in no acute distress HEENT: Normal NECK: No JVD; No carotid bruits LYMPHATICS: No lymphadenopathy CARDIAC: RRR, no murmurs, rubs, gallops RESPIRATORY:  Clear to auscultation without rales, wheezing or rhonchi  ABDOMEN: Soft, non-tender, non-distended MUSCULOSKELETAL:  No edema; No deformity  SKIN: Warm and dry NEUROLOGIC:  Alert and oriented x 3 PSYCHIATRIC:  Normal affect   ASSESSMENT:    1. PAF (paroxysmal atrial fibrillation) ([place])   2. Tachycardia-bradycardia syndrome ([place])   3. Primary hypertension    PLAN:    In order of problems listed above:  1. Tachycardia-bradycardia syndrome (HCC) Symptomatic.  Limits medical therapy.  Discussed using a pacemaker to help regulate his heart rhythms.  I suspect he will have a high pacing burden after implant with the addition of medical therapy for his tachycardias.  We will plan to implant a left bundle area pacemaker system.  Will be a [employer] system.  I will have him hold his Eliquis for 3 days prior to the procedure and plan to restart it 5 days after implant.  I will get a limited echo prior to implant to assess his left ventricular function.  Risks, benefits, alternatives to PPM implantation were discussed in detail with the patient today. The patient understands that the risks include but are not limited to bleeding, infection, pneumothorax, perforation, tamponade, vascular damage, renal failure, MI, stroke, death, and lead dislodgement and wishes to proceed.  We will therefore schedule device implantation at the next available time.  2. PAF (paroxysmal atrial fibrillation) (HCC) Low burden.  In sinus rhythm today.  Continue Eliquis with instructions to hold 3 days prior to the implant.  3. Primary hypertension Controlled today.      Total time spent with patient today 65 minutes. This includes reviewing records, evaluating the patient and coordinating care.  Medication Adjustments/Labs and  Tests Ordered: Current medicines are reviewed at length with the patient today.  Concerns regarding medicines are outlined above.  Orders Placed This Encounter  Procedures   EKG 12-Lead   ECHOCARDIOGRAM LIMITED    No orders of the defined types were placed in this encounter.    Signed, Hilton Cork. Quentin Ore, MD, [facility], [facility] 04/16/2021 8:58 AM    Electrophysiology [facility]

## 2021-04-16 ENCOUNTER — Encounter: Payer: Self-pay | Admitting: Cardiology

## 2021-04-16 ENCOUNTER — Other Ambulatory Visit: Payer: Self-pay

## 2021-04-16 ENCOUNTER — Ambulatory Visit: Payer: PPO | Admitting: Cardiology

## 2021-04-16 VITALS — BP 132/80 | HR 58 | Ht 70.0 in | Wt 197.6 lb

## 2021-04-16 DIAGNOSIS — I495 Sick sinus syndrome: Secondary | ICD-10-CM | POA: Diagnosis not present

## 2021-04-16 DIAGNOSIS — I480 Paroxysmal atrial fibrillation: Secondary | ICD-10-CM | POA: Diagnosis not present

## 2021-04-16 DIAGNOSIS — I10 Essential (primary) hypertension: Secondary | ICD-10-CM

## 2021-04-16 NOTE — Patient Instructions (Addendum)
Medication Instructions:  Your physician recommends that you continue on your current medications as directed. Please refer to the Current Medication list given to you today. *If you need a refill on your cardiac medications before your next appointment, please call your pharmacy*  Lab Work: You will get lab work today:  BMP and CBC  If you have labs (blood work) drawn today and your tests are completely normal, you will receive your results only by: MyChart Message (if you have MyChart) OR A paper copy in the mail If you have any lab test that is abnormal or we need to change your treatment, we will call you to review the results.  Testing/Procedures: Your physician has requested that you have an echocardiogram. Echocardiography is a painless test that uses sound waves to create images of your heart. It provides your doctor with information about the size and shape of your heart and how well your heart's chambers and valves are working. This procedure takes approximately one hour. There are no restrictions for this procedure.  Please schedule for a limited ECHO prior to pacemaker placement.  April 25, 2021 at 3:00 pm at the [place] office  Follow-Up: At [facility], you and your health needs are our priority.  As part of our continuing mission to provide you with exceptional heart care, we have created designated Provider Care Teams.  These Care Teams include your primary Cardiologist (physician) and Advanced Practice Providers (APPs -  Physician Assistants and Nurse Practitioners) who all work together to provide you with the care you need, when you need it.  Your next appointment:    You will follow up with the [place] clinic 10-14 days after your procedure.  You will follow up with Dr. Quentin Ore 91 days after your procedure.     Pacemaker Implantation, Adult Pacemaker implantation is a procedure to place a pacemaker inside the chest. A pacemaker is a small computer  that sends electrical signals to the heart and helps the heart beat normally. A pacemaker also stores information about heart rhythms. You may need pacemaker implantation if you have: A slow heartbeat (bradycardia). Loss of consciousness that happens repeatedly (syncope) or repeated episodes of dizziness or light-headedness because of an irregular heart rate. Shortness of breath (dyspnea) due to heart problems. The pacemaker usually attaches to your heart through a wire called a lead. One or two leads may be needed. There are different types of pacemakers: Transvenous pacemaker. This type is placed under the skin or muscle of your upper chest area. The lead goes through a vein in the chest area to reach the inside of the heart. Epicardial pacemaker. This type is placed under the skin or muscle of your chest or abdomen. The lead goes through your chest to the outside of the heart. Tell a health care provider about: Any allergies you have. All medicines you are taking, including vitamins, herbs, eye drops, creams, and over-the-counter medicines. Any problems you or family members have had with anesthetic medicines. Any blood or bone disorders you have. Any surgeries you have had. Any medical conditions you have. Whether you are pregnant or may be pregnant. What are the risks? Generally, this is a safe procedure. However, problems may occur, including: Infection. Bleeding. Failure of the pacemaker or the lead. Collapse of a lung or bleeding into a lung. Blood clot inside a blood vessel with a lead. Damage to the heart. Infection inside the heart (endocarditis). Allergic reactions to medicines. What happens before the procedure?  Staying hydrated Follow instructions from your health care provider about hydration, which may include: Up to 2 hours before the procedure - you may continue to drink clear liquids, such as water, clear fruit juice, black coffee, and plain tea.  Eating and drinking  restrictions Follow instructions from your health care provider about eating and drinking, which may include: 8 hours before the procedure - stop eating heavy meals or foods, such as meat, fried foods, or fatty foods. 6 hours before the procedure - stop eating light meals or foods, such as toast or cereal. 6 hours before the procedure - stop drinking milk or drinks that contain milk. 2 hours before the procedure - stop drinking clear liquids. Medicines Ask your health care provider about: Changing or stopping your regular medicines. This is especially important if you are taking diabetes medicines or blood thinners. Taking medicines such as aspirin and ibuprofen. These medicines can thin your blood. Do not take these medicines unless your health care provider tells you to take them. Taking over-the-counter medicines, vitamins, herbs, and supplements. Tests You may have: A heart evaluation. This may include: An electrocardiogram (ECG). This involves placing patches on your skin to check your heart rhythm. A chest X-ray. An echocardiogram. This is a test that uses sound waves (ultrasound) to produce an image of the heart. A cardiac rhythm monitor. This is used to record your heart rhythm and any events for a longer period of time. Blood tests. Genetic testing. General instructions Do not use any products that contain nicotine or tobacco for at least 4 weeks before the procedure. These products include cigarettes, e-cigarettes, and chewing tobacco. If you need help quitting, ask your health care provider. Ask your health care provider: How your surgery site will be marked. What steps will be taken to help prevent infection. These steps may include: Removing hair at the surgery site. Washing skin with a germ-killing soap. Receiving antibiotic medicine. Plan to have someone take you home from the hospital or clinic. If you will be going home right after the procedure, plan to have someone  with you for 24 hours. What happens during the procedure? An IV will be inserted into one of your veins. You will be given one or more of the following: A medicine to help you relax (sedative). A medicine to numb the area (local anesthetic). A medicine to make you fall asleep (general anesthetic). The next steps vary depending on the type of pacemaker you will be getting. If you are getting a transvenous pacemaker: An incision will be made in your upper chest. A pocket will be made for the pacemaker. It may be placed under the skin or between layers of muscle. The lead will be inserted into a blood vessel that goes to the heart. While X-rays are taken by an imaging machine (fluoroscopy), the lead will be advanced through the vein to the inside of your heart. The other end of the lead will be tunneled under the skin and attached to the pacemaker. If you are getting an epicardial pacemaker: An incision will be made near your ribs or breastbone (sternum) for the lead. The lead will be attached to the outside of your heart. Another incision will be made in your chest or upper abdomen to create a pocket for the pacemaker. The free end of the lead will be tunneled under the skin and attached to the pacemaker. The transvenous or epicardial pacemaker will be tested. Imaging studies may be done to check the lead  position. The incisions will be closed with stitches (sutures), adhesive strips, or skin glue. Bandages (dressings) will be placed over the incisions. The procedure may vary among health care providers and hospitals. What happens after the procedure? Your blood pressure, heart rate, breathing rate, and blood oxygen level will be monitored until you leave the hospital or clinic. You may be given antibiotics. You will be given pain medicine. An ECG and chest X-rays will be done. You may need to wear a continuous type of ECG (Holter monitor) to check your heart rhythm. Your health care  provider will program the pacemaker. If you were given a sedative during the procedure, it can affect you for several hours. Do not drive or operate machinery until your health care provider says that it is safe. You will be given a pacemaker identification card. This card lists the implant date, device model, and manufacturer of your pacemaker. Summary A pacemaker is a small computer that sends electrical signals to the heart and helps the heart beat normally. There are different types of pacemakers. A pacemaker may be placed under the skin or muscle of your chest or abdomen. Follow instructions from your health care provider about eating and drinking and about taking medicines before the procedure. This information is not intended to replace advice given to you by your health care provider. Make sure you discuss any questions you have with your health care provider. Document Revised: 05/31/2019 Document Reviewed: 05/31/2019 Elsevier Patient Education  2022 [employer].

## 2021-04-17 LAB — CBC WITH DIFFERENTIAL/PLATELET

## 2021-04-17 LAB — BASIC METABOLIC PANEL
BUN/Creatinine Ratio: 18 (ref 10–24)
BUN: 22 mg/dL (ref 8–27)
CO2: 21 mmol/L (ref 20–29)
Calcium: 9.9 mg/dL (ref 8.6–10.2)
Chloride: 104 mmol/L (ref 96–106)
Creatinine, Ser: 1.2 mg/dL (ref 0.76–1.27)
Glucose: 88 mg/dL (ref 70–99)
Potassium: 4.4 mmol/L (ref 3.5–5.2)
Sodium: 140 mmol/L (ref 134–144)
eGFR: 58 mL/min/{1.73_m2} — ABNORMAL LOW (ref >59)

## 2021-04-23 ENCOUNTER — Other Ambulatory Visit: Payer: Self-pay

## 2021-04-23 DIAGNOSIS — I4891 Unspecified atrial fibrillation: Secondary | ICD-10-CM

## 2021-04-24 ENCOUNTER — Other Ambulatory Visit: Payer: Self-pay

## 2021-04-24 ENCOUNTER — Other Ambulatory Visit (INDEPENDENT_AMBULATORY_CARE_PROVIDER_SITE_OTHER): Payer: PPO

## 2021-04-24 DIAGNOSIS — I4891 Unspecified atrial fibrillation: Secondary | ICD-10-CM | POA: Diagnosis not present

## 2021-04-25 ENCOUNTER — Ambulatory Visit (INDEPENDENT_AMBULATORY_CARE_PROVIDER_SITE_OTHER): Payer: PPO

## 2021-04-25 ENCOUNTER — Other Ambulatory Visit: Payer: Self-pay | Admitting: Cardiovascular Disease

## 2021-04-25 DIAGNOSIS — I10 Essential (primary) hypertension: Secondary | ICD-10-CM

## 2021-04-25 DIAGNOSIS — I495 Sick sinus syndrome: Secondary | ICD-10-CM | POA: Diagnosis not present

## 2021-04-25 DIAGNOSIS — I480 Paroxysmal atrial fibrillation: Secondary | ICD-10-CM

## 2021-04-25 LAB — ECHOCARDIOGRAM LIMITED
Area-P 1/2: 2.85 cm2
Calc EF: 53.9 %
S' Lateral: 2.3 cm
Single Plane A2C EF: 53.7 %
Single Plane A4C EF: 52 %

## 2021-04-25 LAB — CBC WITH DIFFERENTIAL/PLATELET
Basophils Absolute: 0.1 10*3/uL (ref 0.0–0.2)
Basos: 1 %
EOS (ABSOLUTE): 0.3 10*3/uL (ref 0.0–0.4)
Eos: 5 %
Hematocrit: 37.6 % (ref 37.5–51.0)
Hemoglobin: 13 g/dL (ref 13.0–17.7)
Immature Grans (Abs): 0 10*3/uL (ref 0.0–0.1)
Immature Granulocytes: 1 %
Lymphocytes Absolute: 0.8 10*3/uL (ref 0.7–3.1)
Lymphs: 15 %
MCH: 30.1 pg (ref 26.6–33.0)
MCHC: 34.6 g/dL (ref 31.5–35.7)
MCV: 87 fL (ref 79–97)
Monocytes Absolute: 0.8 10*3/uL (ref 0.1–0.9)
Monocytes: 14 %
Neutrophils Absolute: 3.6 10*3/uL (ref 1.4–7.0)
Neutrophils: 64 %
Platelets: 264 10*3/uL (ref 150–450)
RBC: 4.32 x10E6/uL (ref 4.14–5.80)
RDW: 13.4 % (ref 11.6–15.4)
WBC: 5.6 10*3/uL (ref 3.4–10.8)

## 2021-05-05 ENCOUNTER — Ambulatory Visit (HOSPITAL_COMMUNITY)
Admission: RE | Admit: 2021-05-05 | Discharge: 2021-05-06 | Disposition: A | Discharge status: Home / Self Care | Payer: PPO | Attending: Cardiology | Admitting: Cardiology

## 2021-05-05 ENCOUNTER — Other Ambulatory Visit: Payer: Self-pay

## 2021-05-05 ENCOUNTER — Ambulatory Visit (HOSPITAL_COMMUNITY)
Admission: RE | Disposition: A | Discharge status: Home / Self Care | Payer: Self-pay | Source: Home / Self Care | Attending: Cardiology

## 2021-05-05 DIAGNOSIS — Z79899 Other long term (current) drug therapy: Secondary | ICD-10-CM | POA: Diagnosis not present

## 2021-05-05 DIAGNOSIS — Z7901 Long term (current) use of anticoagulants: Secondary | ICD-10-CM | POA: Insufficient documentation

## 2021-05-05 DIAGNOSIS — Z8616 Personal history of COVID-19: Secondary | ICD-10-CM | POA: Insufficient documentation

## 2021-05-05 DIAGNOSIS — I495 Sick sinus syndrome: Secondary | ICD-10-CM | POA: Diagnosis not present

## 2021-05-05 DIAGNOSIS — Z950 Presence of cardiac pacemaker: Secondary | ICD-10-CM

## 2021-05-05 DIAGNOSIS — Z955 Presence of coronary angioplasty implant and graft: Secondary | ICD-10-CM | POA: Insufficient documentation

## 2021-05-05 DIAGNOSIS — Z888 Allergy status to other drugs, medicaments and biological substances status: Secondary | ICD-10-CM | POA: Insufficient documentation

## 2021-05-05 DIAGNOSIS — I10 Essential (primary) hypertension: Secondary | ICD-10-CM | POA: Diagnosis not present

## 2021-05-05 DIAGNOSIS — I480 Paroxysmal atrial fibrillation: Secondary | ICD-10-CM | POA: Insufficient documentation

## 2021-05-05 DIAGNOSIS — I2510 Atherosclerotic heart disease of native coronary artery without angina pectoris: Secondary | ICD-10-CM | POA: Diagnosis not present

## 2021-05-05 HISTORY — DX: Presence of cardiac pacemaker: Z95.0

## 2021-05-05 HISTORY — PX: PACEMAKER IMPLANT: EP1218

## 2021-05-05 SURGERY — PACEMAKER IMPLANT

## 2021-05-05 MED ORDER — CEFAZOLIN SODIUM-DEXTROSE 2-4 GM/100ML-% IV SOLN
2.0000 g | INTRAVENOUS | Status: AC
  Administered 2021-05-05: 2 g via INTRAVENOUS

## 2021-05-05 MED ORDER — PANTOPRAZOLE SODIUM 40 MG PO TBEC
40.0000 mg | DELAYED_RELEASE_TABLET | Freq: Every day | ORAL | Status: DC
  Administered 2021-05-05: 40 mg via ORAL
  Filled 2021-05-05: qty 1

## 2021-05-05 MED ORDER — ATORVASTATIN CALCIUM 40 MG PO TABS
40.0000 mg | ORAL_TABLET | Freq: Every morning | ORAL | Status: DC
  Administered 2021-05-06: 40 mg via ORAL
  Filled 2021-05-05: qty 1

## 2021-05-05 MED ORDER — SODIUM CHLORIDE 0.9 % IV SOLN
INTRAVENOUS | Status: AC
  Filled 2021-05-05: qty 2

## 2021-05-05 MED ORDER — SODIUM CHLORIDE 0.9 % IV SOLN: INTRAVENOUS | Status: DC

## 2021-05-05 MED ORDER — HEPARIN (PORCINE) IN NACL 1000-0.9 UT/500ML-% IV SOLN
INTRAVENOUS | Status: AC
  Filled 2021-05-05: qty 500

## 2021-05-05 MED ORDER — METOPROLOL SUCCINATE ER 25 MG PO TB24
12.5000 mg | ORAL_TABLET | Freq: Two times a day (BID) | ORAL | Status: DC
  Administered 2021-05-05 – 2021-05-06 (×2): 12.5 mg via ORAL
  Filled 2021-05-05 (×2): qty 1

## 2021-05-05 MED ORDER — FENTANYL CITRATE (PF) 100 MCG/2ML IJ SOLN
INTRAMUSCULAR | Status: DC | PRN
  Administered 2021-05-05 (×3): 12.5 ug via INTRAVENOUS

## 2021-05-05 MED ORDER — ONDANSETRON HCL 4 MG/2ML IJ SOLN: 4.0000 mg | Freq: Four times a day (QID) | INTRAMUSCULAR | Status: DC | PRN

## 2021-05-05 MED ORDER — FINASTERIDE 5 MG PO TABS
5.0000 mg | ORAL_TABLET | Freq: Every day | ORAL | Status: DC
  Administered 2021-05-06: 5 mg via ORAL
  Filled 2021-05-05: qty 1

## 2021-05-05 MED ORDER — CHLORHEXIDINE GLUCONATE 4 % EX LIQD
4.0000 "application " | Freq: Once | CUTANEOUS | Status: DC
  Filled 2021-05-05: qty 60

## 2021-05-05 MED ORDER — MIDAZOLAM HCL 5 MG/5ML IJ SOLN
INTRAMUSCULAR | Status: DC | PRN
  Administered 2021-05-05 (×3): .5 mg via INTRAVENOUS

## 2021-05-05 MED ORDER — MIDAZOLAM HCL 5 MG/5ML IJ SOLN
INTRAMUSCULAR | Status: AC
  Filled 2021-05-05: qty 5

## 2021-05-05 MED ORDER — CEFAZOLIN SODIUM-DEXTROSE 2-4 GM/100ML-% IV SOLN
INTRAVENOUS | Status: AC
  Filled 2021-05-05: qty 100

## 2021-05-05 MED ORDER — FENTANYL CITRATE (PF) 100 MCG/2ML IJ SOLN
INTRAMUSCULAR | Status: AC
  Filled 2021-05-05: qty 2

## 2021-05-05 MED ORDER — LIDOCAINE HCL (PF) 1 % IJ SOLN
INTRAMUSCULAR | Status: AC
  Filled 2021-05-05: qty 60

## 2021-05-05 MED ORDER — ACETAMINOPHEN 325 MG PO TABS
325.0000 mg | ORAL_TABLET | ORAL | Status: DC | PRN
  Administered 2021-05-05: 650 mg via ORAL
  Filled 2021-05-05: qty 2

## 2021-05-05 MED ORDER — POVIDONE-IODINE 10 % EX SWAB: 2.0000 "application " | Freq: Once | CUTANEOUS | Status: DC

## 2021-05-05 MED ORDER — LIDOCAINE HCL (PF) 1 % IJ SOLN
INTRAMUSCULAR | Status: DC | PRN
  Administered 2021-05-05: 60 mL

## 2021-05-05 MED ORDER — LOSARTAN POTASSIUM 25 MG PO TABS
25.0000 mg | ORAL_TABLET | Freq: Every day | ORAL | Status: DC
  Administered 2021-05-06: 25 mg via ORAL
  Filled 2021-05-05: qty 1

## 2021-05-05 MED ORDER — SODIUM CHLORIDE 0.9 % IV SOLN
80.0000 mg | INTRAVENOUS | Status: AC
  Administered 2021-05-05 (×2): 80 mg

## 2021-05-05 SURGICAL SUPPLY — 16 items
CABLE SURGICAL S-101-97-12 (CABLE) ×2 IMPLANT
CATH RIGHTSITE C315HIS02 (CATHETERS) ×2 IMPLANT
IPG PACE AZUR XT DR MRI W1DR01 (Pacemaker) ×1 IMPLANT
LEAD CAPSURE NOVUS 5076-52CM (Lead) ×2 IMPLANT
LEAD CAPSURE NOVUS 5076-58CM (Lead) ×2 IMPLANT
LEAD SELECT SECURE 3830 383069 (Lead) ×1 IMPLANT
MAT PREVALON FULL STRYKER (MISCELLANEOUS) ×2 IMPLANT
PACE AZURE XT DR MRI W1DR01 (Pacemaker) ×2 IMPLANT
PAD PRO RADIOLUCENT 2001M-C (PAD) ×2 IMPLANT
SELECT SECURE 3830 383069 (Lead) ×2 IMPLANT
SHEATH 7FR PRELUDE SNAP 13 (SHEATH) ×6 IMPLANT
SHEATH 9FR PRELUDE SNAP 13 (SHEATH) ×2 IMPLANT
SHEATH PROBE COVER 6X72 (BAG) ×4 IMPLANT
SLITTER 6232ADJ (MISCELLANEOUS) ×2 IMPLANT
TRAY PACEMAKER INSERTION (PACKS) ×2 IMPLANT
WIRE HI TORQ VERSACORE-J 145CM (WIRE) ×4 IMPLANT

## 2021-05-05 NOTE — Interval H&P Note (Signed)
History and Physical Interval Note:  05/05/2021 2:30 PM  Benjamin Brewer  has presented today for surgery, with the diagnosis of bradycardia.  The various methods of treatment have been discussed with the patient and family. After consideration of risks, benefits and other options for treatment, the patient has consented to  Procedure(s): PACEMAKER IMPLANT (N/A) as a surgical intervention.  The patient's history has been reviewed, patient examined, no change in status, stable for surgery.  I have reviewed the patient's chart and labs.  Questions were answered to the patient's satisfaction.     [place] Sheriff T Margarito Dehaas

## 2021-05-05 NOTE — Discharge Instructions (Addendum)
    Supplemental Discharge Instructions for  Pacemaker/Defibrillator Patients  Tomorrow, 05/06/21, send in a device transmission  Activity No heavy lifting or vigorous activity with your left/right arm for 6 to 8 weeks.  Do not raise your left/right arm above your head for one week.  Gradually raise your affected arm as drawn below.             05/10/21                   05/11/21                 05/12/21                  05/13/21 __  NO DRIVING for   1 week  ; you may begin driving on   58/8/50 .  WOUND CARE Keep the wound area clean and dry.  Do not get this area wet , no showers for one week; you may shower on  05/13/21   . Tomorrow, 05/06/21, remove the arm sling Tomorrow, 05/06/21 remove the LARGE outer plastic bandage.  Underneath the plastic bandage there are steri strips (paper tapes), DO NOT remove these. The tape/steri-strips on your wound will fall off; do not pull them off.  No bandage is needed on the site.  DO  NOT apply any creams, oils, or ointments to the wound area. If you notice any drainage or discharge from the wound, any swelling or bruising at the site, or you develop a fever > 101? F after you are discharged home, call the office at once.  Special Instructions You are still able to use cellular telephones; use the ear opposite the side where you have your pacemaker/defibrillator.  Avoid carrying your cellular phone near your device. When traveling through airports, show security personnel your identification card to avoid being screened in the metal detectors.  Ask the security personnel to use the hand wand. Avoid arc welding equipment, MRI testing (magnetic resonance imaging), TENS units (transcutaneous nerve stimulators).  Call the office for questions about other devices. Avoid electrical appliances that are in poor condition or are not properly grounded. Microwave ovens are safe to be near or to operate.

## 2021-05-06 ENCOUNTER — Encounter (HOSPITAL_COMMUNITY): Payer: Self-pay | Admitting: Cardiology

## 2021-05-06 ENCOUNTER — Ambulatory Visit (HOSPITAL_COMMUNITY): Payer: PPO

## 2021-05-06 DIAGNOSIS — I10 Essential (primary) hypertension: Secondary | ICD-10-CM | POA: Diagnosis not present

## 2021-05-06 DIAGNOSIS — Z950 Presence of cardiac pacemaker: Secondary | ICD-10-CM | POA: Diagnosis not present

## 2021-05-06 DIAGNOSIS — I495 Sick sinus syndrome: Secondary | ICD-10-CM | POA: Diagnosis not present

## 2021-05-06 DIAGNOSIS — Z888 Allergy status to other drugs, medicaments and biological substances status: Secondary | ICD-10-CM | POA: Diagnosis not present

## 2021-05-06 DIAGNOSIS — I700 Atherosclerosis of aorta: Secondary | ICD-10-CM | POA: Diagnosis not present

## 2021-05-06 DIAGNOSIS — I480 Paroxysmal atrial fibrillation: Secondary | ICD-10-CM | POA: Diagnosis not present

## 2021-05-06 DIAGNOSIS — Z79899 Other long term (current) drug therapy: Secondary | ICD-10-CM | POA: Diagnosis not present

## 2021-05-06 DIAGNOSIS — Z955 Presence of coronary angioplasty implant and graft: Secondary | ICD-10-CM | POA: Diagnosis not present

## 2021-05-06 DIAGNOSIS — I2510 Atherosclerotic heart disease of native coronary artery without angina pectoris: Secondary | ICD-10-CM | POA: Diagnosis not present

## 2021-05-06 DIAGNOSIS — Z7901 Long term (current) use of anticoagulants: Secondary | ICD-10-CM | POA: Diagnosis not present

## 2021-05-06 DIAGNOSIS — Z8616 Personal history of COVID-19: Secondary | ICD-10-CM | POA: Diagnosis not present

## 2021-05-06 MED FILL — Heparin Sod (Porcine)-NaCl IV Soln 1000 Unit/500ML-0.9%: INTRAVENOUS | Qty: 500 | Status: AC

## 2021-05-06 NOTE — Progress Notes (Signed)
Discharge instructions (including medications) discussed with and copy provided to patient/caregiver 

## 2021-05-06 NOTE — Plan of Care (Signed)
  Problem: Education: Goal: Knowledge of General Education information will improve Description: Including pain rating scale, medication(s)/side effects and non-pharmacologic comfort measures Outcome: Adequate for Discharge   

## 2021-05-06 NOTE — Discharge Summary (Signed)
ELECTROPHYSIOLOGY PROCEDURE DISCHARGE SUMMARY    Patient ID: Benjamin Brewer,  MRN: [id number], DOB/AGE: [birth year] 85 y.o.  Admit date: 05/05/2021 Discharge date: 05/06/2021  Primary Care Physician: Derinda Late, MD  Primary Cardiologist: Dr. Fletcher Anon Electrophysiologist: Dr. Quentin Ore  Primary Discharge Diagnosis:  Tachy-brady s/p PPM  Secondary Discharge Diagnosis:  CAD HTN HLD Paroxysmal Afib CHA2DS2Vasc is 4, on eliquis  Allergies  Allergen Reactions   Prednisone     "they think threw me into A-fib"   Terazosin Other (See Comments)    Muscle Pain     Procedures This Admission:  1.  Implantation of a MDT dual chamber PPM on 05/05/21 by Dr Quentin Ore.   The patient received   Azure XT DR MRI SureScan, serial [id number], model [id number], serial [id number] (RA), [id number], serial number [id number] ([id number]) There were no immediate post procedure complications. CXR on 05/06/21 demonstrated no pneumothorax status post device implantation.   Brief HPI: Benjamin Brewer is a 87 y.o. male was referred to electrophysiology in the outpatient setting for consideration of PPM implantation.  Past medical history includes above.  The patient has tachy-brady syndrome recommended PPM.  Risks, benefits, and alternatives to PPM implantation were reviewed with the patient who wished to proceed.   Hospital Course:  The patient was admitted and underwent implantation of a PPM with details as outlined above.  He was monitored on telemetry overnight which demonstrated A paced/V sensing.  Left chest was without hematoma or ecchymosis.  The device was interrogated and found to be functioning normally.  CXR was obtained and demonstrated no pneumothorax status post device implantation.  Wound care, arm mobility, and restrictions were reviewed with the patient.  The patient feels well, denies any CP/SOB, with minimal site discomfort.  He was examined by Dr. Quentin Ore and considered stable for discharge to home.     Physical Exam: Vitals:   05/05/21 2011 05/05/21 2102 05/06/21 0500 05/06/21 0618  BP: 130/82 114/62  (!) 153/87  Pulse: 61 60  69  Resp:    18  Temp:  98.5 F (36.9 C)  98.4 F (36.9 C)  TempSrc:  Oral  Oral  SpO2: 98% 97%  96%  Weight:   87.8 kg   Height:        GEN- The patient is well appearing, alert and oriented x 3 today.   HEENT: normocephalic, atraumatic; sclera clear, conjunctiva pink; hearing intact; oropharynx clear; neck supple, no JVP Lungs- CTA b/l, normal work of breathing.  No wheezes, rales, rhonchi Heart- RRR, no murmurs, rubs or gallops, PMI not laterally displaced GI- soft, non-tender, non-distended Extremities- no clubbing, cyanosis, or edema MS- no significant deformity or atrophy Skin- warm and dry, no rash or lesion, left chest without hematoma/ecchymosis Psych- euthymic mood, full affect Neuro- no gross deficits   Labs:   Lab Results  Component Value Date   WBC 5.6 04/24/2021   HGB 13.0 04/24/2021   HCT 37.6 04/24/2021   MCV 87 04/24/2021   PLT 264 04/24/2021   No results for input(s): NA, K, CL, CO2, BUN, CREATININE, CALCIUM, PROT, BILITOT, ALKPHOS, ALT, AST, GLUCOSE in the last 168 hours.  Invalid input(s): LABALBU  Discharge Medications:  Allergies as of 05/06/2021       Reactions   Prednisone    "they think threw me into A-fib"   Terazosin Other (See Comments)   Muscle Pain        Medication List     TAKE these medications  atorvastatin 40 MG tablet Commonly known as: LIPITOR TAKE 1 TABLET BY MOUTH EVERY MORNING   diltiazem 30 MG tablet Commonly known as: CARDIZEM TAKE ONE TABLET EVERY FOUR HOURS AS NEEDED FOR AFIB HEART RATE >100   Eliquis 5 MG Tabs tablet Generic drug: apixaban TAKE 1 TABLET BY MOUTH TWICE DAILY Notes to patient: DO NOT resume until 05/11/21 morning dose   ferrous sulfate 325 (65 FE) MG tablet Take 325 mg by mouth 2 (two) times daily.   finasteride 5 MG tablet Commonly known as:  PROSCAR Take 5 mg by mouth daily.   LAXATIVE PO Take 1 tablet by mouth daily as needed (constipation).   losartan 25 MG tablet Commonly known as: COZAAR TAKE 1 TABLET BY MOUTH DAILY   LUBRICATING EYE DROPS OP Place 1 drop into both eyes daily as needed (dry eyes).   metoprolol succinate 25 MG 24 hr tablet Commonly known as: TOPROL-XL Take 1 tablet (25 mg total) by mouth daily. What changed:  how much to take when to take this   multivitamin tablet Take 1 tablet by mouth daily.   nitroGLYCERIN 0.4 MG SL tablet Commonly known as: NITROSTAT place 1 tablet under the tongue if needed every 5 minutes fo What changed: See the new instructions.   pantoprazole 40 MG tablet Commonly known as: PROTONIX TAKE 1 TABLET BY MOUTH EVERY DAY. What changed: when to take this   PreserVision AREDS 2 Caps Take 1 capsule by mouth 2 (two) times daily.   vitamin B-12 500 MCG tablet Commonly known as: CYANOCOBALAMIN Take 500 mcg by mouth 2 (two) times daily.   VITAMIN D3 PO Take 1 capsule by mouth daily.               Discharge Care Instructions  (From admission, onward)           Start     Ordered   05/06/21 0000  Discharge wound care:       Comments: As noted in AVS   05/06/21 1027            Disposition: Home Discharge Instructions     Diet - low sodium heart healthy   Complete by: As directed    Discharge wound care:   Complete by: As directed    As noted in AVS   Increase activity slowly   Complete by: As directed        Follow-up Information     [facility].   Specialty: Cardiology Why: 05/15/21 @ 10:00AM, wound check visit Contact information: [address], [address] [place]        Vickie Epley, MD Follow up.   Specialties: Cardiology, Radiology Why: 08/06/21 @ 10:00AM Contact information: [place] [address] [phone]                  Duration of Discharge Encounter: Greater than 30 minutes including physician time.  Venetia Night, PA-C 05/06/2021 10:28 AM

## 2021-05-12 ENCOUNTER — Other Ambulatory Visit: Payer: Self-pay | Admitting: Cardiovascular Disease

## 2021-05-13 DIAGNOSIS — M25512 Pain in left shoulder: Secondary | ICD-10-CM | POA: Diagnosis not present

## 2021-05-13 DIAGNOSIS — G479 Sleep disorder, unspecified: Secondary | ICD-10-CM | POA: Diagnosis not present

## 2021-05-15 ENCOUNTER — Other Ambulatory Visit: Payer: Self-pay

## 2021-05-15 ENCOUNTER — Ambulatory Visit (INDEPENDENT_AMBULATORY_CARE_PROVIDER_SITE_OTHER): Payer: PPO

## 2021-05-15 DIAGNOSIS — I495 Sick sinus syndrome: Secondary | ICD-10-CM

## 2021-05-15 LAB — CUP PACEART INCLINIC DEVICE CHECK
Battery Remaining Longevity: 163 mo
Battery Voltage: 3.22 V
Brady Statistic AP VP Percent: 0.03 %
Brady Statistic AP VS Percent: 42.82 %
Brady Statistic AS VP Percent: 0.02 %
Brady Statistic AS VS Percent: 57.13 %
Brady Statistic RA Percent Paced: 42.96 %
Brady Statistic RV Percent Paced: 0.06 %
Date Time Interrogation Session: 20221103100820
Implantable Lead Implant Date: 20221024
Implantable Lead Implant Date: 20221024
Implantable Lead Location: 753859
Implantable Lead Location: 753860
Implantable Lead Model: 5076
Implantable Lead Model: 5076
Implantable Pulse Generator Implant Date: 20221024
Lead Channel Impedance Value: 380 Ohm
Lead Channel Impedance Value: 437 Ohm
Lead Channel Impedance Value: 475 Ohm
Lead Channel Impedance Value: 532 Ohm
Lead Channel Pacing Threshold Amplitude: 0.5 V
Lead Channel Pacing Threshold Amplitude: 0.5 V
Lead Channel Pacing Threshold Pulse Width: 0.4 ms
Lead Channel Pacing Threshold Pulse Width: 0.4 ms
Lead Channel Sensing Intrinsic Amplitude: 1.75 mV
Lead Channel Sensing Intrinsic Amplitude: 8.375 mV
Lead Channel Setting Pacing Amplitude: 3.5 V
Lead Channel Setting Pacing Amplitude: 3.5 V
Lead Channel Setting Pacing Pulse Width: 0.4 ms
Lead Channel Setting Sensing Sensitivity: 0.9 mV

## 2021-05-15 NOTE — Patient Instructions (Signed)

## 2021-05-15 NOTE — Progress Notes (Signed)

## 2021-05-23 DIAGNOSIS — H35373 Puckering of macula, bilateral: Secondary | ICD-10-CM | POA: Diagnosis not present

## 2021-05-23 DIAGNOSIS — H43813 Vitreous degeneration, bilateral: Secondary | ICD-10-CM | POA: Diagnosis not present

## 2021-05-23 DIAGNOSIS — Z961 Presence of intraocular lens: Secondary | ICD-10-CM | POA: Diagnosis not present

## 2021-05-23 DIAGNOSIS — H353134 Nonexudative age-related macular degeneration, bilateral, advanced atrophic with subfoveal involvement: Secondary | ICD-10-CM | POA: Diagnosis not present

## 2021-05-23 DIAGNOSIS — R197 Diarrhea, unspecified: Secondary | ICD-10-CM | POA: Diagnosis not present

## 2021-05-23 DIAGNOSIS — Z20822 Contact with and (suspected) exposure to covid-19: Secondary | ICD-10-CM | POA: Diagnosis not present

## 2021-05-23 DIAGNOSIS — R109 Unspecified abdominal pain: Secondary | ICD-10-CM | POA: Diagnosis not present

## 2021-05-23 DIAGNOSIS — I10 Essential (primary) hypertension: Secondary | ICD-10-CM | POA: Diagnosis not present

## 2021-08-05 NOTE — Progress Notes (Signed)
Electrophysiology Office Follow up Visit Note:    Date:  08/06/2021   ID:  Benjamin Brewer, DOB Oct 08, 1930, MRN [id number]  PCP:  Derinda Late, MD  [facility] Cardiologist:  Kathlyn Sacramento, MD  [facility] Electrophysiologist:  Vickie Epley, MD    Interval History:    Benjamin Brewer is a 88 y.o. male who presents for a follow up visit after PPM implant 05/05/2021 for tachy-brady syndrome.  He has done well after pacemaker implant.  No problems with the incision.      Past Medical History:  Diagnosis Date   A-fib [facility])    Arthritis    hands   Benign prostatic hypertrophy    Hx of    CAD (coronary artery disease)    native vessel. Status post LAD and RCA angioplasty and drug-eluting stent placement in 2009   Dental bridge present    implants, no bridge   Dizziness    Dyslipidemia    GERD (gastroesophageal reflux disease)    History of tachycardia    Hyperlipidemia    Hypertension    Intermediate coronary syndrome (HCC)    angina, unstable   Numbness and tingling of both lower extremities    Other dyspnea and respiratory abnormality    on exertion   Pinched nerve in neck    some discomfort with side to side movement   Vertigo    approx 1x/month    Past Surgical History:  Procedure Laterality Date   [place]; x2 (last stent 2009)   [place] W/PHACO Left 07/03/2019   Procedure: CATARACT EXTRACTION PHACO AND INTRAOCULAR LENS PLACEMENT ([place]) LEFT 10.19  01:06.9;  Surgeon: Eulogio Bear, MD;  Location: [place];  Service: Ophthalmology;  Laterality: Left;   ESOPHAGOGASTRODUODENOSCOPY (EGD) WITH PROPOFOL N/A 06/10/2015   Procedure: ESOPHAGOGASTRODUODENOSCOPY (EGD) WITH PROPOFOL;  Surgeon: Manya Silvas, MD;  Location: [facility] ENDOSCOPY;  Service: Endoscopy;  Laterality: N/A;   HERNIA REPAIR     PACEMAKER IMPLANT N/A  05/05/2021   Procedure: PACEMAKER IMPLANT;  Surgeon: Vickie Epley, MD;  Location: [facility] CV LAB;  Service: Cardiovascular;  Laterality: N/A;   SAVORY DILATION N/A 06/10/2015   Procedure: SAVORY DILATION;  Surgeon: Manya Silvas, MD;  Location: [facility] ENDOSCOPY;  Service: Endoscopy;  Laterality: N/A;   TONSILLECTOMY      Current Medications: Current Meds  Medication Sig   atorvastatin (LIPITOR) 40 MG tablet TAKE 1 TABLET BY MOUTH EVERY MORNING   Bisacodyl (LAXATIVE PO) Take 1 tablet by mouth daily as needed (constipation).   Carboxymethylcellul-Glycerin (LUBRICATING EYE DROPS OP) Place 1 drop into both eyes daily as needed (dry eyes).   Cholecalciferol (VITAMIN D3 PO) Take 1 capsule by mouth daily.   cyanocobalamin 500 MCG tablet Take 500 mcg by mouth 2 (two) times daily.   diltiazem (CARDIZEM) 30 MG tablet TAKE ONE TABLET EVERY FOUR HOURS AS NEEDED FOR AFIB HEART RATE >100   ELIQUIS 5 MG TABS tablet TAKE 1 TABLET BY MOUTH TWICE DAILY   ferrous sulfate 325 (65 FE) MG tablet Take 325 mg by mouth daily with breakfast.   finasteride (PROSCAR) 5 MG tablet Take 5 mg by mouth daily.   gabapentin (NEURONTIN) 300 MG capsule Take 300 mg by mouth at bedtime.   losartan (COZAAR) 25 MG tablet TAKE 1 TABLET BY MOUTH DAILY  metoprolol succinate (TOPROL XL) 25 MG 24 hr tablet Take 1 tablet (25 mg total) by mouth in the morning and at bedtime.   Multiple Vitamin (MULTIVITAMIN) tablet Take 1 tablet by mouth daily.   Multiple Vitamins-Minerals (PRESERVISION AREDS 2) CAPS Take 1 capsule by mouth 2 (two) times daily.   nitroGLYCERIN (NITROSTAT) 0.4 MG SL tablet place 1 tablet under the tongue if needed every 5 minutes fo (Patient taking differently: Place 0.4 mg under the tongue every 5 (five) minutes as needed for chest pain.)   pantoprazole (PROTONIX) 40 MG tablet TAKE 1 TABLET BY MOUTH EVERY DAY. (Patient taking differently: Take 40 mg by mouth at bedtime.)   [DISCONTINUED] metoprolol succinate  (TOPROL-XL) 25 MG 24 hr tablet Take 1 tablet (25 mg total) by mouth daily. (Patient taking differently: Take 12.5 mg by mouth 2 (two) times daily.)     Allergies:   Prednisone and Terazosin   Social History   Socioeconomic History   Marital status: Widowed    Spouse name: Not on file   Number of children: 1   Years of education: Not on file   Highest education level: Bachelor's degree (e.g., BA, AB, BS)  Occupational History   Not on file  Tobacco Use   Smoking status: Never   Smokeless tobacco: Never  Vaping Use   Vaping Use: Never used  Substance and Sexual Activity   Alcohol use: Yes    Alcohol/week: 1.0 standard drink    Types: 1 Glasses of wine per week    Comment: occasional    Drug use: No   Sexual activity: Not on file  Other Topics Concern   Not on file  Social History Narrative   Widowed retired 12/95. Gets regular exercise.       Lives at home alone   Right handed   Social Determinants of Health   Financial Resource Strain: Not on file  Food Insecurity: Not on file  Transportation Needs: Not on file  Physical Activity: Not on file  Stress: Not on file  Social Connections: Not on file     Family History: The patient's family history includes Heart attack in his mother; Hypertension in his mother; Stroke in his mother and another family member.  ROS:   Please see the history of present illness.    All other systems reviewed and are negative.  EKGs/Labs/Other Studies Reviewed:    The following studies were reviewed today:  August 06, 2021 in clinic device interrogation personally reviewed Battery longevity 14 years Lead parameter stable Atrially pacing 59.9% Ventricular pacing less than 0.1%  EKG:  The ekg ordered today demonstrates atrial pacing, ventricular sensing  Recent Labs: 04/16/2021: BUN 22; Creatinine, Ser 1.20; Potassium 4.4; Sodium 140 04/24/2021: Hemoglobin 13.0; Platelets 264  Recent Lipid Panel    Component Value Date/Time    CHOL 115 10/08/2012 0554   TRIG 75 10/08/2012 0554   HDL 43 10/08/2012 0554   VLDL 15 10/08/2012 0554   LDLCALC 57 10/08/2012 0554    Physical Exam:    VS:  BP (!) 144/80 (BP Location: Left Arm, Patient Position: Sitting, Cuff Size: Normal)    Pulse 67    Ht 5\' 10"  (1.778 m)    Wt 207 lb (93.9 kg)    SpO2 98%    BMI 29.70 kg/m     Wt Readings from Last 3 Encounters:  08/06/21 207 lb (93.9 kg)  05/06/21 193 lb 9.6 oz (87.8 kg)  04/16/21 197 lb 9.6 oz (89.6  kg)     GEN:  Well nourished, well developed in no acute distress HEENT: Normal NECK: No JVD; No carotid bruits LYMPHATICS: No lymphadenopathy CARDIAC: RRR, no murmurs, rubs, gallops.  Pacemaker pocket well-healed RESPIRATORY:  Clear to auscultation without rales, wheezing or rhonchi  ABDOMEN: Soft, non-tender, non-distended MUSCULOSKELETAL:  No edema; No deformity  SKIN: Warm and dry NEUROLOGIC:  Alert and oriented x 3 PSYCHIATRIC:  Normal affect        ASSESSMENT:    1. Tachy-brady syndrome ([place])   2. Cardiac pacemaker in situ   3. Atrial fibrillation, unspecified type ([place])   4. Primary hypertension   5. Sinus bradycardia    PLAN:    In order of problems listed above:  #Tachybradycardia syndrome #Pacemaker in situ Device functioning appropriately.  Continue remote follow-up.  #Atrial fibrillation On Eliquis for stroke prophylaxis Very low burden of A. fib on the device interrogation.  Continue monitoring burden on future interrogations  #Hypertension Slightly above goal today.  Recommend continued monitoring of blood pressures 1-2 times per week at home.  He should bring these recordings to his primary care office.    Follow-up in 9 months.    Medication Adjustments/Labs and Tests Ordered: Current medicines are reviewed at length with the patient today.  Concerns regarding medicines are outlined above.  Orders Placed This Encounter  Procedures   EKG 12-Lead   Meds ordered this encounter   Medications   metoprolol succinate (TOPROL XL) 25 MG 24 hr tablet    Sig: Take 1 tablet (25 mg total) by mouth in the morning and at bedtime.    Dispense:  180 tablet    Refill:  3     Signed, Lars Mage, MD, [facility], [facility] 08/06/2021 9:21 PM    Electrophysiology [facility]

## 2021-08-06 ENCOUNTER — Encounter: Payer: Self-pay | Admitting: Cardiology

## 2021-08-06 ENCOUNTER — Ambulatory Visit (INDEPENDENT_AMBULATORY_CARE_PROVIDER_SITE_OTHER): Payer: PPO

## 2021-08-06 ENCOUNTER — Other Ambulatory Visit: Payer: Self-pay

## 2021-08-06 ENCOUNTER — Ambulatory Visit (INDEPENDENT_AMBULATORY_CARE_PROVIDER_SITE_OTHER): Payer: PPO | Admitting: Cardiology

## 2021-08-06 VITALS — BP 144/80 | HR 67 | Ht 70.0 in | Wt 207.0 lb

## 2021-08-06 DIAGNOSIS — I495 Sick sinus syndrome: Secondary | ICD-10-CM

## 2021-08-06 DIAGNOSIS — I4891 Unspecified atrial fibrillation: Secondary | ICD-10-CM

## 2021-08-06 DIAGNOSIS — I10 Essential (primary) hypertension: Secondary | ICD-10-CM

## 2021-08-06 DIAGNOSIS — R001 Bradycardia, unspecified: Secondary | ICD-10-CM

## 2021-08-06 DIAGNOSIS — Z950 Presence of cardiac pacemaker: Secondary | ICD-10-CM

## 2021-08-06 LAB — CUP PACEART REMOTE DEVICE CHECK
Battery Remaining Longevity: 169 mo
Battery Voltage: 3.2 V
Brady Statistic AP VP Percent: 0.04 %
Brady Statistic AP VS Percent: 59.62 %
Brady Statistic AS VP Percent: 0.03 %
Brady Statistic AS VS Percent: 40.31 %
Brady Statistic RA Percent Paced: 59.8 %
Brady Statistic RV Percent Paced: 0.08 %
Date Time Interrogation Session: 20230124222354
Implantable Lead Implant Date: 20221024
Implantable Lead Implant Date: 20221024
Implantable Lead Location: 753859
Implantable Lead Location: 753860
Implantable Lead Model: 5076
Implantable Lead Model: 5076
Implantable Pulse Generator Implant Date: 20221024
Lead Channel Impedance Value: 304 Ohm
Lead Channel Impedance Value: 380 Ohm
Lead Channel Impedance Value: 475 Ohm
Lead Channel Impedance Value: 475 Ohm
Lead Channel Pacing Threshold Amplitude: 0.5 V
Lead Channel Pacing Threshold Amplitude: 0.625 V
Lead Channel Pacing Threshold Pulse Width: 0.4 ms
Lead Channel Pacing Threshold Pulse Width: 0.4 ms
Lead Channel Sensing Intrinsic Amplitude: 0.375 mV
Lead Channel Sensing Intrinsic Amplitude: 0.375 mV
Lead Channel Sensing Intrinsic Amplitude: 8.375 mV
Lead Channel Sensing Intrinsic Amplitude: 8.375 mV
Lead Channel Setting Pacing Amplitude: 1.5 V
Lead Channel Setting Pacing Amplitude: 2 V
Lead Channel Setting Pacing Pulse Width: 0.4 ms
Lead Channel Setting Sensing Sensitivity: 0.9 mV

## 2021-08-06 MED ORDER — METOPROLOL SUCCINATE ER 25 MG PO TB24: 25.0000 mg | ORAL_TABLET | Freq: Two times a day (BID) | ORAL | 3 refills | Status: DC

## 2021-08-06 NOTE — Patient Instructions (Addendum)
Medications: Increase Metoprolol Succinate to 25 mg two times daily Your physician recommends that you continue on your current medications as directed. Please refer to the Current Medication list given to you today. *If you need a refill on your cardiac medications before your next appointment, please call your pharmacy*  Lab Work: None. If you have labs (blood work) drawn today and your tests are completely normal, you will receive your results only by: [place] (if you have MyChart) OR A paper copy in the mail If you have any lab test that is abnormal or we need to change your treatment, we will call you to review the results.  Testing/Procedures: None.  Follow-Up: At [facility], you and your health needs are our priority.  As part of our continuing mission to provide you with exceptional heart care, we have created designated Provider Care Teams.  These Care Teams include your primary Cardiologist (physician) and Advanced Practice Providers (APPs -  Physician Assistants and Nurse Practitioners) who all work together to provide you with the care you need, when you need it.  Your physician wants you to follow-up in: 9 months with Lars Mage or one of the following Advanced Practice Providers on your designated Care Team:    Ignacia Bayley, NP Christell Faith PA Cadence Kathlen Mody PA    You will receive a reminder letter in the mail two months in advance. If you don't receive a letter, please call our office to schedule the follow-up appointment.  Remote monitoring is used to monitor your Pacemaker from home. This monitoring reduces the number of office visits required to check your device to one time per year. It allows [place] to keep an eye on the functioning of your device to ensure it is working properly. You are scheduled for a device check from home on 11/05/21. You may send your transmission at any time that day. If you have a wireless device, the transmission will be sent  automatically. After your physician reviews your transmission, you will receive a postcard with your next transmission date.  We recommend signing up for the patient portal called "MyChart".  Sign up information is provided on this After Visit Summary.  MyChart is used to connect with patients for Virtual Visits (Telemedicine).  Patients are able to view lab/test results, encounter notes, upcoming appointments, etc.  Non-urgent messages can be sent to your provider as well.   To learn more about what you can do with MyChart, go to [email or web address].    Any Other Special Instructions Will Be Listed Below (If Applicable).

## 2021-08-12 ENCOUNTER — Other Ambulatory Visit: Payer: Self-pay | Admitting: Cardiovascular Disease

## 2021-08-15 NOTE — Progress Notes (Signed)
Remote pacemaker transmission.   

## 2021-08-21 ENCOUNTER — Other Ambulatory Visit: Payer: Self-pay

## 2021-08-21 ENCOUNTER — Encounter: Payer: Self-pay | Admitting: Cardiovascular Disease

## 2021-08-21 ENCOUNTER — Ambulatory Visit: Payer: PPO | Admitting: Cardiovascular Disease

## 2021-08-21 VITALS — BP 128/70 | HR 67 | Ht 70.0 in | Wt 208.2 lb

## 2021-08-21 DIAGNOSIS — E785 Hyperlipidemia, unspecified: Secondary | ICD-10-CM | POA: Diagnosis not present

## 2021-08-21 DIAGNOSIS — I480 Paroxysmal atrial fibrillation: Secondary | ICD-10-CM | POA: Diagnosis not present

## 2021-08-21 DIAGNOSIS — I10 Essential (primary) hypertension: Secondary | ICD-10-CM | POA: Diagnosis not present

## 2021-08-21 DIAGNOSIS — I2510 Atherosclerotic heart disease of native coronary artery without angina pectoris: Secondary | ICD-10-CM | POA: Diagnosis not present

## 2021-08-21 NOTE — Progress Notes (Signed)
Cardiology Office Note   Date:  08/21/2021   ID:  [name], DOB [birth year], MRN [id number]  PCP:  Derinda Late, MD  Cardiologist:   Kathlyn Sacramento, MD   Chief Complaint  Patient presents with   Other    6 month f/u c/o lightheadedness when bending over and standing up. Meds reviewed verbally with pt.      History of Present Illness: Benjamin Brewer is a 88 y.o. male who presents for a followup visit regarding coronary artery disease and paroxysmal atrial fibrillation . He is status post angioplasty and drug-eluting stent placement to the LAD and RCA in October 14, 2007.  He was diagnosed with atrial fibrillation with rapid ventricular response in 10-13-13 and has been on anticoagulation since then.  Echocardiogram in July 2019 showed EF of 60 to 65%, calcified aortic valve without significant stenosis, mildly dilated left atrium and no significant pulmonary hypertension. The patient's wife died in 10/13/2017 and he had significant grief, anxiety and depression since then. He was diagnosed with cervical spine disease in October 14, 2018.  Treatment with prednisone was associated with worsening atrial fibrillation.  He was diagnosed with COVID-19 infection in May and had severe intermittent diarrhea since then.  In that setting, he had worsening palpitations with episodes of dizziness with a sense of pause in his heart.  A 14-day ZIO monitor was done which showed sinus rhythm with an average heart rate of 70 bpm.  There was 1 run of ventricular tachycardia that lasted 17 beats and he had 88 runs of supraventricular tachycardia the longest lasted 15 seconds.  He had one 4.1-second pause.  The patient continues to have intermittent palpitations as well as presyncopal episodes and thus I referred him to Dr. Quentin Ore for tachybradycardia syndrome.  He underwent pacemaker placement in October without complications.  Since then, he has felt significantly better.  The dose of Toprol was increased to 25 mg twice daily.   No chest pain, shortness of breath or palpitations at the present time.  He only gets dizzy when he stands up quickly.  He continues to have issues with diarrhea.  Past Medical History:  Diagnosis Date   A-fib [facility])    Arthritis    hands   Benign prostatic hypertrophy    Hx of    CAD (coronary artery disease)    native vessel. Status post LAD and RCA angioplasty and drug-eluting stent placement in October 14, 2007   Dental bridge present    implants, no bridge   Dizziness    Dyslipidemia    GERD (gastroesophageal reflux disease)    History of tachycardia    Hyperlipidemia    Hypertension    Intermediate coronary syndrome (HCC)    angina, unstable   Numbness and tingling of both lower extremities    Other dyspnea and respiratory abnormality    on exertion   Pinched nerve in neck    some discomfort with side to side movement   Vertigo    approx 1x/month    Past Surgical History:  Procedure Laterality Date   [place]; x2 (last stent 10/14/2007)   [place] W/PHACO Left 07/03/2019   Procedure: CATARACT EXTRACTION PHACO AND INTRAOCULAR LENS PLACEMENT ([place]) LEFT 10.19  01:06.9;  Surgeon: Eulogio Bear, MD;  Location: [place];  Service: Ophthalmology;  Laterality: Left;   ESOPHAGOGASTRODUODENOSCOPY (EGD)  WITH PROPOFOL N/A 06/10/2015   Procedure: ESOPHAGOGASTRODUODENOSCOPY (EGD) WITH PROPOFOL;  Surgeon: Manya Silvas, MD;  Location: [facility] ENDOSCOPY;  Service: Endoscopy;  Laterality: N/A;   HERNIA REPAIR     PACEMAKER IMPLANT N/A 05/05/2021   Procedure: PACEMAKER IMPLANT;  Surgeon: Vickie Epley, MD;  Location: [place] CV LAB;  Service: Cardiovascular;  Laterality: N/A;   SAVORY DILATION N/A 06/10/2015   Procedure: SAVORY DILATION;  Surgeon: Manya Silvas, MD;  [facility] ENDOSCOPY;  Service: Endoscopy;  Laterality: N/A;   TONSILLECTOMY        Current Outpatient Medications  Medication Sig Dispense Refill   atorvastatin (LIPITOR) 40 MG tablet TAKE 1 TABLET BY MOUTH EVERY MORNING 90 tablet 0   Bisacodyl (LAXATIVE PO) Take 1 tablet by mouth daily as needed (constipation).     Carboxymethylcellul-Glycerin (LUBRICATING EYE DROPS OP) Place 1 drop into both eyes daily as needed (dry eyes).     Cholecalciferol (VITAMIN D3 PO) Take 1 capsule by mouth daily.     cyanocobalamin 500 MCG tablet Take 500 mcg by mouth 2 (two) times daily.     diltiazem (CARDIZEM) 30 MG tablet TAKE ONE TABLET EVERY FOUR HOURS AS NEEDED FOR AFIB HEART RATE >100 45 tablet 0   ELIQUIS 5 MG TABS tablet TAKE 1 TABLET BY MOUTH TWICE DAILY 60 tablet 6   ferrous sulfate 325 (65 FE) MG tablet Take 325 mg by mouth daily with breakfast.     losartan (COZAAR) 25 MG tablet TAKE 1 TABLET BY MOUTH DAILY 90 tablet 1   metoprolol succinate (TOPROL XL) 25 MG 24 hr tablet Take 1 tablet (25 mg total) by mouth in the morning and at bedtime. 180 tablet 3   Multiple Vitamin (MULTIVITAMIN) tablet Take 1 tablet by mouth daily.     Multiple Vitamins-Minerals (PRESERVISION AREDS 2) CAPS Take 1 capsule by mouth 2 (two) times daily.     nitroGLYCERIN (NITROSTAT) 0.4 MG SL tablet place 1 tablet under the tongue if needed every 5 minutes fo (Patient taking differently: Place 0.4 mg under the tongue every 5 (five) minutes as needed for chest pain.) 25 tablet 3   pantoprazole (PROTONIX) 40 MG tablet TAKE 1 TABLET BY MOUTH EVERY DAY. (Patient taking differently: Take 40 mg by mouth at bedtime.) 30 tablet 5   finasteride (PROSCAR) 5 MG tablet Take 5 mg by mouth daily. (Patient not taking: Reported on 08/21/2021)     gabapentin (NEURONTIN) 300 MG capsule Take 300 mg by mouth at bedtime. (Patient not taking: Reported on 08/21/2021)     No current facility-administered medications for this visit.    Allergies:   Prednisone and Terazosin    Social History:  The patient  reports that he has never  smoked. He has never used smokeless tobacco. He reports current alcohol use of about 1.0 standard drink per week. He reports that he does not use drugs.   Family History:  The patient's family history includes Heart attack in his mother; Hypertension in his mother; Stroke in his mother and another family member.    ROS:  Please see the history of present illness.   Otherwise, review of systems are positive for none.   All other systems are reviewed and negative.    PHYSICAL EXAM: VS:  BP 128/70 (BP Location: Left Arm, Patient Position: Sitting, Cuff Size: Normal)    Pulse 67    Ht 5\' 10"  (1.778 m)    Wt 208 lb 4 oz (94.5 kg)  SpO2 98%    BMI 29.88 kg/m  , BMI Body mass index is 29.88 kg/m. GEN: Well nourished, well developed, in no acute distress  HEENT: normal  Neck: no JVD, carotid bruits, or masses Cardiac: RRR; no  rubs, or gallops,no edema .  1 /6 systolic murmur at the aortic area Respiratory:  clear to auscultation bilaterally, normal work of breathing GI: soft, nontender, nondistended, + BS MS: no deformity or atrophy  Skin: warm and dry, no rash Neuro:  Strength and sensation are intact Psych: euthymic mood, full affect   EKG:  EKG is ordered today. The ekg ordered today demonstrates atrial paced rhythm with prolonged AV conduction, left axis deviation and low voltage.  Poor R wave progression in the precordial leads.  Recent Labs: 04/16/2021: BUN 22; Creatinine, Ser 1.20; Potassium 4.4; Sodium 140 04/24/2021: Hemoglobin 13.0; Platelets 264    Lipid Panel    Component Value Date/Time   CHOL 115 10/08/2012 0554   TRIG 75 10/08/2012 0554   HDL 43 10/08/2012 0554   VLDL 15 10/08/2012 0554   LDLCALC 57 10/08/2012 0554      Wt Readings from Last 3 Encounters:  08/21/21 208 lb 4 oz (94.5 kg)  08/06/21 207 lb (93.9 kg)  05/06/21 193 lb 9.6 oz (87.8 kg)        ASSESSMENT AND PLAN:  1.  Coronary artery disease involving native coronary arteries without angina:  He is doing well overall with no anginal symptoms.  He is not on aspirin given that he is on Eliquis.    2. Paroxysmal atrial fibrillation: He is currently in paced rhythm with no evidence of atrial fibrillation.  Palpitations improved with increasing the dose of Toprol.  He is tolerating anticoagulation with no side effects.  I reviewed his most recent labs which showed stable anemia.  3. Essential hypertension: His blood pressure was elevated recently when he saw Dr. Quentin Ore but is well controlled today.  Continue losartan and Toprol.  4. Hyperlipidemia: I reviewed most recent lipid profile done in August which showed an LDL of 69 which is at target of less than 70.  Continue current dose of atorvastatin.  5.  Diarrhea: This has been an intermittent issue for him since last year after COVID.  I asked him to discuss with his primary care physician possible referral to gastroenterology.   Disposition:   FU in 6 months  Signed,  Kathlyn Sacramento, MD  08/21/2021 8:27 AM    [place]

## 2021-08-21 NOTE — Patient Instructions (Signed)

## 2021-09-10 DIAGNOSIS — N1831 Chronic kidney disease, stage 3a: Secondary | ICD-10-CM | POA: Diagnosis not present

## 2021-09-10 DIAGNOSIS — E7800 Pure hypercholesterolemia, unspecified: Secondary | ICD-10-CM | POA: Diagnosis not present

## 2021-09-10 DIAGNOSIS — Z1331 Encounter for screening for depression: Secondary | ICD-10-CM | POA: Diagnosis not present

## 2021-09-10 DIAGNOSIS — Z0000 Encounter for general adult medical examination without abnormal findings: Secondary | ICD-10-CM | POA: Diagnosis not present

## 2021-09-27 ENCOUNTER — Other Ambulatory Visit: Payer: Self-pay | Admitting: Cardiovascular Disease

## 2021-11-05 ENCOUNTER — Ambulatory Visit (INDEPENDENT_AMBULATORY_CARE_PROVIDER_SITE_OTHER): Payer: PPO

## 2021-11-05 DIAGNOSIS — I495 Sick sinus syndrome: Secondary | ICD-10-CM | POA: Diagnosis not present

## 2021-11-06 LAB — CUP PACEART REMOTE DEVICE CHECK
Battery Remaining Longevity: 164 mo
Battery Voltage: 3.16 V
Brady Statistic AP VP Percent: 0.09 %
Brady Statistic AP VS Percent: 71.84 %
Brady Statistic AS VP Percent: 0.02 %
Brady Statistic AS VS Percent: 28.05 %
Brady Statistic RA Percent Paced: 72.08 %
Brady Statistic RV Percent Paced: 0.11 %
Date Time Interrogation Session: 20230425200349
Implantable Lead Implant Date: 20221024
Implantable Lead Implant Date: 20221024
Implantable Lead Location: 753859
Implantable Lead Location: 753860
Implantable Lead Model: 5076
Implantable Lead Model: 5076
Implantable Pulse Generator Implant Date: 20221024
Lead Channel Impedance Value: 323 Ohm
Lead Channel Impedance Value: 380 Ohm
Lead Channel Impedance Value: 437 Ohm
Lead Channel Impedance Value: 475 Ohm
Lead Channel Pacing Threshold Amplitude: 0.5 V
Lead Channel Pacing Threshold Amplitude: 0.625 V
Lead Channel Pacing Threshold Pulse Width: 0.4 ms
Lead Channel Pacing Threshold Pulse Width: 0.4 ms
Lead Channel Sensing Intrinsic Amplitude: 0.25 mV
Lead Channel Sensing Intrinsic Amplitude: 0.25 mV
Lead Channel Sensing Intrinsic Amplitude: 8.625 mV
Lead Channel Sensing Intrinsic Amplitude: 8.625 mV
Lead Channel Setting Pacing Amplitude: 1.5 V
Lead Channel Setting Pacing Amplitude: 2 V
Lead Channel Setting Pacing Pulse Width: 0.4 ms
Lead Channel Setting Sensing Sensitivity: 0.9 mV

## 2021-11-10 DIAGNOSIS — H524 Presbyopia: Secondary | ICD-10-CM | POA: Diagnosis not present

## 2021-11-14 ENCOUNTER — Other Ambulatory Visit: Payer: Self-pay | Admitting: Cardiovascular Disease

## 2021-11-17 DIAGNOSIS — M5432 Sciatica, left side: Secondary | ICD-10-CM | POA: Diagnosis not present

## 2021-11-20 NOTE — Progress Notes (Signed)
Remote pacemaker transmission.   

## 2021-11-25 DIAGNOSIS — M5432 Sciatica, left side: Secondary | ICD-10-CM | POA: Diagnosis not present

## 2021-12-01 ENCOUNTER — Telehealth: Payer: Self-pay | Admitting: Cardiology

## 2021-12-01 NOTE — Telephone Encounter (Signed)
Pt would like a callback regarding device. Pt also states that he received a letter in the mail stating that he needed to f/u with nurse Short every month. Please advise

## 2021-12-02 NOTE — Telephone Encounter (Signed)
I spoke with the patient and let him know that he is not followed by [facility]. He states because of his back been hurting that his heart been skipping beats and going really fast for short periods of time. I asked the patient to send a transmission. I gave him verbal instructions as well. He agreed to send the transmission.

## 2021-12-10 NOTE — Telephone Encounter (Signed)
I called patient because we still has not received his transmission. No answer/ voicemail full.

## 2021-12-18 NOTE — Telephone Encounter (Signed)
Letter sent 12/18/2021 

## 2021-12-25 ENCOUNTER — Telehealth: Payer: Self-pay

## 2021-12-25 NOTE — Telephone Encounter (Signed)
I have spoken with patient and he does not want to use the APP on his phone for his PPM because it is too complex for him. I have ordered patient a relay monitor and he will receive in 7-10 business days and will call us when he receives it. Patient has device clinic direct number

## 2022-01-21 ENCOUNTER — Encounter (HOSPITAL_COMMUNITY): Payer: Self-pay

## 2022-01-21 ENCOUNTER — Emergency Department (HOSPITAL_COMMUNITY)
Admission: EM | Admit: 2022-01-21 | Discharge: 2022-01-21 | Disposition: A | Discharge status: Home / Self Care | Payer: PPO | Attending: Emergency Medicine | Admitting: Emergency Medicine

## 2022-01-21 ENCOUNTER — Other Ambulatory Visit: Payer: Self-pay

## 2022-01-21 ENCOUNTER — Emergency Department (HOSPITAL_COMMUNITY): Payer: PPO

## 2022-01-21 DIAGNOSIS — R55 Syncope and collapse: Secondary | ICD-10-CM | POA: Diagnosis not present

## 2022-01-21 DIAGNOSIS — Z950 Presence of cardiac pacemaker: Secondary | ICD-10-CM | POA: Diagnosis not present

## 2022-01-21 DIAGNOSIS — Z7901 Long term (current) use of anticoagulants: Secondary | ICD-10-CM | POA: Insufficient documentation

## 2022-01-21 LAB — URINALYSIS, ROUTINE W REFLEX MICROSCOPIC
Bacteria, UA: NONE SEEN
Bilirubin Urine: NEGATIVE
Glucose, UA: NEGATIVE mg/dL
Hgb urine dipstick: NEGATIVE
Ketones, ur: NEGATIVE mg/dL
Nitrite: NEGATIVE
Protein, ur: NEGATIVE mg/dL
Specific Gravity, Urine: 1.014 (ref 1.005–1.030)
pH: 5 (ref 5.0–8.0)

## 2022-01-21 LAB — CBC
HCT: 38.6 % — ABNORMAL LOW (ref 39.0–52.0)
Hemoglobin: 13 g/dL (ref 13.0–17.0)
MCH: 31.6 pg (ref 26.0–34.0)
MCHC: 33.7 g/dL (ref 30.0–36.0)
MCV: 93.9 fL (ref 80.0–100.0)
Platelets: 236 10*3/uL (ref 150–400)
RBC: 4.11 MIL/uL — ABNORMAL LOW (ref 4.22–5.81)
RDW: 13.2 % (ref 11.5–15.5)
WBC: 6 10*3/uL (ref 4.0–10.5)
nRBC: 0 % (ref 0.0–0.2)

## 2022-01-21 LAB — BASIC METABOLIC PANEL
Anion gap: 10 (ref 5–15)
BUN: 16 mg/dL (ref 8–23)
CO2: 24 mmol/L (ref 22–32)
Calcium: 9.3 mg/dL (ref 8.9–10.3)
Chloride: 106 mmol/L (ref 98–111)
Creatinine, Ser: 1.49 mg/dL — ABNORMAL HIGH (ref 0.61–1.24)
GFR, Estimated: 45 mL/min — ABNORMAL LOW (ref >60)
Glucose, Bld: 112 mg/dL — ABNORMAL HIGH (ref 70–99)
Potassium: 4 mmol/L (ref 3.5–5.1)
Sodium: 140 mmol/L (ref 135–145)

## 2022-01-21 NOTE — ED Notes (Signed)
Patient ambulates to the room with out complication from the lobby

## 2022-01-21 NOTE — ED Notes (Signed)
This RN attempted to interrogate pace maker at this time however the technology is not working properly at this time provider made aware and this RN to try again soon

## 2022-01-21 NOTE — ED Triage Notes (Signed)
Patient reports was standing at the sink and had near syncopal and then broke out in a sweat. Complains of lightheadedness.  Patient on eliquis.

## 2022-01-21 NOTE — ED Notes (Signed)
Pacemaker successfully interrogated at this time provider made aware

## 2022-01-21 NOTE — ED Provider Notes (Signed)
[facility] EMERGENCY DEPARTMENT Provider Note   CSN: [id number] Arrival date & time: 01/21/22  0757     History  Chief Complaint  Patient presents with   Near Syncope    Benjamin Brewer is a 88 y.o. male.  HPI He is here for evaluation of transient lightheadedness with feeling like he would fall while standing at the sink this morning after taking his morning medications.  He called a family member who brought him here.  He lives alone.  He has not eaten anything today during his prolonged wait in the waiting room prior to my evaluating him.  I saw the patient at 6:20 PM.  At this time he is alert and comfortable and states he feels back to normal.  He denies recent illnesses including fever, chills, cough, vomiting, disorders of bowel or urine.    Home Medications Prior to Admission medications   Medication Sig Start Date End Date Taking? Authorizing Provider  atorvastatin (LIPITOR) 40 MG tablet TAKE 1 TABLET BY MOUTH EVERY MORNING 11/17/21   Wellington Hampshire, MD  Bisacodyl (LAXATIVE PO) Take 1 tablet by mouth daily as needed (constipation).    [provider]  Carboxymethylcellul-Glycerin (LUBRICATING EYE DROPS OP) Place 1 drop into both eyes daily as needed (dry eyes).    [provider]  Cholecalciferol (VITAMIN D3 PO) Take 1 capsule by mouth daily.    [provider]  cyanocobalamin 500 MCG tablet Take 500 mcg by mouth 2 (two) times daily.    [provider]  diltiazem (CARDIZEM) 30 MG tablet TAKE ONE TABLET EVERY FOUR HOURS AS NEEDED FOR AFIB HEART RATE >100 06/04/20   Wellington Hampshire, MD  ELIQUIS 5 MG TABS tablet TAKE 1 TABLET BY MOUTH TWICE DAILY 01/31/18   Rise Mu, PA-C  ferrous sulfate 325 (65 FE) MG tablet Take 325 mg by mouth daily with breakfast.    [provider]  finasteride (PROSCAR) 5 MG tablet Take 5 mg by mouth daily. Patient not taking: Reported on 08/21/2021 09/28/18   [provider]   gabapentin (NEURONTIN) 300 MG capsule Take 300 mg by mouth at bedtime. Patient not taking: Reported on 08/21/2021 06/11/21   [provider]  losartan (COZAAR) 25 MG tablet TAKE 1 TABLET BY MOUTH DAILY 11/17/21   Wellington Hampshire, MD  metoprolol succinate (TOPROL XL) 25 MG 24 hr tablet Take 1 tablet (25 mg total) by mouth in the morning and at bedtime. 08/06/21   Vickie Epley, MD  Multiple Vitamin (MULTIVITAMIN) tablet Take 1 tablet by mouth daily.    [provider]  Multiple Vitamins-Minerals (PRESERVISION AREDS 2) CAPS Take 1 capsule by mouth 2 (two) times daily.    [provider]  nitroGLYCERIN (NITROSTAT) 0.4 MG SL tablet place 1 tablet under the tongue if needed every 5 minutes fo Patient taking differently: Place 0.4 mg under the tongue every 5 (five) minutes as needed for chest pain. 03/29/17   Wellington Hampshire, MD  pantoprazole (PROTONIX) 40 MG tablet Take 1 tablet (40 mg total) by mouth at bedtime. 09/29/21   Wellington Hampshire, MD      Allergies    Prednisone and Terazosin    Review of Systems   Review of Systems  Physical Exam Updated Vital Signs BP (!) 149/62   Pulse 67   Temp 97.8 F (36.6 C) (Oral)   Resp 17   Ht '5\' 10"'$  (1.778 m)   Wt 94.3 kg  SpO2 99%   BMI 29.84 kg/m  Physical Exam Vitals and nursing note reviewed.  Constitutional:      General: He is not in acute distress.    Appearance: He is well-developed. He is not ill-appearing or diaphoretic.  HENT:     Head: Normocephalic and atraumatic.     Right Ear: External ear normal.     Left Ear: External ear normal.  Eyes:     Conjunctiva/sclera: Conjunctivae normal.     Pupils: Pupils are equal, round, and reactive to light.  Neck:     Trachea: Phonation normal.  Cardiovascular:     Rate and Rhythm: Normal rate and regular rhythm.     Heart sounds: Normal heart sounds.  Pulmonary:     Effort: Pulmonary effort is normal.     Breath sounds: Normal breath sounds.   Abdominal:     General: There is no distension.     Palpations: Abdomen is soft.     Tenderness: There is no abdominal tenderness.  Musculoskeletal:        General: Normal range of motion.     Cervical back: Normal range of motion and neck supple.  Skin:    General: Skin is warm and dry.  Neurological:     Mental Status: He is alert and oriented to person, place, and time.     Cranial Nerves: No cranial nerve deficit.     Sensory: No sensory deficit.     Motor: No abnormal muscle tone.     Coordination: Coordination normal.  Psychiatric:        Mood and Affect: Mood normal.        Behavior: Behavior normal.        Thought Content: Thought content normal.        Judgment: Judgment normal.     ED Results / Procedures / Treatments   Labs (all labs ordered are listed, but only abnormal results are displayed) Labs Reviewed  BASIC METABOLIC PANEL - Abnormal; Notable for the following components:      Result Value   Glucose, Bld 112 (*)    Creatinine, Ser 1.49 (*)    GFR, Estimated 45 (*)    All other components within normal limits  CBC - Abnormal; Notable for the following components:   RBC 4.11 (*)    HCT 38.6 (*)    All other components within normal limits  URINALYSIS, ROUTINE W REFLEX MICROSCOPIC - Abnormal; Notable for the following components:   APPearance HAZY (*)    Leukocytes,Ua TRACE (*)    All other components within normal limits  CBG MONITORING, ED    EKG EKG Interpretation  Date/Time:  Wednesday January 21 2022 08:23:08 EDT Ventricular Rate:  63 PR Interval:  206 QRS Duration: 102 QT Interval:  434 QTC Calculation: 444 R Axis:   -37 Text Interpretation: Atrial-paced rhythm Left axis deviation Low voltage QRS Incomplete right bundle branch block Inferior infarct , age undetermined Cannot rule out Anterior infarct , age undetermined Abnormal ECG When compared with ECG of 06-May-2021 06:24, PREVIOUS ECG IS PRESENT No significant change was found Confirmed by  Daleen Bo [phone]) on 01/21/2022 6:23:27 PM  Radiology CT HEAD WO CONTRAST  Result Date: 01/21/2022 CLINICAL DATA:  Near syncope EXAM: CT HEAD WITHOUT CONTRAST TECHNIQUE: Contiguous axial images were obtained from the base of the skull through the vertex without intravenous contrast. RADIATION DOSE REDUCTION: This exam was performed according to the departmental dose-optimization program which includes automated exposure control, adjustment  of the mA and/or kV according to patient size and/or use of iterative reconstruction technique. COMPARISON:  10/28/2018 and MRI brain from 11/18/2018 FINDINGS: Brain: The brainstem, cerebellum, cerebral peduncles, thalami, basal ganglia, basilar cisterns, and ventricular system appear within normal limits. No intracranial hemorrhage, mass lesion, or acute CVA. Vascular: There is atherosclerotic calcification of the cavernous carotid arteries bilaterally. Skull: Unremarkable Sinuses/Orbits: Unremarkable Other: Unremarkable IMPRESSION: 1. No acute intracranial findings. 2. Atherosclerosis. Electronically Signed   By: Van Clines M.D.   On: 01/21/2022 09:06    Procedures Procedures    Medications Ordered in ED Medications - No data to display  ED Course/ Medical Decision Making/ A&P                           Medical Decision Making Patient with brief episode of near syncope about 15 seconds which resolved spontaneously.  He was able to ambulate afterwards and come here by private vehicle for evaluation.  He has not had this happen before.  He has a cardiac pacemaker.  Pacemaker will be interrogated.  Problems Addressed: Cardiac pacemaker:    Details: Pacemaker was interrogated and there was no arrhythmias today Near syncope:    Details: Nonspecific  Amount and/or Complexity of Data Reviewed Independent Historian:     Details: He is a cogent historian Labs: ordered.    Details: CBC, metabolic panel, urinalysis -- normal except creatinine  slightly high, glucose of Radiology: ordered.    Details: CT head, no acute intracranial abnormalities ECG/medicine tests: ordered and independent interpretation performed.    Details: Cardiac monitor -- AV pacing without significant arrhythmias  Risk Decision regarding hospitalization. Risk Details: Brief episode of near syncope with spontaneous resolution to normal condition.  Pacemaker interrogated and does not show events today.  He did have some V. tach, about a week ago.  Patient was not aware that.  Metabolically stable today with reassuring vital signs.  He does not require admission.           Final Clinical Impression(s) / ED Diagnoses Final diagnoses:  Near syncope  Cardiac pacemaker    Rx / DC Orders ED Discharge Orders     None         Daleen Bo, MD 01/21/22 2305

## 2022-01-21 NOTE — ED Notes (Signed)
This RN reports [employer] report called over the phone to the provider Dr Eulis Foster at this time through secure chat

## 2022-01-21 NOTE — Discharge Instructions (Addendum)
The testing today did not show any serious problems.  We interrogated your cardiac pacemaker which did not show any episodes around the time of your events earlier today.  There were some events about a week ago.  Please call your cardiologist that helps manage your pacemaker to discuss ongoing treatment and any modifications that might be necessary.  Return here if needed.

## 2022-02-03 ENCOUNTER — Encounter: Payer: Self-pay | Admitting: Cardiovascular Disease

## 2022-02-03 ENCOUNTER — Ambulatory Visit: Payer: PPO | Admitting: Cardiovascular Disease

## 2022-02-03 ENCOUNTER — Ambulatory Visit (INDEPENDENT_AMBULATORY_CARE_PROVIDER_SITE_OTHER): Payer: PPO

## 2022-02-03 VITALS — BP 120/70 | HR 62 | Ht 70.0 in | Wt 205.1 lb

## 2022-02-03 DIAGNOSIS — I2510 Atherosclerotic heart disease of native coronary artery without angina pectoris: Secondary | ICD-10-CM

## 2022-02-03 DIAGNOSIS — E785 Hyperlipidemia, unspecified: Secondary | ICD-10-CM

## 2022-02-03 DIAGNOSIS — R55 Syncope and collapse: Secondary | ICD-10-CM

## 2022-02-03 DIAGNOSIS — I480 Paroxysmal atrial fibrillation: Secondary | ICD-10-CM | POA: Diagnosis not present

## 2022-02-03 DIAGNOSIS — R0602 Shortness of breath: Secondary | ICD-10-CM | POA: Diagnosis not present

## 2022-02-03 MED ORDER — NITROGLYCERIN 0.4 MG SL SUBL: SUBLINGUAL_TABLET | SUBLINGUAL | 3 refills | Status: DC

## 2022-02-03 NOTE — Progress Notes (Signed)
Cardiology Office Note   Date:  02/03/2022   ID:  Benjamin Brewer, DOB Dec 07, 1930, MRN [id number]  PCP:  Derinda Late, MD  Cardiologist:   Kathlyn Sacramento, MD   Chief Complaint  Patient presents with   Other    F/u ER syncope c/o lightheadedness. Meds reviewed verbally with pt.      History of Present Illness: Benjamin Brewer is a 88 y.o. male who presents for a followup visit regarding coronary artery disease and paroxysmal atrial fibrillation . He is status post angioplasty and drug-eluting stent placement to the LAD and RCA in 22-Oct-2007.  He was diagnosed with atrial fibrillation with rapid ventricular response in 2013-10-21 and has been on anticoagulation since then.  Echocardiogram in July 2019 showed EF of 60 to 65%, calcified aortic valve without significant stenosis, mildly dilated left atrium and no significant pulmonary hypertension. The patient's wife died in October 21, 2017 and he had significant grief, anxiety and depression since then. He was diagnosed with cervical spine disease in 10/22/2018.  Treatment with prednisone was associated with worsening atrial fibrillation.  He was diagnosed with COVID-19 infection in May of 2022 and had severe intermittent diarrhea since then.  In that setting, he had worsening palpitations with episodes of dizziness with a sense of pause in his heart.  A 14-day ZIO monitor was done which showed sinus rhythm with an average heart rate of 70 bpm.  There was 1 run of ventricular tachycardia that lasted 17 beats and he had 88 runs of supraventricular tachycardia the longest lasted 15 seconds.  He had one 4.1-second pause.  The patient continued to have intermittent palpitations as well as presyncopal episodes and thus I referred him to Dr. Quentin Ore for tachybradycardia syndrome.  He underwent pacemaker placement in October of 2022.  The dose of Toprol was increased to 25 mg twice daily.     He went to the emergency room recently with dizziness and presyncope.  Blood  pressure was 149/62 with a heart rate of 67.  Pacemaker interrogation was unremarkable.  CT head showed no acute abnormalities.  Labs did show slight elevation of creatinine at 1.49 but he does have underlying chronic kidney disease.  Hemoglobin was stable at 13.  He denies chest pain but reports worsening exertional dyspnea since then.  He also reports sinus congestion and drainage.  He still feels off balance and dizzy. He felt that his heart stopped for few seconds recently.  Past Medical History:  Diagnosis Date   A-fib [facility])    Arthritis    hands   Benign prostatic hypertrophy    Hx of    CAD (coronary artery disease)    native vessel. Status post LAD and RCA angioplasty and drug-eluting stent placement in Oct 22, 2007   Dental bridge present    implants, no bridge   Dizziness    Dyslipidemia    GERD (gastroesophageal reflux disease)    History of tachycardia    Hyperlipidemia    Hypertension    Intermediate coronary syndrome (HCC)    angina, unstable   Numbness and tingling of both lower extremities    Other dyspnea and respiratory abnormality    on exertion   Pinched nerve in neck    some discomfort with side to side movement   Vertigo    approx 1x/month    Past Surgical History:  Procedure Laterality Date   [place]; x2 (last stent 10/22/07)   [place]  [place]   CATARACT EXTRACTION W/PHACO Left 07/03/2019   Procedure: CATARACT EXTRACTION PHACO AND INTRAOCULAR LENS PLACEMENT (IOC) LEFT 10.19  01:06.9;  Surgeon: Eulogio Bear, MD;  Location: [place];  Service: Ophthalmology;  Laterality: Left;   ESOPHAGOGASTRODUODENOSCOPY (EGD) WITH PROPOFOL N/A 06/10/2015   Procedure: ESOPHAGOGASTRODUODENOSCOPY (EGD) WITH PROPOFOL;  Surgeon: Manya Silvas, MD;  Location[facility] ENDOSCOPY;  Service: Endoscopy;  Laterality: N/A;   HERNIA REPAIR     PACEMAKER IMPLANT N/A 05/05/2021    Procedure: PACEMAKER IMPLANT;  Surgeon: Vickie Epley, MD;  Location: [facility];  Service: Cardiovascular;  Laterality: N/A;   SAVORY DILATION N/A 06/10/2015   Procedure: SAVORY DILATION;  Surgeon: Manya Silvas, MD;  Location: [facility] ENDOSCOPY;  Service: Endoscopy;  Laterality: N/A;   TONSILLECTOMY       Current Outpatient Medications  Medication Sig Dispense Refill   atorvastatin (LIPITOR) 40 MG tablet TAKE 1 TABLET BY MOUTH EVERY MORNING 90 tablet 0   Bisacodyl (LAXATIVE PO) Take 1 tablet by mouth daily as needed (constipation).     Carboxymethylcellul-Glycerin (LUBRICATING EYE DROPS OP) Place 1 drop into both eyes daily as needed (dry eyes).     Cholecalciferol (VITAMIN D3 PO) Take 1 capsule by mouth daily.     cyanocobalamin 500 MCG tablet Take 500 mcg by mouth 2 (two) times daily.     diltiazem (CARDIZEM) 30 MG tablet TAKE ONE TABLET EVERY FOUR HOURS AS NEEDED FOR AFIB HEART RATE >100 45 tablet 0   ELIQUIS 5 MG TABS tablet TAKE 1 TABLET BY MOUTH TWICE DAILY 60 tablet 6   ferrous sulfate 325 (65 FE) MG tablet Take 325 mg by mouth daily with breakfast.     finasteride (PROSCAR) 5 MG tablet Take 5 mg by mouth daily.     gabapentin (NEURONTIN) 300 MG capsule Take 300 mg by mouth at bedtime.     losartan (COZAAR) 25 MG tablet TAKE 1 TABLET BY MOUTH DAILY 90 tablet 0   metoprolol succinate (TOPROL XL) 25 MG 24 hr tablet Take 1 tablet (25 mg total) by mouth in the morning and at bedtime. 180 tablet 3   Multiple Vitamin (MULTIVITAMIN) tablet Take 1 tablet by mouth daily.     Multiple Vitamins-Minerals (PRESERVISION AREDS 2) CAPS Take 1 capsule by mouth 2 (two) times daily.     nitroGLYCERIN (NITROSTAT) 0.4 MG SL tablet place 1 tablet under the tongue if needed every 5 minutes fo (Patient taking differently: Place 0.4 mg under the tongue every 5 (five) minutes as needed for chest pain.) 25 tablet 3   pantoprazole (PROTONIX) 40 MG tablet Take 1 tablet (40 mg total) by mouth at  bedtime. 30 tablet 5   No current facility-administered medications for this visit.    Allergies:   Prednisone and Terazosin    Social History:  The patient  reports that he has never smoked. He has never used smokeless tobacco. He reports current alcohol use of about 1.0 standard drink of alcohol per week. He reports that he does not use drugs.   Family History:  The patient's family history includes Heart attack in his mother; Hypertension in his mother; Stroke in his mother and another family member.    ROS:  Please see the history of present illness.   Otherwise, review of systems are positive for none.   All other systems are reviewed and negative.    PHYSICAL EXAM: VS:  BP 120/70 (BP Location: Left Arm, Patient Position: Sitting, Cuff Size: Normal)   Pulse 62   Ht '5\' 10"'$  (1.778 m)   Wt 205 lb 2 oz (93 kg)   SpO2 98%   BMI 29.43 kg/m  , BMI Body mass index is 29.43 kg/m. GEN: Well nourished, well developed, in no acute distress  HEENT: normal  Neck: no JVD, carotid bruits, or masses Cardiac: RRR; no  rubs, or gallops,no edema .  1 /6 systolic murmur at the aortic area Respiratory:  clear to auscultation bilaterally, normal work of breathing GI: soft, nontender, nondistended, + BS MS: no deformity or atrophy  Skin: warm and dry, no rash Neuro:  Strength and sensation are intact Psych: euthymic mood, full affect   EKG:  EKG is ordered today. The ekg ordered today demonstrates atrial paced rhythm with low voltage and left axis deviation.  Inferior infarct not different from before.  Recent Labs: 01/21/2022: BUN 16; Creatinine, Ser 1.49; Hemoglobin 13.0; Platelets 236; Potassium 4.0; Sodium 140    Lipid Panel    Component Value Date/Time   CHOL 115 10/08/2012 0554   TRIG 75 10/08/2012 0554   HDL 43 10/08/2012 0554   VLDL 15 10/08/2012 0554   LDLCALC 57 10/08/2012 0554      Wt Readings from Last 3 Encounters:  02/03/22 205 lb 2 oz (93 kg)  01/21/22 208 lb (94.3  kg)  08/21/21 208 lb 4 oz (94.5 kg)        ASSESSMENT AND PLAN:  1.  Coronary artery disease involving native coronary arteries without angina: He denies chest pain but reports worsening exertional dyspnea.  We will obtain an echocardiogram.  2. Paroxysmal atrial fibrillation: He is currently in paced rhythm with no evidence of atrial fibrillation.  Continue Toprol and Eliquis for anticoagulation.  3. Essential hypertension: Blood pressure is controlled and he is not orthostatic today.  4. Hyperlipidemia: I reviewed most recent lipid profile done in August which showed an LDL of 69 which is at target of less than 70.  Continue current dose of atorvastatin.  5.  Dizziness and presyncope with increased shortness of breath: The exact etiology is not entirely clear.  He is not orthostatic today and his labs recently were unremarkable.  His pacemaker seems to be functioning properly on EKG.  I requested a 2 weeks ZIO monitor and will obtain an echocardiogram given worsening dyspnea.   Disposition:   FU in 1 month.  Signed,  Kathlyn Sacramento, MD  02/03/2022 1:41 PM    [place]

## 2022-02-03 NOTE — Patient Instructions (Signed)
Medication Instructions:  - Your physician recommends that you continue on your current medications as directed. Please refer to the Current Medication list given to you today.  *If you need a refill on your cardiac medications before your next appointment, please call your pharmacy*   Lab Work: - none ordered  If you have labs (blood work) drawn today and your tests are completely normal, you will receive your results only by: [place] (if you have MyChart) OR A paper copy in the mail If you have any lab test that is abnormal or we need to change your treatment, we will call you to review the results.   Testing/Procedures:  1) Echocardiogram: - Your physician has requested that you have an echocardiogram. Echocardiography is a painless test that uses sound waves to create images of your heart. It provides your doctor with information about the size and shape of your heart and how well your heart's chambers and valves are working. This procedure takes approximately one hour. There are no restrictions for this procedure. There is a possibility that an IV may need to be started during your test to inject an image enhancing agent. This is done to obtain more optimal pictures of your heart. Therefore we ask that you do at least drink some water prior to coming in to hydrate your veins.    2) Heart Monitor:  Length of Wear: 14 days  Your monitor will be mailed to your home address within 3-5 business days. However, if you have not received your monitor after 5 business days please send [place] a MyChart message or call the office at [phone], so we may follow up on this for you.   Your physician has recommended that you wear a Zio AT (real time heart) monitor.   This monitor is a medical device that records the heart's electrical activity. Doctors most often use these monitors to diagnose arrhythmias. Arrhythmias are problems with the speed or rhythm of the heartbeat. The monitor is a  small device applied to your chest. You can wear one while you do your normal daily activities. While wearing this monitor if you have any symptoms to push the button and record what you felt. Once you have worn this monitor for the period of time provider prescribed (Usually 14 days), you will return the monitor device in the postage paid box. Once it is returned they will download the data collected and provide [place] with a report which the provider will then review and we will call you with those results. Important tips:  Avoid showering during the first 24 hours of wearing the monitor. Avoid excessive sweating to help maximize wear time. Do not submerge the device, no hot tubs, and no swimming pools. Keep any lotions or oils away from the patch. After 24 hours you may shower with the patch on. Take brief showers with your back facing the shower head.  Do not remove patch once it has been placed because that will interrupt data and decrease adhesive wear time. Push the button when you have any symptoms and write down what you were feeling. Once you have completed wearing your monitor, remove and place into box which has postage paid and place in your outgoing mailbox.  If for some reason you have misplaced your box then call our office and we can provide another box and/or mail it off for you.       Follow-Up: At [facility], you and your health needs are our  priority.  As part of our continuing mission to provide you with exceptional heart care, we have created designated Provider Care Teams.  These Care Teams include your primary Cardiologist (physician) and Advanced Practice Providers (APPs -  Physician Assistants and Nurse Practitioners) who all work together to provide you with the care you need, when you need it.  We recommend signing up for the patient portal called "MyChart".  Sign up information is provided on this After Visit Summary.  MyChart is used to connect with patients for  Virtual Visits (Telemedicine).  Patients are able to view lab/test results, encounter notes, upcoming appointments, etc.  Non-urgent messages can be sent to your provider as well.   To learn more about what you can do with MyChart, go to [email or web address].    Your next appointment:   5 week(s)  The format for your next appointment:   In Person  Provider:   You may see Kathlyn Sacramento, MD or one of the following Advanced Practice Providers on your designated Care Team:   Murray Hodgkins, NP Christell Faith, PA-C Cadence Kathlen Mody, [place]    Other Instructions  Echocardiogram An echocardiogram is a test that uses sound waves (ultrasound) to produce images of the heart. Images from an echocardiogram can provide important information about: Heart size and shape. The size and thickness and movement of your heart's walls. Heart muscle function and strength. Heart valve function or if you have stenosis. Stenosis is when the heart valves are too narrow. If blood is flowing backward through the heart valves (regurgitation). A tumor or infectious growth around the heart valves. Areas of heart muscle that are not working well because of poor blood flow or injury from a heart attack. Aneurysm detection. An aneurysm is a weak or damaged part of an artery wall. The wall bulges out from the normal force of blood pumping through the body. Tell a health care provider about: Any allergies you have. All medicines you are taking, including vitamins, herbs, eye drops, creams, and over-the-counter medicines. Any blood disorders you have. Any surgeries you have had. Any medical conditions you have. Whether you are pregnant or may be pregnant. What are the risks? Generally, this is a safe test. However, problems may occur, including an allergic reaction to dye (contrast) that may be used during the test. What happens before the test? No specific preparation is needed. You may eat and drink normally. What  happens during the test?  You will take off your clothes from the waist up and put on a hospital gown. Electrodes or electrocardiogram (ECG)patches may be placed on your chest. The electrodes or patches are then connected to a device that monitors your heart rate and rhythm. You will lie down on a table for an ultrasound exam. A gel will be applied to your chest to help sound waves pass through your skin. A handheld device, called a transducer, will be pressed against your chest and moved over your heart. The transducer produces sound waves that travel to your heart and bounce back (or "echo" back) to the transducer. These sound waves will be captured in real-time and changed into images of your heart that can be viewed on a video monitor. The images will be recorded on a computer and reviewed by your health care provider. You may be asked to change positions or hold your breath for a short time. This makes it easier to get different views or better views of your heart. In some cases, you may receive  contrast through an IV in one of your veins. This can improve the quality of the pictures from your heart. The procedure may vary among health care providers and hospitals. What can I expect after the test? You may return to your normal, everyday life, including diet, activities, and medicines, unless your health care provider tells you not to do that. Follow these instructions at home: It is up to you to get the results of your test. Ask your health care provider, or the department that is doing the test, when your results will be ready. Keep all follow-up visits. This is important. Summary An echocardiogram is a test that uses sound waves (ultrasound) to produce images of the heart. Images from an echocardiogram can provide important information about the size and shape of your heart, heart muscle function, heart valve function, and other possible heart problems. You do not need to do anything to  prepare before this test. You may eat and drink normally. After the echocardiogram is completed, you may return to your normal, everyday life, unless your health care provider tells you not to do that. This information is not intended to replace advice given to you by your health care provider. Make sure you discuss any questions you have with your health care provider. Document Revised: 03/12/2021 Document Reviewed: 02/20/2020 Elsevier Patient Education  [place]

## 2022-02-04 ENCOUNTER — Ambulatory Visit (INDEPENDENT_AMBULATORY_CARE_PROVIDER_SITE_OTHER): Payer: PPO

## 2022-02-04 DIAGNOSIS — R0602 Shortness of breath: Secondary | ICD-10-CM | POA: Diagnosis not present

## 2022-02-04 DIAGNOSIS — I495 Sick sinus syndrome: Secondary | ICD-10-CM

## 2022-02-04 LAB — ECHOCARDIOGRAM COMPLETE
AR max vel: 3.26 cm2
AV Area VTI: 3.63 cm2
AV Area mean vel: 3.5 cm2
AV Mean grad: 4 mmHg
AV Peak grad: 7.2 mmHg
Ao pk vel: 1.34 m/s
Area-P 1/2: 2.29 cm2
Calc EF: 54.8 %
S' Lateral: 2.4 cm
Single Plane A2C EF: 53.3 %
Single Plane A4C EF: 56.3 %

## 2022-02-05 ENCOUNTER — Ambulatory Visit (INDEPENDENT_AMBULATORY_CARE_PROVIDER_SITE_OTHER): Payer: PPO

## 2022-02-05 ENCOUNTER — Telehealth: Payer: Self-pay

## 2022-02-05 ENCOUNTER — Telehealth: Payer: Self-pay | Admitting: Cardiology

## 2022-02-05 DIAGNOSIS — I495 Sick sinus syndrome: Secondary | ICD-10-CM | POA: Diagnosis not present

## 2022-02-05 LAB — CUP PACEART INCLINIC DEVICE CHECK
Battery Remaining Longevity: 163 mo
Battery Voltage: 3.12 V
Brady Statistic AP VP Percent: 0.04 %
Brady Statistic AP VS Percent: 77.73 %
Brady Statistic AS VP Percent: 0.01 %
Brady Statistic AS VS Percent: 22.22 %
Brady Statistic RA Percent Paced: 77.71 %
Brady Statistic RV Percent Paced: 0.05 %
Date Time Interrogation Session: 20230727141500
Implantable Lead Implant Date: 20221024
Implantable Lead Implant Date: 20221024
Implantable Lead Location: 753859
Implantable Lead Location: 753860
Implantable Lead Model: 5076
Implantable Lead Model: 5076
Implantable Pulse Generator Implant Date: 20221024
Lead Channel Impedance Value: 342 Ohm
Lead Channel Impedance Value: 399 Ohm
Lead Channel Impedance Value: 570 Ohm
Lead Channel Impedance Value: 760 Ohm
Lead Channel Pacing Threshold Amplitude: 0.75 V
Lead Channel Pacing Threshold Amplitude: 1 V
Lead Channel Pacing Threshold Pulse Width: 0.4 ms
Lead Channel Pacing Threshold Pulse Width: 0.4 ms
Lead Channel Sensing Intrinsic Amplitude: 1.125 mV
Lead Channel Sensing Intrinsic Amplitude: 8.125 mV
Lead Channel Setting Pacing Amplitude: 1.5 V
Lead Channel Setting Pacing Amplitude: 2 V
Lead Channel Setting Pacing Pulse Width: 0.4 ms
Lead Channel Setting Sensing Sensitivity: 0.9 mV

## 2022-02-05 NOTE — Telephone Encounter (Signed)
Successful telephone encounter to patient to discuss his complain of rapid HR and non-working remote monitor. Patient request device clinic appointment to assess for fast HR episodes that occurred this morning with lightheadedness. Patient is scheduled for in clinic check today at 2:00pm. Patient is advised to bring his remote monitor with him for troubleshooting. Patient agreeable.

## 2022-02-05 NOTE — Telephone Encounter (Signed)
Patient called in stating he has gotten a phone call from [place] saying his monitor is not connected and he states its not plugged in because he thought it was not working.. patient is not home he is at work and is not near the monitor so he cant be helped with the monitor issue until then. Patient states he wants to come in the office since he has been having rapid heart beats. I let patient know I will send a msg to the nurse for a call back to decide if he should come in the office. Patient is very concerned

## 2022-02-05 NOTE — Progress Notes (Signed)
Pacemaker check in clinic secondary to patient complaint of palpitations and inability to send a transmission. Remote monitor assistance provided by remote specialist and patient now has the ability to transmit.  Normal device function. Thresholds, sensing, impedances consistent with previous measurements. Device programmed to maximize longevity. 2 brief NSVT episodes appear to be atrial driven, longest lasting 2 seconds.  Pwaves measuring 1.47m previously measured at 0.539m + AMS.  No episodes noted that correlate with patient complaints.  Device programmed at appropriate safety margins. Histogram distribution appropriate for patient activity level. Device programmed to optimize intrinsic conduction. Estimated longevity 13 years. Patient enrolled in remote follow-up. Patient education completed.

## 2022-02-06 LAB — CUP PACEART REMOTE DEVICE CHECK
Battery Remaining Longevity: 163 mo
Battery Voltage: 3.12 V
Brady Statistic AP VP Percent: 0.04 %
Brady Statistic AP VS Percent: 77.74 %
Brady Statistic AS VP Percent: 0.01 %
Brady Statistic AS VS Percent: 22.2 %
Brady Statistic RA Percent Paced: 77.73 %
Brady Statistic RV Percent Paced: 0.06 %
Date Time Interrogation Session: 20230727144958
Implantable Lead Implant Date: 20221024
Implantable Lead Implant Date: 20221024
Implantable Lead Location: 753859
Implantable Lead Location: 753860
Implantable Lead Model: 5076
Implantable Lead Model: 5076
Implantable Pulse Generator Implant Date: 20221024
Lead Channel Impedance Value: 342 Ohm
Lead Channel Impedance Value: 399 Ohm
Lead Channel Impedance Value: 570 Ohm
Lead Channel Impedance Value: 760 Ohm
Lead Channel Pacing Threshold Amplitude: 0.625 V
Lead Channel Pacing Threshold Amplitude: 0.875 V
Lead Channel Pacing Threshold Pulse Width: 0.4 ms
Lead Channel Pacing Threshold Pulse Width: 0.4 ms
Lead Channel Sensing Intrinsic Amplitude: 0.5 mV
Lead Channel Sensing Intrinsic Amplitude: 1.125 mV
Lead Channel Sensing Intrinsic Amplitude: 8.125 mV
Lead Channel Sensing Intrinsic Amplitude: 8.125 mV
Lead Channel Setting Pacing Amplitude: 1.5 V
Lead Channel Setting Pacing Amplitude: 2 V
Lead Channel Setting Pacing Pulse Width: 0.4 ms
Lead Channel Setting Sensing Sensitivity: 0.9 mV

## 2022-02-09 ENCOUNTER — Other Ambulatory Visit: Payer: Self-pay | Admitting: Cardiovascular Disease

## 2022-02-18 ENCOUNTER — Other Ambulatory Visit: Payer: Self-pay | Admitting: Cardiovascular Disease

## 2022-02-20 DIAGNOSIS — R55 Syncope and collapse: Secondary | ICD-10-CM | POA: Diagnosis not present

## 2022-02-21 DIAGNOSIS — R55 Syncope and collapse: Secondary | ICD-10-CM | POA: Diagnosis not present

## 2022-03-02 NOTE — Progress Notes (Signed)
Remote pacemaker transmission.   

## 2022-03-12 ENCOUNTER — Ambulatory Visit: Payer: PPO | Admitting: Cardiovascular Disease

## 2022-03-17 ENCOUNTER — Encounter: Payer: Self-pay | Admitting: Physician Assistant

## 2022-03-17 ENCOUNTER — Telehealth: Payer: Self-pay | Admitting: Cardiovascular Disease

## 2022-03-17 ENCOUNTER — Ambulatory Visit: Payer: PPO | Attending: Physician Assistant | Admitting: Physician Assistant

## 2022-03-17 VITALS — BP 118/76 | HR 75 | Ht 70.0 in | Wt 206.0 lb

## 2022-03-17 DIAGNOSIS — I10 Essential (primary) hypertension: Secondary | ICD-10-CM | POA: Diagnosis not present

## 2022-03-17 DIAGNOSIS — I480 Paroxysmal atrial fibrillation: Secondary | ICD-10-CM | POA: Diagnosis not present

## 2022-03-17 DIAGNOSIS — I2510 Atherosclerotic heart disease of native coronary artery without angina pectoris: Secondary | ICD-10-CM

## 2022-03-17 DIAGNOSIS — I495 Sick sinus syndrome: Secondary | ICD-10-CM

## 2022-03-17 DIAGNOSIS — R55 Syncope and collapse: Secondary | ICD-10-CM | POA: Diagnosis not present

## 2022-03-17 DIAGNOSIS — E785 Hyperlipidemia, unspecified: Secondary | ICD-10-CM

## 2022-03-17 MED ORDER — DILTIAZEM HCL 30 MG PO TABS: ORAL_TABLET | ORAL | 3 refills | Status: DC

## 2022-03-17 NOTE — Telephone Encounter (Signed)
Danielle from [facility] called to report an event as follows:  Episode of Afib on 02/26/22 at 4:06 pm lasted 1 minute and 56 sec  HR 85-12 bpm.  Patient has a history of Paroxysmal A-Fib and is on Eliquis.

## 2022-03-17 NOTE — Patient Instructions (Signed)
Medication Instructions:  Your physician recommends that you continue on your current medications as directed. Please refer to the Current Medication list given to you today.  *If you need a refill on your cardiac medications before your next appointment, please call your pharmacy*   Lab Work: None  If you have labs (blood work) drawn today and your tests are completely normal, you will receive your results only by: [place] (if you have MyChart) OR A paper copy in the mail If you have any lab test that is abnormal or we need to change your treatment, we will call you to review the results.   Testing/Procedures: None   Follow-Up: At [facility], you and your health needs are our priority.  As part of our continuing mission to provide you with exceptional heart care, we have created designated Provider Care Teams.  These Care Teams include your primary Cardiologist (physician) and Advanced Practice Providers (APPs -  Physician Assistants and Nurse Practitioners) who all work together to provide you with the care you need, when you need it.   Your next appointment:   3 month(s)  The format for your next appointment:   In Person  Provider:   Kathlyn Sacramento, MD or Christell Faith, PA-C       Important Information About [place]

## 2022-03-17 NOTE — Progress Notes (Signed)
Cardiology Office Note    Date:  03/17/2022   ID:  Benjamin Brewer, DOB 1931/06/01, MRN [id number]  PCP:  Derinda Late, MD  Cardiologist:  Kathlyn Sacramento, MD  Electrophysiologist:  Vickie Epley, MD   Chief Complaint: Follow-up  History of Present Illness:   Benjamin Brewer is a 88 y.o. male with history of CAD status post PCI/DES to the LAD and RCA in 2009, PAF on apixaban, tachybradycardia syndrome status post PPM, renal dysfunction, HTN, HLD, and cervical spine disease who presents for follow-up of echo.  He underwent PCI/DES to the LAD and RCA in 2009.  He was diagnosed with A-fib in 2015 and has been on anticoagulation since.  Echo in 01/2018 demonstrated an EF of 60 to 65%, calcified aortic valve without significant stenosis, mildly dilated left atrium, and no significant pulmonary hypertension.  He was diagnosed with COVID in 11/2020 with noted severe intermittent diarrhea since.  In that setting, he had worsening palpitations with episodes of dizziness with a sense of a pause in his heartbeat.  Subsequent Zio patch showed sinus rhythm with an average rate of 70 bpm.  There was 1 run of NSVT lasting 17 beats, 18 episodes of SVT with the longest lasting 15 seconds, and one 4.1-second pause.  He continued to have intermittent palpitations as well as presyncope and was evaluated by EP for tachybradycardia syndrome.  He subsequently underwent PPM implantation in 04/2021.  He was seen in the ED in 01/2022 with dizziness and presyncope.  BP 149/62 with a heart rate of 67 bpm.  Pacemaker interrogation was unremarkable.  CT head showed no acute abnormalities.  Labs showed a slight elevation in his serum creatinine at 1.49.  He was last seen in the office for follow-up of the above ED visit and was without symptoms of chest pain, though did report worsening exertional dyspnea since his ED visit.  He continued to feel off balance and dizzy.  He also reported feeling that his heart stopped for a few  seconds recently.  He was not orthostatic.  Zio patch was recommended and remains pending at this time.  Echo on 02/04/2022 demonstrated an EF of 60 to 65%, no regional wall motion abnormalities, moderate LVH, grade 1 diastolic dysfunction, normal RV systolic function and ventricular cavity size and RVSP, mildly dilated left atrium, mildly to moderately dilated right atrium, trivial mitral regurgitation, aortic valve sclerosis without evidence of stenosis, and a mildly dilated aortic root measuring 42 mm.  PPM interrogation on 02/05/2022 showed 2 brief episodes of NSVT with the longest lasting 2 seconds.  He comes in doing well from a cardiac perspective and is without symptoms of angina or decompensation.  He has not had any further dizziness episodes since turning in his Zio patch, which remains pending at this time.  No chest pain or dyspnea.  No frank syncope.  No palpitations.  He has not needed any as needed diltiazem.  No falls or symptoms concerning for bleeding.  No lower extremity swelling.   Labs independently reviewed: 01/2022 - Hgb 13.0, PLT 236, potassium 4.0, BUN 16, serum creatinine 1.49 09/2021 - albumin 4.3, AST/ALT normal, TC 150, TG 153, HDL 37, LDL 82 05/2021 - magnesium 2.1 12/2018 - TSH normal  Past Medical History:  Diagnosis Date   A-fib ([place])    Arthritis    hands   Benign prostatic hypertrophy    Hx of    CAD (coronary artery disease)    native vessel. Status  post LAD and RCA angioplasty and drug-eluting stent placement in 2009   Dental bridge present    implants, no bridge   Dizziness    Dyslipidemia    GERD (gastroesophageal reflux disease)    History of tachycardia    Hyperlipidemia    Hypertension    Intermediate coronary syndrome (HCC)    angina, unstable   Numbness and tingling of both lower extremities    Other dyspnea and respiratory abnormality    on exertion   Pinched nerve in neck    some discomfort with side to side movement   Vertigo    approx  1x/month    Past Surgical History:  Procedure Laterality Date   [place]; x2 (last stent 2009)   [place] W/PHACO Left 07/03/2019   Procedure: CATARACT EXTRACTION PHACO AND INTRAOCULAR LENS PLACEMENT ([place]) LEFT 10.19  01:06.9;  Surgeon: Eulogio Bear, MD;  Location: [facility];  Service: Ophthalmology;  Laterality: Left;   ESOPHAGOGASTRODUODENOSCOPY (EGD) WITH PROPOFOL N/A 06/10/2015   Procedure: ESOPHAGOGASTRODUODENOSCOPY (EGD) WITH PROPOFOL;  Surgeon: Manya Silvas, MD;  Location: [facility] ENDOSCOPY;  Service: Endoscopy;  Laterality: N/A;   HERNIA REPAIR     PACEMAKER IMPLANT N/A 05/05/2021   Procedure: PACEMAKER IMPLANT;  Surgeon: Vickie Epley, MD;  Location: [facility];  Service: Cardiovascular;  Laterality: N/A;   SAVORY DILATION N/A 06/10/2015   Procedure: SAVORY DILATION;  Surgeon: Manya Silvas, MD;  Location: [facility] ENDOSCOPY;  Service: Endoscopy;  Laterality: N/A;   TONSILLECTOMY      Current Medications: Current Meds  Medication Sig   atorvastatin (LIPITOR) 40 MG tablet TAKE 1 TABLET BY MOUTH EVERY MORNING   Bisacodyl (LAXATIVE PO) Take 1 tablet by mouth daily as needed (constipation).   Carboxymethylcellul-Glycerin (LUBRICATING EYE DROPS OP) Place 1 drop into both eyes daily as needed (dry eyes).   Cholecalciferol (VITAMIN D3 PO) Take 1 capsule by mouth daily.   cyanocobalamin 500 MCG tablet Take 500 mcg by mouth 2 (two) times daily.   ELIQUIS 5 MG TABS tablet TAKE 1 TABLET BY MOUTH TWICE DAILY   ferrous sulfate 325 (65 FE) MG tablet Take 325 mg by mouth daily with breakfast.   finasteride (PROSCAR) 5 MG tablet Take 5 mg by mouth daily.   gabapentin (NEURONTIN) 300 MG capsule Take 300 mg by mouth at bedtime.   losartan (COZAAR) 25 MG tablet TAKE 1 TABLET BY MOUTH DAILY   metoprolol succinate (TOPROL XL) 25 MG 24 hr tablet  Take 1 tablet (25 mg total) by mouth in the morning and at bedtime.   Multiple Vitamin (MULTIVITAMIN) tablet Take 1 tablet by mouth daily.   Multiple Vitamins-Minerals (PRESERVISION AREDS 2) CAPS Take 1 capsule by mouth 2 (two) times daily.   nitroGLYCERIN (NITROSTAT) 0.4 MG SL tablet Place 1 nitroglycerin under your tongue, while sitting. If no relief of pain, may repeat 1 tablet every 5 minutes up to 3 tablets over 15 minutes.   pantoprazole (PROTONIX) 40 MG tablet Take 1 tablet (40 mg total) by mouth at bedtime.   [DISCONTINUED] diltiazem (CARDIZEM) 30 MG tablet TAKE ONE TABLET EVERY FOUR HOURS AS NEEDED FOR AFIB HEART RATE >100    Allergies:   Prednisone and Terazosin   Social History   Socioeconomic History   Marital status: Widowed    Spouse name: Not on  file   Number of children: 1   Years of education: Not on file   Highest education level: Bachelor's degree (e.g., BA, AB, BS)  Occupational History   Not on file  Tobacco Use   Smoking status: Never   Smokeless tobacco: Never  Vaping Use   Vaping Use: Never used  Substance and Sexual Activity   Alcohol use: Yes    Alcohol/week: 1.0 standard drink of alcohol    Types: 1 Glasses of wine per week    Comment: occasional    Drug use: No   Sexual activity: Not on file  Other Topics Concern   Not on file  Social History Narrative   Widowed retired 12/95. Gets regular exercise.       Lives at home alone   Right handed   Social Determinants of Health   Financial Resource Strain: Not on file  Food Insecurity: Not on file  Transportation Needs: Not on file  Physical Activity: Not on file  Stress: Not on file  Social Connections: Not on file     Family History:  The patient's family history includes Heart attack in his mother; Hypertension in his mother; Stroke in his mother and another family member.  ROS:   12-point review of systems is negative unless otherwise noted in the HPI.   EKGs/Labs/Other Studies  Reviewed:    Studies reviewed were summarized above. The additional studies were reviewed today:  Echo 02/04/2022: 1. Left ventricular ejection fraction, by estimation, is 60 to 65%. The  left ventricle has normal function. The left ventricle has no regional  wall motion abnormalities. There is moderate left ventricular hypertrophy.  Left ventricular diastolic  parameters are consistent with Grade I diastolic dysfunction (impaired  relaxation).   2. Right ventricular systolic function is normal. The right ventricular  size is normal. There is normal pulmonary artery systolic pressure.   3. Left atrial size was mildly dilated.   4. Right atrial size was mild to moderately dilated.   5. The mitral valve is grossly normal. Trivial mitral valve  regurgitation. No evidence of mitral stenosis.   6. The aortic valve is tricuspid. There is mild thickening of the aortic  valve. Aortic valve regurgitation is not visualized. Aortic valve  sclerosis is present, with no evidence of aortic valve stenosis.   7. Aortic dilatation noted. There is mild dilatation of the aortic root,  measuring 42 mm. __________  Limited echo 04/25/2021: 1. Left ventricular ejection fraction, by estimation, is 60 to 65%. The  left ventricle has normal function. The left ventricle has no regional  wall motion abnormalities. There is mild left ventricular hypertrophy.  Left ventricular diastolic parameters  are consistent with Grade I diastolic dysfunction (impaired relaxation).  The average left ventricular global longitudinal strain is -17.0 %. The  global longitudinal strain is normal.   2. Right ventricular systolic function is normal. The right ventricular  size is normal. Tricuspid regurgitation signal is inadequate for assessing  PA pressure.   3. The mitral valve is normal in structure. Mild to moderate mitral valve  regurgitation. No evidence of mitral stenosis.   4. The aortic valve was not well visualized.  Aortic valve regurgitation  is not visualized. Mild to moderate aortic valve sclerosis/calcification  is present, without any evidence of aortic stenosis.   5. The inferior vena cava is normal in size with greater than 50%  respiratory variability, suggesting right atrial pressure of 3 mmHg. __________  2D echo 05/09/2020: 1. Left  ventricular ejection fraction, by estimation, is 55 to 60%. The  left ventricle has normal function. The left ventricle has no regional  wall motion abnormalities. Left ventricular diastolic parameters are  consistent with Grade I diastolic  dysfunction (impaired relaxation). The average left ventricular global  longitudinal strain is -4.8 %.   2. Right ventricular systolic function is normal. The right ventricular  size is normal. There is normal pulmonary artery systolic pressure. The  estimated right ventricular systolic pressure is 37.1 mmHg.   3. Left atrial size was mildly dilated.   4. Mild to moderate mitral valve regurgitation.   5. The aortic valve is moderately calcified. Aortic valve regurgitation  is not visualized. Mild to moderate aortic valve sclerosis/calcification  is present, without any evidence of aortic stenosis.   6. There is borderline dilatation of the aortic root, measuring 38 mm. __________  Benjamin Brewer 01/2021: Patient had a min HR of 40 bpm, max HR of 182 bpm, and avg HR of 70 bpm.  Predominant underlying rhythm was Sinus Rhythm. 1 run of Ventricular Tachycardia occurred lasting 17 beats with a max rate of 154 bpm (avg 136 bpm).  88 Supraventricular Tachycardia runs occurred, the run with the fastest interval lasting 5 beats with a max rate of 182 bpm, the longest lasting 14.7 secs with an avg rate of 103 bpm. 1 Pause occurred lasting 4.1 secs (15 bpm). True duration of Pause difficult to ascertain due to artifact.  Occasional PACs and rare PVCs. __________  2D echo 01/2018: - Left ventricle: The cavity size was normal. There was mild    concentric hypertrophy. Systolic function was normal. The   estimated ejection fraction was in the range of 60% to 65%. Wall   motion was normal; there were no regional wall motion   abnormalities. Doppler parameters are consistent with abnormal   left ventricular relaxation (grade 1 diastolic dysfunction). - Aortic valve: Trileaflet; moderately thickened, moderately   calcified leaflets, predominantly non-coronary leaflet. Valve   mobility was restricted. Sclerosis without stenosis. There was no   regurgitation. - Aortic root: The aortic root was normal in size. - Mitral valve: There was trivial regurgitation. - Left atrium: The atrium was mildly dilated. - Right ventricle: The cavity size was normal. Wall thickness was   normal. Systolic function was normal. - Right atrium: The atrium was normal in size. - Tricuspid valve: There was trivial regurgitation. - Pulmonic valve: There was trivial regurgitation. - Pulmonary arteries: Systolic pressure was within the normal   range. - Inferior vena cava: The vessel was normal in size. - Pericardium, extracardiac: There was no pericardial effusion. __________   48-hour Holter 01/2018: Normal sinus rhythm with average heart rate of 65 bpm.  Minimal heart rate of 43 bpm with the longest pause of 1.64 seconds Rare PACs and PVCs. Short runs of SVT with the longest run of 14 beats with a heart rate of 142 bpm Some of the patient's reported events, correlated with PACs and PVCs.   EKG:  EKG is ordered today.  The EKG ordered today demonstrates a paced rhythm with prolonged AV conduction  Recent Labs: 01/21/2022: BUN 16; Creatinine, Ser 1.49; Hemoglobin 13.0; Platelets 236; Potassium 4.0; Sodium 140  Recent Lipid Panel    Component Value Date/Time   CHOL 115 10/08/2012 0554   TRIG 75 10/08/2012 0554   HDL 43 10/08/2012 0554   VLDL 15 10/08/2012 0554   LDLCALC 57 10/08/2012 0554    PHYSICAL EXAM:    VS:  BP 118/76 (BP Location: Left Arm,  Patient Position: Sitting, Cuff Size: Large)   Pulse 75   Ht '5\' 10"'$  (1.778 m)   Wt 206 lb (93.4 kg)   SpO2 97%   BMI 29.56 kg/m   BMI: Body mass index is 29.56 kg/m.  Physical Exam Vitals reviewed.  Constitutional:      Appearance: He is well-developed.  HENT:     Head: Normocephalic and atraumatic.  Eyes:     General:        Right eye: No discharge.        Left eye: No discharge.  Neck:     Vascular: No JVD.  Cardiovascular:     Rate and Rhythm: Normal rate and regular rhythm.     Pulses:          Posterior tibial pulses are 2+ on the right side and 2+ on the left side.     Heart sounds: S1 normal and S2 normal. Heart sounds not distant. No midsystolic click and no opening snap. Murmur heard.     Systolic murmur is present with a grade of 1/6 at the upper right sternal border.     No friction rub.  Pulmonary:     Effort: Pulmonary effort is normal. No respiratory distress.     Breath sounds: Normal breath sounds. No decreased breath sounds, wheezing or rales.  Chest:     Chest wall: No tenderness.  Abdominal:     General: There is no distension.  Musculoskeletal:     Cervical back: Normal range of motion.     Right lower leg: No edema.     Left lower leg: No edema.  Skin:    General: Skin is warm and dry.     Nails: There is no clubbing.  Neurological:     Mental Status: He is alert and oriented to person, place, and time.  Psychiatric:        Speech: Speech normal.        Behavior: Behavior normal.        Thought Content: Thought content normal.        Judgment: Judgment normal.     Wt Readings from Last 3 Encounters:  03/17/22 206 lb (93.4 kg)  02/03/22 205 lb 2 oz (93 kg)  01/21/22 208 lb (94.3 kg)     ASSESSMENT & PLAN:   CAD involving the native coronary arteries without angina: He is doing well without symptoms concerning for angina.  He remains on apixaban in place of aspirin to minimize bleeding risk, in the context of underlying A-fib.  Continue  aggressive risk factor modification including atorvastatin, losartan, metoprolol, and as needed SL NTG.  PAF: Maintaining sinus rhythm within a paced rhythm in the office today.  No evidence of A-fib on most recent pacemaker interrogation.  He remains on metoprolol and apixaban without symptoms concerning for bleeding with stable labs as outlined above.  Tachybradycardia syndrome: Pacemaker functioning normally on most recent device interrogation and on EKG today.  Follow-up with EP as directed.  HTN: Blood pressure is well controlled.  Continue current medical therapy.  HLD: LDL 69 in 02/2022.  He remains on atorvastatin.  Dizziness with presyncope: Of uncertain etiology.  Cardiac work-up thus far has been unrevealing.  No further issues.  Await Zio patch.   Disposition: F/u with Dr. Fletcher Anon or an APP in 3 months.   Medication Adjustments/Labs and Tests Ordered: Current medicines are reviewed at length with the patient today.  Concerns  regarding medicines are outlined above. Medication changes, Labs and Tests ordered today are summarized above and listed in the Patient Instructions accessible in Encounters.   Signed, Christell Faith, PA-C 03/17/2022 2:41 PM     [address] [place], [place] [address] [phone]

## 2022-03-17 NOTE — Telephone Encounter (Signed)
See office visit from today.  Await final report.

## 2022-03-17 NOTE — Telephone Encounter (Signed)
Danielle with [facility] is calling to speak with a nurse to discuss abnormal heart monitor results.

## 2022-03-20 DIAGNOSIS — J300 Vasomotor rhinitis: Secondary | ICD-10-CM | POA: Diagnosis not present

## 2022-03-23 ENCOUNTER — Other Ambulatory Visit: Payer: Self-pay | Admitting: Cardiovascular Disease

## 2022-04-02 ENCOUNTER — Other Ambulatory Visit: Payer: Self-pay | Admitting: Cardiovascular Disease

## 2022-04-29 ENCOUNTER — Encounter: Payer: Self-pay | Admitting: Cardiology

## 2022-04-29 ENCOUNTER — Ambulatory Visit: Payer: PPO | Attending: Cardiology | Admitting: Cardiology

## 2022-04-29 VITALS — BP 114/66 | HR 88 | Ht 70.0 in | Wt 211.0 lb

## 2022-04-29 DIAGNOSIS — I10 Essential (primary) hypertension: Secondary | ICD-10-CM

## 2022-04-29 DIAGNOSIS — Z950 Presence of cardiac pacemaker: Secondary | ICD-10-CM | POA: Diagnosis not present

## 2022-04-29 DIAGNOSIS — I480 Paroxysmal atrial fibrillation: Secondary | ICD-10-CM | POA: Diagnosis not present

## 2022-04-29 DIAGNOSIS — I495 Sick sinus syndrome: Secondary | ICD-10-CM

## 2022-04-29 LAB — CUP PACEART INCLINIC DEVICE CHECK
Battery Remaining Longevity: 159 mo
Battery Voltage: 3.07 V
Brady Statistic AP VP Percent: 0.05 %
Brady Statistic AP VS Percent: 66.94 %
Brady Statistic AS VP Percent: 0.02 %
Brady Statistic AS VS Percent: 32.98 %
Brady Statistic RA Percent Paced: 67.03 %
Brady Statistic RV Percent Paced: 0.08 %
Date Time Interrogation Session: 20231018160400
Implantable Lead Implant Date: 20221024
Implantable Lead Implant Date: 20221024
Implantable Lead Location: 753859
Implantable Lead Location: 753860
Implantable Lead Model: 5076
Implantable Lead Model: 5076
Implantable Pulse Generator Implant Date: 20221024
Lead Channel Impedance Value: 342 Ohm
Lead Channel Impedance Value: 399 Ohm
Lead Channel Impedance Value: 456 Ohm
Lead Channel Impedance Value: 589 Ohm
Lead Channel Pacing Threshold Amplitude: 0.5 V
Lead Channel Pacing Threshold Amplitude: 0.5 V
Lead Channel Pacing Threshold Amplitude: 0.625 V
Lead Channel Pacing Threshold Amplitude: 0.875 V
Lead Channel Pacing Threshold Pulse Width: 0.4 ms
Lead Channel Pacing Threshold Pulse Width: 0.4 ms
Lead Channel Pacing Threshold Pulse Width: 0.4 ms
Lead Channel Pacing Threshold Pulse Width: 0.4 ms
Lead Channel Sensing Intrinsic Amplitude: 0.75 mV
Lead Channel Sensing Intrinsic Amplitude: 1.125 mV
Lead Channel Sensing Intrinsic Amplitude: 8.25 mV
Lead Channel Sensing Intrinsic Amplitude: 8.625 mV
Lead Channel Setting Pacing Amplitude: 1.75 V
Lead Channel Setting Pacing Amplitude: 2 V
Lead Channel Setting Pacing Pulse Width: 0.4 ms
Lead Channel Setting Sensing Sensitivity: 0.9 mV

## 2022-04-29 LAB — PACEMAKER DEVICE OBSERVATION

## 2022-04-29 MED ORDER — METOPROLOL SUCCINATE ER 50 MG PO TB24: 50.0000 mg | ORAL_TABLET | Freq: Two times a day (BID) | ORAL | 3 refills | Status: DC

## 2022-04-29 NOTE — Patient Instructions (Signed)
Medication Instructions:  Increase Metoprolol succinate to 50 mg two times daily.  *If you need a refill on your cardiac medications before your next appointment, please call your pharmacy*   Lab Work: None  If you have labs (blood work) drawn today and your tests are completely normal, you will receive your results only by: [facility] (if you have MyChart) OR A paper copy in the mail If you have any lab test that is abnormal or we need to change your treatment, we will call you to review the results.   Testing/Procedures: None    Follow-Up: At [facility], you and your health needs are our priority.  As part of our continuing mission to provide you with exceptional heart care, we have created designated Provider Care Teams.  These Care Teams include your primary Cardiologist (physician) and Advanced Practice Providers (APPs -  Physician Assistants and Nurse Practitioners) who all work together to provide you with the care you need, when you need it.  We recommend signing up for the patient portal called "MyChart".  Sign up information is provided on this After Visit Summary.  MyChart is used to connect with patients for Virtual Visits (Telemedicine).  Patients are able to view lab/test results, encounter notes, upcoming appointments, etc.  Non-urgent messages can be sent to your provider as well.   To learn more about what you can do with MyChart, go to [email or web address].    Your next appointment:   1 year(s)  The format for your next appointment:   In Person  Provider:   You will see one of the following Advanced Practice Providers on your designated Care Team:   Murray Hodgkins, NP Christell Faith, PA-C Cadence Kathlen Mody, PA-C Gerrie Nordmann, NP      Other Instructions None   Important Information About Sugar

## 2022-04-29 NOTE — Progress Notes (Signed)
Electrophysiology Office Follow up Visit Note:    Date:  04/29/2022   ID:  Benjamin Brewer, DOB 1931-05-28, MRN [id number]  PCP:  Derinda Late, MD  [facility] Cardiologist:  Kathlyn Sacramento, MD  [facility] Electrophysiologist:  Vickie Epley, MD    Interval History:    Benjamin Brewer is a 88 y.o. male who presents for a follow up visit. They were last seen in clinic August 06, 2021 after his pacemaker was implanted May 05, 2021 for tachycardia-bradycardia syndrome. Remote interrogations have shown stable device function.  He has experienced several high ventricular rate episodes that appeared to be nonsustained SVT.  Today he is doing well.  He does intermittently feel palpitations that last for seconds at a time.  No clear trigger.    Past Medical History:  Diagnosis Date   A-fib [facility])    Arthritis    hands   Benign prostatic hypertrophy    Hx of    CAD (coronary artery disease)    native vessel. Status post LAD and RCA angioplasty and drug-eluting stent placement in 2009   Dental bridge present    implants, no bridge   Dizziness    Dyslipidemia    GERD (gastroesophageal reflux disease)    History of tachycardia    Hyperlipidemia    Hypertension    Intermediate coronary syndrome (HCC)    angina, unstable   Numbness and tingling of both lower extremities    Other dyspnea and respiratory abnormality    on exertion   Pinched nerve in neck    some discomfort with side to side movement   Vertigo    approx 1x/month    Past Surgical History:  Procedure Laterality Date   [place]; x2 (last stent 2009)   [place] W/PHACO Left 07/03/2019   Procedure: CATARACT EXTRACTION PHACO AND INTRAOCULAR LENS PLACEMENT ([place]) LEFT 10.19  01:06.9;  Surgeon: Eulogio Bear, MD;  Location: [facility];  Service: Ophthalmology;   Laterality: Left;   ESOPHAGOGASTRODUODENOSCOPY (EGD) WITH PROPOFOL N/A 06/10/2015   Procedure: ESOPHAGOGASTRODUODENOSCOPY (EGD) WITH PROPOFOL;  Surgeon: Manya Silvas, MD;  Location: [facility] ENDOSCOPY;  Service: Endoscopy;  Laterality: N/A;   HERNIA REPAIR     PACEMAKER IMPLANT N/A 05/05/2021   Procedure: PACEMAKER IMPLANT;  Surgeon: Vickie Epley, MD;  Location: [place] CV LAB;  Service: Cardiovascular;  Laterality: N/A;   SAVORY DILATION N/A 06/10/2015   Procedure: SAVORY DILATION;  Surgeon: Manya Silvas, MD;  Location: [facility] ENDOSCOPY;  Service: Endoscopy;  Laterality: N/A;   TONSILLECTOMY      Current Medications: Current Meds  Medication Sig   atorvastatin (LIPITOR) 40 MG tablet TAKE 1 TABLET BY MOUTH EVERY MORNING   Carboxymethylcellul-Glycerin (LUBRICATING EYE DROPS OP) Place 1 drop into both eyes daily as needed (dry eyes).   Cholecalciferol (VITAMIN D3 PO) Take 1 capsule by mouth daily.   cyanocobalamin 500 MCG tablet Take 500 mcg by mouth 2 (two) times daily.   diltiazem (CARDIZEM) 30 MG tablet TAKE ONE TABLET EVERY FOUR HOURS AS NEEDED FOR AFIB HEART RATE >100   ELIQUIS 5 MG TABS tablet TAKE 1 TABLET BY MOUTH TWICE DAILY   ferrous sulfate 325 (65 FE) MG tablet Take 325 mg by mouth daily with breakfast.   Fiber-Vitamins-Minerals ([employer] FIBER GUMMIES) CHEW Chew by mouth.   finasteride (  PROSCAR) 5 MG tablet Take 5 mg by mouth daily.   gabapentin (NEURONTIN) 300 MG capsule Take 300 mg by mouth at bedtime.   losartan (COZAAR) 25 MG tablet TAKE 1 TABLET BY MOUTH DAILY   metoprolol succinate (TOPROL-XL) 50 MG 24 hr tablet Take 1 tablet (50 mg total) by mouth 2 (two) times daily. Take with or immediately following a meal.   Multiple Vitamin (MULTIVITAMIN) tablet Take 1 tablet by mouth daily.   Multiple Vitamins-Minerals (PRESERVISION AREDS 2) CAPS Take 1 capsule by mouth 2 (two) times daily.   nitroGLYCERIN (NITROSTAT) 0.4 MG SL tablet Place 1 nitroglycerin under your tongue,  while sitting. If no relief of pain, may repeat 1 tablet every 5 minutes up to 3 tablets over 15 minutes.   pantoprazole (PROTONIX) 40 MG tablet TAKE 1 TABLET BY MOUTH NIGHTLY   [DISCONTINUED] metoprolol succinate (TOPROL XL) 25 MG 24 hr tablet Take 1 tablet (25 mg total) by mouth in the morning and at bedtime.     Allergies:   Prednisone and Terazosin   Social History   Socioeconomic History   Marital status: Widowed    Spouse name: Not on file   Number of children: 1   Years of education: Not on file   Highest education level: Bachelor's degree (e.g., BA, AB, BS)  Occupational History   Not on file  Tobacco Use   Smoking status: Never   Smokeless tobacco: Never  Vaping Use   Vaping Use: Never used  Substance and Sexual Activity   Alcohol use: Yes    Alcohol/week: 1.0 standard drink of alcohol    Types: 1 Glasses of wine per week    Comment: occasional    Drug use: No   Sexual activity: Not on file  Other Topics Concern   Not on file  Social History Narrative   Widowed retired 12/95. Gets regular exercise.       Lives at home alone   Right handed   Social Determinants of Health   Financial Resource Strain: Not on file  Food Insecurity: Not on file  Transportation Needs: Not on file  Physical Activity: Not on file  Stress: Not on file  Social Connections: Not on file     Family History: The patient's family history includes Heart attack in his mother; Hypertension in his mother; Stroke in his mother and another family member.  ROS:   Please see the history of present illness.    All other systems reviewed and are negative.  EKGs/Labs/Other Studies Reviewed:    The following studies were reviewed today:  March 19, 2022 ZIO monitor personally reviewed Heart rate 57-2 26, average 70 74 SVT episodes, longest 30 seconds at a rate of 113 bpm Less than 1% A-fib burden SVT episodes corresponded to symptom triggered episodes Rare PVCs Review of EGM's of SVT  suggests atrial tachycardia as a mechanism   April 29, 2022 in clinic device interrogation personally reviewed Battery longevity 13.2 years Lead parameters are stable Atrial pacing 67% Ventricular pacing less than 0.1% Presenting rhythm a sensed V sensed Episodes of high ventricular rates reviewed and appear to be consistent with supraventricular tachycardia with one-to-one AV conduction    Recent Labs: 01/21/2022: BUN 16; Creatinine, Ser 1.49; Hemoglobin 13.0; Platelets 236; Potassium 4.0; Sodium 140  Recent Lipid Panel    Component Value Date/Time   CHOL 115 10/08/2012 0554   TRIG 75 10/08/2012 0554   HDL 43 10/08/2012 0554   VLDL 15 10/08/2012 0554  LDLCALC 57 10/08/2012 0554    Physical Exam:    VS:  BP 114/66   Pulse 88   Ht '5\' 10"'$  (1.778 m)   Wt 211 lb (95.7 kg)   SpO2 98%   BMI 30.28 kg/m     Wt Readings from Last 3 Encounters:  04/29/22 211 lb (95.7 kg)  03/17/22 206 lb (93.4 kg)  02/03/22 205 lb 2 oz (93 kg)     GEN:  Well nourished, well developed in no acute distress HEENT: Normal NECK: No JVD; No carotid bruits LYMPHATICS: No lymphadenopathy CARDIAC: RRR, no murmurs, rubs, gallops.  Pacemaker pocket well-healed RESPIRATORY:  Clear to auscultation without rales, wheezing or rhonchi  ABDOMEN: Soft, non-tender, non-distended MUSCULOSKELETAL:  No edema; No deformity  SKIN: Warm and dry NEUROLOGIC:  Alert and oriented x 3 PSYCHIATRIC:  Normal affect        ASSESSMENT:    1. Paroxysmal atrial fibrillation (HCC)   2. Tachy-brady syndrome (HCC)   3. Cardiac pacemaker in situ   4. Primary hypertension    PLAN:    In order of problems listed above:  #Atrial tachycardia/fibrillation #Tachybradycardia syndrome #Pacemaker in situ  Pacemaker functioning appropriately.  Continue remote monitoring.  Continue Eliquis for stroke prophylaxis.  Increase metoprolol to 50 mg by mouth twice daily for better control of his atrial tachycardia  episodes   Follow-up 1 year with APP.   Medication Adjustments/Labs and Tests Ordered: Current medicines are reviewed at length with the patient today.  Concerns regarding medicines are outlined above.  No orders of the defined types were placed in this encounter.  Meds ordered this encounter  Medications   metoprolol succinate (TOPROL-XL) 50 MG 24 hr tablet    Sig: Take 1 tablet (50 mg total) by mouth 2 (two) times daily. Take with or immediately following a meal.    Dispense:  180 tablet    Refill:  3     Signed, Benjamin Mage, MD, [facility], [facility] 04/29/2022 1:02 PM    Electrophysiology [facility]

## 2022-04-30 DIAGNOSIS — L298 Other pruritus: Secondary | ICD-10-CM | POA: Diagnosis not present

## 2022-04-30 DIAGNOSIS — L738 Other specified follicular disorders: Secondary | ICD-10-CM | POA: Diagnosis not present

## 2022-04-30 DIAGNOSIS — D171 Benign lipomatous neoplasm of skin and subcutaneous tissue of trunk: Secondary | ICD-10-CM | POA: Diagnosis not present

## 2022-04-30 DIAGNOSIS — L821 Other seborrheic keratosis: Secondary | ICD-10-CM | POA: Diagnosis not present

## 2022-04-30 DIAGNOSIS — L570 Actinic keratosis: Secondary | ICD-10-CM | POA: Diagnosis not present

## 2022-05-06 ENCOUNTER — Ambulatory Visit (INDEPENDENT_AMBULATORY_CARE_PROVIDER_SITE_OTHER): Payer: PPO

## 2022-05-06 DIAGNOSIS — I495 Sick sinus syndrome: Secondary | ICD-10-CM

## 2022-05-11 LAB — CUP PACEART REMOTE DEVICE CHECK
Battery Remaining Longevity: 159 mo
Battery Voltage: 3.06 V
Brady Statistic AP VP Percent: 0.05 %
Brady Statistic AP VS Percent: 69.66 %
Brady Statistic AS VP Percent: 0.03 %
Brady Statistic AS VS Percent: 30.26 %
Brady Statistic RA Percent Paced: 69.53 %
Brady Statistic RV Percent Paced: 0.08 %
Date Time Interrogation Session: 20231027175153
Implantable Lead Connection Status: 753985
Implantable Lead Connection Status: 753985
Implantable Lead Implant Date: 20221024
Implantable Lead Implant Date: 20221024
Implantable Lead Location: 753859
Implantable Lead Location: 753860
Implantable Lead Model: 5076
Implantable Lead Model: 5076
Implantable Pulse Generator Implant Date: 20221024
Lead Channel Impedance Value: 304 Ohm
Lead Channel Impedance Value: 342 Ohm
Lead Channel Impedance Value: 475 Ohm
Lead Channel Impedance Value: 532 Ohm
Lead Channel Pacing Threshold Amplitude: 0.5 V
Lead Channel Pacing Threshold Amplitude: 0.625 V
Lead Channel Pacing Threshold Pulse Width: 0.4 ms
Lead Channel Pacing Threshold Pulse Width: 0.4 ms
Lead Channel Sensing Intrinsic Amplitude: 0.625 mV
Lead Channel Sensing Intrinsic Amplitude: 0.625 mV
Lead Channel Sensing Intrinsic Amplitude: 7.25 mV
Lead Channel Sensing Intrinsic Amplitude: 7.25 mV
Lead Channel Setting Pacing Amplitude: 1.5 V
Lead Channel Setting Pacing Amplitude: 2 V
Lead Channel Setting Pacing Pulse Width: 0.4 ms
Lead Channel Setting Sensing Sensitivity: 0.9 mV
Zone Setting Status: 755011
Zone Setting Status: 755011

## 2022-05-14 NOTE — Telephone Encounter (Signed)
Error

## 2022-05-18 ENCOUNTER — Other Ambulatory Visit: Payer: Self-pay | Admitting: Cardiovascular Disease

## 2022-05-19 NOTE — Progress Notes (Signed)
Remote pacemaker transmission.   

## 2022-05-28 DIAGNOSIS — J069 Acute upper respiratory infection, unspecified: Secondary | ICD-10-CM | POA: Diagnosis not present

## 2022-06-05 DIAGNOSIS — J069 Acute upper respiratory infection, unspecified: Secondary | ICD-10-CM | POA: Diagnosis not present

## 2022-06-12 NOTE — Progress Notes (Unsigned)
Cardiology Office Note    Date:  06/16/2022   ID:  Benjamin Brewer, DOB 1930/10/06, MRN [id number]  PCP:  Derinda Late, MD  Cardiologist:  Kathlyn Sacramento, MD  Electrophysiologist:  Vickie Epley, MD   Chief Complaint: Follow-up  History of Present Illness:   Benjamin Brewer is a 88 y.o. male with history of CAD status post PCI/DES to the LAD and RCA in 2009, PAF on apixaban, tachybradycardia syndrome status post PPM, renal dysfunction, HTN, HLD, and cervical spine disease who presents for follow-up of CAD and A-fib.   He underwent PCI/DES to the LAD and RCA in 2009.  He was diagnosed with A-fib in 2015 and has been on anticoagulation since.  Echo in 01/2018 demonstrated an EF of 60 to 65%, calcified aortic valve without significant stenosis, mildly dilated left atrium, and no significant pulmonary hypertension.  He was diagnosed with COVID in 11/2020 with noted severe intermittent diarrhea since.  In that setting, he had worsening palpitations with episodes of dizziness with a sense of a pause in his heartbeat.  Subsequent Zio patch showed sinus rhythm with an average rate of 70 bpm.  There was 1 run of NSVT lasting 17 beats, 18 episodes of SVT with the longest lasting 15 seconds, and one 4.1-second pause.  He continued to have intermittent palpitations as well as presyncope and was evaluated by EP for tachybradycardia syndrome.  He subsequently underwent PPM implantation in 04/2021.  He was seen in the ED in 01/2022 with dizziness and presyncope.  BP 149/62 with a heart rate of 67 bpm.  Pacemaker interrogation was unremarkable.  CT head showed no acute abnormalities.  Labs showed a slight elevation in his serum creatinine at 1.49.  He was seen in the office for follow-up of the above ED visit and was without symptoms of chest pain, though did report worsening exertional dyspnea since his ED visit.  He continued to feel off balance and dizzy.  He also reported feeling that his heart stopped for  a few seconds.  He was not orthostatic.  Zio patch was recommended.  Echo on 02/04/2022 demonstrated an EF of 60 to 65%, no regional wall motion abnormalities, moderate LVH, grade 1 diastolic dysfunction, normal RV systolic function and ventricular cavity size and RVSP, mildly dilated left atrium, mildly to moderately dilated right atrium, trivial mitral regurgitation, aortic valve sclerosis without evidence of stenosis, and a mildly dilated aortic root measuring 42 mm.  PPM interrogation on 02/05/2022 showed 2 brief episodes of NSVT with the longest lasting 2 seconds.  He was seen by general cardiology in 03/2022 and was without symptoms of angina or decompensation.  Dizziness had improved.  Zio patch subsequently demonstrated a predominant rhythm of sinus with an average of 70 bpm, possible ventricular pacing present, 3 episodes of NSVT lasting up to 8 beats, 74 episodes of SVT with the longest interval lasting 30 seconds, less than 1% A-fib burden with the longest episode lasting 1 minute and 56 seconds, and occasional PACs and PVCs.  He was last seen by EP in 04/2022 with remote interrogations showing stable function with several high ventricular rate episodes that appeared to be NSVT.  He continued to feel intermittent palpitations that would last for seconds at a time.  Metoprolol was titrated to 50 mg twice daily.  He comes in doing well from a cardiac perspective and is without symptoms of angina or decompensation.  He has been taking Toprol-XL 25 mg twice daily rather than  50 mg twice daily as he realized when he got home following his last visit he was only taking 25 mg once daily.  With this titration of metoprolol, he has noted an improvement in his palpitation burden, though does continue to note brief tachypalpitations at times.  He is asymptomatic with these.  No falls or symptoms concerning for bleeding.  No significant lower extremity swelling or progressive orthopnea.  He is getting over a URI.  He  is having difficulty retracting the foreskin of his penis and requests a referral to urology.   Labs independently reviewed: 01/2022 - Hgb 13.0, PLT 236, potassium 4.0, BUN 16, serum creatinine 1.49 09/2021 - albumin 4.3, AST/ALT normal, TC 150, TG 153, HDL 37, LDL 82 05/2021 - magnesium 2.1 12/2018 - TSH normal  Past Medical History:  Diagnosis Date   A-fib ([facility])    Arthritis    hands   Benign prostatic hypertrophy    Hx of    CAD (coronary artery disease)    native vessel. Status post LAD and RCA angioplasty and drug-eluting stent placement in 2009   Dental bridge present    implants, no bridge   Dizziness    Dyslipidemia    GERD (gastroesophageal reflux disease)    History of tachycardia    Hyperlipidemia    Hypertension    Intermediate coronary syndrome (HCC)    angina, unstable   Numbness and tingling of both lower extremities    Other dyspnea and respiratory abnormality    on exertion   Pinched nerve in neck    some discomfort with side to side movement   Vertigo    approx 1x/month    Past Surgical History:  Procedure Laterality Date   [place]; x2 (last stent 2009)   [place] W/PHACO Left 07/03/2019   Procedure: CATARACT EXTRACTION PHACO AND INTRAOCULAR LENS PLACEMENT ([place]) LEFT 10.19  01:06.9;  Surgeon: Eulogio Bear, MD;  Location: [facility];  Service: Ophthalmology;  Laterality: Left;   ESOPHAGOGASTRODUODENOSCOPY (EGD) WITH PROPOFOL N/A 06/10/2015   Procedure: ESOPHAGOGASTRODUODENOSCOPY (EGD) WITH PROPOFOL;  Surgeon: Manya Silvas, MD;  Location: [facility] ENDOSCOPY;  Service: Endoscopy;  Laterality: N/A;   HERNIA REPAIR     PACEMAKER IMPLANT N/A 05/05/2021   Procedure: PACEMAKER IMPLANT;  Surgeon: Vickie Epley, MD;  Location: [facility];  Service: Cardiovascular;  Laterality: N/A;   SAVORY DILATION N/A 06/10/2015    Procedure: SAVORY DILATION;  Surgeon: Manya Silvas, MD;  Location: [facility] ENDOSCOPY;  Service: Endoscopy;  Laterality: N/A;   TONSILLECTOMY      Current Medications: Current Meds  Medication Sig   atorvastatin (LIPITOR) 40 MG tablet TAKE 1 TABLET BY MOUTH EVERY MORNING   Carboxymethylcellul-Glycerin (LUBRICATING EYE DROPS OP) Place 1 drop into both eyes daily as needed (dry eyes).   Cholecalciferol (VITAMIN D3 PO) Take 1 capsule by mouth daily.   cyanocobalamin 500 MCG tablet Take 500 mcg by mouth 2 (two) times daily.   diltiazem (CARDIZEM) 30 MG tablet TAKE ONE TABLET EVERY FOUR HOURS AS NEEDED FOR AFIB HEART RATE >100   doxycycline (VIBRAMYCIN) 100 MG capsule Take 100 mg by mouth 2 (two) times daily.   ELIQUIS 5 MG TABS tablet TAKE 1 TABLET BY MOUTH TWICE DAILY   ferrous sulfate 325 (65 FE) MG tablet Take 325 mg by mouth daily  with breakfast.   Fiber-Vitamins-Minerals ([employer] FIBER GUMMIES) CHEW Chew by mouth.   finasteride (PROSCAR) 5 MG tablet Take 5 mg by mouth daily.   gabapentin (NEURONTIN) 300 MG capsule Take 300 mg by mouth at bedtime.   ipratropium (ATROVENT) 0.03 % nasal spray 1 spray as needed.   losartan (COZAAR) 25 MG tablet TAKE 1 TABLET BY MOUTH DAILY   metoprolol succinate (TOPROL-XL) 25 MG 24 hr tablet Take 25 mg by mouth 2 (two) times daily at 10 AM and 5 PM.   Multiple Vitamin (MULTIVITAMIN) tablet Take 1 tablet by mouth daily.   Multiple Vitamins-Minerals (PRESERVISION AREDS 2) CAPS Take 1 capsule by mouth 2 (two) times daily.   nitroGLYCERIN (NITROSTAT) 0.4 MG SL tablet Place 1 nitroglycerin under your tongue, while sitting. If no relief of pain, may repeat 1 tablet every 5 minutes up to 3 tablets over 15 minutes.   pantoprazole (PROTONIX) 40 MG tablet TAKE 1 TABLET BY MOUTH NIGHTLY    Allergies:   Prednisone and Terazosin   Social History   Socioeconomic History   Marital status: Widowed    Spouse name: Not on file   Number of children: 1   Years of  education: Not on file   Highest education level: Bachelor's degree (e.g., BA, AB, BS)  Occupational History   Not on file  Tobacco Use   Smoking status: Never   Smokeless tobacco: Never  Vaping Use   Vaping Use: Never used  Substance and Sexual Activity   Alcohol use: Yes    Alcohol/week: 1.0 standard drink of alcohol    Types: 1 Glasses of wine per week    Comment: occasional    Drug use: No   Sexual activity: Not on file  Other Topics Concern   Not on file  Social History Narrative   Widowed retired 12/95. Gets regular exercise.       Lives at home alone   Right handed   Social Determinants of Health   Financial Resource Strain: Not on file  Food Insecurity: Not on file  Transportation Needs: Not on file  Physical Activity: Not on file  Stress: Not on file  Social Connections: Not on file     Family History:  The patient's family history includes Heart attack in his mother; Hypertension in his mother; Stroke in his mother and another family member.  ROS:   12-point review of systems is negative unless otherwise noted in the HPI.   EKGs/Labs/Other Studies Reviewed:    Studies reviewed were summarized above. The additional studies were reviewed today:  Zio patch 01/2022: Patient had a min HR of 57 bpm, max HR of 226 bpm, and avg HR of 70 bpm. Predominant underlying rhythm was Sinus Rhythm. Possible Ventricular Pacing was present.  3 Ventricular Tachycardia runs occurred, the run with the fastest interval lasting 8 beats with a max rate of 226 bpm, the longest lasting 4 beats with an avg rate of 101 bpm.  74 Supraventricular Tachycardia runs occurred, the run with the fastest interval lasting 12 beats with a max rate of 190 bpm, the longest lasting 30.0 secs with an avg rate of 113 bpm.  Atrial Fibrillation occurred (<1% burden), ranging from 85-120 bpm (avg of 104 bpm), the longest lasting 1 min 56 secs with an avg rate of 104 bpm. Supraventricular Tachycardia was  detected within +/- 45 seconds of symptomatic patient event(s).  Occasional PACs and rare PVCs. __________  Echo 02/04/2022: 1. Left ventricular ejection fraction, by  estimation, is 60 to 65%. The  left ventricle has normal function. The left ventricle has no regional  wall motion abnormalities. There is moderate left ventricular hypertrophy.  Left ventricular diastolic  parameters are consistent with Grade I diastolic dysfunction (impaired  relaxation).   2. Right ventricular systolic function is normal. The right ventricular  size is normal. There is normal pulmonary artery systolic pressure.   3. Left atrial size was mildly dilated.   4. Right atrial size was mild to moderately dilated.   5. The mitral valve is grossly normal. Trivial mitral valve  regurgitation. No evidence of mitral stenosis.   6. The aortic valve is tricuspid. There is mild thickening of the aortic  valve. Aortic valve regurgitation is not visualized. Aortic valve  sclerosis is present, with no evidence of aortic valve stenosis.   7. Aortic dilatation noted. There is mild dilatation of the aortic root,  measuring 42 mm. __________   Limited echo 04/25/2021: 1. Left ventricular ejection fraction, by estimation, is 60 to 65%. The  left ventricle has normal function. The left ventricle has no regional  wall motion abnormalities. There is mild left ventricular hypertrophy.  Left ventricular diastolic parameters  are consistent with Grade I diastolic dysfunction (impaired relaxation).  The average left ventricular global longitudinal strain is -17.0 %. The  global longitudinal strain is normal.   2. Right ventricular systolic function is normal. The right ventricular  size is normal. Tricuspid regurgitation signal is inadequate for assessing  PA pressure.   3. The mitral valve is normal in structure. Mild to moderate mitral valve  regurgitation. No evidence of mitral stenosis.   4. The aortic valve was not well  visualized. Aortic valve regurgitation  is not visualized. Mild to moderate aortic valve sclerosis/calcification  is present, without any evidence of aortic stenosis.   5. The inferior vena cava is normal in size with greater than 50%  respiratory variability, suggesting right atrial pressure of 3 mmHg. __________   2D echo 05/09/2020: 1. Left ventricular ejection fraction, by estimation, is 55 to 60%. The  left ventricle has normal function. The left ventricle has no regional  wall motion abnormalities. Left ventricular diastolic parameters are  consistent with Grade I diastolic  dysfunction (impaired relaxation). The average left ventricular global  longitudinal strain is -4.8 %.   2. Right ventricular systolic function is normal. The right ventricular  size is normal. There is normal pulmonary artery systolic pressure. The  estimated right ventricular systolic pressure is 02.5 mmHg.   3. Left atrial size was mildly dilated.   4. Mild to moderate mitral valve regurgitation.   5. The aortic valve is moderately calcified. Aortic valve regurgitation  is not visualized. Mild to moderate aortic valve sclerosis/calcification  is present, without any evidence of aortic stenosis.   6. There is borderline dilatation of the aortic root, measuring 38 mm. __________   Elwyn Reach 01/2021: Patient had a min HR of 40 bpm, max HR of 182 bpm, and avg HR of 70 bpm.  Predominant underlying rhythm was Sinus Rhythm. 1 run of Ventricular Tachycardia occurred lasting 17 beats with a max rate of 154 bpm (avg 136 bpm).  88 Supraventricular Tachycardia runs occurred, the run with the fastest interval lasting 5 beats with a max rate of 182 bpm, the longest lasting 14.7 secs with an avg rate of 103 bpm. 1 Pause occurred lasting 4.1 secs (15 bpm). True duration of Pause difficult to ascertain due to artifact.  Occasional PACs  and rare PVCs. __________   2D echo 01/2018: - Left ventricle: The cavity size was normal.  There was mild   concentric hypertrophy. Systolic function was normal. The   estimated ejection fraction was in the range of 60% to 65%. Wall   motion was normal; there were no regional wall motion   abnormalities. Doppler parameters are consistent with abnormal   left ventricular relaxation (grade 1 diastolic dysfunction). - Aortic valve: Trileaflet; moderately thickened, moderately   calcified leaflets, predominantly non-coronary leaflet. Valve   mobility was restricted. Sclerosis without stenosis. There was no   regurgitation. - Aortic root: The aortic root was normal in size. - Mitral valve: There was trivial regurgitation. - Left atrium: The atrium was mildly dilated. - Right ventricle: The cavity size was normal. Wall thickness was   normal. Systolic function was normal. - Right atrium: The atrium was normal in size. - Tricuspid valve: There was trivial regurgitation. - Pulmonic valve: There was trivial regurgitation. - Pulmonary arteries: Systolic pressure was within the normal   range. - Inferior vena cava: The vessel was normal in size. - Pericardium, extracardiac: There was no pericardial effusion. __________   48-hour Holter 01/2018: Normal sinus rhythm with average heart rate of 65 bpm.  Minimal heart rate of 43 bpm with the longest pause of 1.64 seconds Rare PACs and PVCs. Short runs of SVT with the longest run of 14 beats with a heart rate of 142 bpm Some of the patient's reported events, correlated with PACs and PVCs.   EKG:  EKG is ordered today.  The EKG ordered today demonstrates a paced rhythm with prolonged AV conduction, 66 bpm  Recent Labs: 01/21/2022: BUN 16; Creatinine, Ser 1.49; Hemoglobin 13.0; Platelets 236; Potassium 4.0; Sodium 140  Recent Lipid Panel    Component Value Date/Time   CHOL 115 10/08/2012 0554   TRIG 75 10/08/2012 0554   HDL 43 10/08/2012 0554   VLDL 15 10/08/2012 0554   LDLCALC 57 10/08/2012 0554    PHYSICAL EXAM:    VS:  BP  110/72 (BP Location: Left Arm, Patient Position: Sitting, Cuff Size: Normal)   Pulse 66   Ht '5\' 10"'$  (1.778 m)   Wt 209 lb 2 oz (94.9 kg)   SpO2 97%   BMI 30.01 kg/m   BMI: Body mass index is 30.01 kg/m.  Physical Exam Vitals reviewed.  Constitutional:      Appearance: He is well-developed.  HENT:     Head: Normocephalic and atraumatic.  Eyes:     General:        Right eye: No discharge.        Left eye: No discharge.  Neck:     Vascular: No JVD.  Cardiovascular:     Rate and Rhythm: Normal rate and regular rhythm.     Pulses:          Posterior tibial pulses are 2+ on the right side and 2+ on the left side.     Heart sounds: Normal heart sounds, S1 normal and S2 normal. Heart sounds not distant. No midsystolic click and no opening snap. No murmur heard.    No friction rub.  Pulmonary:     Effort: Pulmonary effort is normal. No respiratory distress.     Breath sounds: Normal breath sounds. No decreased breath sounds, wheezing or rales.  Chest:     Chest wall: No tenderness.  Abdominal:     General: There is no distension.  Musculoskeletal:  Cervical back: Normal range of motion.     Right lower leg: No edema.     Left lower leg: No edema.  Skin:    General: Skin is warm and dry.     Nails: There is no clubbing.  Neurological:     Mental Status: He is alert and oriented to person, place, and time.  Psychiatric:        Speech: Speech normal.        Behavior: Behavior normal.        Thought Content: Thought content normal.        Judgment: Judgment normal.     Wt Readings from Last 3 Encounters:  06/16/22 209 lb 2 oz (94.9 kg)  04/29/22 211 lb (95.7 kg)  03/17/22 206 lb (93.4 kg)     ASSESSMENT & PLAN:   CAD involving the native coronary arteries without angina: He is doing well and is without symptoms concerning for angina.  He remains on apixaban in place of aspirin, given underlying A-fib, to minimize bleeding risk.  Continue aggressive risk factor  modification including atorvastatin, losartan, metoprolol succinate, and as needed SL NTG.  No indication for ischemic testing at this time.  PAF: Maintaining sinus rhythm with an A-paced rhythm.  Continue Toprol-XL.  CHA2DS2-VASc at least 4 (HTN, age x 2, vascular disease).  He remains on apixaban 5 mg twice daily and does not currently meet reduced dosing criteria.  No symptoms concerning for bleeding.  Recent labs stable.  Tachybradycardia syndrome: Status post PPM.  Followed by EP.  HTN: Blood pressure is well-controlled in the office today.  Continue current medical therapy.  HLD: LDL 69 in 02/2022.  He remains on atorvastatin 40 mg.  Dizziness with presyncope: No further episodes.    Presumed phimosis: Referral to urology at patient request.   Disposition: F/u with Dr. Fletcher Anon or an APP in 6 months, and EP as directed.   Medication Adjustments/Labs and Tests Ordered: Current medicines are reviewed at length with the patient today.  Concerns regarding medicines are outlined above. Medication changes, Labs and Tests ordered today are summarized above and listed in the Patient Instructions accessible in Encounters.   Signed, Christell Faith, PA-C 06/16/2022 11:57 AM     [facility] [address] [place] [address], [place] [address] [phone]

## 2022-06-16 ENCOUNTER — Ambulatory Visit: Payer: PPO | Attending: Physician Assistant | Admitting: Physician Assistant

## 2022-06-16 ENCOUNTER — Encounter: Payer: Self-pay | Admitting: Physician Assistant

## 2022-06-16 VITALS — BP 110/72 | HR 66 | Ht 70.0 in | Wt 209.1 lb

## 2022-06-16 DIAGNOSIS — I480 Paroxysmal atrial fibrillation: Secondary | ICD-10-CM

## 2022-06-16 DIAGNOSIS — N471 Phimosis: Secondary | ICD-10-CM

## 2022-06-16 DIAGNOSIS — I10 Essential (primary) hypertension: Secondary | ICD-10-CM | POA: Diagnosis not present

## 2022-06-16 DIAGNOSIS — R55 Syncope and collapse: Secondary | ICD-10-CM

## 2022-06-16 DIAGNOSIS — E785 Hyperlipidemia, unspecified: Secondary | ICD-10-CM

## 2022-06-16 DIAGNOSIS — I2510 Atherosclerotic heart disease of native coronary artery without angina pectoris: Secondary | ICD-10-CM

## 2022-06-16 DIAGNOSIS — I495 Sick sinus syndrome: Secondary | ICD-10-CM

## 2022-06-16 DIAGNOSIS — Z950 Presence of cardiac pacemaker: Secondary | ICD-10-CM | POA: Diagnosis not present

## 2022-06-16 NOTE — Patient Instructions (Addendum)
Referral placed to urology.  [facility] [address] [place],  [place]  [id number] Main number: [phone]  Medication Instructions:  No changes at this time.   *If you need a refill on your cardiac medications before your next appointment, please call your pharmacy*   Lab Work: None  If you have labs (blood work) drawn today and your tests are completely normal, you will receive your results only by: [place] (if you have MyChart) OR A paper copy in the mail If you have any lab test that is abnormal or we need to change your treatment, we will call you to review the results.   Testing/Procedures: None    Follow-Up: At [facility], you and your health needs are our priority.  As part of our continuing mission to provide you with exceptional heart care, we have created designated Provider Care Teams.  These Care Teams include your primary Cardiologist (physician) and Advanced Practice Providers (APPs -  Physician Assistants and Nurse Practitioners) who all work together to provide you with the care you need, when you need it.  Your next appointment:   6 month(s)  The format for your next appointment:   In Person  Provider:   Kathlyn Sacramento, MD or Christell Faith, PA-C        Important Information About Sugar

## 2022-07-01 ENCOUNTER — Ambulatory Visit: Payer: PPO | Admitting: Urology

## 2022-07-01 VITALS — BP 154/75 | HR 79 | Ht 70.0 in | Wt 204.5 lb

## 2022-07-01 DIAGNOSIS — N471 Phimosis: Secondary | ICD-10-CM | POA: Diagnosis not present

## 2022-07-01 MED ORDER — NYSTATIN-TRIAMCINOLONE 100000-0.1 UNIT/GM-% EX OINT: 1.0000 | TOPICAL_OINTMENT | Freq: Two times a day (BID) | CUTANEOUS | 3 refills | Status: DC

## 2022-07-01 NOTE — Patient Instructions (Signed)
Circumcision, Adult  Circumcision is a surgery to remove or cut the fold of skin that covers the head of the penis (foreskin). When the foreskin is cut but not removed, the procedure is called a dorsal incision or dorsal circumcision. A dorsal circumcision leaves the entire foreskin but makes the end of the foreskin looser so it can be pulled back over the head of the penis. Tell a health care provider about: Any allergies you have. All medicines you are taking, including vitamins, herbs, eye drops, creams, and over-the-counter medicines. Any problems you or family members have had with anesthetic medicines. Any bleeding problems you have. Any surgeries you have had. Any medical conditions you have, including the common cold or another infection. What are the risks? Generally, this is a safe procedure. However, problems may occur, including: Bleeding. Pain. Infection. Allergic reactions to medicines. Opening of the surgical wound. This can occur from an unwanted erection after surgery. What happens before the procedure? When to stop eating and drinking Follow instructions from your health care provider about what you may eat and drink before your procedure. These may include: 8 hours before your procedure Stop eating most foods. Do not eat meat, fried foods, or fatty foods. Eat only light foods, such as toast or crackers. All liquids are okay except energy drinks and alcohol. 6 hours before your procedure Stop eating. Drink only clear liquids, such as water, clear fruit juice, black coffee, plain tea, and sports drinks. Do not drink energy drinks or alcohol. 2 hours before your procedure Stop drinking all liquids. You may be allowed to take medicines with small sips of water. If you do not follow your health care provider's instructions, your procedure may be delayed or canceled. Medicines Ask your health care provider about: Changing or stopping your regular medicines. This is  especially important if you are taking diabetes medicines or blood thinners. Taking medicines such as aspirin and ibuprofen. These medicines can thin your blood. Do not take these medicines unless your health care provider tells you to take them. Taking over-the-counter medicines, vitamins, herbs, and supplements. General instructions  Do not use any products that contain nicotine or tobacco for at least 4 weeks before the procedure. These products include cigarettes, chewing tobacco, and vaping devices, such as e-cigarettes. If you need help quitting, ask your health care provider. If you will be going home right after the procedure, plan to have a responsible adult: Take you home from the hospital or clinic. You will not be allowed to drive. Care for you for the time you are told. Ask your health care provider: How your surgery site will be marked. What steps will be taken to help prevent infection. These may include: Washing skin with a germ-killing soap. Taking antibiotic medicine. What happens during the procedure? An IV may be inserted into one of your veins. You will be given one or more of the following: A medicine to help you relax (sedative). A medicine to numb the area (local anesthetic). This will be injected with a needle into the skin of your penis. An incision will be made to remove or cut the foreskin. Absorbable stitches (sutures) may be used to close the incision. A bandage (dressing) will be applied to the incision site. The IV will be removed. The procedure may vary among health care providers and hospitals. What happens after the procedure? Your blood pressure, heart rate, breathing rate, and blood oxygen level will be monitored until you leave the hospital or clinic.  Do not get out of bed until your health care provider approves. If you were given a sedative during the procedure, it can affect you for several hours. Do not drive or operate machinery until your health  care provider says that it is safe. Summary Circumcision is a surgery to remove or cut the fold of skin that covers the head of the penis. Follow instructions from your health care provider about eating and drinking before your procedure. If you will be going home right after the procedure, plan to have a responsible adult take you home and care for you for the time you are told. This information is not intended to replace advice given to you by your health care provider. Make sure you discuss any questions you have with your health care provider. Document Revised: 06/30/2021 Document Reviewed: 06/30/2021 Elsevier Patient Education  [place].

## 2022-07-01 NOTE — Progress Notes (Incomplete)
I, Jeanmarie Hubert Maxie,acting as a scribe for Hollice Espy, MD.,have documented all relevant documentation on the behalf of Hollice Espy, MD,as directed by  Hollice Espy, MD while in the presence of Hollice Espy, MD.   07/01/22 2:49 PM   Benjamin Brewer 1931/05/31 [id number]  Referring provider: Derinda Late, MD [phone] [address] [facility] [place],  [place] [address]  Chief Complaint  Patient presents with   Establish Care   phimosis    HPI: 88 year-old male who is a new patient here for further evaluation of phimosis.  He notes his penis has shrunk and he is having trouble retracting the foreskin which has caused him to have an odor. He states that this has been ongoing for the last few months. He denies any urinary issues. He has not tried any treatments for this. He does not have a history of diabetes.    PMH: Past Medical History:  Diagnosis Date   A-fib [facility])    Arthritis    hands   Benign prostatic hypertrophy    Hx of    CAD (coronary artery disease)    native vessel. Status post LAD and RCA angioplasty and drug-eluting stent placement in 2009   Dental bridge present    implants, no bridge   Dizziness    Dyslipidemia    GERD (gastroesophageal reflux disease)    History of tachycardia    Hyperlipidemia    Hypertension    Intermediate coronary syndrome (HCC)    angina, unstable   Numbness and tingling of both lower extremities    Other dyspnea and respiratory abnormality    on exertion   Pinched nerve in neck    some discomfort with side to side movement   Vertigo    approx 1x/month    Surgical History: Past Surgical History:  Procedure Laterality Date   [place]; x2 (last stent 2009)   [place] W/PHACO Left 07/03/2019   Procedure: CATARACT EXTRACTION PHACO AND INTRAOCULAR LENS PLACEMENT  ([place]) LEFT 10.19  01:06.9;  Surgeon: Eulogio Bear, MD;  Location: [place];  Service: Ophthalmology;  Laterality: Left;   ESOPHAGOGASTRODUODENOSCOPY (EGD) WITH PROPOFOL N/A 06/10/2015   Procedure: ESOPHAGOGASTRODUODENOSCOPY (EGD) WITH PROPOFOL;  Surgeon: Manya Silvas, MD;  Location[facility];  Service: Endoscopy;  Laterality: N/A;   HERNIA REPAIR     PACEMAKER IMPLANT N/A 05/05/2021   Procedure: PACEMAKER IMPLANT;  Surgeon: Vickie Epley, MD;  Location: [place] CV LAB;  Service: Cardiovascular;  Laterality: N/A;   SAVORY DILATION N/A 06/10/2015   Procedure: SAVORY DILATION;  Surgeon: Manya Silvas, MD;  Location: [facility] ENDOSCOPY;  Service: Endoscopy;  Laterality: N/A;   TONSILLECTOMY      Home Medications:  Allergies as of 07/01/2022       Reactions   Prednisone    "they think threw me into A-fib"   Terazosin Other (See Comments)   Muscle Pain        Medication List        Accurate as of July 01, 2022  2:49 PM. If you have any questions, ask your nurse or doctor.          STOP taking these medications    doxycycline 100 MG capsule Commonly known as: VIBRAMYCIN       TAKE  these medications    atorvastatin 40 MG tablet Commonly known as: LIPITOR TAKE 1 TABLET BY MOUTH EVERY MORNING   [employer] Fiber Gummies Chew Chew by mouth.   cyanocobalamin 500 MCG tablet Commonly known as: VITAMIN B12 Take 500 mcg by mouth 2 (two) times daily.   diltiazem 30 MG tablet Commonly known as: CARDIZEM TAKE ONE TABLET EVERY FOUR HOURS AS NEEDED FOR AFIB HEART RATE >100   Eliquis 5 MG Tabs tablet Generic drug: apixaban TAKE 1 TABLET BY MOUTH TWICE DAILY   ferrous sulfate 325 (65 FE) MG tablet Take 325 mg by mouth daily with breakfast.   finasteride 5 MG tablet Commonly known as: PROSCAR Take 5 mg by mouth daily.   gabapentin 300 MG capsule Commonly known as: NEURONTIN Take 300 mg by mouth at bedtime.   ipratropium 0.03 % nasal  spray Commonly known as: ATROVENT 1 spray as needed.   losartan 25 MG tablet Commonly known as: COZAAR TAKE 1 TABLET BY MOUTH DAILY   LUBRICATING EYE DROPS OP Place 1 drop into both eyes daily as needed (dry eyes).   metoprolol succinate 25 MG 24 hr tablet Commonly known as: TOPROL-XL Take 25 mg by mouth 2 (two) times daily at 10 AM and 5 PM. What changed: Another medication with the same name was removed. Continue taking this medication, and follow the directions you see here.   multivitamin tablet Take 1 tablet by mouth daily.   nitroGLYCERIN 0.4 MG SL tablet Commonly known as: NITROSTAT Place 1 nitroglycerin under your tongue, while sitting. If no relief of pain, may repeat 1 tablet every 5 minutes up to 3 tablets over 15 minutes.   nystatin-triamcinolone ointment Commonly known as: MYCOLOG Apply 1 Application topically 2 (two) times daily.   pantoprazole 40 MG tablet Commonly known as: PROTONIX TAKE 1 TABLET BY MOUTH NIGHTLY   PreserVision AREDS 2 Caps Take 1 capsule by mouth 2 (two) times daily.   VITAMIN D3 PO Take 1 capsule by mouth daily.        Allergies:  Allergies  Allergen Reactions   Prednisone     "they think threw me into A-fib"   Terazosin Other (See Comments)    Muscle Pain    Family History: Family History  Problem Relation Age of Onset   Stroke Mother    Heart attack Mother    Hypertension Mother    Stroke Other        family history    Social History:  reports that he has never smoked. He has never used smokeless tobacco. He reports current alcohol use of about 1.0 standard drink of alcohol per week. He reports that he does not use drugs.   Physical Exam: BP (!) 154/75   Pulse 79   Ht '5\' 10"'$  (1.778 m)   Wt 204 lb 8 oz (92.8 kg)   BMI 29.34 kg/m   Constitutional:  Alert and oriented, No acute distress. HEENT: [place] AT, moist mucus membranes.  Trachea midline, no masses. GU: Uncircumcised, inflammation associated with  discharge Neurologic: Grossly intact, no focal deficits, moving all 4 extremities. Psychiatric: Normal mood and affect.  Assessment & Plan:    Phimosis - We discussed circumcision vs topical cream. We reviewed risks of a circumcision which includes infection or bleeding. He is aware that he would need cardiology clearance prior to the procedure. - Start Mycolog BID daily. We will further evaluate if symptoms worsen or fail to improve.   Return in about 6 weeks (around  08/12/2022) for with Sam or Larene [place].   [facility] [address], [place], [place] [address] [phone]

## 2022-07-05 DIAGNOSIS — J069 Acute upper respiratory infection, unspecified: Secondary | ICD-10-CM | POA: Diagnosis not present

## 2022-07-10 DIAGNOSIS — R053 Chronic cough: Secondary | ICD-10-CM | POA: Diagnosis not present

## 2022-07-10 DIAGNOSIS — R110 Nausea: Secondary | ICD-10-CM | POA: Diagnosis not present

## 2022-07-23 DIAGNOSIS — J209 Acute bronchitis, unspecified: Secondary | ICD-10-CM | POA: Diagnosis not present

## 2022-08-05 ENCOUNTER — Ambulatory Visit: Payer: PPO | Attending: Cardiology

## 2022-08-05 DIAGNOSIS — I495 Sick sinus syndrome: Secondary | ICD-10-CM

## 2022-08-05 DIAGNOSIS — M5412 Radiculopathy, cervical region: Secondary | ICD-10-CM | POA: Diagnosis not present

## 2022-08-05 DIAGNOSIS — M5030 Other cervical disc degeneration, unspecified cervical region: Secondary | ICD-10-CM | POA: Insufficient documentation

## 2022-08-05 DIAGNOSIS — M25521 Pain in right elbow: Secondary | ICD-10-CM | POA: Diagnosis not present

## 2022-08-05 DIAGNOSIS — M542 Cervicalgia: Secondary | ICD-10-CM | POA: Diagnosis not present

## 2022-08-07 LAB — CUP PACEART REMOTE DEVICE CHECK
Battery Remaining Longevity: 157 mo
Battery Voltage: 3.04 V
Brady Statistic AP VP Percent: 0.04 %
Brady Statistic AP VS Percent: 64.95 %
Brady Statistic AS VP Percent: 0.02 %
Brady Statistic AS VS Percent: 34.98 %
Brady Statistic RA Percent Paced: 64.84 %
Brady Statistic RV Percent Paced: 0.07 %
Date Time Interrogation Session: 20240125154042
Implantable Lead Connection Status: 753985
Implantable Lead Connection Status: 753985
Implantable Lead Implant Date: 20221024
Implantable Lead Implant Date: 20221024
Implantable Lead Location: 753859
Implantable Lead Location: 753860
Implantable Lead Model: 5076
Implantable Lead Model: 5076
Implantable Pulse Generator Implant Date: 20221024
Lead Channel Impedance Value: 342 Ohm
Lead Channel Impedance Value: 380 Ohm
Lead Channel Impedance Value: 437 Ohm
Lead Channel Impedance Value: 551 Ohm
Lead Channel Pacing Threshold Amplitude: 0.5 V
Lead Channel Pacing Threshold Amplitude: 0.625 V
Lead Channel Pacing Threshold Pulse Width: 0.4 ms
Lead Channel Pacing Threshold Pulse Width: 0.4 ms
Lead Channel Sensing Intrinsic Amplitude: 0.625 mV
Lead Channel Sensing Intrinsic Amplitude: 0.625 mV
Lead Channel Sensing Intrinsic Amplitude: 7.375 mV
Lead Channel Sensing Intrinsic Amplitude: 7.375 mV
Lead Channel Setting Pacing Amplitude: 1.5 V
Lead Channel Setting Pacing Amplitude: 2 V
Lead Channel Setting Pacing Pulse Width: 0.4 ms
Lead Channel Setting Sensing Sensitivity: 0.9 mV
Zone Setting Status: 755011
Zone Setting Status: 755011

## 2022-08-11 NOTE — Progress Notes (Signed)
08/12/2022 1:05 PM   Benjamin Brewer 30-Jul-1930 [id number]  Referring provider: [name], MD [phone] [facility] [place],  [place] [address]  Urological history: 1. BPH -PSA (05/2020) 0.86  Chief Complaint  Patient presents with   Follow-up    HPI: Benjamin Brewer is a 89 y.o. male who presents today for 6 weeks after a trial of Mycolog BID for phimosis.   He states that the Mycolog cream did not really change his situation.  He started experiencing excruciating neck pain that is radiating to his right arm over the weekend.  He was seen in urgent care for this and prescribed some medicine, but it is to continue to get worse.  He is going to seek care in the emergency department when he leaves here.  PMH: Past Medical History:  Diagnosis Date   A-fib [facility])    Arthritis    hands   Benign prostatic hypertrophy    Hx of    CAD (coronary artery disease)    native vessel. Status post LAD and RCA angioplasty and drug-eluting stent placement in 2009   Dental bridge present    implants, no bridge   Dizziness    Dyslipidemia    GERD (gastroesophageal reflux disease)    History of tachycardia    Hyperlipidemia    Hypertension    Intermediate coronary syndrome (HCC)    angina, unstable   Numbness and tingling of both lower extremities    Other dyspnea and respiratory abnormality    on exertion   Pinched nerve in neck    some discomfort with side to side movement   Vertigo    approx 1x/month    Surgical History: Past Surgical History:  Procedure Laterality Date   CARDIAC CATHETERIZATION     [place]; x2 (last stent 2009)   CARDIAC CATHETERIZATION     [facility]   CARDIAC CATHETERIZATION     [facility]   CATARACT EXTRACTION W/PHACO Left 07/03/2019   Procedure: CATARACT EXTRACTION PHACO AND INTRAOCULAR LENS PLACEMENT ([facility]) LEFT 10.19  01:06.9;  Surgeon: Nevada Crane, MD;  Location: [facility];   Service: Ophthalmology;  Laterality: Left;   ESOPHAGOGASTRODUODENOSCOPY (EGD) WITH PROPOFOL N/A 06/10/2015   Procedure: ESOPHAGOGASTRODUODENOSCOPY (EGD) WITH PROPOFOL;  Surgeon: Scot Jun, MD;  Location: [facility] ENDOSCOPY;  Service: Endoscopy;  Laterality: N/A;   HERNIA REPAIR     PACEMAKER IMPLANT N/A 05/05/2021   Procedure: PACEMAKER IMPLANT;  Surgeon: [name], MD;  Location: [facility] INVASIVE CV LAB;  Service: Cardiovascular;  Laterality: N/A;   SAVORY DILATION N/A 06/10/2015   Procedure: SAVORY DILATION;  Surgeon: Scot Jun, MD;  Location: [facility] ENDOSCOPY;  Service: Endoscopy;  Laterality: N/A;   TONSILLECTOMY      Home Medications:  Allergies as of 08/12/2022       Reactions   Prednisone    "they think threw me into A-fib"   Terazosin Other (See Comments)   Muscle Pain        Medication List        Accurate as of August 12, 2022  1:05 PM. If you have any questions, ask your nurse or doctor.          atorvastatin 40 MG tablet Commonly known as: LIPITOR TAKE 1 TABLET BY MOUTH EVERY MORNING   betamethasone dipropionate 0.05 % cream Apply topically 2 (two) times daily. Started by: Michiel Cowboy, PA-C   [employer] Fiber Gummies  Chew Chew by mouth.   cyanocobalamin 500 MCG tablet Commonly known as: VITAMIN B12 Take 500 mcg by mouth 2 (two) times daily.   diltiazem 30 MG tablet Commonly known as: CARDIZEM TAKE ONE TABLET EVERY FOUR HOURS AS NEEDED FOR AFIB HEART RATE >100   Eliquis 5 MG Tabs tablet Generic drug: apixaban TAKE 1 TABLET BY MOUTH TWICE DAILY   ferrous sulfate 325 (65 FE) MG tablet Take 325 mg by mouth daily with breakfast.   finasteride 5 MG tablet Commonly known as: PROSCAR Take 5 mg by mouth daily.   gabapentin 300 MG capsule Commonly known as: NEURONTIN Take 300 mg by mouth at bedtime.   ipratropium 0.03 % nasal spray Commonly known as: ATROVENT 1 spray as needed.   losartan 25 MG tablet Commonly known as: COZAAR TAKE 1  TABLET BY MOUTH DAILY   LUBRICATING EYE DROPS OP Place 1 drop into both eyes daily as needed (dry eyes).   metoprolol succinate 25 MG 24 hr tablet Commonly known as: TOPROL-XL Take 25 mg by mouth 2 (two) times daily at 10 AM and 5 PM.   multivitamin tablet Take 1 tablet by mouth daily.   nitroGLYCERIN 0.4 MG SL tablet Commonly known as: NITROSTAT Place 1 nitroglycerin under your tongue, while sitting. If no relief of pain, may repeat 1 tablet every 5 minutes up to 3 tablets over 15 minutes.   nystatin-triamcinolone ointment Commonly known as: MYCOLOG Apply 1 Application topically 2 (two) times daily.   pantoprazole 40 MG tablet Commonly known as: PROTONIX TAKE 1 TABLET BY MOUTH NIGHTLY   PreserVision AREDS 2 Caps Take 1 capsule by mouth 2 (two) times daily.   VITAMIN D3 PO Take 1 capsule by mouth daily.        Allergies:  Allergies  Allergen Reactions   Prednisone     "they think threw me into A-fib"   Terazosin Other (See Comments)    Muscle Pain    Family History: Family History  Problem Relation Age of Onset   Stroke Mother    Heart attack Mother    Hypertension Mother    Stroke Other        family history    Social History:  reports that he has never smoked. He has never used smokeless tobacco. He reports current alcohol use of about 1.0 standard drink of alcohol per week. He reports that he does not use drugs.  ROS: Pertinent ROS in HPI  Physical Exam: BP (!) 144/85   Pulse 88   Ht 5\' 10"  (1.778 m)   Wt 204 lb (92.5 kg)   BMI 29.27 kg/m   Constitutional:  Well nourished. Alert and oriented, No acute distress. HEENT: [place] AT, moist mucus membranes.  Trachea midline, Cardiovascular: No clubbing, cyanosis, or edema. Respiratory: Normal respiratory effort, no increased work of breathing. GI: Abdomen is soft, non tender, non distended, no abdominal masses. Liver and spleen not palpable.  No hernias appreciated.  Stool sample for occult testing is not  indicated.   GU: No CVA tenderness.  No bladder fullness or masses.  Patient with uncircumcised phallus. Foreskin not able to be retracted  No penile discharge. No penile lesions or rashes.  Neurologic: Grossly intact, no focal deficits, moving all 4 extremities. Psychiatric: Normal mood and affect.  Laboratory Data: Lab Results  Component Value Date   WBC 6.0 01/21/2022   HGB 13.0 01/21/2022   HCT 38.6 (L) 01/21/2022   MCV 93.9 01/21/2022   PLT 236 01/21/2022  Lab Results  Component Value Date   CREATININE 1.49 (H) 01/21/2022    Lab Results  Component Value Date   HGBA1C 5.6 01/09/2019    Lab Results  Component Value Date   TSH 2.990 01/09/2019       Component Value Date/Time   CHOL 115 10/08/2012 0554   HDL 43 10/08/2012 0554   VLDL 15 10/08/2012 0554   LDLCALC 57 10/08/2012 0554    Lab Results  Component Value Date   AST 23 01/24/2020   Lab Results  Component Value Date   ALT 17 01/24/2020    Urinalysis    Component Value Date/Time   COLORURINE YELLOW 01/21/2022 0757   APPEARANCEUR HAZY (A) 01/21/2022 0757   LABSPEC 1.014 01/21/2022 0757   PHURINE 5.0 01/21/2022 0757   GLUCOSEU NEGATIVE 01/21/2022 0757   HGBUR NEGATIVE 01/21/2022 0757   BILIRUBINUR NEGATIVE 01/21/2022 0757   KETONESUR NEGATIVE 01/21/2022 0757   PROTEINUR NEGATIVE 01/21/2022 0757   NITRITE NEGATIVE 01/21/2022 0757   LEUKOCYTESUR TRACE (A) 01/21/2022 0757  I have reviewed the labs.   Pertinent Imaging: N/A  I have independently reviewed the films.    Assessment & Plan:    1. Phimosis -He is interested in having a circumcision, but he is going to be evaluated for his neck pain and arm pain and get that under control first -I prescribed Diprolene cream to be applied twice daily in the meantime  Return in about 2 weeks (around 08/26/2022) for recheck .  These notes generated with voice recognition software. I apologize for typographical errors.  Cloretta Ned  [facility] [address]  [address] [place], [place] [address] [phone]

## 2022-08-12 ENCOUNTER — Encounter: Payer: Self-pay | Admitting: Urology

## 2022-08-12 ENCOUNTER — Ambulatory Visit (INDEPENDENT_AMBULATORY_CARE_PROVIDER_SITE_OTHER): Payer: PPO | Admitting: Urology

## 2022-08-12 VITALS — BP 144/85 | HR 88 | Ht 70.0 in | Wt 204.0 lb

## 2022-08-12 DIAGNOSIS — F1090 Alcohol use, unspecified, uncomplicated: Secondary | ICD-10-CM | POA: Diagnosis not present

## 2022-08-12 DIAGNOSIS — M47812 Spondylosis without myelopathy or radiculopathy, cervical region: Secondary | ICD-10-CM | POA: Diagnosis not present

## 2022-08-12 DIAGNOSIS — Z7982 Long term (current) use of aspirin: Secondary | ICD-10-CM | POA: Diagnosis not present

## 2022-08-12 DIAGNOSIS — Z79899 Other long term (current) drug therapy: Secondary | ICD-10-CM | POA: Diagnosis not present

## 2022-08-12 DIAGNOSIS — I4891 Unspecified atrial fibrillation: Secondary | ICD-10-CM | POA: Diagnosis not present

## 2022-08-12 DIAGNOSIS — M1990 Unspecified osteoarthritis, unspecified site: Secondary | ICD-10-CM | POA: Diagnosis not present

## 2022-08-12 DIAGNOSIS — N471 Phimosis: Secondary | ICD-10-CM | POA: Diagnosis not present

## 2022-08-12 DIAGNOSIS — Z7901 Long term (current) use of anticoagulants: Secondary | ICD-10-CM | POA: Diagnosis not present

## 2022-08-12 DIAGNOSIS — Z950 Presence of cardiac pacemaker: Secondary | ICD-10-CM | POA: Diagnosis not present

## 2022-08-12 DIAGNOSIS — N400 Enlarged prostate without lower urinary tract symptoms: Secondary | ICD-10-CM | POA: Diagnosis not present

## 2022-08-12 DIAGNOSIS — M546 Pain in thoracic spine: Secondary | ICD-10-CM | POA: Diagnosis not present

## 2022-08-12 DIAGNOSIS — M79631 Pain in right forearm: Secondary | ICD-10-CM | POA: Diagnosis not present

## 2022-08-12 DIAGNOSIS — I2510 Atherosclerotic heart disease of native coronary artery without angina pectoris: Secondary | ICD-10-CM | POA: Diagnosis not present

## 2022-08-12 DIAGNOSIS — E785 Hyperlipidemia, unspecified: Secondary | ICD-10-CM | POA: Diagnosis not present

## 2022-08-12 DIAGNOSIS — I10 Essential (primary) hypertension: Secondary | ICD-10-CM | POA: Diagnosis not present

## 2022-08-12 MED ORDER — BETAMETHASONE DIPROPIONATE 0.05 % EX CREA: TOPICAL_CREAM | Freq: Two times a day (BID) | CUTANEOUS | 0 refills | Status: DC

## 2022-08-13 DIAGNOSIS — M546 Pain in thoracic spine: Secondary | ICD-10-CM | POA: Diagnosis not present

## 2022-08-17 DIAGNOSIS — I480 Paroxysmal atrial fibrillation: Secondary | ICD-10-CM | POA: Diagnosis not present

## 2022-08-17 DIAGNOSIS — Z79899 Other long term (current) drug therapy: Secondary | ICD-10-CM | POA: Diagnosis not present

## 2022-08-17 DIAGNOSIS — M5412 Radiculopathy, cervical region: Secondary | ICD-10-CM | POA: Diagnosis not present

## 2022-08-17 DIAGNOSIS — I5032 Chronic diastolic (congestive) heart failure: Secondary | ICD-10-CM | POA: Diagnosis not present

## 2022-08-17 DIAGNOSIS — N1831 Chronic kidney disease, stage 3a: Secondary | ICD-10-CM | POA: Diagnosis not present

## 2022-08-17 DIAGNOSIS — Z7901 Long term (current) use of anticoagulants: Secondary | ICD-10-CM | POA: Diagnosis not present

## 2022-08-19 ENCOUNTER — Other Ambulatory Visit: Payer: Self-pay | Admitting: Physician Assistant

## 2022-08-25 DIAGNOSIS — H353122 Nonexudative age-related macular degeneration, left eye, intermediate dry stage: Secondary | ICD-10-CM | POA: Diagnosis not present

## 2022-08-26 ENCOUNTER — Ambulatory Visit: Payer: PPO | Admitting: Urology

## 2022-08-27 NOTE — Progress Notes (Signed)
Remote pacemaker transmission.   

## 2022-08-31 NOTE — Progress Notes (Unsigned)
09/01/2022 12:38 PM   Benjamin Brewer [birth year] [id number]  Referring provider: Derinda Late, MD [phone] [address] [facility] [place],  [place] [address]  Urological history: 1. BPH -PSA (05/2020) 0.86  Chief Complaint  Patient presents with   Follow-up    HPI: Benjamin Brewer is a 89 y.o. male who presents today for 6 weeks after a trial of Mycolog BID for phimosis.   At his visit on 08/12/2022, he states that the Mycolog cream did not really change his situation.  He started experiencing excruciating neck pain that is radiating to his right arm over the weekend.  He was seen in urgent care for this and prescribed some medicine, but it is to continue to get worse.  He is going to seek care in the emergency department when he leaves here.  He was given a script for Diprolene.   He was seen in the [facility] ED and has follow up with his PCP for cervical radiculopathy.    He stated the Diprolene cream made no difference and he would like to proceed with a Dorsal slit.    He also has issues with nocturia x 2-3 nightly.  He does admit though that he drinks a large amount of fluid during the day.  Patient denies any modifying or aggravating factors.  Patient denies any gross hematuria, dysuria or suprapubic/flank pain.  Patient denies any fevers, chills, nausea or vomiting.    PMH: Past Medical History:  Diagnosis Date   A-fib [facility])    Arthritis    hands   Benign prostatic hypertrophy    Hx of    CAD (coronary artery disease)    native vessel. Status post LAD and RCA angioplasty and drug-eluting stent placement in 2009   Dental bridge present    implants, no bridge   Dizziness    Dyslipidemia    GERD (gastroesophageal reflux disease)    History of tachycardia    Hyperlipidemia    Hypertension    Intermediate coronary syndrome (HCC)    angina, unstable   Numbness and tingling of both lower extremities    Other dyspnea and  respiratory abnormality    on exertion   Pinched nerve in neck    some discomfort with side to side movement   Vertigo    approx 1x/month    Surgical History: Past Surgical History:  Procedure Laterality Date   [place]; x2 (last stent 2009)   [place] W/PHACO Left 07/03/2019   Procedure: CATARACT EXTRACTION PHACO AND INTRAOCULAR LENS PLACEMENT ([place]) LEFT 10.19  01:06.9;  Surgeon: Eulogio Bear, MD;  Location: [facility];  Service: Ophthalmology;  Laterality: Left;   ESOPHAGOGASTRODUODENOSCOPY (EGD) WITH PROPOFOL N/A 06/10/2015   Procedure: ESOPHAGOGASTRODUODENOSCOPY (EGD) WITH PROPOFOL;  Surgeon: Manya Silvas, MD;  Location: [facility] ENDOSCOPY;  Service: Endoscopy;  Laterality: N/A;   HERNIA REPAIR     PACEMAKER IMPLANT N/A 05/05/2021   Procedure: PACEMAKER IMPLANT;  Surgeon: Vickie Epley, MD;  Location: [facility] CV LAB;  Service: Cardiovascular;  Laterality: N/A;   SAVORY DILATION N/A 06/10/2015   Procedure: SAVORY DILATION;  Surgeon: Manya Silvas, MD;  Location: [facility] ENDOSCOPY;  Service: Endoscopy;  Laterality: N/A;   TONSILLECTOMY      Home Medications:  Allergies as of 09/01/2022  Reactions   Prednisone    "they think threw me into A-fib"   Terazosin Other (See Comments)   Muscle Pain        Medication List        Accurate as of September 01, 2022 12:38 PM. If you have any questions, ask your nurse or doctor.          atorvastatin 40 MG tablet Commonly known as: LIPITOR TAKE 1 TABLET BY MOUTH EVERY MORNING   betamethasone dipropionate 0.05 % cream Apply topically 2 (two) times daily.   [employer] Fiber Gummies Chew Chew by mouth.   cyanocobalamin 500 MCG tablet Commonly known as: VITAMIN B12 Take 500 mcg by mouth 2 (two) times daily.   diltiazem 30 MG tablet Commonly known as: CARDIZEM TAKE ONE TABLET  EVERY FOUR HOURS AS NEEDED FOR AFIB HEART RATE >100   Eliquis 5 MG Tabs tablet Generic drug: apixaban TAKE 1 TABLET BY MOUTH TWICE DAILY   ferrous sulfate 325 (65 FE) MG tablet Take 325 mg by mouth daily with breakfast.   finasteride 5 MG tablet Commonly known as: PROSCAR Take 5 mg by mouth daily.   gabapentin 300 MG capsule Commonly known as: NEURONTIN Take 300 mg by mouth at bedtime.   gabapentin 300 MG capsule Commonly known as: NEURONTIN Take 1 capsule by mouth 3 (three) times daily.   ipratropium 0.03 % nasal spray Commonly known as: ATROVENT 1 spray as needed.   losartan 25 MG tablet Commonly known as: COZAAR TAKE 1 TABLET BY MOUTH DAILY   LUBRICATING EYE DROPS OP Place 1 drop into both eyes daily as needed (dry eyes).   metoprolol succinate 25 MG 24 hr tablet Commonly known as: TOPROL-XL Take 25 mg by mouth 2 (two) times daily at 10 AM and 5 PM.   multivitamin tablet Take 1 tablet by mouth daily.   nitroGLYCERIN 0.4 MG SL tablet Commonly known as: NITROSTAT Place 1 nitroglycerin under your tongue, while sitting. If no relief of pain, may repeat 1 tablet every 5 minutes up to 3 tablets over 15 minutes.   nystatin-triamcinolone ointment Commonly known as: MYCOLOG Apply 1 Application topically 2 (two) times daily.   pantoprazole 40 MG tablet Commonly known as: PROTONIX TAKE 1 TABLET BY MOUTH NIGHTLY   PreserVision AREDS 2 Caps Take 1 capsule by mouth 2 (two) times daily.   traMADol 50 MG tablet Commonly known as: ULTRAM Take 50 mg by mouth every 6 (six) hours as needed.   VITAMIN D3 PO Take 1 capsule by mouth daily.        Allergies:  Allergies  Allergen Reactions   Prednisone     "they think threw me into A-fib"   Terazosin Other (See Comments)    Muscle Pain    Family History: Family History  Problem Relation Age of Onset   Stroke Mother    Heart attack Mother    Hypertension Mother    Stroke Other        family history     Social History:  reports that he has never smoked. He has never used smokeless tobacco. He reports current alcohol use of about 1.0 standard drink of alcohol per week. He reports that he does not use drugs.  ROS: Pertinent ROS in HPI  Physical Exam: BP (!) 178/105   Pulse 91   Ht 5' 10"$  (1.778 m)   Wt 205 lb (93 kg)   BMI 29.41 kg/m   Constitutional:  Well nourished. Alert and oriented,  No acute distress. HEENT: [place] AT, moist mucus membranes.  Trachea midline Cardiovascular: No clubbing, cyanosis, or edema. Respiratory: Normal respiratory effort, no increased work of breathing. Neurologic: Grossly intact, no focal deficits, moving all 4 extremities. Psychiatric: Normal mood and affect.   Laboratory Data: Serum creatinine (07/2022) 1.23 I have reviewed the labs.   Pertinent Imaging: N/A  I have independently reviewed the films.    Assessment & Plan:    1. Phimosis -no improvement with Diprolene cream -discussed Dorsal Slit vs circumcision  -he would like to have the dorsal slit -Reviewed the risk of a dorsal slit such as infection or bleeding, penile sensitivity and dissatisfaction with cosmetic results -He stated being 89 years of age, he is not concerned about cosmetic results is much as functionality -Will obtain cardiac clearance to stop his anticoagulants -orders sent   Return for dorsal slit in the ED .  These notes generated with voice recognition software. I apologize for typographical errors.  [place], [place] [address]  [place], [place] [address] [phone]

## 2022-09-01 ENCOUNTER — Encounter: Payer: Self-pay | Admitting: Urology

## 2022-09-01 ENCOUNTER — Other Ambulatory Visit: Payer: Self-pay | Admitting: Urology

## 2022-09-01 ENCOUNTER — Ambulatory Visit (INDEPENDENT_AMBULATORY_CARE_PROVIDER_SITE_OTHER): Payer: PPO | Admitting: Urology

## 2022-09-01 VITALS — BP 178/105 | HR 91 | Ht 70.0 in | Wt 205.0 lb

## 2022-09-01 DIAGNOSIS — N471 Phimosis: Secondary | ICD-10-CM | POA: Diagnosis not present

## 2022-09-01 NOTE — Progress Notes (Unsigned)
Surgical Physician Order Form [facility] [place]  * Scheduling expectation : Next Available w/ Dr. Erlene Quan   *Length of Case:   *Clearance needed: yes- cardiologist is Dr. Fletcher Anon   *Anticoagulation Instructions: Hold all anticoagulants  *Aspirin Instructions: Hold Aspirin  *Post-op visit Date/Instructions:  1 month follow up  *Diagnosis: Phimosis  *Procedure:     Dorsal Slit ([id number])   Additional orders: N/A  -Admit type: OUTpatient  -Anesthesia: General  -VTE Prophylaxis Standing Order SCD's       Other:   -Standing Lab Orders Per Anesthesia    Lab other: None  -Standing Test orders EKG/Chest x-ray per Anesthesia       Test other:   - Medications:  Ancef 2gm IV  -Other orders:  N/A

## 2022-09-02 ENCOUNTER — Telehealth: Payer: Self-pay

## 2022-09-02 ENCOUNTER — Encounter: Payer: Self-pay | Admitting: Cardiology

## 2022-09-02 ENCOUNTER — Telehealth: Payer: Self-pay | Admitting: Urgent Care

## 2022-09-02 ENCOUNTER — Telehealth: Payer: Self-pay | Admitting: *Deleted

## 2022-09-02 MED ORDER — NITROGLYCERIN 0.4 MG SL SUBL: SUBLINGUAL_TABLET | SUBLINGUAL | 3 refills | Status: DC

## 2022-09-02 NOTE — Telephone Encounter (Signed)
Patient with diagnosis of atrial fibriliation on Eliquis for anticoagulation.    Procedure: Dorsal slit Date of procedure: 09/28/2022   CHA2DS2-VASc Score = 4   This indicates a 4.8% annual risk of stroke. The patient's score is based upon: CHF History: 0 HTN History: 1 Diabetes History: 0 Stroke History: 0 Vascular Disease History: 1 Age Score: 2 Gender Score: 0    CrCl 47 mL/min (SrCr. 1.23 08/12/2022) Platelet count 278 K   Per office protocol, patient can hold Eliquis for 2-3 days prior to procedure.    **This guidance is not considered finalized until pre-operative APP has relayed final recommendations.**

## 2022-09-02 NOTE — Telephone Encounter (Signed)
Primary Cardiologist:Muhammad Fletcher Anon, MD   Preoperative team, please contact this patient and set up a phone call appointment for further preoperative risk assessment. Please obtain consent and complete medication review. Thank you for your help.   Per office protocol, patient can hold Eliquis for 2-3 days prior to procedure.   Emmaline Life, NP-C  09/02/2022, 3:44 PM [address] Office [phone] Fax [phone]

## 2022-09-02 NOTE — Telephone Encounter (Signed)
  Patient Consent for Virtual Visit         Benjamin Brewer has provided verbal consent on 09/02/2022 for a virtual visit (video or telephone).   CONSENT FOR VIRTUAL VISIT FOR:  Benjamin Brewer  By participating in this virtual visit I agree to the following:  I hereby voluntarily request, consent and authorize [facility] and its employed or contracted physicians, physician assistants, nurse practitioners or other licensed health care professionals (the Practitioner), to provide me with telemedicine health care services (the "Services") as deemed necessary by the treating Practitioner. I acknowledge and consent to receive the Services by the Practitioner via telemedicine. I understand that the telemedicine visit will involve communicating with the Practitioner through live audiovisual communication technology and the disclosure of certain medical information by electronic transmission. I acknowledge that I have been given the opportunity to request an in-person assessment or other available alternative prior to the telemedicine visit and am voluntarily participating in the telemedicine visit.  I understand that I have the right to withhold or withdraw my consent to the use of telemedicine in the course of my care at any time, without affecting my right to future care or treatment, and that the Practitioner or I may terminate the telemedicine visit at any time. I understand that I have the right to inspect all information obtained and/or recorded in the course of the telemedicine visit and may receive copies of available information for a reasonable fee.  I understand that some of the potential risks of receiving the Services via telemedicine include:  Delay or interruption in medical evaluation due to technological equipment failure or disruption; Information transmitted may not be sufficient (e.g. poor resolution of images) to allow for appropriate medical decision making by the  Practitioner; and/or  In rare instances, security protocols could fail, causing a breach of personal health information.  Furthermore, I acknowledge that it is my responsibility to provide information about my medical history, conditions and care that is complete and accurate to the best of my ability. I acknowledge that Practitioner's advice, recommendations, and/or decision may be based on factors not within their control, such as incomplete or inaccurate data provided by me or distortions of diagnostic images or specimens that may result from electronic transmissions. I understand that the practice of medicine is not an exact science and that Practitioner makes no warranties or guarantees regarding treatment outcomes. I acknowledge that a copy of this consent can be made available to me via my patient portal ([facility]), or I can request a printed copy by calling the office of [facility].    I understand that my insurance will be billed for this visit.   I have read or had this consent read to me. I understand the contents of this consent, which adequately explains the benefits and risks of the Services being provided via telemedicine.  I have been provided ample opportunity to ask questions regarding this consent and the Services and have had my questions answered to my satisfaction. I give my informed consent for the services to be provided through the use of telemedicine in my medical care

## 2022-09-02 NOTE — Progress Notes (Addendum)
   [facility] From  Surgery Date: Date: 11/30/2022  Surgeon: Dr. Vanna Scotland, MD  Inpt ( No  )   Outpt (Yes)   Obs ( No  )   Diagnosis: N47.1 Phimosis  -CPT: 54001  Surgery: Dorsal Slit  Stop Anticoagulations: Yes hold Eliquis and ASA  Cardiac/Medical/Pulmonary Clearance needed: yes  Clearance needed from Dr: Lalla Brothers and Dr. Kirke Corin  Clearance request sent on: Date: 09/02/22 by Quentin Mulling NP with Perioperative Services, also sent for pacemaker form  *Orders entered into EPIC  Date: 09/02/22   *Case booked in EPIC  Date: 09/02/22  *Notified pt of Surgery: Date: 09/02/22  PRE-OP UA & CX: no  *Placed into Prior Authorization Work [place] Date: 09/02/22  Assistant/laser/rep:No

## 2022-09-02 NOTE — Progress Notes (Signed)
[facility] DEVICE PROGRAMMING  Patient Information: Name:  Benjamin Brewer  DOB:  January 03, 1931  MRN:  [id number]  Planned Procedure: DORSAL SLIT   Surgeon:  Dr. Hollice Espy, MD  Requesting device clearance: Honor Loh, FNP-C  Date of Procedure:  09/28/2022  Cautery will be used.   Device Information:  Clinic EP Physician:  Lars Mage, MD  Device Type:  Pacemaker Manufacturer and Phone #:  Medtronic: [id number] Pacemaker Dependent?:  No. Date of Last Device Check:  08/06/2022 Normal Device Function?:  Yes.    Electrophysiologist's Recommendations:  Have magnet available. Provide continuous ECG monitoring when magnet is used or reprogramming is to be performed.  Procedure should not interfere with device function.  No device programming or magnet placement needed.  Per Device Clinic Standing Orders, Damian Leavell, RN  1:22 PM 09/02/2022

## 2022-09-02 NOTE — Progress Notes (Signed)
  Perioperative Services Pre-Admission/Anesthesia Testing     Date: 09/02/22  Name: Benjamin Brewer MRN:   [id number]  Re: Request from surgery for clearance prior to scheduled procedure  Patient is scheduled to undergo a DORSAL SLIT  on 09/28/2022 with Dr. Hollice Espy, MD. Patient has not been scheduled for his PAT appointment at this point, thus has not undergone review by PAT RN and/or APP. Received communication from primary attending surgeon's office requesting that patient be submitted for clearance from cardiology.   PROVIDER SPECIALTY FAXED TO   Kathlyn Sacramento, MD  Cardiology Routed to APP pool for review   Plan:  Patient also has an implanted cardiac device. Clearance documents (medical and device) generated and routed to appropriate provider(s) as noted above. Note will be updated to reflect communication with provider's office as it relates to clearance being provided and/or the need for office visit prior to clearance for surgery being issued.   Honor Loh, MSN, APRN, FNP-C, CEN [facility]  Peri-operative Services Nurse Practitioner Phone: [phone] 09/02/22 10:28 AM  NOTE: This note has been prepared using Dragon dictation software. Despite my best ability to proofread, there is always the potential that unintentional transcriptional errors may still occur from this process.

## 2022-09-02 NOTE — Telephone Encounter (Signed)
-----   Message from Karen Kitchens, NP sent at 09/02/2022 10:26 AM EST ----- Regarding: Request for pre-operative cardiac clearance Request for pre-operative cardiac clearance:  1. What type of surgery is being performed?  DORSAL SLIT  2. When is this surgery scheduled?  09/28/2022  3. Type of clearance being requested (medical, pharmacy, both)? BOTH   4. Are there any medications that need to be held prior to surgery? APIXABAN  5. Practice name and name of physician performing surgery?  Performing surgeon: Dr. Hollice Espy, MD Requesting clearance: Honor Loh, FNP-C    6. Anesthesia type (none, local, MAC, general)? GENERAL  7. What is the office phone and fax number?   Phone: [phone] Fax: [phone]  ATTENTION: Unable to create telephone message as per your standard workflow. Directed by [facility] providers to send requests for cardiac clearance to this pool for appropriate distribution to provider covering pre-operative clearances.   Honor Loh, MSN, APRN, FNP-C, CEN [facility]  Peri-operative Services Nurse Practitioner Phone: [phone] 09/02/22 10:26 AM

## 2022-09-02 NOTE — Telephone Encounter (Signed)
error 

## 2022-09-02 NOTE — Telephone Encounter (Signed)
I spoke with Benjamin Brewer . We have discussed possible surgery dates and Monday March 18th, 2024 was agreed upon by all parties. Patient given information about surgery date, what to expect pre-operatively and post operatively.  We discussed that a Pre-Admission Testing office will be calling to set up the pre-op visit that will take place prior to surgery, and that these appointments are typically done over the phone with a Pre-Admissions RN. Informed patient that our office will communicate any additional care to be provided after surgery. Patients questions or concerns were discussed during our call. Advised to call our office should there be any additional information, questions or concerns that arise. Patient verbalized understanding.

## 2022-09-08 ENCOUNTER — Telehealth: Payer: Self-pay

## 2022-09-08 NOTE — Telephone Encounter (Signed)
Patient called in and stated that he is not ready to have surgery currently and would like to cancel his surgery on 09/28/2022. States that he has a pinched nerve in his neck and this surgery we have scheduled is not urgent.

## 2022-09-09 ENCOUNTER — Ambulatory Visit: Payer: PPO | Attending: Interventional Cardiology | Admitting: Physician Assistant

## 2022-09-09 DIAGNOSIS — Z01810 Encounter for preprocedural cardiovascular examination: Secondary | ICD-10-CM

## 2022-09-09 NOTE — Progress Notes (Signed)
Upon initiation of the phone call this afternoon, I was informed that he has canceled his surgery.   Virtual Visit via Telephone Note   Because of Benjamin Brewer's co-morbid illnesses, he is at least at moderate risk for complications without adequate follow up.  This format is felt to be most appropriate for this patient at this time.  The patient did not have access to video technology/had technical difficulties with video requiring transitioning to audio format only (telephone).  All issues noted in this document were discussed and addressed.  No physical exam could be performed with this format.  Please refer to the patient's chart for his consent to telehealth for [facility].  Evaluation Performed:  Preoperative cardiovascular risk assessment _____________   Date:  09/09/2022   Patient ID:  Benjamin Brewer, DOB 1930/08/25, MRN [id number] Patient Location:  Home Provider location:   Office  Primary Care Provider:  Derinda Late, MD Primary Cardiologist:  Kathlyn Sacramento, MD  Chief Complaint / Patient Profile   89 y.o. y/o male with a h/o CAD s/p DES-LAD, DES-RCA, PAF on eliquis, SSS s/p PPM, CKD, HTN, HLD, and cervical spine disease who is pending DORSAL SLIT and presents today for telephonic preoperative cardiovascular risk assessment.  History of Present Illness    Benjamin Brewer is a 89 y.o. male who presents via audio/video conferencing for a telehealth visit today.  Pt was last seen in cardiology clinic on 06/16/22 by Christell Faith PAC.  At that time Benjamin Brewer was doing well.  The patient is now pending procedure as outlined above.   Past Medical History    Past Medical History:  Diagnosis Date   A-fib [facility])    Arthritis    hands   Benign prostatic hypertrophy    Hx of    CAD (coronary artery disease)    native vessel. Status post LAD and RCA angioplasty and drug-eluting stent placement in 2009   Dental bridge present    implants, no bridge   Dizziness     Dyslipidemia    GERD (gastroesophageal reflux disease)    History of tachycardia    Hyperlipidemia    Hypertension    Intermediate coronary syndrome (HCC)    angina, unstable   Numbness and tingling of both lower extremities    Other dyspnea and respiratory abnormality    on exertion   Pinched nerve in neck    some discomfort with side to side movement   Vertigo    approx 1x/month   Past Surgical History:  Procedure Laterality Date   [place]; x2 (last stent 2009)   [place] W/PHACO Left 07/03/2019   Procedure: CATARACT EXTRACTION PHACO AND INTRAOCULAR LENS PLACEMENT ([place]) LEFT 10.19  01:06.9;  Surgeon: Eulogio Bear, MD;  Location: [facility];  Service: Ophthalmology;  Laterality: Left;   ESOPHAGOGASTRODUODENOSCOPY (EGD) WITH PROPOFOL N/A 06/10/2015   Procedure: ESOPHAGOGASTRODUODENOSCOPY (EGD) WITH PROPOFOL;  Surgeon: Manya Silvas, MD;  Location: [facility] ENDOSCOPY;  Service: Endoscopy;  Laterality: N/A;   HERNIA REPAIR     PACEMAKER IMPLANT N/A 05/05/2021   Procedure: PACEMAKER IMPLANT;  Surgeon: Vickie Epley, MD;  Location: [facility] CV LAB;  Service: Cardiovascular;  Laterality: N/A;   SAVORY DILATION N/A 06/10/2015   Procedure: SAVORY DILATION;  Surgeon: Manya Silvas, MD;  Location: [facility] ENDOSCOPY;  Service: Endoscopy;  Laterality: N/A;   TONSILLECTOMY      Allergies  Allergies  Allergen Reactions   Prednisone     "they think threw me into A-fib"   Terazosin Other (See Comments)    Muscle Pain    Home Medications    Prior to Admission medications   Medication Sig Start Date End Date Taking? Authorizing Provider  atorvastatin (LIPITOR) 40 MG tablet TAKE 1 TABLET BY MOUTH EVERY MORNING 08/19/22   Dunn, Areta Haber, PA-C  Carboxymethylcellul-Glycerin (LUBRICATING EYE DROPS OP) Place 1 drop into both eyes daily as needed (dry  eyes).    [provider]  Cholecalciferol (VITAMIN D3 PO) Take 1 capsule by mouth daily.    [provider]  cyanocobalamin 500 MCG tablet Take 500 mcg by mouth 2 (two) times daily.    [provider]  diltiazem (CARDIZEM) 30 MG tablet TAKE ONE TABLET EVERY FOUR HOURS AS NEEDED FOR AFIB HEART RATE >100 03/17/22   Dunn, Areta Haber, PA-C  ELIQUIS 5 MG TABS tablet TAKE 1 TABLET BY MOUTH TWICE DAILY 01/31/18   Rise Mu, PA-C  ferrous sulfate 325 (65 FE) MG tablet Take 325 mg by mouth daily with breakfast.    [provider]  Fiber-Vitamins-Minerals ([employer] FIBER GUMMIES) CHEW Chew by mouth.    [provider]  finasteride (PROSCAR) 5 MG tablet Take 5 mg by mouth daily. 09/28/18   [provider]  ipratropium (ATROVENT) 0.03 % nasal spray 1 spray as needed. 03/20/22   [provider]  losartan (COZAAR) 25 MG tablet TAKE 1 TABLET BY MOUTH DAILY 03/23/22   Wellington Hampshire, MD  metoprolol succinate (TOPROL-XL) 25 MG 24 hr tablet Take 25 mg by mouth 2 (two) times daily at 10 AM and 5 PM.    [provider]  Multiple Vitamin (MULTIVITAMIN) tablet Take 1 tablet by mouth daily.    [provider]  Multiple Vitamins-Minerals (PRESERVISION AREDS 2) CAPS Take 1 capsule by mouth 2 (two) times daily.    [provider]  nitroGLYCERIN (NITROSTAT) 0.4 MG SL tablet Place 1 nitroglycerin under your tongue, while sitting. If no relief of pain, may repeat 1 tablet every 5 minutes up to 3 tablets over 15 minutes. 09/02/22   Wellington Hampshire, MD  pantoprazole (PROTONIX) 40 MG tablet TAKE 1 TABLET BY MOUTH NIGHTLY 04/02/22   Wellington Hampshire, MD  traMADol (ULTRAM) 50 MG tablet Take 50 mg by mouth every 6 (six) hours as needed. 08/25/22   [provider]    Physical Exam    Vital Signs:  [name] does not have vital signs available for review today.  Given telephonic nature of communication, physical exam is  limited. AAOx3. NAD. Normal affect.  Speech and respirations are unlabored.  Accessory Clinical Findings    None  Assessment & Plan    1.  Preoperative Cardiovascular Risk Assessment:  Upon initiation of the phone call this afternoon at her scheduled time, the patient informed me that he has canceled his surgery.  I asked him to let us know when this is rescheduled and we will set up his virtual visit.  A copy of this note will be routed to requesting surgeon.  Time:   Today, I have spent 5 minutes with the patient with telehealth technology discussing medical history, symptoms, and management plan.     [place], PA  09/09/2022, 2:08 PM

## 2022-09-17 ENCOUNTER — Telehealth: Payer: Self-pay | Admitting: Cardiovascular Disease

## 2022-09-17 DIAGNOSIS — M5030 Other cervical disc degeneration, unspecified cervical region: Secondary | ICD-10-CM | POA: Diagnosis not present

## 2022-09-17 DIAGNOSIS — M5412 Radiculopathy, cervical region: Secondary | ICD-10-CM | POA: Diagnosis not present

## 2022-09-17 NOTE — Telephone Encounter (Signed)
   Pre-operative Risk Assessment    Patient Name: Benjamin Brewer  DOB: 06-28-1931 MRN: [id number]      Request for Surgical Clearance    Procedure:   cervical ESI  Date of Surgery:  Clearance 09/29/22                                 Surgeon:  Dr Margette Fast Surgeon's Group or Practice Name:  [facility] Phone number:  [phone] Fax number:  [phone]   Type of Clearance Requested:   - Pharmacy:  Hold Apixaban (Eliquis) 72 hours prior   Type of Anesthesia:  Not Indicated   Additional requests/questions:    [name]   09/17/2022, 2:06 PM

## 2022-09-17 NOTE — Telephone Encounter (Signed)
   Primary Cardiologist: Kathlyn Sacramento, MD  Chart reviewed as part of pre-operative protocol coverage. Given past medical history and time since last visit, based on ACC/AHA guidelines, LAMEEK GOWER would be at acceptable risk for the planned procedure without further cardiovascular testing.   Patient was advised that if he develops new symptoms prior to surgery to contact our office to arrange a follow-up appointment. He verbalized understanding.  He will need a 3-day hold of apixaban prior to procedure.  Note, we have records that he is having dorsal slit procedure the day prior (09/28/22) for which he was advised he could hold apixaban for 2-3 days. Recommend that you notify patient to ensure 3 day hold prior to [facility].   I will route this recommendation to the requesting party via Epic fax function and remove from pre-op pool.  Please call with questions.  Emmaline Life, NP-C  09/17/2022, 2:20 PM [address] Office [phone] Fax [phone]

## 2022-09-21 ENCOUNTER — Other Ambulatory Visit: Payer: PPO

## 2022-09-22 DIAGNOSIS — M5412 Radiculopathy, cervical region: Secondary | ICD-10-CM | POA: Diagnosis not present

## 2022-09-22 DIAGNOSIS — M5030 Other cervical disc degeneration, unspecified cervical region: Secondary | ICD-10-CM | POA: Diagnosis not present

## 2022-09-22 DIAGNOSIS — M799 Soft tissue disorder, unspecified: Secondary | ICD-10-CM | POA: Diagnosis not present

## 2022-09-22 DIAGNOSIS — M6281 Muscle weakness (generalized): Secondary | ICD-10-CM | POA: Diagnosis not present

## 2022-09-28 ENCOUNTER — Ambulatory Visit: Admit: 2022-09-28 | Payer: PPO | Admitting: Urology

## 2022-09-28 DIAGNOSIS — M799 Soft tissue disorder, unspecified: Secondary | ICD-10-CM | POA: Diagnosis not present

## 2022-09-28 DIAGNOSIS — M6281 Muscle weakness (generalized): Secondary | ICD-10-CM | POA: Diagnosis not present

## 2022-09-28 DIAGNOSIS — M5030 Other cervical disc degeneration, unspecified cervical region: Secondary | ICD-10-CM | POA: Diagnosis not present

## 2022-09-28 DIAGNOSIS — M5412 Radiculopathy, cervical region: Secondary | ICD-10-CM | POA: Diagnosis not present

## 2022-09-28 SURGERY — DORSAL SLIT, PREPUCE
Anesthesia: General

## 2022-09-29 DIAGNOSIS — M4802 Spinal stenosis, cervical region: Secondary | ICD-10-CM | POA: Diagnosis not present

## 2022-09-29 DIAGNOSIS — M5412 Radiculopathy, cervical region: Secondary | ICD-10-CM | POA: Diagnosis not present

## 2022-10-07 DIAGNOSIS — H353122 Nonexudative age-related macular degeneration, left eye, intermediate dry stage: Secondary | ICD-10-CM | POA: Diagnosis not present

## 2022-10-15 DIAGNOSIS — M799 Soft tissue disorder, unspecified: Secondary | ICD-10-CM | POA: Diagnosis not present

## 2022-10-15 DIAGNOSIS — M6281 Muscle weakness (generalized): Secondary | ICD-10-CM | POA: Diagnosis not present

## 2022-10-15 DIAGNOSIS — M5412 Radiculopathy, cervical region: Secondary | ICD-10-CM | POA: Diagnosis not present

## 2022-10-20 DIAGNOSIS — M5030 Other cervical disc degeneration, unspecified cervical region: Secondary | ICD-10-CM | POA: Diagnosis not present

## 2022-10-20 DIAGNOSIS — M5412 Radiculopathy, cervical region: Secondary | ICD-10-CM | POA: Diagnosis not present

## 2022-10-20 DIAGNOSIS — Z7901 Long term (current) use of anticoagulants: Secondary | ICD-10-CM | POA: Diagnosis not present

## 2022-10-20 DIAGNOSIS — I10 Essential (primary) hypertension: Secondary | ICD-10-CM | POA: Diagnosis not present

## 2022-10-20 DIAGNOSIS — M4802 Spinal stenosis, cervical region: Secondary | ICD-10-CM | POA: Diagnosis not present

## 2022-10-20 DIAGNOSIS — N1831 Chronic kidney disease, stage 3a: Secondary | ICD-10-CM | POA: Diagnosis not present

## 2022-10-20 DIAGNOSIS — R42 Dizziness and giddiness: Secondary | ICD-10-CM | POA: Diagnosis not present

## 2022-10-20 DIAGNOSIS — R5383 Other fatigue: Secondary | ICD-10-CM | POA: Diagnosis not present

## 2022-10-20 DIAGNOSIS — I480 Paroxysmal atrial fibrillation: Secondary | ICD-10-CM | POA: Diagnosis not present

## 2022-10-22 DIAGNOSIS — M799 Soft tissue disorder, unspecified: Secondary | ICD-10-CM | POA: Diagnosis not present

## 2022-10-22 DIAGNOSIS — M5412 Radiculopathy, cervical region: Secondary | ICD-10-CM | POA: Diagnosis not present

## 2022-10-22 DIAGNOSIS — M6281 Muscle weakness (generalized): Secondary | ICD-10-CM | POA: Diagnosis not present

## 2022-10-28 ENCOUNTER — Ambulatory Visit: Payer: PPO | Admitting: Urology

## 2022-10-29 ENCOUNTER — Emergency Department: Payer: PPO

## 2022-10-29 ENCOUNTER — Emergency Department
Admission: EM | Admit: 2022-10-29 | Discharge: 2022-10-29 | Disposition: A | Discharge status: Home / Self Care | Payer: PPO | Attending: Emergency Medicine | Admitting: Emergency Medicine

## 2022-10-29 ENCOUNTER — Other Ambulatory Visit: Payer: Self-pay

## 2022-10-29 DIAGNOSIS — Z7901 Long term (current) use of anticoagulants: Secondary | ICD-10-CM | POA: Diagnosis not present

## 2022-10-29 DIAGNOSIS — R002 Palpitations: Secondary | ICD-10-CM | POA: Diagnosis not present

## 2022-10-29 DIAGNOSIS — J939 Pneumothorax, unspecified: Secondary | ICD-10-CM | POA: Diagnosis not present

## 2022-10-29 DIAGNOSIS — I480 Paroxysmal atrial fibrillation: Secondary | ICD-10-CM | POA: Insufficient documentation

## 2022-10-29 DIAGNOSIS — I499 Cardiac arrhythmia, unspecified: Secondary | ICD-10-CM | POA: Diagnosis not present

## 2022-10-29 DIAGNOSIS — I2510 Atherosclerotic heart disease of native coronary artery without angina pectoris: Secondary | ICD-10-CM | POA: Diagnosis not present

## 2022-10-29 DIAGNOSIS — R000 Tachycardia, unspecified: Secondary | ICD-10-CM | POA: Diagnosis not present

## 2022-10-29 DIAGNOSIS — I4891 Unspecified atrial fibrillation: Secondary | ICD-10-CM | POA: Diagnosis not present

## 2022-10-29 DIAGNOSIS — R079 Chest pain, unspecified: Secondary | ICD-10-CM | POA: Diagnosis not present

## 2022-10-29 LAB — BASIC METABOLIC PANEL
Anion gap: 9 (ref 5–15)
BUN: 16 mg/dL (ref 8–23)
CO2: 24 mmol/L (ref 22–32)
Calcium: 9.1 mg/dL (ref 8.9–10.3)
Chloride: 106 mmol/L (ref 98–111)
Creatinine, Ser: 1.24 mg/dL (ref 0.61–1.24)
GFR, Estimated: 56 mL/min — ABNORMAL LOW (ref >60)
Glucose, Bld: 106 mg/dL — ABNORMAL HIGH (ref 70–99)
Potassium: 3.8 mmol/L (ref 3.5–5.1)
Sodium: 139 mmol/L (ref 135–145)

## 2022-10-29 LAB — CBC
HCT: 39.2 % (ref 39.0–52.0)
Hemoglobin: 13.1 g/dL (ref 13.0–17.0)
MCH: 30.5 pg (ref 26.0–34.0)
MCHC: 33.4 g/dL (ref 30.0–36.0)
MCV: 91.4 fL (ref 80.0–100.0)
Platelets: 261 10*3/uL (ref 150–400)
RBC: 4.29 MIL/uL (ref 4.22–5.81)
RDW: 14 % (ref 11.5–15.5)
WBC: 7.5 10*3/uL (ref 4.0–10.5)
nRBC: 0 % (ref 0.0–0.2)

## 2022-10-29 LAB — TROPONIN I (HIGH SENSITIVITY): Troponin I (High Sensitivity): 21 ng/L — ABNORMAL HIGH (ref <18)

## 2022-10-29 MED ORDER — AMIODARONE HCL IN DEXTROSE 360-4.14 MG/200ML-% IV SOLN: 30.0000 mg/h | INTRAVENOUS | Status: DC

## 2022-10-29 MED ORDER — AMIODARONE HCL IN DEXTROSE 360-4.14 MG/200ML-% IV SOLN
60.0000 mg/h | INTRAVENOUS | Status: DC
  Administered 2022-10-29: 60 mg/h via INTRAVENOUS
  Filled 2022-10-29: qty 200

## 2022-10-29 MED ORDER — SODIUM CHLORIDE 0.9 % IV BOLUS
500.0000 mL | Freq: Once | INTRAVENOUS | Status: AC
  Administered 2022-10-29: 500 mL via INTRAVENOUS

## 2022-10-29 MED ORDER — AMIODARONE HCL 200 MG PO TABS: 200.0000 mg | ORAL_TABLET | Freq: Two times a day (BID) | ORAL | 0 refills | Status: DC

## 2022-10-29 MED ORDER — AMIODARONE LOAD VIA INFUSION
150.0000 mg | Freq: Once | INTRAVENOUS | Status: AC
  Administered 2022-10-29: 150 mg via INTRAVENOUS
  Filled 2022-10-29: qty 83.34

## 2022-10-29 NOTE — ED Notes (Addendum)
Amiodarone bolus given over 30 mins per EDP.

## 2022-10-29 NOTE — ED Provider Notes (Signed)
[facility] Provider Note    Event Date/Time   First MD Initiated Contact with Patient 10/29/22 0719     (approximate)   History   Chest Pain   HPI  Benjamin Brewer is a 89 y.o. male with history of paroxysmal atrial fibrillation on Eliquis, CAD, history of tachybradycardia syndrome status post PPM who presents with palpitations.  Patient feels somewhat dizzy and his left arm feels tingly but he denies chest pain.  He reports compliance with his medications including his Toprol     Physical Exam   Triage Vital Signs: ED Triage Vitals  Enc Vitals Group     BP 10/29/22 0711 96/65     Pulse Rate 10/29/22 0711 (!) 146     Resp 10/29/22 0711 19     Temp 10/29/22 0711 97.6 F (36.4 C)     Temp src --      SpO2 10/29/22 0711 97 %     Weight --      Height --      Head Circumference --      Peak Flow --      Pain Score 10/29/22 0712 0     Pain Loc --      Pain Edu? --      Excl. in GC? --     Most recent vital signs: Vitals:   10/29/22 0930 10/29/22 1000  BP: 114/67 120/76  Pulse: (!) 59 61  Resp: 15 15  Temp:    SpO2: 98% 98%     General: Awake, no distress.  CV:  Good peripheral perfusion.  Irregular tachycardia Resp:  Normal effort.  Abd:  No distention.  Other:  No lower extremity swelling   ED Results / Procedures / Treatments   Labs (all labs ordered are listed, but only abnormal results are displayed) Labs Reviewed  BASIC METABOLIC PANEL - Abnormal; Notable for the following components:      Result Value   Glucose, Bld 106 (*)    GFR, Estimated 56 (*)    All other components within normal limits  TROPONIN I (HIGH SENSITIVITY) - Abnormal; Notable for the following components:   Troponin I (High Sensitivity) 21 (*)    All other components within normal limits  CBC     EKG  ED ECG REPORT I, Jene Every, the attending physician, personally viewed and interpreted this ECG.  Date: 10/29/2022  Rhythm: Atrial  fibrillation with RVR QRS Axis: normal Intervals: Abnormal ST/T Wave abnormalities: normal Narrative Interpretation: Occasional PVCs    RADIOLOGY X-ray viewed interpreted by me, no acute abnormality    PROCEDURES:  Critical Care performed: yes  CRITICAL CARE Performed by: Jene Every   Total critical care time:  Critical care time was exclusive of separately billable procedures and treating other patients.  Critical care was necessary to treat or prevent imminent or life-threatening deterioration.  Critical care was time spent personally by me on the following activities: development of treatment plan with patient and/or surrogate as well as nursing, discussions with consultants, evaluation of patient's response to treatment, examination of patient, obtaining history from patient or surrogate, ordering and performing treatments and interventions, ordering and review of laboratory studies, ordering and review of radiographic studies, pulse oximetry and re-evaluation of patient's condition.   Procedures   MEDICATIONS ORDERED IN ED: Medications  amiodarone (NEXTERONE PREMIX) 360-4.14 MG/200ML-% (1.8 mg/mL) IV infusion (60 mg/hr Intravenous New Bag/Given 10/29/22 0748)    Followed by  amiodarone (NEXTERONE  PREMIX) 360-4.14 MG/200ML-% (1.8 mg/mL) IV infusion (has no administration in time range)  amiodarone (NEXTERONE) 1.8 mg/mL load via infusion 150 mg (150 mg Intravenous Bolus from Bag 10/29/22 0744)  sodium chloride 0.9 % bolus 500 mL (500 mLs Intravenous New Bag/Given 10/29/22 0952)     IMPRESSION / MDM / ASSESSMENT AND PLAN / ED COURSE  I reviewed the triage vital signs and the nursing notes. Patient's presentation is most consistent with acute presentation with potential threat to life or bodily function.  Patient presents with palpitations found to be tachycardic upon arrival, blood pressure 96/65, typically hypertensive patient  EKG is most consistent with  atrial fibrillation with RVR, patient has a history of paroxysmal atrial fibrillation, he is on Eliquis.  Discussed with Dr. Mariah Milling of [facility] cardiology who recommends amiodarone 150 mg bolus over 20 to 30 minutes followed by infusion to see if he converts out of atrial fibrillation  ----------------------------------------- 8:35 AM on 10/29/2022 ----------------------------------------- Patient remains in atrial fibrillation although rate is better controlled, he reports he is feeling somewhat better, will continue to monitor  ----------------------------------------- 10:14 AM on 10/29/2022 ----------------------------------------- Patient is no longer in atrial fibrillation, normal heart rate, blood pressure has strongly improved, he feels well and is asymptomatic, discussed with Dr. Mariah Milling who recommends discharge on amiodarone 200 mg twice daily and they will arrange follow-up with cardiology      FINAL CLINICAL IMPRESSION(S) / ED DIAGNOSES   Final diagnoses:  Paroxysmal atrial fibrillation     Rx / DC Orders   ED Discharge Orders          Ordered    amiodarone (PACERONE) 200 MG tablet  2 times daily        10/29/22 1005             Note:  This document was prepared using Dragon voice recognition software and may include unintentional dictation errors.   Jene Every, MD 10/29/22 [phone]

## 2022-10-29 NOTE — ED Triage Notes (Addendum)
Pt comes  via EMs from home with c/o palpitations 3 mins ago when he woke up. Afib 130-160. 122/86 HR-140 98% RA 18 lac  Pt states some sob and numbness in left arm. Pt denies any current pain.. pt is on thinners. Pt states he does have hx but this time feels like skips in there.

## 2022-11-02 DIAGNOSIS — M799 Soft tissue disorder, unspecified: Secondary | ICD-10-CM | POA: Diagnosis not present

## 2022-11-02 DIAGNOSIS — M5412 Radiculopathy, cervical region: Secondary | ICD-10-CM | POA: Diagnosis not present

## 2022-11-02 DIAGNOSIS — M6281 Muscle weakness (generalized): Secondary | ICD-10-CM | POA: Diagnosis not present

## 2022-11-04 ENCOUNTER — Ambulatory Visit (INDEPENDENT_AMBULATORY_CARE_PROVIDER_SITE_OTHER): Payer: PPO

## 2022-11-04 ENCOUNTER — Telehealth: Payer: Self-pay

## 2022-11-04 DIAGNOSIS — I495 Sick sinus syndrome: Secondary | ICD-10-CM

## 2022-11-04 LAB — CUP PACEART REMOTE DEVICE CHECK
Battery Remaining Longevity: 153 mo
Battery Voltage: 3.03 V
Brady Statistic AP VP Percent: 0.04 %
Brady Statistic AP VS Percent: 69.48 %
Brady Statistic AS VP Percent: 0.02 %
Brady Statistic AS VS Percent: 30.46 %
Brady Statistic RA Percent Paced: 69.47 %
Brady Statistic RV Percent Paced: 0.06 %
Date Time Interrogation Session: 20240423212954
Implantable Lead Connection Status: 753985
Implantable Lead Connection Status: 753985
Implantable Lead Implant Date: 20221024
Implantable Lead Implant Date: 20221024
Implantable Lead Location: 753859
Implantable Lead Location: 753860
Implantable Lead Model: 5076
Implantable Lead Model: 5076
Implantable Pulse Generator Implant Date: 20221024
Lead Channel Impedance Value: 304 Ohm
Lead Channel Impedance Value: 342 Ohm
Lead Channel Impedance Value: 399 Ohm
Lead Channel Impedance Value: 494 Ohm
Lead Channel Pacing Threshold Amplitude: 0.625 V
Lead Channel Pacing Threshold Amplitude: 0.625 V
Lead Channel Pacing Threshold Pulse Width: 0.4 ms
Lead Channel Pacing Threshold Pulse Width: 0.4 ms
Lead Channel Sensing Intrinsic Amplitude: 0.375 mV
Lead Channel Sensing Intrinsic Amplitude: 0.375 mV
Lead Channel Sensing Intrinsic Amplitude: 6.75 mV
Lead Channel Sensing Intrinsic Amplitude: 6.75 mV
Lead Channel Setting Pacing Amplitude: 1.5 V
Lead Channel Setting Pacing Amplitude: 2 V
Lead Channel Setting Pacing Pulse Width: 0.4 ms
Lead Channel Setting Sensing Sensitivity: 0.9 mV
Zone Setting Status: 755011
Zone Setting Status: 755011

## 2022-11-04 NOTE — Telephone Encounter (Signed)
     Patient  visit on 4/18  at [facility]   Have you been able to follow up with your primary care physician? Yes   The patient was or was not able to obtain any needed medicine or equipment. Yes   Are there diet recommendations that you are having difficulty following? Na   Patient expresses understanding of discharge instructions and education provided has no other needs at this time.  Yes      [facility] Guide, [facility] [phone] [address] [place], [place], [place] [address] Phone: [phone] Email: [email or web address]

## 2022-11-05 DIAGNOSIS — M6281 Muscle weakness (generalized): Secondary | ICD-10-CM | POA: Diagnosis not present

## 2022-11-05 DIAGNOSIS — M5412 Radiculopathy, cervical region: Secondary | ICD-10-CM | POA: Diagnosis not present

## 2022-11-05 DIAGNOSIS — M799 Soft tissue disorder, unspecified: Secondary | ICD-10-CM | POA: Diagnosis not present

## 2022-11-09 DIAGNOSIS — L739 Follicular disorder, unspecified: Secondary | ICD-10-CM | POA: Diagnosis not present

## 2022-11-11 DIAGNOSIS — M6281 Muscle weakness (generalized): Secondary | ICD-10-CM | POA: Diagnosis not present

## 2022-11-11 DIAGNOSIS — M5412 Radiculopathy, cervical region: Secondary | ICD-10-CM | POA: Diagnosis not present

## 2022-11-11 DIAGNOSIS — M799 Soft tissue disorder, unspecified: Secondary | ICD-10-CM | POA: Diagnosis not present

## 2022-11-13 ENCOUNTER — Encounter: Payer: Self-pay | Admitting: Physician Assistant

## 2022-11-13 ENCOUNTER — Ambulatory Visit: Payer: PPO | Attending: Physician Assistant | Admitting: Physician Assistant

## 2022-11-13 NOTE — Progress Notes (Deleted)
Cardiology Office Note    Date:  11/13/2022   ID:  Benjamin Brewer, DOB 1931-02-21, MRN [id number]  PCP:  [name], MD  Cardiologist:  [name], MD  Electrophysiologist:  [name], MD   Chief Complaint: Follow-up  History of Present Illness:   Benjamin Brewer is a 89 y.o. male with history of CAD status post PCI/DES to the LAD and RCA in 2009, PAF on apixaban, tachybradycardia syndrome status post PPM, renal dysfunction, HTN, HLD, and cervical spine disease who presents for follow-up of CAD and A-fib.   He underwent PCI/DES to the LAD and RCA in 2009.  He was diagnosed with A-fib in 2015 and has been on anticoagulation since.  Echo in 01/2018 demonstrated an EF of 60 to 65%, calcified aortic valve without significant stenosis, mildly dilated left atrium, and no significant pulmonary hypertension.  He was diagnosed with COVID in 11/2020 with noted severe intermittent diarrhea since.  In that setting, he had worsening palpitations with episodes of dizziness with a sense of a pause in his heartbeat.  Subsequent Zio patch showed sinus rhythm with an average rate of 70 bpm.  There was 1 run of NSVT lasting 17 beats, 18 episodes of SVT with the longest lasting 15 seconds, and one 4.1-second pause.  He continued to have intermittent palpitations as well as presyncope and was evaluated by EP for tachybradycardia syndrome.  He subsequently underwent PPM implantation in 04/2021.  He was seen in the ED in 01/2022 with dizziness and presyncope.  BP 149/62 with a heart rate of 67 bpm.  Pacemaker interrogation was unremarkable.  CT head showed no acute abnormalities.  Labs showed a slight elevation in his serum creatinine at 1.49.  He was seen in the office for follow-up of the above ED visit and was without symptoms of chest pain, though did report worsening exertional dyspnea since his ED visit.  He continued to feel off balance and dizzy.  He also reported feeling that his heart stopped for  a few seconds.  He was not orthostatic.  Zio patch was recommended.  Echo on 02/04/2022 demonstrated an EF of 60 to 65%, no regional wall motion abnormalities, moderate LVH, grade 1 diastolic dysfunction, normal RV systolic function and ventricular cavity size and RVSP, mildly dilated left atrium, mildly to moderately dilated right atrium, trivial mitral regurgitation, aortic valve sclerosis without evidence of stenosis, and a mildly dilated aortic root measuring 42 mm.  PPM interrogation on 02/05/2022 showed 2 brief episodes of NSVT with the longest lasting 2 seconds.  He was seen by general cardiology in 03/2022 and was without symptoms of angina or decompensation.  Dizziness had improved.  Zio patch subsequently demonstrated a predominant rhythm of sinus with an average of 70 bpm, possible ventricular pacing present, 3 episodes of NSVT lasting up to 8 beats, 74 episodes of SVT with the longest interval lasting 30 seconds, less than 1% A-fib burden with the longest episode lasting 1 minute and 56 seconds, and occasional PACs and PVCs.  He was seen by EP in 04/2022 with remote interrogations showing stable function with several high ventricular rate episodes that appeared to be NSVT.  He continued to feel intermittent palpitations that would last for seconds at a time.  Metoprolol was titrated to 50 mg twice daily.  He was last seen in the office in 06/2022 and was without symptoms of angina or cardiac decompensation.  He had been taking Toprol-XL 25 mg twice daily rather than  50 mg twice daily for some time, though realized this upon picking up a new prescription.  When he ultimately titrated metoprolol to 50 mg twice daily he noted improvement in palpitation burden.  He was seen in the ED on 10/29/2022 with palpitations and was found to be in A-fib with RVR with rates in the 140s bpm.  Patient was given IV amiodarone bolus followed by infusion with subsequent conversion to sinus rhythm.  Patient was discharged on  amiodarone 200 mg twice daily with recommendation to follow-up as an outpatient.  ***   Labs independently reviewed: 10/2022 - Hgb 13.1, PLT 261, potassium 3.8, BUN 16, serum creatinine 1.24, albumin 4.3, AST/ALT normal, TSH normal 09/2021 - TC 150, TG 153, HDL 37, LDL 82 05/2021 - magnesium 2.1   Past Medical History:  Diagnosis Date   A-fib (HCC)    Arthritis    hands   Benign prostatic hypertrophy    Hx of    CAD (coronary artery disease)    native vessel. Status post LAD and RCA angioplasty and drug-eluting stent placement in 2009   Dental bridge present    implants, no bridge   Dizziness    Dyslipidemia    GERD (gastroesophageal reflux disease)    History of tachycardia    Hyperlipidemia    Hypertension    Intermediate coronary syndrome (HCC)    angina, unstable   Numbness and tingling of both lower extremities    Other dyspnea and respiratory abnormality    on exertion   Pinched nerve in neck    some discomfort with side to side movement   Vertigo    approx 1x/month    Past Surgical History:  Procedure Laterality Date   CARDIAC CATHETERIZATION     [facility]; x2 (last stent 2009)   CARDIAC CATHETERIZATION     [facility]   CARDIAC CATHETERIZATION     [facility]   CATARACT EXTRACTION W/PHACO Left 07/03/2019   Procedure: CATARACT EXTRACTION PHACO AND INTRAOCULAR LENS PLACEMENT (IOC) LEFT 10.19  01:06.9;  Surgeon: Nevada Crane, MD;  Location: [facility];  Service: Ophthalmology;  Laterality: Left;   ESOPHAGOGASTRODUODENOSCOPY (EGD) WITH PROPOFOL N/A 06/10/2015   Procedure: ESOPHAGOGASTRODUODENOSCOPY (EGD) WITH PROPOFOL;  Surgeon: Scot Jun, MD;  Location: [facility] ENDOSCOPY;  Service: Endoscopy;  Laterality: N/A;   HERNIA REPAIR     PACEMAKER IMPLANT N/A 05/05/2021   Procedure: PACEMAKER IMPLANT;  Surgeon: [name], MD;  Location: [facility] INVASIVE CV LAB;  Service: Cardiovascular;  Laterality: N/A;   SAVORY DILATION N/A 06/10/2015   Procedure:  SAVORY DILATION;  Surgeon: Scot Jun, MD;  Location: [facility] ENDOSCOPY;  Service: Endoscopy;  Laterality: N/A;   TONSILLECTOMY      Current Medications: No outpatient medications have been marked as taking for the 11/13/22 encounter (Appointment) with Sondra Barges, PA-C.    Allergies:   Prednisone and Terazosin   Social History   Socioeconomic History   Marital status: Widowed    Spouse name: Not on file   Number of children: 1   Years of education: Not on file   Highest education level: Bachelor's degree (e.g., BA, AB, BS)  Occupational History   Not on file  Tobacco Use   Smoking status: Never   Smokeless tobacco: Never  Vaping Use   Vaping Use: Never used  Substance and Sexual Activity   Alcohol use: Yes    Alcohol/week: 1.0 standard drink of alcohol    Types: 1 Glasses of wine  per week    Comment: occasional    Drug use: No   Sexual activity: Not on file  Other Topics Concern   Not on file  Social History Narrative   Widowed retired 12/95. Gets regular exercise.       Lives at home alone   Right handed   Social Determinants of Health   Financial Resource Strain: Not on file  Food Insecurity: Not on file  Transportation Needs: Not on file  Physical Activity: Not on file  Stress: Not on file  Social Connections: Not on file     Family History:  The patient's family history includes Heart attack in his mother; Hypertension in his mother; Stroke in his mother and another family member.  ROS:   12-point review of systems is negative unless otherwise noted in the HPI.   EKGs/Labs/Other Studies Reviewed:    Studies reviewed were summarized above. The additional studies were reviewed today:  Zio patch 01/2022: Patient had a min HR of 57 bpm, max HR of 226 bpm, and avg HR of 70 bpm. Predominant underlying rhythm was Sinus Rhythm. Possible Ventricular Pacing was present.  3 Ventricular Tachycardia runs occurred, the run with the fastest interval lasting 8  beats with a max rate of 226 bpm, the longest lasting 4 beats with an avg rate of 101 bpm.  74 Supraventricular Tachycardia runs occurred, the run with the fastest interval lasting 12 beats with a max rate of 190 bpm, the longest lasting 30.0 secs with an avg rate of 113 bpm.  Atrial Fibrillation occurred (<1% burden), ranging from 85-120 bpm (avg of 104 bpm), the longest lasting 1 min 56 secs with an avg rate of 104 bpm. Supraventricular Tachycardia was detected within +/- 45 seconds of symptomatic patient event(s).  Occasional PACs and rare PVCs. __________   Echo 02/04/2022: 1. Left ventricular ejection fraction, by estimation, is 60 to 65%. The  left ventricle has normal function. The left ventricle has no regional  wall motion abnormalities. There is moderate left ventricular hypertrophy.  Left ventricular diastolic  parameters are consistent with Grade I diastolic dysfunction (impaired  relaxation).   2. Right ventricular systolic function is normal. The right ventricular  size is normal. There is normal pulmonary artery systolic pressure.   3. Left atrial size was mildly dilated.   4. Right atrial size was mild to moderately dilated.   5. The mitral valve is grossly normal. Trivial mitral valve  regurgitation. No evidence of mitral stenosis.   6. The aortic valve is tricuspid. There is mild thickening of the aortic  valve. Aortic valve regurgitation is not visualized. Aortic valve  sclerosis is present, with no evidence of aortic valve stenosis.   7. Aortic dilatation noted. There is mild dilatation of the aortic root,  measuring 42 mm. __________   Limited echo 04/25/2021: 1. Left ventricular ejection fraction, by estimation, is 60 to 65%. The  left ventricle has normal function. The left ventricle has no regional  wall motion abnormalities. There is mild left ventricular hypertrophy.  Left ventricular diastolic parameters  are consistent with Grade I diastolic dysfunction  (impaired relaxation).  The average left ventricular global longitudinal strain is -17.0 %. The  global longitudinal strain is normal.   2. Right ventricular systolic function is normal. The right ventricular  size is normal. Tricuspid regurgitation signal is inadequate for assessing  PA pressure.   3. The mitral valve is normal in structure. Mild to moderate mitral valve  regurgitation. No  evidence of mitral stenosis.   4. The aortic valve was not well visualized. Aortic valve regurgitation  is not visualized. Mild to moderate aortic valve sclerosis/calcification  is present, without any evidence of aortic stenosis.   5. The inferior vena cava is normal in size with greater than 50%  respiratory variability, suggesting right atrial pressure of 3 mmHg. __________   2D echo 05/09/2020: 1. Left ventricular ejection fraction, by estimation, is 55 to 60%. The  left ventricle has normal function. The left ventricle has no regional  wall motion abnormalities. Left ventricular diastolic parameters are  consistent with Grade I diastolic  dysfunction (impaired relaxation). The average left ventricular global  longitudinal strain is -4.8 %.   2. Right ventricular systolic function is normal. The right ventricular  size is normal. There is normal pulmonary artery systolic pressure. The  estimated right ventricular systolic pressure is 31.6 mmHg.   3. Left atrial size was mildly dilated.   4. Mild to moderate mitral valve regurgitation.   5. The aortic valve is moderately calcified. Aortic valve regurgitation  is not visualized. Mild to moderate aortic valve sclerosis/calcification  is present, without any evidence of aortic stenosis.   6. There is borderline dilatation of the aortic root, measuring 38 mm. __________   Luci Bank 01/2021: Patient had a min HR of 40 bpm, max HR of 182 bpm, and avg HR of 70 bpm.  Predominant underlying rhythm was Sinus Rhythm. 1 run of Ventricular Tachycardia occurred  lasting 17 beats with a max rate of 154 bpm (avg 136 bpm).  88 Supraventricular Tachycardia runs occurred, the run with the fastest interval lasting 5 beats with a max rate of 182 bpm, the longest lasting 14.7 secs with an avg rate of 103 bpm. 1 Pause occurred lasting 4.1 secs (15 bpm). True duration of Pause difficult to ascertain due to artifact.  Occasional PACs and rare PVCs. __________   2D echo 01/2018: - Left ventricle: The cavity size was normal. There was mild   concentric hypertrophy. Systolic function was normal. The   estimated ejection fraction was in the range of 60% to 65%. Wall   motion was normal; there were no regional wall motion   abnormalities. Doppler parameters are consistent with abnormal   left ventricular relaxation (grade 1 diastolic dysfunction). - Aortic valve: Trileaflet; moderately thickened, moderately   calcified leaflets, predominantly non-coronary leaflet. Valve   mobility was restricted. Sclerosis without stenosis. There was no   regurgitation. - Aortic root: The aortic root was normal in size. - Mitral valve: There was trivial regurgitation. - Left atrium: The atrium was mildly dilated. - Right ventricle: The cavity size was normal. Wall thickness was   normal. Systolic function was normal. - Right atrium: The atrium was normal in size. - Tricuspid valve: There was trivial regurgitation. - Pulmonic valve: There was trivial regurgitation. - Pulmonary arteries: Systolic pressure was within the normal   range. - Inferior vena cava: The vessel was normal in size. - Pericardium, extracardiac: There was no pericardial effusion. __________   48-hour Holter 01/2018: Normal sinus rhythm with average heart rate of 65 bpm.  Minimal heart rate of 43 bpm with the longest pause of 1.64 seconds Rare PACs and PVCs. Short runs of SVT with the longest run of 14 beats with a heart rate of 142 bpm Some of the patient's reported events, correlated with PACs and  PVCs.   EKG:  EKG is ordered today.  The EKG ordered today demonstrates ***  Recent Labs: 10/29/2022: BUN 16; Creatinine, Ser 1.24; Hemoglobin 13.1; Platelets 261; Potassium 3.8; Sodium 139  Recent Lipid Panel    Component Value Date/Time   CHOL 115 10/08/2012 0554   TRIG 75 10/08/2012 0554   HDL 43 10/08/2012 0554   VLDL 15 10/08/2012 0554   LDLCALC 57 10/08/2012 0554    PHYSICAL EXAM:    VS:  There were no vitals taken for this visit.  BMI: There is no height or weight on file to calculate BMI.  Physical Exam  Wt Readings from Last 3 Encounters:  09/01/22 205 lb (93 kg)  08/12/22 204 lb (92.5 kg)  07/01/22 204 lb 8 oz (92.8 kg)     ASSESSMENT & PLAN:   CAD involving the native coronary arteries without***angina:  PAF:  Tachybradycardia syndrome: Status post PPM.  Followed by EP.  HTN: Blood pressure  HLD: LDL 69 in 02/2022.   {Are you ordering a CV Procedure (e.g. stress test, cath, DCCV, TEE, etc)?   Press F2        :[id number]}     Disposition: F/u with Dr. Kirke Corin or an APP in ***, and EP as directed.    Medication Adjustments/Labs and Tests Ordered: Current medicines are reviewed at length with the patient today.  Concerns regarding medicines are outlined above. Medication changes, Labs and Tests ordered today are summarized above and listed in the Patient Instructions accessible in Encounters.   Signed, Eula Listen, PA-C 11/13/2022 7:01 AM     [facility] [address] [place], [place] [address] [phone]

## 2022-11-20 ENCOUNTER — Other Ambulatory Visit: Payer: Self-pay | Admitting: Physician Assistant

## 2022-11-20 NOTE — Telephone Encounter (Signed)
last visit 06/16/2022--F/u with Dr. Kirke Corin or an APP in 6 months, and EP as directed.  next visit 12/18/22

## 2022-11-23 NOTE — Addendum Note (Signed)
Addended by: Letta Kocher A on: 11/23/2022 02:11 PM   Modules accepted: Orders

## 2022-11-24 ENCOUNTER — Other Ambulatory Visit: Payer: Self-pay | Admitting: Cardiovascular Disease

## 2022-11-24 NOTE — Telephone Encounter (Signed)
Left a message for the patient to call back.  

## 2022-11-24 NOTE — Telephone Encounter (Signed)
Please advise if OK to refill. Prescribed by ER physician last month for A Fib. Patient has F/U appointment scheduled with Dr. Kirke Corin on 12/18/22. Thank you!

## 2022-11-24 NOTE — Telephone Encounter (Signed)
Okay to refill but decrease the dose to 200 mg once daily.

## 2022-11-25 ENCOUNTER — Telehealth: Payer: Self-pay

## 2022-11-25 ENCOUNTER — Telehealth: Payer: Self-pay | Admitting: *Deleted

## 2022-11-25 NOTE — Telephone Encounter (Signed)
Spoke with patient who is agreeable to do a tele visit on 5/16 a t 3:20 pm. Med rec and consent have been done.

## 2022-11-25 NOTE — Telephone Encounter (Signed)
  Patient Consent for Virtual Visit        Benjamin Brewer has provided verbal consent on 11/25/2022 for a virtual visit (video or telephone).   CONSENT FOR VIRTUAL VISIT FOR:  Benjamin Brewer  By participating in this virtual visit I agree to the following:  I hereby voluntarily request, consent and authorize [facility] and its employed or contracted physicians, physician assistants, nurse practitioners or other licensed health care professionals (the Practitioner), to provide me with telemedicine health care services (the "Services") as deemed necessary by the treating Practitioner. I acknowledge and consent to receive the Services by the Practitioner via telemedicine. I understand that the telemedicine visit will involve communicating with the Practitioner through live audiovisual communication technology and the disclosure of certain medical information by electronic transmission. I acknowledge that I have been given the opportunity to request an in-person assessment or other available alternative prior to the telemedicine visit and am voluntarily participating in the telemedicine visit.  I understand that I have the right to withhold or withdraw my consent to the use of telemedicine in the course of my care at any time, without affecting my right to future care or treatment, and that the Practitioner or I may terminate the telemedicine visit at any time. I understand that I have the right to inspect all information obtained and/or recorded in the course of the telemedicine visit and may receive copies of available information for a reasonable fee.  I understand that some of the potential risks of receiving the Services via telemedicine include:  Delay or interruption in medical evaluation due to technological equipment failure or disruption; Information transmitted may not be sufficient (e.g. poor resolution of images) to allow for appropriate medical decision making by the  Practitioner; and/or  In rare instances, security protocols could fail, causing a breach of personal health information.  Furthermore, I acknowledge that it is my responsibility to provide information about my medical history, conditions and care that is complete and accurate to the best of my ability. I acknowledge that Practitioner's advice, recommendations, and/or decision may be based on factors not within their control, such as incomplete or inaccurate data provided by me or distortions of diagnostic images or specimens that may result from electronic transmissions. I understand that the practice of medicine is not an exact science and that Practitioner makes no warranties or guarantees regarding treatment outcomes. I acknowledge that a copy of this consent can be made available to me via my patient portal [facility] MyChart), or I can request a printed copy by calling the office of [facility].    I understand that my insurance will be billed for this visit.   I have read or had this consent read to me. I understand the contents of this consent, which adequately explains the benefits and risks of the Services being provided via telemedicine.  I have been provided ample opportunity to ask questions regarding this consent and the Services and have had my questions answered to my satisfaction. I give my informed consent for the services to be provided through the use of telemedicine in my medical care

## 2022-11-25 NOTE — Telephone Encounter (Signed)
Patient with diagnosis of afib on Eliquis for anticoagulation.    Procedure: DORSAL SLIT  Date of procedure: 11/30/22   CHA2DS2-VASc Score = 4   This indicates a 4.8% annual risk of stroke. The patient's score is based upon: CHF History: 0 HTN History: 1 Diabetes History: 0 Stroke History: 0 Vascular Disease History: 1 Age Score: 2 Gender Score: 0      CrCl 36ml/min  Per office protocol, patient can hold Eliquis for 2-3 days prior to procedure.    **This guidance is not considered finalized until pre-operative APP has relayed final recommendations.**

## 2022-11-25 NOTE — Telephone Encounter (Signed)
-----   Message from Verlee Monte, NP sent at 11/24/2022  8:05 PM EDT ----- Regarding: Request for pre-operative cardiac clearance Request for pre-operative cardiac clearance:  1. What type of surgery is being performed?  DORSAL SLIT  2. When is this surgery scheduled?  11/30/2022  3. Type of clearance being requested (medical, pharmacy, both)? BOTH   4. Are there any medications that need to be held prior to surgery? APIXABAN  5. Practice name and name of physician performing surgery?  Performing surgeon: Dr. Vanna Scotland, MD Requesting clearance: Quentin Mulling, FNP-C    6. Anesthesia type (none, local, MAC, general)? GENERAL  7. What is the office phone and fax number?   Phone: [phone] Fax: [phone]  ATTENTION: Unable to create telephone message as per your standard workflow. Directed by [facility] providers to send requests for cardiac clearance to this pool for appropriate distribution to provider covering pre-operative clearances.   Quentin Mulling, MSN, APRN, FNP-C, CEN [facility]  Peri-operative Services Nurse Practitioner Phone: [phone] 11/24/22 8:05 PM

## 2022-11-25 NOTE — Telephone Encounter (Signed)
Primary Cardiologist:Muhammad Kirke Corin, MD   Preoperative team, please contact this patient and set up a phone call appointment (due to > 2 months since previous clearance and ED admission for a fib) for further preoperative risk assessment. Please obtain consent and complete medication review. Thank you for your help.   I confirm that guidance regarding antiplatelet and oral anticoagulation therapy has been completed and, if necessary, noted below.  Levi Aland, NP-C  11/25/2022, 11:39 AM [address] Office [phone] Fax [phone]

## 2022-11-25 NOTE — Telephone Encounter (Signed)
1. What type of surgery is being performed?  DORSAL SLIT   2. When is this surgery scheduled?  11/30/2022    3. Type of clearance being requested (medical, pharmacy, both)?  BOTH    4. Are there any medications that need to be held prior to surgery?  APIXABAN   5. Practice name and name of physician performing surgery?  Performing surgeon: Dr. Vanna Scotland, MD  Requesting clearance: Quentin Mulling, FNP-C     6. Anesthesia type (none, local, MAC, general)?  GENERAL   7. What is the office phone and fax number?   Phone: [phone]  Fax: [phone]

## 2022-11-26 ENCOUNTER — Encounter: Payer: Self-pay | Admitting: Nurse Practitioner

## 2022-11-26 ENCOUNTER — Other Ambulatory Visit: Payer: Self-pay

## 2022-11-26 ENCOUNTER — Ambulatory Visit: Payer: PPO | Attending: Interventional Cardiology | Admitting: Nurse Practitioner

## 2022-11-26 ENCOUNTER — Encounter
Admission: RE | Admit: 2022-11-26 | Discharge: 2022-11-26 | Disposition: A | Discharge status: Home / Self Care | Payer: PPO | Source: Ambulatory Visit | Attending: Urology | Admitting: Urology

## 2022-11-26 ENCOUNTER — Encounter: Payer: Self-pay | Admitting: Cardiology

## 2022-11-26 ENCOUNTER — Encounter: Payer: Self-pay | Admitting: Urology

## 2022-11-26 DIAGNOSIS — Z01810 Encounter for preprocedural cardiovascular examination: Secondary | ICD-10-CM

## 2022-11-26 HISTORY — DX: Anemia, unspecified: D64.9

## 2022-11-26 HISTORY — DX: Chronic kidney disease, unspecified: N18.9

## 2022-11-26 HISTORY — DX: Pneumonia, unspecified organism: J18.9

## 2022-11-26 NOTE — Progress Notes (Signed)
PERIOPERATIVE PRESCRIPTION FOR IMPLANTED CARDIAC DEVICE PROGRAMMING  Patient Information: Name:  Benjamin Brewer  DOB:  1930/12/15  MRN:  [id number]    Planned Procedure: DORSAL SLIT (PENILE)   Surgeon:  Dr. Vanna Scotland, MD  Requesting device clearance: Quentin Mulling, FNP-C  Date of Procedure:  11/30/2022  Cautery will be used.   Please route documentation back me via [facility], or may fax report to [facility] PAT APP at [phone].  Device Information:  Clinic EP Physician:  Dr. Steffanie Dunn   Device Type:  Pacemaker Manufacturer and Phone #:  Medtronic: [id number] Pacemaker Dependent?:  No Date of Last Device Check:  11/02/2021 Normal Device Function?:  Yes.    Electrophysiologist's Recommendations:  Have magnet available. Provide continuous ECG monitoring when magnet is used or reprogramming is to be performed.  Procedure should not interfere with device function.  No device programming or magnet placement needed.  Per Device Clinic Standing Orders, Lenor Coffin, RN  10:40 AM 11/26/2022

## 2022-11-26 NOTE — Progress Notes (Signed)
Virtual Visit via Telephone Note   Because of Benjamin Brewer's co-morbid illnesses, he is at least at moderate risk for complications without adequate follow up.  This format is felt to be most appropriate for this patient at this time.  The patient did not have access to video technology/had technical difficulties with video requiring transitioning to audio format only (telephone).  All issues noted in this document were discussed and addressed.  No physical exam could be performed with this format.  Please refer to the patient's chart for his consent to telehealth for [facility].  Evaluation Performed:  Preoperative cardiovascular risk assessment _____________   Date:  11/26/2022   Patient ID:  Benjamin Brewer, DOB [birth year], MRN [id number] Patient Location:  Home Provider location:   Office  Primary Care Provider:  [name], MD Primary Cardiologist:  [name], MD  Chief Complaint / Patient Profile   89 y.o. y/o male with a h/o CAD s/p PCI/DES to LAD and RCA in 2009, PAF on chronic anticoagulation, tachybradycardia syndrome s/p PPM implant, renal dysfunction, HTN, HLD who is pending dorsal slit and presents today for telephonic preoperative cardiovascular risk assessment.  History of Present Illness    Benjamin Brewer is a 89 y.o. male who presents via audio/video conferencing for a telehealth visit today.  Pt was last seen in cardiology clinic on 06/16/22 by Eula Listen, PA.  At that time KALIM PICONE was doing well.  The patient is now pending procedure as outlined above. Since his last visit, he went to ED 10/29/22 for a fib and has had no further concerning symptoms. He  denies chest pain, shortness of breath, lower extremity edema, fatigue, palpitations, melena, hematuria, hemoptysis, diaphoresis, weakness, presyncope, syncope, orthopnea, and PND. He works out in a home gym 30-45 minutes several days per week with no concerning cardiac symptoms.   Past  Medical History    Past Medical History:  Diagnosis Date   A-fib [facility])    Arthritis    hands   Benign prostatic hypertrophy    Hx of    CAD (coronary artery disease)    native vessel. Status post LAD and RCA angioplasty and drug-eluting stent placement in 2009   Dental bridge present    implants, no bridge   Dizziness    Dyslipidemia    GERD (gastroesophageal reflux disease)    History of tachycardia    Hyperlipidemia    Hypertension    Intermediate coronary syndrome (HCC)    angina, unstable   Numbness and tingling of both lower extremities    Other dyspnea and respiratory abnormality    on exertion   Pinched nerve in neck    some discomfort with side to side movement   Vertigo    approx 1x/month   Past Surgical History:  Procedure Laterality Date   CARDIAC CATHETERIZATION     [place]; x2 (last stent 2009)   CARDIAC CATHETERIZATION     [facility]   CARDIAC CATHETERIZATION     [facility]   CATARACT EXTRACTION W/PHACO Left 07/03/2019   Procedure: CATARACT EXTRACTION PHACO AND INTRAOCULAR LENS PLACEMENT (IOC) LEFT 10.19  01:06.9;  Surgeon: Nevada Crane, MD;  Location: [facility];  Service: Ophthalmology;  Laterality: Left;   ESOPHAGOGASTRODUODENOSCOPY (EGD) WITH PROPOFOL N/A 06/10/2015   Procedure: ESOPHAGOGASTRODUODENOSCOPY (EGD) WITH PROPOFOL;  Surgeon: Scot Jun, MD;  Location: [facility];  Service: Endoscopy;  Laterality: N/A;   HERNIA REPAIR     PACEMAKER  IMPLANT N/A 05/05/2021   Procedure: PACEMAKER IMPLANT;  Surgeon: [name], MD;  Location: [facility] INVASIVE CV LAB;  Service: Cardiovascular;  Laterality: N/A;   SAVORY DILATION N/A 06/10/2015   Procedure: SAVORY DILATION;  Surgeon: Scot Jun, MD;  Location: [facility] END[facility];  Service: Endoscopy;  Laterality: N/A;   TONSILLECTOMY      Allergies  Allergies  Allergen Reactions   Prednisone     "they think threw me into A-fib"   Terazosin Other (See Comments)    Muscle Pain     Home Medications    Prior to Admission medications   Medication Sig Start Date End Date Taking? Authorizing Provider  amiodarone (PACERONE) 200 MG tablet Take 1 tablet (200 mg total) by mouth 2 (two) times daily. 10/29/22 11/28/22  Jene Every, MD  atorvastatin (LIPITOR) 40 MG tablet TAKE 1 TABLET BY MOUTH EVERY MORNING 11/20/22   Iran Ouch, MD  Carboxymethylcellul-Glycerin (LUBRICATING EYE DROPS OP) Place 1 drop into both eyes daily as needed (dry eyes).    [provider]  Cholecalciferol (VITAMIN D3 PO) Take 1 capsule by mouth daily.    [provider]  cyanocobalamin 500 MCG tablet Take 500 mcg by mouth 2 (two) times daily.    [provider]  diltiazem (CARDIZEM) 30 MG tablet TAKE ONE TABLET EVERY FOUR HOURS AS NEEDED FOR AFIB HEART RATE >100 03/17/22   Dunn, Raymon Mutton, PA-C  ELIQUIS 5 MG TABS tablet TAKE 1 TABLET BY MOUTH TWICE DAILY 01/31/18   Sondra Barges, PA-C  ferrous sulfate 325 (65 FE) MG tablet Take 325 mg by mouth daily with breakfast.    [provider]  Fiber-Vitamins-Minerals ([employer] FIBER GUMMIES) CHEW Chew by mouth.    [provider]  finasteride (PROSCAR) 5 MG tablet Take 5 mg by mouth daily. 09/28/18   [provider]  ipratropium (ATROVENT) 0.03 % nasal spray 1 spray as needed. 03/20/22   [provider]  losartan (COZAAR) 25 MG tablet TAKE 1 TABLET BY MOUTH DAILY 03/23/22   Iran Ouch, MD  metoprolol succinate (TOPROL-XL) 25 MG 24 hr tablet Take 25 mg by mouth 2 (two) times daily at 10 AM and 5 PM.    [provider]  Multiple Vitamin (MULTIVITAMIN) tablet Take 1 tablet by mouth daily.    [provider]  Multiple Vitamins-Minerals (PRESERVISION AREDS 2) CAPS Take 1 capsule by mouth 2 (two) times daily.    [provider]  nitroGLYCERIN (NITROSTAT) 0.4 MG SL tablet Place 1 nitroglycerin under your tongue, while sitting. If no relief of pain, may repeat 1 tablet every 5 minutes  up to 3 tablets over 15 minutes. 09/02/22   Iran Ouch, MD  pantoprazole (PROTONIX) 40 MG tablet TAKE 1 TABLET BY MOUTH NIGHTLY 04/02/22   Iran Ouch, MD  traMADol (ULTRAM) 50 MG tablet Take 50 mg by mouth every 6 (six) hours as needed. Patient not taking: Reported on 11/25/2022 08/25/22   [provider]    Physical Exam    Vital Signs:  MARIOS YELLOWHAIR does not have vital signs available for review today.  Given telephonic nature of communication, physical exam is limited. AAOx3. NAD. Normal affect.  Speech and respirations are unlabored.  Accessory Clinical Findings    None  Assessment & Plan    1.  Preoperative Cardiovascular Risk Assessment: According to the Revised Cardiac Risk Index (RCRI), his Perioperative Risk of Major Cardiac Event is (%): 0.9. His Functional Capacity  in METs is: 7.59 according to the Duke Activity Status Index (DASI). The patient is doing well from a cardiac perspective. Therefore, based on ACC/AHA guidelines, the patient would be at acceptable risk for the planned procedure without further cardiovascular testing.   The patient was advised that if he develops new symptoms prior to surgery to contact our office to arrange for a follow-up visit, and he verbalized understanding.  Per office protocol, patient can hold Eliquis for 2-3 days prior to procedure.   A copy of this note will be routed to requesting surgeon.  Time:   Today, I have spent 10 minutes with the patient with telehealth technology discussing medical history, symptoms, and management plan.    Levi Aland, NP-C  11/26/2022, 3:17 PM [address], [address] Office [phone] Fax [phone]

## 2022-11-26 NOTE — Patient Instructions (Addendum)
Report to the Registration Desk on the 1st floor of the Medical Mall. 11/30/22 - Monday To find out your arrival time, please call [phone] between 1PM - 3PM on: 11/27/22 - Friday  If your arrival time is 6:00 am, do not arrive before that time as the [facility] entrance doors do not open until 6:00 am.  REMEMBER: Instructions that are not followed completely may result in serious medical risk, up to and including death; or upon the discretion of your surgeon and anesthesiologist your surgery may need to be rescheduled.  Do not eat food or drink any liquids after midnight the night before surgery.  No gum chewing or hard candies.   One week prior to surgery: Stop Anti-inflammatories (NSAIDS) such as Advil, Aleve, Ibuprofen, Motrin, Naproxen, Naprosyn and Aspirin based products such as Excedrin, Goody's Powder, BC Powder.  Stop ANY OVER THE COUNTER supplements until after surgery.  You may however, continue to take Tylenol if needed for pain up until the day of surgery.  Continue taking all prescribed medications with the exception of the following:  - ELIQUIS - hold 3 days prior to your surgery beginning 11/27/22, resume taking with doctors instructions.   TAKE ONLY THESE MEDICATIONS THE MORNING OF SURGERY WITH A SIP OF WATER:  amiodarone (PACERONE)  atorvastatin (LIPITOR)  diltiazem (CARDIZEM) AS NEEDED FOR AFIB HEART RATE >100  finasteride (PROSCAR) metoprolol succinate (TOPROL-XL)  pantoprazole (PROTONIX)  (take one the night before and one on the morning of surgery - helps to prevent nausea after surgery.)   No Alcohol for 24 hours before or after surgery.  No Smoking including e-cigarettes for 24 hours before surgery.  No chewable tobacco products for at least 6 hours before surgery.  No nicotine patches on the day of surgery.  Do not use any "recreational" drugs for at least a week (preferably 2 weeks) before your surgery.  Please be advised that the  combination of cocaine and anesthesia may have negative outcomes, up to and including death. If you test positive for cocaine, your surgery will be cancelled.  On the morning of surgery brush your teeth with toothpaste and water, you may rinse your mouth with mouthwash if you wish. Do not swallow any toothpaste or mouthwash.  Do not wear jewelry, make-up, hairpins, clips or nail polish.  Do not wear lotions, powders, or perfumes.   Do not shave body hair from the neck down 48 hours before surgery.  Contact lenses, hearing aids and dentures may not be worn into surgery.  Do not bring valuables to the hospital. [facility] is not responsible for any missing/lost belongings or valuables.   Notify your doctor if there is any change in your medical condition (cold, fever, infection).  Wear comfortable clothing (specific to your surgery type) to the hospital.  After surgery, you can help prevent lung complications by doing breathing exercises.  Take deep breaths and cough every 1-2 hours. Your doctor may order a device called an Incentive Spirometer to help you take deep breaths. When coughing or sneezing, hold a pillow firmly against your incision with both hands. This is called "splinting." Doing this helps protect your incision. It also decreases belly discomfort.  If you are being admitted to the hospital overnight, leave your suitcase in the car. After surgery it may be brought to your room.  In case of increased patient census, it may be necessary for you, the patient, to continue your postoperative care in the Same Day Surgery department.  If you are being discharged the day of surgery, you will not be allowed to drive home. You will need a responsible individual to drive you home and stay with you for 24 hours after surgery.   If you are taking public transportation, you will need to have a responsible individual with you.  Please call the Pre-admissions Testing Dept. at [phone] if you have any questions about these instructions.  Surgery Visitation Policy:  Patients having surgery or a procedure may have two visitors.  Children under the age of 11 must have an adult with them who is not the patient.  Inpatient Visitation:    Visiting hours are 7 a.m. to 8 p.m. Up to four visitors are allowed at one time in a patient room. The visitors may rotate out with other people during the day.  One visitor age 18 or older may stay with the patient overnight and must be in the room by 8 p.m.

## 2022-11-27 ENCOUNTER — Encounter: Payer: Self-pay | Admitting: Urology

## 2022-11-27 NOTE — Telephone Encounter (Signed)
Patient has been made aware and new script sent in.

## 2022-11-27 NOTE — Progress Notes (Addendum)
Perioperative / Anesthesia Services  Pre-Admission Testing Clinical Review / Preoperative Anesthesia Consult  Date: 11/27/22  Patient Demographics:  Name: Benjamin Brewer DOB:   April 18, 1931 MRN:   [id number]  Planned Surgical Procedure(s):    Case: [id number] Date/Time: 11/30/22 1449   Procedure: DORSAL SLIT   Anesthesia type: General   Pre-op diagnosis: Phimosis   Location: [facility] OR ROOM 09 / [facility]   Surgeons: Vanna Scotland, MD     NOTE: Available PAT nursing documentation and vital signs have been reviewed. Clinical nursing staff has updated patient's PMH/PSHx, current medication list, and drug allergies/intolerances to ensure comprehensive history available to assist in medical decision making as it pertains to the aforementioned surgical procedure and anticipated anesthetic course. Extensive review of available clinical information personally performed. [place] PMH and PSHx updated with any diagnoses/procedures that  may have been inadvertently omitted during his intake with the pre-admission testing department's nursing staff.  Clinical Discussion:  Benjamin Brewer is a 89 y.o. male who is submitted for pre-surgical anesthesia review and clearance prior to him undergoing the above procedure. Patient has never been a smoker. Pertinent PMH includes: CAD, atrial fibrillation, NSVT, PSVT, diastolic dysfunction, tachybradycardia syndrome (s/p PPM placement), angina, aortic root dilatation, HTN, HLD, CKD-III, DOE, GERD (on daily PPI), anemia, BPH, phimosis, lower extremity paresthesias, vertigo, OA.  Patient is followed by cardiology Kirke Corin, MD). He was last seen in the cardiology clinic on 06/16/2022; notes reviewed. At the time of his clinic visit, patient doing well overall from a cardiovascular perspective.  Patient experiencing intermittent episodes of tachypalpitations.  Of note, patient has previously been advised to increase his beta-blocker dose to 50 mg  twice daily, however he had only been taking metoprolol succinate 25 mg once daily.  Patient had recently increased to the recommended 50 mg twice daily and noticed a improvement in his perceived palpitation burden. Patient denied any chest pain, shortness of breath, PND, orthopnea, significant peripheral edema, weakness, fatigue, vertiginous symptoms, or presyncope/syncope. Patient with a past medical history significant for cardiovascular diagnoses. Documented physical exam was grossly benign, providing no evidence of acute exacerbation and/or decompensation of the patient's known cardiovascular conditions.  Patient underwent diagnostic LEFT heart catheterization on 12/05/2002 revealing multivessel CAD; 90% proximal diagonal, 30% proximal LCx, 30% mid LCx, 20% proximal OM1, 40% proximal RCA, and 30% mid RCA.  Interventional radiology made the decision to defer intervention opting for medical management.  Patient experiencing unstable angina.  He underwent evaluation via repeat LEFT heart catheterization on 05/15/2008.  Study revealed multivessel CAD; 99% proximal RCA and 80% mid LAD.  PCI  was subsequently performed placing a 2.75 x 15 mm Promus DES x 1 to the proximal RCA and a 3.5 x 18 mm Promus DES x 1 to the mid LAD.  Procedure yielded excellent angiographic result and TIMI-3 flow.  Long-term cardiac event monitor study performed on 01/12/2018 revealed runs of PSVT with the longest lasting 14 beats at a maximum rate of 142 bpm.  Repeat study performed on 02/12/2021 revealed 88 runs of PSVT with the longest lasting 5 beats at a maximum rate of 182 bpm.  Additionally, study revealed a 17 beat run of NSVT occurring at a maximum rate of 154 bpm.  Patient with a history of tachybradycardia syndrome, which ultimately required resynchronization therapy.  Patient underwent placement of a Medtronic Azure XT DR MRI SureScan dual-chamber pacemaker placed on 05/05/2021.  Device is regularly interrogated by  patient's primary electrophysiology team.  Last interrogation was on 11/03/2022, at which time device was functioning normally.  Most recent TTE was performed on 04/25/2021 revealing a normal left ventricular systolic function with an EF of 60-65%.  There were no regional wall motion abnormalities.  There was mild LVH. Left ventricular diastolic Doppler parameters consistent with abnormal relaxation (G1DD). Right ventricular size and function normal.  There was mild to moderate mitral valve regurgitation.  Mild to moderate sclerosis/calcification of the aortic valve was observed. All transvalvular gradients were noted to be normal providing no evidence suggestive of valvular stenosis.  Patient with an atrial fibrillation diagnosis; CHA2DS2-VASc Score = 4 (age x 2, HTN, vascular disease history). His rate and rhythm are currently being maintained on oral amiodarone + diltiazem + metoprolol succinate. He is chronically anticoagulated using apixaban; reported to be compliant with therapy with no evidence or reports of GI bleeding.  Blood pressure well controlled at 110/72 mmHg on currently prescribed ARB (losartan) and beta-blocker (metoprolol succinate) therapies. He is on a atorvastatin for his HLD diagnosis and further ASCVD prevention.  Patient has a supply of short acting nitrates (NTG) to use on a as needed basis for recurrent angina/anginal equivalent symptoms.  Patient is not diabetic.  He does not have an OSAH diagnosis. Functional capacity, as defined by DASI, is documented as being >/= 4 METS.  No changes were made to his medication history.  Patient to continue regularly scheduled visits with electrophysiology for implanted cardiac device management.  He was scheduled to follow-up with outpatient cardiology in 6 months or sooner if needed.  Benjamin Brewer is scheduled for an DORSAL SLIT (PENIS) on 11/30/2022 with Dr. Vanna Scotland, MD.  Given patient's past medical history significant for  cardiovascular diagnoses, presurgical cardiac clearance was sought by the PAT team.  Per cardiology, "according to the Revised Cardiac Risk Index (RCRI), his Perioperative Risk of Major Cardiac Event is (%): 0.9. His Functional Capacity in METs is: 7.59 according to the [place] Activity Status Index (DASI). The patient is doing well from a cardiac perspective. Therefore, based on ACC/AHA guidelines, the patient would be at ACCEPTABLE risk for the planned procedure without further cardiovascular testing".  Again, this patient is on daily oral anticoagulation therapy He has been instructed on recommendations for holding his apixaban for 3 days prior to his procedure with plans to restart as soon as postoperative bleeding risk felt to be minimized by his attending surgeon. The patient has been instructed that his last dose of his apixaban should be on 11/26/2022.  Patient denies previous perioperative complications with anesthesia in the past. In review of the available records, it is noted that patient underwent a MAC anesthetic course at [facility] in 06/2019 without documented complications.      10/29/2022   10:00 AM 10/29/2022    9:30 AM 10/29/2022    9:00 AM  Vitals with BMI  Systolic 120 114 161  Diastolic 76 67 79  Pulse 61 59 61    Providers/Specialists:   NOTE: Primary physician provider listed below. Patient may have been seen by APP or partner within same practice.   PROVIDER ROLE / SPECIALTY LAST Jaquelyn Bitter, MD Urology (Surgeon) 09/01/2022  [name], MD Primary Care Provider 10/20/2022  [name], MD Cardiology 06/16/2022  Merri Ray, MD Physiatry 10/16/2022   Allergies:  Prednisone and Terazosin  Current Home Medications:   No current facility-administered medications for this encounter.    amiodarone (PACERONE) 200 MG tablet  atorvastatin (LIPITOR) 40 MG tablet   Carboxymethylcellul-Glycerin (LUBRICATING EYE DROPS OP)    Cholecalciferol (VITAMIN D3 PO)   cyanocobalamin 500 MCG tablet   diltiazem (CARDIZEM) 30 MG tablet   ELIQUIS 5 MG TABS tablet   ferrous sulfate 325 (65 FE) MG tablet   Fiber-Vitamins-Minerals ([employer] FIBER GUMMIES) CHEW   finasteride (PROSCAR) 5 MG tablet   ipratropium (ATROVENT) 0.03 % nasal spray   losartan (COZAAR) 25 MG tablet   metoprolol succinate (TOPROL-XL) 25 MG 24 hr tablet   Multiple Vitamin (MULTIVITAMIN) tablet   Multiple Vitamins-Minerals (PRESERVISION AREDS 2) CAPS   nitroGLYCERIN (NITROSTAT) 0.4 MG SL tablet   pantoprazole (PROTONIX) 40 MG tablet   History:   Past Medical History:  Diagnosis Date   A-fib (HCC)    a.) CHA2DS2VASc = 4 (age x 2, HTN, vascular disease history); b.) rate/rhythm maintained on oral amiodarone + diltiazem + metoprolol succinate; chronically anticoagulated with apixaban   Anemia    Angina pectoris (HCC)    Aortic atherosclerosis (HCC)    Aortic root dilatation (HCC) 05/09/2020   a.) TTE 05/09/2020: 38 mm; b.) TTE 02/04/2022: 42 mm   Arthritis of both hands    Benign prostatic hypertrophy    CAD (coronary artery disease)    a.) LHC 12/05/2002: 90% prox diagonal, 30% pLCx, 30% mLCx, 20% pOM1, 40% pRCA, 30% mRCA - med mgmt; b.) LHC/PCI 05/15/2008: 99% pRCA (2.75 x 15 mm Promus DES), 80% mLAD (3.5 x 18 mm Promus DES)   CKD (chronic kidney disease), stage III (HCC)    Dental root implant present    Diastolic dysfunction    a.) TTE 09/04/2014: EF 55-60%, RVE, LAE, mild MR, G1DD; b.) TTE 01/15/2018: EF 60-65%, LAE, AoV sclerosis, triv MR/TR/PR, G1DD; c.) TTE 05/09/2020: EF 55-60%, LAD, mild-mod AoV sclerosis, G1DD; d.) TTE 04/25/2021: EF 60-65%, LVH, mild-mod MR, G1DD; e.) TTE 02/04/2022: EF 60-65%, mod LVH, LAE, mod RAE, AoV sclerosis, triv MR, G1DD   DOE (dyspnea on exertion)    Dyslipidemia    GERD (gastroesophageal reflux disease)    History of 2019 novel coronavirus disease (COVID-19) 11/2020   Hyperlipidemia    Hypertension    Long term  current use of amiodarone    NSVT (nonsustained ventricular tachycardia) (HCC) 02/12/2021   a.) holter 02/12/2021: 17 beat run at a maximum rate of 154 bpm   Numbness and tingling of both lower extremities    Phimosis of penis    Pneumonia    Presence of permanent cardiac pacemaker 05/05/2021   a.) s/p dual-chamber PPM 05/05/2021; MDT Azure XT DR MRI SureScan (SN: [id number])   PSVT (paroxysmal supraventricular tachycardia) 01/12/2018   a.) holter 01/12/2018: PSVT runs --> longest lasting 14 beats at max rate of 142 bpm; b.) holter 02/12/2021: PSVT x 88 runs  --> longest lasting 5 beats at a maximum rat of 182 bpm   Tachy-brady syndrome (HCC)    a.) s/p PPM placement 05/05/2021   Vertigo    Past Surgical History:  Procedure Laterality Date   CARDIAC CATHETERIZATION     [facility]   CATARACT EXTRACTION [facility] Left 07/03/2019   Procedure: CATARACT EXTRACTION PHACO AND INTRAOCULAR LENS PLACEMENT (IOC) LEFT 10.19  01:06.9;  Surgeon: Nevada Crane, MD;  Location: [facility];  Service: Ophthalmology;  Laterality: Left;   COLONOSCOPY     CORONARY ANGIOPLASTY WITH STENT PLACEMENT Left 05/15/2008   Procedure: CORONARY ANGIOPLASTY WITH STENT PLACEMENT; Location: [facility]; Surgeon: Melene Muller, MD   ESOPHAGOGASTRODUODENOSCOPY (EGD) WITH  PROPOFOL N/A 06/10/2015   Procedure: ESOPHAGOGASTRODUODENOSCOPY (EGD) WITH PROPOFOL;  Surgeon: Scot Jun, MD;  Location: [facility] ENDOSCOPY;  Service: Endoscopy;  Laterality: N/A;   HERNIA REPAIR     LEFT HEART CATH AND CORONARY ANGIOGRAPHY Left 12/05/2002   Procedure: CORONARY ANGIOPLASTY WITH STENT PLACEMENT; Location: [facility]; Surgeon: Randa Evens, MD   PACEMAKER IMPLANT N/A 05/05/2021   Procedure: PACEMAKER IMPLANT;  Surgeon: [name], MD;  Location: [facility] INVASIVE CV LAB;  Service: Cardiovascular;  Laterality: N/A;   SAVORY DILATION N/A 06/10/2015   Procedure: SAVORY DILATION;  Surgeon: Scot Jun, MD;   Location[facility];  Service: Endoscopy;  Laterality: N/A;   TONSILLECTOMY     Family History  Problem Relation Age of Onset   Stroke Mother    Heart attack Mother    Hypertension Mother    Stroke Other        family history   Social History   Tobacco Use   Smoking status: Never   Smokeless tobacco: Never  Vaping Use   Vaping Use: Never used  Substance Use Topics   Alcohol use: Yes    Alcohol/week: 1.0 standard drink of alcohol    Types: 1 Glasses of wine per week    Comment: occasional    Drug use: No    Pertinent Clinical Results:  LABS:   Lab Results  Component Value Date   WBC 7.5 10/29/2022   HGB 13.1 10/29/2022   HCT 39.2 10/29/2022   MCV 91.4 10/29/2022   PLT 261 10/29/2022   Lab Results  Component Value Date   NA 139 10/29/2022   K 3.8 10/29/2022   CO2 24 10/29/2022   GLUCOSE 106 (H) 10/29/2022   BUN 16 10/29/2022   CREATININE 1.24 10/29/2022   CALCIUM 9.1 10/29/2022   EGFR 58 (L) 04/16/2021   GFRNONAA 56 (L) 10/29/2022    ECG: Date: 10/29/2022 Time ECG obtained: 0716 AM Rate: 141 bpm Rhythm: atrial fibrillation with RVR and PVCs Axis (leads I and aVF): Normal Intervals: QRS 86 ms. QTc 505 ms. ST segment and T wave changes: No evidence of acute ST segment elevation or depression.  Evidence of a possible age undetermined anterior infarct present Comparison: Previous tracing obtained on 06/16/2022 showed atrial paced rhythm with prolonged AV conduction (rate 66 bpm).  Evidence of age undetermined anterior infarct was present.   IMAGING / PROCEDURES: DG CHEST PORT 1 VIEW performed on 10/29/2022 Left-sided dual lead cardiac device in place with unchanged lead positioning.  No pleural effusion or pneumothorax.  Unchanged cardiac and mediastinal contours.  No focal airspace opacity.  There are prominent bilateral interstitial opacities which can be seen in the setting of small airways disease or pulmonary venous congestion.  No  radiographically apparent displaced rib fractures.  TRANSTHORACIC ECHOCARDIOGRAM performed on 04/25/2021 Left ventricular ejection fraction, by estimation, is 60 to 65%. The left ventricle has normal function. The left ventricle has no regional wall motion abnormalities. There is mild left ventricular hypertrophy. Left ventricular diastolic parameters are consistent with Grade I diastolic dysfunction (impaired relaxation). The average left ventricular global longitudinal strain is -17.0 %. The global longitudinal strain is normal.  Right ventricular systolic function is normal. The right ventricular size is normal. Tricuspid regurgitation signal is inadequate for assessing PA pressure.  The mitral valve is normal in structure. Mild to moderate mitral valve regurgitation. No evidence of mitral stenosis.  The aortic valve was not well visualized. Aortic valve regurgitation is not visualized. Mild to  moderate aortic valve sclerosis/calcification is present, without any evidence of aortic stenosis.  The inferior vena cava is normal in size with greater than 50% respiratory variability, suggesting right atrial pressure of 3 mmHg.   LONG TERM CARDIAC EVENT MONITOR STUDY performed on 02/12/2021 Patch Wear Time:  14 days and 0 hours (2022-07-12T16:32:15-0400 to 2022-07-26T16:32:19-0400) Patient had a min HR of 40 bpm, max HR of 182 bpm, and avg HR of 70 bpm.  Predominant underlying rhythm was Sinus Rhythm.  1 run of Ventricular Tachycardia occurred lasting 17 beats with a max rate of 154 bpm (avg 136 bpm).  88 Supraventricular Tachycardia runs occurred, the run with the fastest interval lasting 5 beats with a max rate of 182 bpm, the longest lasting 14.7 secs with an avg rate of 103 bpm.  1 Pause occurred lasting 4.1 secs (15 bpm). True duration of Pause difficult to ascertain due to artifact.  Occasional PACs and rare PVCs.   Impression and Plan:  Benjamin Brewer has been referred for pre-anesthesia  review and clearance prior to him undergoing the planned anesthetic and procedural courses. Available labs, pertinent testing, and imaging results were personally reviewed by me in preparation for upcoming operative/procedural course. [facility] medical record has been updated following extensive record review and patient interview with PAT staff.   This patient has been appropriately cleared by cardiology with an overall ACCEPTABLE risk of significant perioperative cardiovascular complications. Completed perioperative prescription for cardiac device management documentation completed by primary cardiology team and placed on patient's chart for review by the surgical/anesthetic team on the day of his procedure. Electrophysiology indicating that procedure should not interfere with planned surgical procedure. Beyond normal perioperative cardiovascular monitoring, there are no recommendations from electrophysiology team that prompt further discussion/recommendations from industry representative.   Based on clinical review performed today (11/27/22), barring any significant acute changes in the patient's overall condition, it is anticipated that he will be able to proceed with the planned surgical intervention. Any acute changes in clinical condition may necessitate his procedure being postponed and/or cancelled. Patient will meet with anesthesia team (MD and/or CRNA) on the day of his procedure for preoperative evaluation/assessment. Questions regarding anesthetic course will be fielded at that time.   Pre-surgical instructions were reviewed with the patient during his PAT appointment, and questions were fielded to satisfaction by PAT clinical staff. He has been instructed on which medications that he will need to hold prior to surgery, as well as the ones that have been deemed safe/appropriate to take on the day of his procedure. As part of the general education provided by PAT, patient made aware both verbally  and in writing, that he would need to abstain from the use of any illegal substances during his perioperative course.  He was advised that failure to follow the provided instructions could necessitate case cancellation or result in serious perioperative complications up to and including death. Patient encouraged to contact PAT and/or his surgeon's office to discuss any questions or concerns that may arise prior to surgery; verbalized understanding.   Quentin Mulling, MSN, APRN, FNP-C, CEN [facility]  Peri-operative Services Nurse Practitioner Phone: [phone] Fax: [phone] 11/27/22 9:09 AM  NOTE: This note has been prepared using Dragon dictation software. Despite my best ability to proofread, there is always the potential that unintentional transcriptional errors may still occur from this process.

## 2022-11-30 ENCOUNTER — Encounter
Admission: RE | Disposition: A | Discharge status: Home / Self Care | Payer: Self-pay | Source: Home / Self Care | Attending: Urology

## 2022-11-30 ENCOUNTER — Ambulatory Visit
Admission: RE | Admit: 2022-11-30 | Discharge: 2022-11-30 | Disposition: A | Discharge status: Home / Self Care | Payer: PPO | Attending: Urology | Admitting: Urology

## 2022-11-30 ENCOUNTER — Ambulatory Visit: Payer: PPO | Admitting: Urgent Care

## 2022-11-30 ENCOUNTER — Other Ambulatory Visit: Payer: Self-pay

## 2022-11-30 ENCOUNTER — Encounter: Payer: Self-pay | Admitting: Urology

## 2022-11-30 DIAGNOSIS — M5412 Radiculopathy, cervical region: Secondary | ICD-10-CM | POA: Insufficient documentation

## 2022-11-30 DIAGNOSIS — I4891 Unspecified atrial fibrillation: Secondary | ICD-10-CM | POA: Diagnosis not present

## 2022-11-30 DIAGNOSIS — E785 Hyperlipidemia, unspecified: Secondary | ICD-10-CM | POA: Insufficient documentation

## 2022-11-30 DIAGNOSIS — N400 Enlarged prostate without lower urinary tract symptoms: Secondary | ICD-10-CM | POA: Insufficient documentation

## 2022-11-30 DIAGNOSIS — K219 Gastro-esophageal reflux disease without esophagitis: Secondary | ICD-10-CM | POA: Diagnosis not present

## 2022-11-30 DIAGNOSIS — Z955 Presence of coronary angioplasty implant and graft: Secondary | ICD-10-CM | POA: Diagnosis not present

## 2022-11-30 DIAGNOSIS — I2510 Atherosclerotic heart disease of native coronary artery without angina pectoris: Secondary | ICD-10-CM | POA: Diagnosis not present

## 2022-11-30 DIAGNOSIS — Z79899 Other long term (current) drug therapy: Secondary | ICD-10-CM | POA: Diagnosis not present

## 2022-11-30 DIAGNOSIS — N471 Phimosis: Secondary | ICD-10-CM | POA: Insufficient documentation

## 2022-11-30 DIAGNOSIS — N475 Adhesions of prepuce and glans penis: Secondary | ICD-10-CM | POA: Insufficient documentation

## 2022-11-30 DIAGNOSIS — I129 Hypertensive chronic kidney disease with stage 1 through stage 4 chronic kidney disease, or unspecified chronic kidney disease: Secondary | ICD-10-CM | POA: Insufficient documentation

## 2022-11-30 DIAGNOSIS — D649 Anemia, unspecified: Secondary | ICD-10-CM | POA: Insufficient documentation

## 2022-11-30 DIAGNOSIS — N183 Chronic kidney disease, stage 3 unspecified: Secondary | ICD-10-CM | POA: Diagnosis not present

## 2022-11-30 DIAGNOSIS — Z7902 Long term (current) use of antithrombotics/antiplatelets: Secondary | ICD-10-CM | POA: Diagnosis not present

## 2022-11-30 HISTORY — DX: Sick sinus syndrome: I49.5

## 2022-11-30 HISTORY — DX: Phimosis: N47.1

## 2022-11-30 HISTORY — DX: Atherosclerosis of aorta: I70.0

## 2022-11-30 HISTORY — DX: Other long term (current) drug therapy: Z79.899

## 2022-11-30 HISTORY — PX: DORSAL SLIT: SHX6822

## 2022-11-30 HISTORY — DX: Chronic kidney disease, stage 3 unspecified: N18.30

## 2022-11-30 HISTORY — DX: Other forms of dyspnea: R06.09

## 2022-11-30 HISTORY — DX: Primary osteoarthritis, right hand: M19.041

## 2022-11-30 HISTORY — DX: Other ill-defined heart diseases: I51.89

## 2022-11-30 HISTORY — DX: Presence of dental prosthetic device (complete) (partial): Z97.2

## 2022-11-30 HISTORY — DX: Angina pectoris, unspecified: I20.9

## 2022-11-30 SURGERY — DORSAL SLIT, PREPUCE
Anesthesia: General

## 2022-11-30 MED ORDER — BACITRACIN ZINC 500 UNIT/GM EX OINT
TOPICAL_OINTMENT | CUTANEOUS | Status: DC | PRN
  Administered 2022-11-30: 1 via TOPICAL

## 2022-11-30 MED ORDER — PROPOFOL 10 MG/ML IV BOLUS
INTRAVENOUS | Status: DC | PRN
  Administered 2022-11-30: 20 mg via INTRAVENOUS

## 2022-11-30 MED ORDER — CHLORHEXIDINE GLUCONATE 0.12 % MT SOLN
15.0000 mL | Freq: Once | OROMUCOSAL | Status: AC
  Administered 2022-11-30: 15 mL via OROMUCOSAL

## 2022-11-30 MED ORDER — CEFAZOLIN SODIUM-DEXTROSE 2-4 GM/100ML-% IV SOLN
INTRAVENOUS | Status: AC
  Filled 2022-11-30: qty 100

## 2022-11-30 MED ORDER — CHLORHEXIDINE GLUCONATE 0.12 % MT SOLN
OROMUCOSAL | Status: AC
  Filled 2022-11-30: qty 15

## 2022-11-30 MED ORDER — DEXMEDETOMIDINE HCL IN NACL 80 MCG/20ML IV SOLN
INTRAVENOUS | Status: DC | PRN
  Administered 2022-11-30 (×2): 4 ug via INTRAVENOUS

## 2022-11-30 MED ORDER — CEFAZOLIN SODIUM-DEXTROSE 2-4 GM/100ML-% IV SOLN
2.0000 g | INTRAVENOUS | Status: AC
  Administered 2022-11-30: 2 g via INTRAVENOUS

## 2022-11-30 MED ORDER — BACITRACIN ZINC 500 UNIT/GM EX OINT
TOPICAL_OINTMENT | CUTANEOUS | Status: AC
  Filled 2022-11-30: qty 28.35

## 2022-11-30 MED ORDER — LIDOCAINE HCL (PF) 1 % IJ SOLN
INTRAMUSCULAR | Status: AC
  Filled 2022-11-30: qty 30

## 2022-11-30 MED ORDER — LACTATED RINGERS IV SOLN: INTRAVENOUS | Status: DC

## 2022-11-30 MED ORDER — PROPOFOL 500 MG/50ML IV EMUL
INTRAVENOUS | Status: DC | PRN
  Administered 2022-11-30: 75 ug/kg/min via INTRAVENOUS

## 2022-11-30 MED ORDER — LIDOCAINE HCL 1 % IJ SOLN
INTRAMUSCULAR | Status: DC | PRN
  Administered 2022-11-30: 20 mL

## 2022-11-30 MED ORDER — ORAL CARE MOUTH RINSE: 15.0000 mL | Freq: Once | OROMUCOSAL | Status: AC

## 2022-11-30 SURGICAL SUPPLY — 29 items
APL PRP STRL LF DISP 70% ISPRP (MISCELLANEOUS) ×1
BLADE SURG 15 STRL LF DISP TIS (BLADE) ×1 IMPLANT
BLADE SURG 15 STRL SS (BLADE) ×1
BNDG CMPR 75X21 PLY HI ABS (MISCELLANEOUS) ×1
CHLORAPREP W/TINT 26 (MISCELLANEOUS) ×1 IMPLANT
DRAPE LAPAROTOMY 77X122 PED (DRAPES) ×1 IMPLANT
ELECT CAUTERY NEEDLE TIP 1.0 (MISCELLANEOUS)
ELECT REM PT RETURN 9FT ADLT (ELECTROSURGICAL) ×1
ELECTRODE CAUTERY NEDL TIP 1.0 (MISCELLANEOUS) ×1 IMPLANT
ELECTRODE REM PT RTRN 9FT ADLT (ELECTROSURGICAL) ×1 IMPLANT
GAUZE 4X4 16PLY ~~LOC~~+RFID DBL (SPONGE) ×1 IMPLANT
GAUZE PETROLATUM 1 X8 (GAUZE/BANDAGES/DRESSINGS) ×1 IMPLANT
GAUZE STRETCH 2X75IN STRL (MISCELLANEOUS) ×1 IMPLANT
GLOVE BIO SURGEON STRL SZ 6.5 (GLOVE) ×1 IMPLANT
GOWN STRL REUS W/ TWL LRG LVL3 (GOWN DISPOSABLE) ×2 IMPLANT
GOWN STRL REUS W/TWL LRG LVL3 (GOWN DISPOSABLE) ×2
KIT TURNOVER KIT A (KITS) ×1 IMPLANT
LABEL OR SOLS (LABEL) ×1 IMPLANT
MANIFOLD NEPTUNE II (INSTRUMENTS) ×1 IMPLANT
NDL HYPO 25X1 1.5 SAFETY (NEEDLE) ×1 IMPLANT
NEEDLE HYPO 25X1 1.5 SAFETY (NEEDLE) ×1 IMPLANT
NS IRRIG 500ML POUR BTL (IV SOLUTION) ×1 IMPLANT
PACK BASIN MINOR ARMC (MISCELLANEOUS) ×1 IMPLANT
SOL PREP PVP 2OZ (MISCELLANEOUS) ×1
SOLUTION PREP PVP 2OZ (MISCELLANEOUS) ×1 IMPLANT
SUT MNCRL AB 4-0 PS2 18 (SUTURE) ×3 IMPLANT
SYR 10ML LL (SYRINGE) ×1 IMPLANT
TRAP FLUID SMOKE EVACUATOR (MISCELLANEOUS) ×1 IMPLANT
WATER STERILE IRR 500ML POUR (IV SOLUTION) ×1 IMPLANT

## 2022-11-30 NOTE — Anesthesia Procedure Notes (Signed)
Procedure Name: MAC Date/Time: 11/30/2022 2:20 PM  Performed by: Elmarie Mainland, CRNAPre-anesthesia Checklist: Patient identified, Emergency Drugs available, Suction available and Patient being monitored Patient Re-evaluated:Patient Re-evaluated prior to induction Oxygen Delivery Method: Simple face mask

## 2022-11-30 NOTE — Op Note (Signed)
Date of procedure: 11/30/22  Preoperative diagnosis:  Phimosis  Postoperative diagnosis:  Phimosis  Procedure: Penile dorsal slit with penile block  Surgeon: Vanna Scotland, MD  Anesthesia: MAC  Complications: None  Intraoperative findings: Severe phimosis, unable to retract foreskin.  EBL: Minimal  Specimens: None  Drains: None  Indication: Benjamin Brewer is a 89 y.o. patient with severe phimosis.  After reviewing the management options for treatment, he elected to proceed with the above surgical procedure(s). We have discussed the potential benefits and risks of the procedure, side effects of the proposed treatment, the likelihood of the patient achieving the goals of the procedure, and any potential problems that might occur during the procedure or recuperation. Informed consent has been obtained.  Description of procedure:  The patient was taken to the operating room and general anesthesia was induced.  The patient was placed in the supine position, prepped and draped in the usual sterile fashion, and preoperative antibiotics were administered. A preoperative time-out was performed.   First a penile block with 10 cc of 1% lidocaine was performed as well as a ring block at the base of the penis with additional 10 cc.  The phimosis was severe and was unable to retract the foreskin.  I used forceps to dilate in order to open the foreskin somewhat.  I used a straight clamp dorsally to crush the tissue and then tenotomy scissors to incise it such that I was then able to retract the foreskin.  Additional Betadine solution was used to clean the glans and foreskin.  There were some adhesive bands especially ventrally with some skin erosion noted.  I then reapproximated the shaft skin to the foreskin using a simple interrupted technique lining the 2 skin edges up dorsally.  Hemostasis was achieved additionally using Bovie electrocautery.  At the end of the procedure, the adhesions have  been completely taken down and the entire glans was seen without any fibrotic bands.  The patient was phallus was then cleaned and dried and bacitracin conformer and a sleeve are applied to the penis.  He was then reversed from anesthesia and taken the PACU in stable condition.  Dressing to stay for 2 days.  Follow-up for wound check in a month.  Vanna Scotland, M.D.

## 2022-11-30 NOTE — Anesthesia Preprocedure Evaluation (Signed)
Anesthesia Evaluation  Patient identified by MRN, date of birth, ID band Patient awake    Reviewed: Allergy & Precautions, H&P , NPO status , Patient's Chart, lab work & pertinent test results  Airway Mallampati: III  TM Distance: >3 FB Neck ROM: Full    Dental  (+) Poor Dentition, Chipped, Dental Advidsory Given   Pulmonary neg pulmonary ROS   Pulmonary exam normal        Cardiovascular Exercise Tolerance: Good hypertension, + CAD and + Cardiac Stents (LAD and RCA stents in 2009)  negative cardio ROS Normal cardiovascular exam     Neuro/Psych        BPPVnegative neurological ROS  negative psych ROS   GI/Hepatic negative GI ROS, Neg liver ROS,GERD  Controlled,,  Endo/Other  negative endocrine ROS    Renal/GU Renal diseasenegative Renal ROS  negative genitourinary   Musculoskeletal  (+) Arthritis , Osteoarthritis,    Abdominal Normal abdominal exam  (+)   Peds  Hematology negative hematology ROS (+)   Anesthesia Other Findings MARSEAN HOEGER is a 89 y.o. male who is submitted for pre-surgical anesthesia review and clearance prior to him undergoing the above procedure. Patient has never been a smoker. Pertinent PMH includes: CAD, atrial fibrillation, NSVT, PSVT, diastolic dysfunction, tachybradycardia syndrome (s/p PPM placement), angina, aortic root dilatation, HTN, HLD, CKD-III, DOE, GERD (on daily PPI), anemia, BPH, phimosis, lower extremity paresthesias, vertigo, OA.   Patient is followed by cardiology Kirke Corin, MD). He was last seen in the cardiology clinic on 06/16/2022; notes reviewed. At the time of his clinic visit, patient doing well overall from a cardiovascular perspective.  Patient experiencing intermittent episodes of tachypalpitations.  Of note, patient has previously been advised to increase his beta-blocker dose to 50 mg twice daily, however he had only been taking metoprolol succinate 25 mg once daily.   Patient had recently increased to the recommended 50 mg twice daily and noticed a improvement in his perceived palpitation burden. Patient denied any chest pain, shortness of breath, PND, orthopnea, significant peripheral edema, weakness, fatigue, vertiginous symptoms, or presyncope/syncope. Patient with a past medical history significant for cardiovascular diagnoses. Documented physical exam was grossly benign, providing no evidence of acute exacerbation and/or decompensation of the patient's known cardiovascular conditions.    Patient underwent diagnostic LEFT heart catheterization on 12/05/2002 revealing multivessel CAD; 90% proximal diagonal, 30% proximal LCx, 30% mid LCx, 20% proximal OM1, 40% proximal RCA, and 30% mid RCA.  Interventional radiology made the decision to defer intervention opting for medical management.    Patient experiencing unstable angina.  He underwent evaluation via repeat LEFT heart catheterization on 05/15/2008.  Study revealed multivessel CAD; 99% proximal RCA and 80% mid LAD.  PCI  was subsequently performed placing a 2.75 x 15 mm Promus DES x 1 to the proximal RCA and a 3.5 x 18 mm Promus DES x 1 to the mid LAD.  Procedure yielded excellent angiographic result and TIMI-3 flow.    Long-term cardiac event monitor study performed on 01/12/2018 revealed runs of PSVT with the longest lasting 14 beats at a maximum rate of 142 bpm.  Repeat study performed on 02/12/2021 revealed 88 runs of PSVT with the longest lasting 5 beats at a maximum rate of 182 bpm.  Additionally, study revealed a 17 beat run of NSVT occurring at a maximum rate of 154 bpm.    Patient with a history of tachybradycardia syndrome, which ultimately required resynchronization therapy.  Patient underwent placement of a Medtronic Azure XT  DR MRI SureScan dual-chamber pacemaker placed on 05/05/2021.  Device is regularly interrogated by patient's primary electrophysiology team.  Last interrogation was on 11/03/2022,  at which time device was functioning normally.    Most recent TTE was performed on 04/25/2021 revealing a normal left ventricular systolic function with an EF of 60-65%.  There were no regional wall motion abnormalities.  There was mild LVH. Left ventricular diastolic Doppler parameters consistent with abnormal relaxation (G1DD). Right ventricular size and function normal.  There was mild to moderate mitral valve regurgitation.  Mild to moderate sclerosis/calcification of the aortic valve was observed. All transvalvular gradients were noted to be normal providing no evidence suggestive of valvular stenosis.   Patient with an atrial fibrillation diagnosis; CHA2DS2-VASc Score = 4 (age x 2, HTN, vascular disease history). His rate and rhythm are currently being maintained on oral amiodarone + diltiazem + metoprolol succinate. He is chronically anticoagulated using apixaban; reported to be compliant with therapy with no evidence or reports of GI bleeding.  Blood pressure well controlled at 110/72 mmHg on currently prescribed ARB (losartan) and beta-blocker (metoprolol succinate) therapies. He is on a atorvastatin for his HLD diagnosis and further ASCVD prevention.  Patient has a supply of short acting nitrates (NTG) to use on a as needed basis for recurrent angina/anginal equivalent symptoms.  Patient is not diabetic.  He does not have an OSAH diagnosis. Functional capacity, as defined by DASI, is documented as being >/= 4 METS.  No changes were made to his medication history.  Patient to continue regularly scheduled visits with electrophysiology for implanted cardiac device management.  He was scheduled to follow-up with outpatient cardiology in 6 months or sooner if needed.   Brett Canales is scheduled for an DORSAL SLIT (PENIS) on 11/30/2022 with Dr. Vanna Scotland, MD.  Given patient's past medical history significant for cardiovascular diagnoses, presurgical cardiac clearance was sought by the PAT team.   Per cardiology, "according to the Revised Cardiac Risk Index (RCRI), his Perioperative Risk of Major Cardiac Event is (%): 0.9. His Functional Capacity in METs is: 7.59 according to the Duke Activity Status Index (DASI). The patient is doing well from a cardiac perspective. Therefore, based on ACC/AHA guidelines, the patient would be at ACCEPTABLE risk for the planned procedure without further cardiovascular testing".   Again, this patient is on daily oral anticoagulation therapy He has been instructed on recommendations for holding his apixaban for 3 days prior to his procedure with plans to restart as soon as postoperative bleeding risk felt to be minimized by his attending surgeon. The patient has been instructed that his last dose of his apixaban should be on 11/26/2022.   Patient denies previous perioperative complications with anesthesia in the past. In review of the available records, it is noted that patient underwent a MAC anesthetic course at [facility] ([facility]) in 06/2019 without documented complications.    Past Medical History: No date: A-fib [facility])     Comment:  a.) CHA2DS2VASc = 4 (age x 2, HTN, vascular disease               history); b.) rate/rhythm maintained on oral amiodarone +              diltiazem + metoprolol succinate; chronically               anticoagulated with apixaban No date: Anemia No date: Angina pectoris (HCC) No date: Aortic atherosclerosis (HCC) 05/09/2020: Aortic root dilatation (HCC)  Comment:  a.) TTE 05/09/2020: 38 mm; b.) TTE 02/04/2022: 42 mm No date: Arthritis of both hands No date: Benign prostatic hypertrophy No date: CAD (coronary artery disease)     Comment:  a.) LHC 12/05/2002: 90% prox diagonal, 30% pLCx, 30%               mLCx, 20% pOM1, 40% pRCA, 30% mRCA - med mgmt; b.)               LHC/PCI 05/15/2008: 99% pRCA (2.75 x 15 mm Promus DES),               80% mLAD (3.5 x 18 mm Promus DES) No date: CKD (chronic kidney disease),  stage III (HCC) No date: Dental root implant present No date: Diastolic dysfunction     Comment:  a.) TTE 09/04/2014: EF 55-60%, RVE, LAE, mild MR, G1DD;               b.) TTE 01/15/2018: EF 60-65%, LAE, AoV sclerosis, triv               MR/TR/PR, G1DD; c.) TTE 05/09/2020: EF 55-60%, LAD,               mild-mod AoV sclerosis, G1DD; d.) TTE 04/25/2021: EF               60-65%, LVH, mild-mod MR, G1DD; e.) TTE 02/04/2022: EF               60-65%, mod LVH, LAE, mod RAE, AoV sclerosis, triv MR,               G1DD No date: DOE (dyspnea on exertion) No date: Dyslipidemia No date: GERD (gastroesophageal reflux disease) 11/2020: History of 2019 novel coronavirus disease (COVID-19) No date: Hyperlipidemia No date: Hypertension No date: Long term current use of amiodarone 02/12/2021: NSVT (nonsustained ventricular tachycardia) (HCC)     Comment:  a.) holter 02/12/2021: 17 beat run at a maximum rate of               154 bpm No date: Numbness and tingling of both lower extremities No date: Phimosis of penis No date: Pneumonia 05/05/2021: Presence of permanent cardiac pacemaker     Comment:  a.) s/p dual-chamber PPM 05/05/2021; MDT Azure XT DR MRI              SureScan (SN: [id number]) 01/12/2018: PSVT (paroxysmal supraventricular tachycardia)     Comment:  a.) holter 01/12/2018: PSVT runs --> longest lasting 14               beats at max rate of 142 bpm; b.) holter 02/12/2021: PSVT              x 88 runs  --> longest lasting 5 beats at a maximum rat               of 182 bpm No date: Tachy-brady syndrome (HCC)     Comment:  a.) s/p PPM placement 05/05/2021 No date: Vertigo  Past Surgical History: No date: CARDIAC CATHETERIZATION     Comment:  [facility] 07/03/2019: CATARACT EXTRACTION W/PHACO; Left     Comment:  Procedure: CATARACT EXTRACTION PHACO AND INTRAOCULAR               LENS PLACEMENT (IOC) LEFT 10.19  01:06.9;  Surgeon: Nevada Crane, MD;  Location: [facility]  SURGERY CNTR;  Service: Ophthalmology;  Laterality: Left; No date: COLONOSCOPY 05/15/2008: CORONARY ANGIOPLASTY WITH STENT PLACEMENT; Left     Comment:  Procedure: CORONARY ANGIOPLASTY WITH STENT PLACEMENT;               Location: [facility]; Surgeon: Melene Muller, MD 06/10/2015: ESOPHAGOGASTRODUODENOSCOPY (EGD) WITH PROPOFOL; N/A     Comment:  Procedure: ESOPHAGOGASTRODUODENOSCOPY (EGD) WITH               PROPOFOL;  Surgeon: Scot Jun, MD;  Location: [facility]              ENDOSCOPY;  Service: Endoscopy;  Laterality: N/A; No date: HERNIA REPAIR 12/05/2002: LEFT HEART CATH AND CORONARY ANGIOGRAPHY; Left     Comment:  Procedure: CORONARY ANGIOPLASTY WITH STENT PLACEMENT;               Location: [facility]; Surgeon: Randa Evens, MD 05/05/2021: PACEMAKER IMPLANT; N/A     Comment:  Procedure: PACEMAKER IMPLANT;  Surgeon: [name], MD;  Location: [facility] INVASIVE CV LAB;  Service:               Cardiovascular;  Laterality: N/A; 06/10/2015: SAVORY DILATION; N/A     Comment:  Procedure: SAVORY DILATION;  Surgeon: Scot Jun,               MD;  Location: [facility] ENDOSCOPY;  Service: Endoscopy;                Laterality: N/A; No date: TONSILLECTOMY  BMI    Body Mass Index: 29.41 kg/m      Reproductive/Obstetrics negative OB ROS                             Anesthesia Physical Anesthesia Plan  ASA: 3  Anesthesia Plan: General   Post-op Pain Management:    Induction: Intravenous  PONV Risk Score and Plan: 1 and Ondansetron, Propofol infusion, TIVA and Treatment may vary due to age or medical condition  Airway Management Planned: Nasal Cannula  Additional Equipment: None  Intra-op Plan:   Post-operative Plan:   Informed Consent: I have reviewed the patients History and Physical, chart, labs and discussed the procedure including the risks, benefits and alternatives for the proposed anesthesia with the  patient or authorized representative who has indicated his/her understanding and acceptance.     Dental Advisory Given  Plan Discussed with: CRNA  Anesthesia Plan Comments: (Patient consented for risks of anesthesia including but not limited to:  - adverse reactions to medications - risk of airway placement if required - damage to eyes, teeth, lips or other oral mucosa - nerve damage due to positioning  - sore throat or hoarseness - Damage to heart, brain, nerves, lungs, other parts of body or loss of life  Patient voiced understanding.)        Anesthesia Quick Evaluation

## 2022-11-30 NOTE — Transfer of Care (Signed)
Immediate Anesthesia Transfer of Care Note  Patient: Benjamin Brewer  Procedure(s) Performed: DORSAL SLIT  Patient Location: PACU  Anesthesia Type:General  Level of Consciousness: drowsy and patient cooperative  Airway & Oxygen Therapy: Patient Spontanous Breathing and Patient connected to face mask oxygen  Post-op Assessment: Report given to RN and Post -op Vital signs reviewed and stable  Post vital signs: Reviewed and stable  Last Vitals:  Vitals Value Taken Time  BP 103/63 11/30/22 1452  Temp    Pulse 59 11/30/22 1455  Resp 14 11/30/22 1455  SpO2 100 % 11/30/22 1455  Vitals shown include unvalidated device data.  Last Pain:  Vitals:   11/30/22 1255  TempSrc: Temporal  PainSc: 0-No pain         Complications: No notable events documented.

## 2022-11-30 NOTE — H&P (Signed)
11/30/22  RRR CTAB  Benjamin Brewer March 19, 1931 [id number]   Referring provider: [name], MD [phone] [facility] [place],  [place] [address]   Urological history: 1. BPH -PSA (05/2020) 0.86      Chief Complaint  Patient presents with   Follow-up      HPI: Benjamin Brewer is a 89 y.o. male who presents today for 6 weeks after a trial of Mycolog BID for phimosis.    At his visit on 08/12/2022, he states that the Mycolog cream did not really change his situation.  He started experiencing excruciating neck pain that is radiating to his right arm over the weekend.  He was seen in urgent care for this and prescribed some medicine, but it is to continue to get worse.  He is going to seek care in the emergency department when he leaves here.   He was given a script for Diprolene.    He was seen in the [facility] ED and has follow up with his PCP for cervical radiculopathy.     He stated the Diprolene cream made no difference and he would like to proceed with a Dorsal slit.     He also has issues with nocturia x 2-3 nightly.  He does admit though that he drinks a large amount of fluid during the day.  Patient denies any modifying or aggravating factors.  Patient denies any gross hematuria, dysuria or suprapubic/flank pain.  Patient denies any fevers, chills, nausea or vomiting.     PMH:     Past Medical History:  Diagnosis Date   A-fib [facility])     Arthritis      hands   Benign prostatic hypertrophy      Hx of    CAD (coronary artery disease)      native vessel. Status post LAD and RCA angioplasty and drug-eluting stent placement in 2009   Dental bridge present      implants, no bridge   Dizziness     Dyslipidemia     GERD (gastroesophageal reflux disease)     History of tachycardia     Hyperlipidemia     Hypertension     Intermediate coronary syndrome (HCC)      angina, unstable   Numbness and tingling of both lower extremities      Other dyspnea and respiratory abnormality      on exertion   Pinched nerve in neck      some discomfort with side to side movement   Vertigo      approx 1x/month      Surgical History:      Past Surgical History:  Procedure Laterality Date   CARDIAC CATHETERIZATION        [place]; x2 (last stent 2009)   CARDIAC CATHETERIZATION        [facility]   CARDIAC CATHETERIZATION        [facility]   CATARACT EXTRACTION W/PHACO Left 07/03/2019    Procedure: CATARACT EXTRACTION PHACO AND INTRAOCULAR LENS PLACEMENT (IOC) LEFT 10.19  01:06.9;  Surgeon: Nevada Crane, MD;  Location: [facility] SURGERY CNTR;  Service: Ophthalmology;  Laterality: Left;   ESOPHAGOGASTRODUODENOSCOPY (EGD) WITH PROPOFOL N/A 06/10/2015    Procedure: ESOPHAGOGASTRODUODENOSCOPY (EGD) WITH PROPOFOL;  Surgeon: Scot Jun, MD;  Location: [facility] ENDOSCOPY;  Service: Endoscopy;  Laterality: N/A;   HERNIA REPAIR       PACEMAKER IMPLANT N/A 05/05/2021    Procedure: PACEMAKER IMPLANT;  Surgeon: [name], MD;  Location: [facility] INVASIVE CV LAB;  Service: Cardiovascular;  Laterality: N/A;   SAVORY DILATION N/A 06/10/2015    Procedure: SAVORY DILATION;  Surgeon: Scot Jun, MD;  Location: [facility] ENDOSCOPY;  Service: Endoscopy;  Laterality: N/A;   TONSILLECTOMY          Home Medications:  Allergies as of 09/01/2022         Reactions    Prednisone      "they think threw me into A-fib"    Terazosin Other (See Comments)    Muscle Pain            Medication List           Accurate as of September 01, 2022 12:38 PM. If you have any questions, ask your nurse or doctor.              atorvastatin 40 MG tablet Commonly known as: LIPITOR TAKE 1 TABLET BY MOUTH EVERY MORNING    betamethasone dipropionate 0.05 % cream Apply topically 2 (two) times daily.    [employer] Fiber Gummies Chew Chew by mouth.    cyanocobalamin 500 MCG tablet Commonly known as: VITAMIN B12 Take 500 mcg by mouth 2 (two) times daily.     diltiazem 30 MG tablet Commonly known as: CARDIZEM TAKE ONE TABLET EVERY FOUR HOURS AS NEEDED FOR AFIB HEART RATE >100    Eliquis 5 MG Tabs tablet Generic drug: apixaban TAKE 1 TABLET BY MOUTH TWICE DAILY    ferrous sulfate 325 (65 FE) MG tablet Take 325 mg by mouth daily with breakfast.    finasteride 5 MG tablet Commonly known as: PROSCAR Take 5 mg by mouth daily.    gabapentin 300 MG capsule Commonly known as: NEURONTIN Take 300 mg by mouth at bedtime.    gabapentin 300 MG capsule Commonly known as: NEURONTIN Take 1 capsule by mouth 3 (three) times daily.    ipratropium 0.03 % nasal spray Commonly known as: ATROVENT 1 spray as needed.    losartan 25 MG tablet Commonly known as: COZAAR TAKE 1 TABLET BY MOUTH DAILY    LUBRICATING EYE DROPS OP Place 1 drop into both eyes daily as needed (dry eyes).    metoprolol succinate 25 MG 24 hr tablet Commonly known as: TOPROL-XL Take 25 mg by mouth 2 (two) times daily at 10 AM and 5 PM.    multivitamin tablet Take 1 tablet by mouth daily.    nitroGLYCERIN 0.4 MG SL tablet Commonly known as: NITROSTAT Place 1 nitroglycerin under your tongue, while sitting. If no relief of pain, may repeat 1 tablet every 5 minutes up to 3 tablets over 15 minutes.    nystatin-triamcinolone ointment Commonly known as: MYCOLOG Apply 1 Application topically 2 (two) times daily.    pantoprazole 40 MG tablet Commonly known as: PROTONIX TAKE 1 TABLET BY MOUTH NIGHTLY    PreserVision AREDS 2 Caps Take 1 capsule by mouth 2 (two) times daily.    traMADol 50 MG tablet Commonly known as: ULTRAM Take 50 mg by mouth every 6 (six) hours as needed.    VITAMIN D3 PO Take 1 capsule by mouth daily.             Allergies:       Allergies  Allergen Reactions   Prednisone        "they think threw me into A-fib"   Terazosin Other (See Comments)      Muscle Pain  Family History:      Family History  Problem Relation Age of Onset    Stroke Mother     Heart attack Mother     Hypertension Mother     Stroke Other          family history      Social History:  reports that he has never smoked. He has never used smokeless tobacco. He reports current alcohol use of about 1.0 standard drink of alcohol per week. He reports that he does not use drugs.   ROS: Pertinent ROS in HPI   Physical Exam: BP (!) 178/105   Pulse 91   Ht 5\' 10"  (1.778 m)   Wt 205 lb (93 kg)   BMI 29.41 kg/m   Constitutional:  Well nourished. Alert and oriented, No acute distress. HEENT: [place] AT, moist mucus membranes.  Trachea midline Cardiovascular: No clubbing, cyanosis, or edema. Respiratory: Normal respiratory effort, no increased work of breathing. Neurologic: Grossly intact, no focal deficits, moving all 4 extremities. Psychiatric: Normal mood and affect.    Laboratory Data: Serum creatinine (07/2022) 1.23 I have reviewed the labs.    Pertinent Imaging: N/A   I have independently reviewed the films.     Assessment & Plan:     1. Phimosis -no improvement with Diprolene cream -discussed Dorsal Slit vs circumcision  -he would like to have the dorsal slit -Reviewed the risk of a dorsal slit such as infection or bleeding, penile sensitivity and dissatisfaction with cosmetic results -He stated being 89 years of age, he is not concerned about cosmetic results is much as functionality -Will obtain cardiac clearance to stop his anticoagulants -orders sent    Return for dorsal slit in the ED .   These notes generated with voice recognition software. I apologize for typographical errors.   Cloretta Ned   [facility] [address]  [address] [place], [place] [address] [phone]

## 2022-11-30 NOTE — Discharge Instructions (Addendum)
You have a dressing in place.  Please allow that to remain in place for 2 days.  Do not shower until it has been removed.  If it is too tight or you cannot urinate, you can remove it before hand.    Sutures will fall out on their own.  You can use bacitracin ointment to help keep the stitches from sticking to your underwear.    If there is any bleeding, hold gentle pressure.  AMBULATORY SURGERY  DISCHARGE INSTRUCTIONS   The drugs that you were given will stay in your system until tomorrow so for the next 24 hours you should not:  Drive an automobile Make any legal decisions Drink any alcoholic beverage   You may resume regular meals tomorrow.  Today it is better to start with liquids and gradually work up to solid foods.  You may eat anything you prefer, but it is better to start with liquids, then soup and crackers, and gradually work up to solid foods.   Please notify your doctor immediately if you have any unusual bleeding, trouble breathing, redness and pain at the surgery site, drainage, fever, or pain not relieved by medication.    Additional Instructions:  Please contact your physician with any problems or Same Day Surgery at [phone], Monday through Friday 6 am to 4 pm, or [facility] number at [phone].

## 2022-12-01 ENCOUNTER — Encounter: Payer: Self-pay | Admitting: Urology

## 2022-12-01 NOTE — Anesthesia Postprocedure Evaluation (Signed)
Anesthesia Post Note  Patient: Benjamin Brewer  Procedure(s) Performed: DORSAL SLIT  Patient location during evaluation: PACU Anesthesia Type: General Level of consciousness: awake and alert Pain management: pain level controlled Vital Signs Assessment: post-procedure vital signs reviewed and stable Respiratory status: spontaneous breathing, nonlabored ventilation, respiratory function stable and patient connected to nasal cannula oxygen Cardiovascular status: blood pressure returned to baseline and stable Postop Assessment: no apparent nausea or vomiting Anesthetic complications: no   No notable events documented.   Last Vitals:  Vitals:   11/30/22 1513 11/30/22 1524  BP:  (!) 155/92  Pulse: (!) 59 63  Resp: 15 17  Temp:  (!) 36.3 C  SpO2: 96% 97%    Last Pain:  Vitals:   11/30/22 1524  TempSrc: Temporal  PainSc: 2                  Stephanie Coup

## 2022-12-01 NOTE — Progress Notes (Signed)
Remote pacemaker transmission.   

## 2022-12-15 ENCOUNTER — Other Ambulatory Visit: Payer: Self-pay | Admitting: Cardiovascular Disease

## 2022-12-18 ENCOUNTER — Encounter: Payer: Self-pay | Admitting: Cardiovascular Disease

## 2022-12-18 ENCOUNTER — Ambulatory Visit: Payer: PPO | Attending: Cardiovascular Disease | Admitting: Cardiovascular Disease

## 2022-12-18 VITALS — BP 134/64 | HR 67 | Ht 70.0 in | Wt 204.1 lb

## 2022-12-18 DIAGNOSIS — I2510 Atherosclerotic heart disease of native coronary artery without angina pectoris: Secondary | ICD-10-CM | POA: Diagnosis not present

## 2022-12-18 DIAGNOSIS — E785 Hyperlipidemia, unspecified: Secondary | ICD-10-CM

## 2022-12-18 DIAGNOSIS — I10 Essential (primary) hypertension: Secondary | ICD-10-CM

## 2022-12-18 DIAGNOSIS — I4891 Unspecified atrial fibrillation: Secondary | ICD-10-CM

## 2022-12-18 NOTE — Patient Instructions (Signed)
Medication Instructions:  No changes *If you need a refill on your cardiac medications before your next appointment, please call your pharmacy*   Lab Work: None ordered If you have labs (blood work) drawn today and your tests are completely normal, you will receive your results only by: MyChart Message (if you have MyChart) OR A paper copy in the mail If you have any lab test that is abnormal or we need to change your treatment, we will call you to review the results.   Testing/Procedures: None ordered   Follow-Up: At [facility], you and your health needs are our priority.  As part of our continuing mission to provide you with exceptional heart care, we have created designated Provider Care Teams.  These Care Teams include your primary Cardiologist (physician) and Advanced Practice Providers (APPs -  Physician Assistants and Nurse Practitioners) who all work together to provide you with the care you need, when you need it.  We recommend signing up for the patient portal called "MyChart".  Sign up information is provided on this After Visit Summary.  MyChart is used to connect with patients for Virtual Visits (Telemedicine).  Patients are able to view lab/test results, encounter notes, upcoming appointments, etc.  Non-urgent messages can be sent to your provider as well.   To learn more about what you can do with MyChart, go to [email or web address].    Your next appointment:   3 month(s)  Provider:   You may see Muhammad Arida, MD or one of the following Advanced Practice Providers on your designated Care Team:   Christopher Berge, NP Ryan Dunn, PA-C Cadence Furth, PA-C Sheri Hammock, NP     

## 2022-12-18 NOTE — Progress Notes (Signed)
Cardiology Office Note   Date:  12/18/2022   ID:  Benjamin Brewer, DOB 01-Sep-1930, MRN [id number]  PCP:  [name], MD  Cardiologist:   [name], MD   Chief Complaint  Patient presents with   Follow-up    6 month f/u c/o loss of vision since starting new medication x1 month since started. Meds reviewed verbally with pt.      History of Present Illness: Benjamin Brewer is a 89 y.o. male who presents for a followup visit regarding coronary artery disease and paroxysmal atrial fibrillation . He is status post angioplasty and drug-eluting stent placement to the LAD and RCA in 01/25/08.  He was diagnosed with atrial fibrillation with rapid ventricular response in 2015 and has been on anticoagulation since then.  Echocardiogram in July 2019 showed EF of 60 to 65%, calcified aortic valve without significant stenosis, mildly dilated left atrium and no significant pulmonary hypertension. The patient's wife died in 24-Jan-2018 and he had significant grief, anxiety and depression since then. He was diagnosed with cervical spine disease in 01-25-19.  Treatment with prednisone was associated with worsening atrial fibrillation.  He was diagnosed with COVID-19 infection in May of 2022 and had severe intermittent diarrhea since then.  In that setting, he had worsening palpitations with episodes of dizziness with a sense of pause in his heart.  A 14-day ZIO monitor was done which showed sinus rhythm with an average heart rate of 70 bpm.  There was 1 run of ventricular tachycardia that lasted 17 beats and he had 88 runs of supraventricular tachycardia the longest lasted 15 seconds.  He had one 4.1-second pause.  He underwent a permanent pacemaker placement in October 2022 for tachybradycardia syndrome.  He went to the emergency room in April with dizziness and tachycardia and was found to be in atrial fibrillation with rapid ventricular response.  His heart rate was 140 bpm.  He converted to sinus rhythm  with IV amiodarone and was discharged home on oral amiodarone.  He has been in sinus rhythm since then but he is concerned about decreased visual acuity as well as increased photosensitivity.  He does have known history of macular degeneration.   Past Medical History:  Diagnosis Date   A-fib [facility])    a.) CHA2DS2VASc = 4 (age x 2, HTN, vascular disease history); b.) rate/rhythm maintained on oral amiodarone + diltiazem + metoprolol succinate; chronically anticoagulated with apixaban   Anemia    Angina pectoris (HCC)    Aortic atherosclerosis (HCC)    Aortic root dilatation (HCC) 05/09/2020   a.) TTE 05/09/2020: 38 mm; b.) TTE 02/04/2022: 42 mm   Arthritis of both hands    Benign prostatic hypertrophy    CAD (coronary artery disease)    a.) LHC 12/05/2002: 90% prox diagonal, 30% pLCx, 30% mLCx, 20% pOM1, 40% pRCA, 30% mRCA - med mgmt; b.) LHC/PCI 05/15/2008: 99% pRCA (2.75 x 15 mm Promus DES), 80% mLAD (3.5 x 18 mm Promus DES)   CKD (chronic kidney disease), stage III (HCC)    Dental root implant present    Diastolic dysfunction    a.) TTE 09/04/2014: EF 55-60%, RVE, LAE, mild MR, G1DD; b.) TTE 01/15/2018: EF 60-65%, LAE, AoV sclerosis, triv MR/TR/PR, G1DD; c.) TTE 05/09/2020: EF 55-60%, LAD, mild-mod AoV sclerosis, G1DD; d.) TTE 04/25/2021: EF 60-65%, LVH, mild-mod MR, G1DD; e.) TTE 02/04/2022: EF 60-65%, mod LVH, LAE, mod RAE, AoV sclerosis, triv MR, G1DD   DOE (dyspnea on exertion)  Dyslipidemia    GERD (gastroesophageal reflux disease)    History of 2019 novel coronavirus disease (COVID-19) 11/2020   Hyperlipidemia    Hypertension    Long term current use of amiodarone    NSVT (nonsustained ventricular tachycardia) (HCC) 02/12/2021   a.) holter 02/12/2021: 17 beat run at a maximum rate of 154 bpm   Numbness and tingling of both lower extremities    Phimosis of penis    Pneumonia    Presence of permanent cardiac pacemaker 05/05/2021   a.) s/p dual-chamber PPM 05/05/2021; MDT Azure  XT DR MRI SureScan (SN: [id number])   PSVT (paroxysmal supraventricular tachycardia) 01/12/2018   a.) holter 01/12/2018: PSVT runs --> longest lasting 14 beats at max rate of 142 bpm; b.) holter 02/12/2021: PSVT x 88 runs  --> longest lasting 5 beats at a maximum rat of 182 bpm   Tachy-brady syndrome (HCC)    a.) s/p PPM placement 05/05/2021   Vertigo     Past Surgical History:  Procedure Laterality Date   CARDIAC CATHETERIZATION     [facility]   CATARACT EXTRACTION [facility] Left 07/03/2019   Procedure: CATARACT EXTRACTION PHACO AND INTRAOCULAR LENS PLACEMENT (IOC) LEFT 10.19  01:06.9;  Surgeon: Nevada Crane, MD;  Location: [facility];  Service: Ophthalmology;  Laterality: Left;   COLONOSCOPY     CORONARY ANGIOPLASTY WITH STENT PLACEMENT Left 05/15/2008   Procedure: CORONARY ANGIOPLASTY WITH STENT PLACEMENT; Location: [facility]; Surgeon: Melene Muller, MD   DORSAL SLIT N/A 11/30/2022   Procedure: DORSAL SLIT;  Surgeon: Vanna Scotland, MD;  Location: [facility] ORS;  Service: Urology;  Laterality: N/A;   ESOPHAGOGASTRODUODENOSCOPY (EGD) WITH PROPOFOL N/A 06/10/2015   Procedure: ESOPHAGOGASTRODUODENOSCOPY (EGD) WITH PROPOFOL;  Surgeon: Scot Jun, MD;  Location: [facility] ENDOSCOPY;  Service: Endoscopy;  Laterality: N/A;   HERNIA REPAIR     LEFT HEART CATH AND CORONARY ANGIOGRAPHY Left 12/05/2002   Procedure: CORONARY ANGIOPLASTY WITH STENT PLACEMENT; Location: [facility]; Surgeon: Randa Evens, MD   PACEMAKER IMPLANT N/A 05/05/2021   Procedure: PACEMAKER IMPLANT;  Surgeon: [name], MD;  Location: [facility] INVASIVE CV LAB;  Service: Cardiovascular;  Laterality: N/A;   SAVORY DILATION N/A 06/10/2015   Procedure: SAVORY DILATION;  Surgeon: Scot Jun, MD;  Location: [facility] ENDOSCOPY;  Service: Endoscopy;  Laterality: N/A;   TONSILLECTOMY       Current Outpatient Medications  Medication Sig Dispense Refill   amiodarone (PACERONE) 200 MG tablet Take 1 tablet  (200 mg total) by mouth daily. 30 tablet 3   atorvastatin (LIPITOR) 40 MG tablet TAKE 1 TABLET BY MOUTH EVERY MORNING 90 tablet 0   Carboxymethylcellul-Glycerin (LUBRICATING EYE DROPS OP) Place 1 drop into both eyes daily as needed (dry eyes).     Cholecalciferol (VITAMIN D3 PO) Take 1 capsule by mouth daily.     cyanocobalamin 500 MCG tablet Take 500 mcg by mouth 2 (two) times daily.     diltiazem (CARDIZEM) 30 MG tablet TAKE ONE TABLET EVERY FOUR HOURS AS NEEDED FOR AFIB HEART RATE >100 60 tablet 3   ELIQUIS 5 MG TABS tablet TAKE 1 TABLET BY MOUTH TWICE DAILY 60 tablet 6   ferrous sulfate 325 (65 FE) MG tablet Take 325 mg by mouth daily with breakfast.     Fiber-Vitamins-Minerals ([employer]S FIBER GUMMIES) CHEW Chew by mouth.     finasteride (PROSCAR) 5 MG tablet Take 5 mg by mouth daily.     ipratropium (ATROVENT) 0.03 % nasal spray 1 spray as needed.  losartan (COZAAR) 25 MG tablet TAKE 1 TABLET BY MOUTH DAILY 90 tablet 0   metoprolol succinate (TOPROL-XL) 25 MG 24 hr tablet Take 25 mg by mouth 2 (two) times daily at 10 AM and 5 PM.     Multiple Vitamin (MULTIVITAMIN) tablet Take 1 tablet by mouth daily.     Multiple Vitamins-Minerals (PRESERVISION AREDS 2) CAPS Take 1 capsule by mouth 2 (two) times daily.     nitroGLYCERIN (NITROSTAT) 0.4 MG SL tablet Place 1 nitroglycerin under your tongue, while sitting. If no relief of pain, may repeat 1 tablet every 5 minutes up to 3 tablets over 15 minutes. 25 tablet 3   pantoprazole (PROTONIX) 40 MG tablet TAKE 1 TABLET BY MOUTH NIGHTLY 30 tablet 11   No current facility-administered medications for this visit.    Allergies:   Prednisone and Terazosin    Social History:  The patient  reports that he has never smoked. He has never used smokeless tobacco. He reports current alcohol use of about 1.0 standard drink of alcohol per week. He reports that he does not use drugs.   Family History:  The patient's family history includes Heart attack in his  mother; Hypertension in his mother; Stroke in his mother and another family member.    ROS:  Please see the history of present illness.   Otherwise, review of systems are positive for none.   All other systems are reviewed and negative.    PHYSICAL EXAM: VS:  BP 134/64 (BP Location: Left Arm, Patient Position: Sitting, Cuff Size: Normal)   Pulse 67   Ht 5\' 10"  (1.778 m)   Wt 204 lb 2 oz (92.6 kg)   SpO2 97%   BMI 29.29 kg/m  , BMI Body mass index is 29.29 kg/m. GEN: Well nourished, well developed, in no acute distress  HEENT: normal  Neck: no JVD, carotid bruits, or masses Cardiac: RRR; no  rubs, or gallops,no edema .  1 /6 systolic murmur at the aortic area Respiratory:  clear to auscultation bilaterally, normal work of breathing GI: soft, nontender, nondistended, + BS MS: no deformity or atrophy  Skin: warm and dry, no rash Neuro:  Strength and sensation are intact Psych: euthymic mood, full affect   EKG:  EKG is ordered today. The ekg ordered today demonstrates atrial paced rhythm with low voltage and left axis deviation.  Inferior infarct not different from before.  Recent Labs: 10/29/2022: BUN 16; Creatinine, Ser 1.24; Hemoglobin 13.1; Platelets 261; Potassium 3.8; Sodium 139    Lipid Panel    Component Value Date/Time   CHOL 115 10/08/2012 0554   TRIG 75 10/08/2012 0554   HDL 43 10/08/2012 0554   VLDL 15 10/08/2012 0554   LDLCALC 57 10/08/2012 0554      Wt Readings from Last 3 Encounters:  12/18/22 204 lb 2 oz (92.6 kg)  11/30/22 205 lb (93 kg)  09/01/22 205 lb (93 kg)        ASSESSMENT AND PLAN:  1.  Coronary artery disease involving native coronary arteries without angina: He is doing well overall with no anginal symptoms.  No antiplatelet medication given that he is on anticoagulation.  2. Paroxysmal atrial fibrillation: He feels significantly better since he was placed on amiodarone in April.  He had a highly symptomatic episode at that time.  He is  concerned about decreased vision since he was started on amiodarone and photosensitivity.  He is known to have macular degeneration.  I explained to him that  the photosensitivity is likely due to amiodarone but decreased vision acuity is likely not.  I did give him the option of stopping amiodarone and increasing Toprol but he prefers to continue with current treatment as he feels much better with amiodarone in terms of his atrial fibrillation. I did ask him to follow-up with his ophthalmologist for an eye exam.  If this continues to be an issue, we can stop amiodarone and increase Toprol in 3 months.  3. Essential hypertension: Blood pressure is controlled.  4. Hyperlipidemia: I reviewed most recent lipid profile done in August which showed an LDL of 69 which is at target of less than 70.  Continue current dose of atorvastatin.    Disposition:   FU in 3 months.  Signed,  [name], MD  12/18/2022 8:39 AM    [facility]

## 2022-12-29 ENCOUNTER — Ambulatory Visit: Payer: PPO | Admitting: Physician Assistant

## 2022-12-29 ENCOUNTER — Encounter: Payer: Self-pay | Admitting: Urology

## 2022-12-29 ENCOUNTER — Ambulatory Visit: Payer: PPO | Admitting: Urology

## 2022-12-29 VITALS — BP 114/63 | HR 75 | Ht 70.0 in | Wt 201.4 lb

## 2022-12-29 DIAGNOSIS — R3912 Poor urinary stream: Secondary | ICD-10-CM

## 2022-12-29 DIAGNOSIS — N401 Enlarged prostate with lower urinary tract symptoms: Secondary | ICD-10-CM

## 2022-12-29 DIAGNOSIS — N476 Balanoposthitis: Secondary | ICD-10-CM

## 2022-12-29 DIAGNOSIS — R350 Frequency of micturition: Secondary | ICD-10-CM | POA: Diagnosis not present

## 2022-12-29 MED ORDER — NYSTATIN-TRIAMCINOLONE 100000-0.1 UNIT/GM-% EX CREA: 1.0000 | TOPICAL_CREAM | Freq: Two times a day (BID) | CUTANEOUS | 0 refills | Status: AC

## 2022-12-29 MED ORDER — TAMSULOSIN HCL 0.4 MG PO CAPS: 0.4000 mg | ORAL_CAPSULE | Freq: Every day | ORAL | 11 refills | Status: DC

## 2022-12-29 NOTE — Progress Notes (Signed)
I,Amy L Pierron,acting as a scribe for Vanna Scotland, MD.,have documented all relevant documentation on the behalf of Vanna Scotland, MD,as directed by  Vanna Scotland, MD while in the presence of Vanna Scotland, MD.  12/29/2022 4:50 PM   Benjamin Brewer 27-Sep-1930 [id number]  Referring provider: [name], MD [phone] [address] [facility] [place],  [place] [address]  Chief Complaint  Patient presents with   Wound Check    HPI: 89 year-old male presents today for dorsal slit wound check.  He said sometimes he wishes he would have gotten a circumcision because it can be a challenge to clean. But he is not overly bothered by it. He cleans it at least once a day.  He reports having urinary urgency, nocturia x3, and a somewhat weak stream. He feels he empties his bladder but it takes some time. He took Flomax a long time ago but only takes Finasteride right now.   Sometimes he gets dizzy when he stands up.   He reports having more frequent bowel movements which is probably due to having to bear down and strain to urinate.   PMH: Past Medical History:  Diagnosis Date   A-fib [facility])    a.) CHA2DS2VASc = 4 (age x 2, HTN, vascular disease history); b.) rate/rhythm maintained on oral amiodarone + diltiazem + metoprolol succinate; chronically anticoagulated with apixaban   Anemia    Angina pectoris (HCC)    Aortic atherosclerosis (HCC)    Aortic root dilatation (HCC) 05/09/2020   a.) TTE 05/09/2020: 38 mm; b.) TTE 02/04/2022: 42 mm   Arthritis of both hands    Benign prostatic hypertrophy    CAD (coronary artery disease)    a.) LHC 12/05/2002: 90% prox diagonal, 30% pLCx, 30% mLCx, 20% pOM1, 40% pRCA, 30% mRCA - med mgmt; b.) LHC/PCI 05/15/2008: 99% pRCA (2.75 x 15 mm Promus DES), 80% mLAD (3.5 x 18 mm Promus DES)   CKD (chronic kidney disease), stage III (HCC)    Dental root implant present    Diastolic dysfunction    a.) TTE 09/04/2014:  EF 55-60%, RVE, LAE, mild MR, G1DD; b.) TTE 01/15/2018: EF 60-65%, LAE, AoV sclerosis, triv MR/TR/PR, G1DD; c.) TTE 05/09/2020: EF 55-60%, LAD, mild-mod AoV sclerosis, G1DD; d.) TTE 04/25/2021: EF 60-65%, LVH, mild-mod MR, G1DD; e.) TTE 02/04/2022: EF 60-65%, mod LVH, LAE, mod RAE, AoV sclerosis, triv MR, G1DD   DOE (dyspnea on exertion)    Dyslipidemia    GERD (gastroesophageal reflux disease)    History of 2019 novel coronavirus disease (COVID-19) 11/2020   Hyperlipidemia    Hypertension    Long term current use of amiodarone    NSVT (nonsustained ventricular tachycardia) (HCC) 02/12/2021   a.) holter 02/12/2021: 17 beat run at a maximum rate of 154 bpm   Numbness and tingling of both lower extremities    Phimosis of penis    Pneumonia    Presence of permanent cardiac pacemaker 05/05/2021   a.) s/p dual-chamber PPM 05/05/2021; MDT Azure XT DR MRI SureScan (SN: [id number])   PSVT (paroxysmal supraventricular tachycardia) 01/12/2018   a.) holter 01/12/2018: PSVT runs --> longest lasting 14 beats at max rate of 142 bpm; b.) holter 02/12/2021: PSVT x 88 runs  --> longest lasting 5 beats at a maximum rat of 182 bpm   Tachy-brady syndrome (HCC)    a.) s/p PPM placement 05/05/2021   Vertigo     Surgical History: Past Surgical History:  Procedure Laterality  Date   CARDIAC CATHETERIZATION     [facility]   CATARACT EXTRACTION W/PHACO Left 07/03/2019   Procedure: CATARACT EXTRACTION PHACO AND INTRAOCULAR LENS PLACEMENT (IOC) LEFT 10.19  01:06.9;  Surgeon: Nevada Crane, MD;  Location: [facility]GERY [facility];  Service: Ophthalmology;  Laterality: Left;   COLONOSCOPY     CORONARY ANGIOPLASTY WITH STENT PLACEMENT Left 05/15/2008   Procedure: CORONARY ANGIOPLASTY WITH STENT PLACEMENT; Location: [facility]; Surgeon: Melene Muller, MD   DORSAL SLIT N/A 11/30/2022   Procedure: DORSAL SLIT;  Surgeon: Vanna Scotland, MD;  Location: [facility] ORS;  Service: Urology;  Laterality: N/A;    ESOPHAGOGASTRODUODENOSCOPY (EGD) WITH PROPOFOL N/A 06/10/2015   Procedure: ESOPHAGOGASTRODUODENOSCOPY (EGD) WITH PROPOFOL;  Surgeon: Scot Jun, MD;  Location: [facility] ENDOSCOPY;  Service: Endoscopy;  Laterality: N/A;   HERNIA REPAIR     LEFT HEART CATH AND CORONARY ANGIOGRAPHY Left 12/05/2002   Procedure: CORONARY ANGIOPLASTY WITH STENT PLACEMENT; Location: [facility]; Surgeon: Randa Evens, MD   PACEMAKER IMPLANT N/A 05/05/2021   Procedure: PACEMAKER IMPLANT;  Surgeon: [name], MD;  Location: [facility] INVASIVE CV LAB;  Service: Cardiovascular;  Laterality: N/A;   SAVORY DILATION N/A 06/10/2015   Procedure: SAVORY DILATION;  Surgeon: Scot Jun, MD;  Location: [facility] ENDOSCOPY;  Service: Endoscopy;  Laterality: N/A;   TONSILLECTOMY      Home Medications:  Allergies as of 12/29/2022       Reactions   Prednisone    "they think threw me into A-fib"   Terazosin Other (See Comments)   Muscle Pain        Medication List        Accurate as of December 29, 2022  4:50 PM. If you have any questions, ask your nurse or doctor.          amiodarone 200 MG tablet Commonly known as: PACERONE Take 1 tablet (200 mg total) by mouth daily.   atorvastatin 40 MG tablet Commonly known as: LIPITOR TAKE 1 TABLET BY MOUTH EVERY MORNING   [employer]S Fiber Gummies Chew Chew by mouth.   cyanocobalamin 500 MCG tablet Commonly known as: VITAMIN B12 Take 500 mcg by mouth 2 (two) times daily.   diltiazem 30 MG tablet Commonly known as: CARDIZEM TAKE ONE TABLET EVERY FOUR HOURS AS NEEDED FOR AFIB HEART RATE >100   Eliquis 5 MG Tabs tablet Generic drug: apixaban TAKE 1 TABLET BY MOUTH TWICE DAILY   ferrous sulfate 325 (65 FE) MG tablet Take 325 mg by mouth daily with breakfast.   finasteride 5 MG tablet Commonly known as: PROSCAR Take 5 mg by mouth daily.   ipratropium 0.03 % nasal spray Commonly known as: ATROVENT 1 spray as needed.   losartan 25 MG tablet Commonly known as:  COZAAR TAKE 1 TABLET BY MOUTH DAILY   LUBRICATING EYE DROPS OP Place 1 drop into both eyes daily as needed (dry eyes).   metoprolol succinate 25 MG 24 hr tablet Commonly known as: TOPROL-XL Take 25 mg by mouth 2 (two) times daily at 10 AM and 5 PM.   multivitamin tablet Take 1 tablet by mouth daily.   nitroGLYCERIN 0.4 MG SL tablet Commonly known as: NITROSTAT Place 1 nitroglycerin under your tongue, while sitting. If no relief of pain, may repeat 1 tablet every 5 minutes up to 3 tablets over 15 minutes.   nystatin-triamcinolone cream Commonly known as: MYCOLOG II Apply 1 Application topically 2 (two) times daily for 7 days. Started by: Vanna Scotland, MD  pantoprazole 40 MG tablet Commonly known as: PROTONIX TAKE 1 TABLET BY MOUTH NIGHTLY   PreserVision AREDS 2 Caps Take 1 capsule by mouth 2 (two) times daily.   tamsulosin 0.4 MG Caps capsule Commonly known as: FLOMAX Take 1 capsule (0.4 mg total) by mouth daily. Started by: Vanna Scotland, MD   VITAMIN D3 PO Take 1 capsule by mouth daily.        Allergies:  Allergies  Allergen Reactions   Prednisone     "they think threw me into A-fib"   Terazosin Other (See Comments)    Muscle Pain    Family History: Family History  Problem Relation Age of Onset   Stroke Mother    Heart attack Mother    Hypertension Mother    Stroke Other        family history    Social History:  reports that he has never smoked. He has never used smokeless tobacco. He reports current alcohol use of about 1.0 standard drink of alcohol per week. He reports that he does not use drugs.   Physical Exam: BP 114/63   Pulse 75   Ht 5\' 10"  (1.778 m)   Wt 201 lb 6 oz (91.3 kg)   BMI 28.89 kg/m   Constitutional:  Alert and oriented, No acute distress. HEENT: [place] AT, moist mucus membranes.  Trachea midline, no masses. GU: Well healed dorsal slit; able to easily retract. Some erythema diffusely on the glans consistent with  balanitis. Neurologic: Grossly intact, no focal deficits, moving all 4 extremities. Psychiatric: Normal mood and affect.   Assessment & Plan:    Balanoposthitis  - Mild residual.   - Plan to treat with Mycolog cream twice daily until completely clears  2. BPH with urinary frequency and weak stream  - Will add Flomax to his regimen and continue Fidasteride. Could consider outlet procedure if he fails to improve. Will reassess with IPSS and PVR. Discussed concern for orthostasis combined with advice to stop if he does have dizziness with standing.  Return in about 3 months (around 03/31/2023) for IPSS, PVR, wound check.  I have reviewed the above documentation for accuracy and completeness, and I agree with the above.   Vanna Scotland, MD   [facility] [address] [place], [place] [address] [phone]

## 2023-01-21 DIAGNOSIS — I10 Essential (primary) hypertension: Secondary | ICD-10-CM | POA: Diagnosis not present

## 2023-01-21 DIAGNOSIS — Z950 Presence of cardiac pacemaker: Secondary | ICD-10-CM | POA: Diagnosis not present

## 2023-01-21 DIAGNOSIS — E663 Overweight: Secondary | ICD-10-CM | POA: Diagnosis not present

## 2023-01-21 DIAGNOSIS — I2510 Atherosclerotic heart disease of native coronary artery without angina pectoris: Secondary | ICD-10-CM | POA: Diagnosis not present

## 2023-01-21 DIAGNOSIS — N400 Enlarged prostate without lower urinary tract symptoms: Secondary | ICD-10-CM | POA: Diagnosis not present

## 2023-01-21 DIAGNOSIS — Z7901 Long term (current) use of anticoagulants: Secondary | ICD-10-CM | POA: Diagnosis not present

## 2023-01-21 DIAGNOSIS — E785 Hyperlipidemia, unspecified: Secondary | ICD-10-CM | POA: Diagnosis not present

## 2023-01-21 DIAGNOSIS — I4891 Unspecified atrial fibrillation: Secondary | ICD-10-CM | POA: Diagnosis not present

## 2023-01-21 DIAGNOSIS — D6869 Other thrombophilia: Secondary | ICD-10-CM | POA: Diagnosis not present

## 2023-01-21 DIAGNOSIS — H353211 Exudative age-related macular degeneration, right eye, with active choroidal neovascularization: Secondary | ICD-10-CM | POA: Diagnosis not present

## 2023-01-21 DIAGNOSIS — I495 Sick sinus syndrome: Secondary | ICD-10-CM | POA: Diagnosis not present

## 2023-01-21 DIAGNOSIS — K219 Gastro-esophageal reflux disease without esophagitis: Secondary | ICD-10-CM | POA: Diagnosis not present

## 2023-02-03 ENCOUNTER — Ambulatory Visit (INDEPENDENT_AMBULATORY_CARE_PROVIDER_SITE_OTHER): Payer: PPO

## 2023-02-03 DIAGNOSIS — I495 Sick sinus syndrome: Secondary | ICD-10-CM

## 2023-02-04 LAB — CUP PACEART REMOTE DEVICE CHECK
Battery Remaining Longevity: 148 mo
Battery Voltage: 3.02 V
Brady Statistic AP VP Percent: 0.08 %
Brady Statistic AP VS Percent: 93.61 %
Brady Statistic AS VP Percent: 0.01 %
Brady Statistic AS VS Percent: 6.31 %
Brady Statistic RV Percent Paced: 0.09 %
Date Time Interrogation Session: 20240724053433
Implantable Lead Connection Status: 753985
Implantable Lead Connection Status: 753985
Implantable Lead Implant Date: 20221024
Implantable Lead Implant Date: 20221024
Implantable Lead Location: 753859
Implantable Lead Model: 5076
Implantable Lead Model: 5076
Implantable Pulse Generator Implant Date: 20221024
Lead Channel Impedance Value: 304 Ohm
Lead Channel Impedance Value: 380 Ohm
Lead Channel Impedance Value: 456 Ohm
Lead Channel Pacing Threshold Amplitude: 0.5 V
Lead Channel Pacing Threshold Amplitude: 0.75 V
Lead Channel Pacing Threshold Pulse Width: 0.4 ms
Lead Channel Pacing Threshold Pulse Width: 0.4 ms
Lead Channel Sensing Intrinsic Amplitude: 0.875 mV
Lead Channel Sensing Intrinsic Amplitude: 0.875 mV
Lead Channel Sensing Intrinsic Amplitude: 6 mV
Lead Channel Sensing Intrinsic Amplitude: 6 mV
Lead Channel Setting Pacing Amplitude: 1.5 V
Lead Channel Setting Pacing Amplitude: 2 V
Lead Channel Setting Pacing Pulse Width: 0.4 ms
Zone Setting Status: 755011
Zone Setting Status: 755011

## 2023-02-16 NOTE — Progress Notes (Signed)
Remote pacemaker transmission.   

## 2023-02-27 ENCOUNTER — Other Ambulatory Visit: Payer: Self-pay | Admitting: Cardiovascular Disease

## 2023-03-15 ENCOUNTER — Other Ambulatory Visit: Payer: Self-pay | Admitting: Cardiovascular Disease

## 2023-03-20 ENCOUNTER — Other Ambulatory Visit: Payer: Self-pay | Admitting: Cardiovascular Disease

## 2023-03-23 DIAGNOSIS — H3540 Unspecified peripheral retinal degeneration: Secondary | ICD-10-CM | POA: Diagnosis not present

## 2023-03-23 DIAGNOSIS — H353131 Nonexudative age-related macular degeneration, bilateral, early dry stage: Secondary | ICD-10-CM | POA: Diagnosis not present

## 2023-03-24 ENCOUNTER — Other Ambulatory Visit: Payer: PPO

## 2023-03-24 DIAGNOSIS — Z7982 Long term (current) use of aspirin: Secondary | ICD-10-CM | POA: Diagnosis not present

## 2023-03-24 DIAGNOSIS — Z79899 Other long term (current) drug therapy: Secondary | ICD-10-CM | POA: Diagnosis not present

## 2023-03-24 DIAGNOSIS — M5442 Lumbago with sciatica, left side: Secondary | ICD-10-CM | POA: Diagnosis not present

## 2023-03-24 DIAGNOSIS — Z7901 Long term (current) use of anticoagulants: Secondary | ICD-10-CM | POA: Diagnosis not present

## 2023-03-24 DIAGNOSIS — I10 Essential (primary) hypertension: Secondary | ICD-10-CM | POA: Diagnosis not present

## 2023-03-24 DIAGNOSIS — I2510 Atherosclerotic heart disease of native coronary artery without angina pectoris: Secondary | ICD-10-CM | POA: Diagnosis not present

## 2023-03-31 ENCOUNTER — Ambulatory Visit: Payer: PPO | Admitting: Urology

## 2023-04-06 ENCOUNTER — Other Ambulatory Visit: Payer: Self-pay | Admitting: Physical Medicine & Rehabilitation

## 2023-04-06 ENCOUNTER — Ambulatory Visit
Admission: RE | Admit: 2023-04-06 | Discharge: 2023-04-06 | Disposition: A | Discharge status: Home / Self Care | Payer: PPO | Source: Ambulatory Visit | Attending: Physical Medicine & Rehabilitation | Admitting: Physical Medicine & Rehabilitation

## 2023-04-06 DIAGNOSIS — M5416 Radiculopathy, lumbar region: Secondary | ICD-10-CM

## 2023-04-06 DIAGNOSIS — M4855XA Collapsed vertebra, not elsewhere classified, thoracolumbar region, initial encounter for fracture: Secondary | ICD-10-CM | POA: Diagnosis not present

## 2023-04-06 DIAGNOSIS — M48061 Spinal stenosis, lumbar region without neurogenic claudication: Secondary | ICD-10-CM | POA: Diagnosis not present

## 2023-04-06 DIAGNOSIS — M5442 Lumbago with sciatica, left side: Secondary | ICD-10-CM | POA: Diagnosis not present

## 2023-04-06 DIAGNOSIS — M4856XA Collapsed vertebra, not elsewhere classified, lumbar region, initial encounter for fracture: Secondary | ICD-10-CM | POA: Diagnosis not present

## 2023-04-06 DIAGNOSIS — M4857XA Collapsed vertebra, not elsewhere classified, lumbosacral region, initial encounter for fracture: Secondary | ICD-10-CM | POA: Diagnosis not present

## 2023-04-06 NOTE — Progress Notes (Unsigned)
04/07/2023 10:45 AM   Benjamin Brewer 03-04-1931 [id number]  Referring provider: [name], MD [phone] [address] [facility] [place],  [place] [address]  Urological history: 1. BPH -PSA (05/2020) 0.86 -finasteride 5 mg daily   Chief Complaint  Patient presents with   Follow-up    Phimosis   HPI: Benjamin Brewer is a 89 y.o. male who presents today for 3 month follow up.  Previous records reviewed.   Underwent penile dorsal slit on 11/2022 with Dr. Apolinar Junes.   It is well healed.  He wants something to make it work now.  He has ED.  He has not tried PDE5i's, but he is on nitrates.   I PSS 14/3  PVR 0 mL  He states he is urinating too much during the day and too much at night.  He is unsure if he is taking the tamsulosin and finasteride.  Patient denies any modifying or aggravating factors.  Patient denies any recent UTI's, gross hematuria, dysuria or suprapubic/flank pain.  Patient denies any fevers, chills, nausea or vomiting.     IPSS     Row Name 04/07/23 1000         International Prostate Symptom Score   How often have you had the sensation of not emptying your bladder? About half the time     How often have you had to urinate less than every two hours? Less than half the time     How often have you found you stopped and started again several times when you urinated? Less than half the time     How often have you found it difficult to postpone urination? Less than half the time     How often have you had a weak urinary stream? Less than half the time     How often have you had to strain to start urination? Not at All     How many times did you typically get up at night to urinate? 3 Times     Total IPSS Score 14       Quality of Life due to urinary symptoms   If you were to spend the rest of your life with your urinary condition just the way it is now how would you feel about that? Mixed              Score:  1-7  Mild 8-19 Moderate 20-35 Severe    PMH: Past Medical History:  Diagnosis Date   A-fib (HCC)    a.) CHA2DS2VASc = 4 (age x 2, HTN, vascular disease history); b.) rate/rhythm maintained on oral amiodarone + diltiazem + metoprolol succinate; chronically anticoagulated with apixaban   Anemia    Angina pectoris (HCC)    Aortic atherosclerosis (HCC)    Aortic root dilatation (HCC) 05/09/2020   a.) TTE 05/09/2020: 38 mm; b.) TTE 02/04/2022: 42 mm   Arthritis of both hands    Benign prostatic hypertrophy    CAD (coronary artery disease)    a.) LHC 12/05/2002: 90% prox diagonal, 30% pLCx, 30% mLCx, 20% pOM1, 40% pRCA, 30% mRCA - med mgmt; b.) LHC/PCI 05/15/2008: 99% pRCA (2.75 x 15 mm Promus DES), 80% mLAD (3.5 x 18 mm Promus DES)   CKD (chronic kidney disease), stage III (HCC)    Dental root implant present    Diastolic dysfunction    a.) TTE 09/04/2014: EF 55-60%, RVE, LAE, mild MR, G1DD; b.) TTE 01/15/2018: EF 60-65%,  LAE, AoV sclerosis, triv MR/TR/PR, G1DD; c.) TTE 05/09/2020: EF 55-60%, LAD, mild-mod AoV sclerosis, G1DD; d.) TTE 04/25/2021: EF 60-65%, LVH, mild-mod MR, G1DD; e.) TTE 02/04/2022: EF 60-65%, mod LVH, LAE, mod RAE, AoV sclerosis, triv MR, G1DD   DOE (dyspnea on exertion)    Dyslipidemia    GERD (gastroesophageal reflux disease)    History of 2019 novel coronavirus disease (COVID-19) 11/2020   Hyperlipidemia    Hypertension    Long term current use of amiodarone    NSVT (nonsustained ventricular tachycardia) (HCC) 02/12/2021   a.) holter 02/12/2021: 17 beat run at a maximum rate of 154 bpm   Numbness and tingling of both lower extremities    Phimosis of penis    Pneumonia    Presence of permanent cardiac pacemaker 05/05/2021   a.) s/p dual-chamber PPM 05/05/2021; MDT Azure XT DR MRI SureScan (SN: [id number])   PSVT (paroxysmal supraventricular tachycardia) 01/12/2018   a.) holter 01/12/2018: PSVT runs --> longest lasting 14 beats at max rate of 142 bpm; b.) holter  02/12/2021: PSVT x 88 runs  --> longest lasting 5 beats at a maximum rat of 182 bpm   Tachy-brady syndrome (HCC)    a.) s/p PPM placement 05/05/2021   Vertigo     Surgical History: Past Surgical History:  Procedure Laterality Date   CARDIAC CATHETERIZATION     [facility]   CATARACT EXTRACTION W/PHACO Left 07/03/2019   Procedure: CATARACT EXTRACTION PHACO AND INTRAOCULAR LENS PLACEMENT (IOC) LEFT 10.19  01:06.9;  Surgeon: Nevada Crane, MD;  Location: [facility];  Service: Ophthalmology;  Laterality: Left;   COLONOSCOPY     CORONARY ANGIOPLASTY WITH STENT PLACEMENT Left 05/15/2008   Procedure: CORONARY ANGIOPLASTY WITH STENT PLACEMENT; Location: [facility]; Surgeon: Melene Muller, MD   DORSAL SLIT N/A 11/30/2022   Procedure: DORSAL SLIT;  Surgeon: Vanna Scotland, MD;  Location: [facility] ORS;  Service: Urology;  Laterality: N/A;   ESOPHAGOGASTRODUODENOSCOPY (EGD) WITH PROPOFOL N/A 06/10/2015   Procedure: ESOPHAGOGASTRODUODENOSCOPY (EGD) WITH PROPOFOL;  Surgeon: Scot Jun, MD;  Location: [facility] ENDOSCOPY;  Service: Endoscopy;  Laterality: N/A;   HERNIA REPAIR     LEFT HEART CATH AND CORONARY ANGIOGRAPHY Left 12/05/2002   Procedure: CORONARY ANGIOPLASTY WITH STENT PLACEMENT; Location: [facility]; Surgeon: Randa Evens, MD   PACEMAKER IMPLANT N/A 05/05/2021   Procedure: PACEMAKER IMPLANT;  Surgeon: [name], MD;  Location: [facility] INVASIVE CV LAB;  Service: Cardiovascular;  Laterality: N/A;   SAVORY DILATION N/A 06/10/2015   Procedure: SAVORY DILATION;  Surgeon: Scot Jun, MD;  Location: [facility] ENDOSCOPY;  Service: Endoscopy;  Laterality: N/A;   TONSILLECTOMY      Home Medications:  Allergies as of 04/07/2023       Reactions   Prednisone    "they think threw me into A-fib"   Terazosin Other (See Comments)   Muscle Pain        Medication List        Accurate as of April 07, 2023 10:45 AM. If you have any questions, ask your nurse or  doctor.          amiodarone 200 MG tablet Commonly known as: PACERONE TAKE ONE TABLET BY MOUTH DAILY   atorvastatin 40 MG tablet Commonly known as: LIPITOR TAKE 1 TABLET BY MOUTH EVERY MORNING   [employer] Fiber Gummies Chew Chew by mouth.   cyanocobalamin 500 MCG tablet Commonly known as: VITAMIN B12 Take 500 mcg by mouth 2 (two) times daily.   diltiazem 30  MG tablet Commonly known as: CARDIZEM TAKE ONE TABLET EVERY FOUR HOURS AS NEEDED FOR AFIB HEART RATE >100   Eliquis 5 MG Tabs tablet Generic drug: apixaban TAKE 1 TABLET BY MOUTH TWICE DAILY   ferrous sulfate 325 (65 FE) MG tablet Take 325 mg by mouth daily with breakfast.   finasteride 5 MG tablet Commonly known as: PROSCAR Take 5 mg by mouth daily.   ipratropium 0.03 % nasal spray Commonly known as: ATROVENT 1 spray as needed.   losartan 25 MG tablet Commonly known as: COZAAR TAKE 1 TABLET BY MOUTH DAILY   LUBRICATING EYE DROPS OP Place 1 drop into both eyes daily as needed (dry eyes).   methocarbamol 500 MG tablet Commonly known as: ROBAXIN Take 500 mg by mouth 2 (two) times daily.   metoprolol succinate 25 MG 24 hr tablet Commonly known as: TOPROL-XL Take 25 mg by mouth 2 (two) times daily at 10 AM and 5 PM.   multivitamin tablet Take 1 tablet by mouth daily.   nitroGLYCERIN 0.4 MG SL tablet Commonly known as: NITROSTAT Place 1 nitroglycerin under your tongue, while sitting. If no relief of pain, may repeat 1 tablet every 5 minutes up to 3 tablets over 15 minutes.   pantoprazole 40 MG tablet Commonly known as: PROTONIX TAKE 1 TABLET BY MOUTH NIGHTLY   PreserVision AREDS 2 Caps Take 1 capsule by mouth 2 (two) times daily.   tamsulosin 0.4 MG Caps capsule Commonly known as: FLOMAX Take 1 capsule (0.4 mg total) by mouth daily.   VITAMIN D3 PO Take 1 capsule by mouth daily.        Allergies:  Allergies  Allergen Reactions   Prednisone     "they think threw me into A-fib"   Terazosin  Other (See Comments)    Muscle Pain    Family History: Family History  Problem Relation Age of Onset   Stroke Mother    Heart attack Mother    Hypertension Mother    Stroke Other        family history    Social History:  reports that he has never smoked. He has never used smokeless tobacco. He reports current alcohol use of about 1.0 standard drink of alcohol per week. He reports that he does not use drugs.  ROS: Pertinent ROS in HPI  Physical Exam: BP (!) 147/75   Pulse 79   Ht 5\' 10"  (1.778 m)   Wt 205 lb (93 kg)   BMI 29.41 kg/m   Constitutional:  Well nourished. Alert and oriented, No acute distress. HEENT: [place] AT, moist mucus membranes.  Trachea midline Cardiovascular: No clubbing, cyanosis, or edema. Respiratory: Normal respiratory effort, no increased work of breathing. GU: No CVA tenderness.  No bladder fullness or masses.  Patient with uncircumcised phallus. Foreskin easily retracted.  Dorsal slit well healed.    Urethral meatus is patent.  No penile discharge. No penile lesions or rashes.  Neurologic: Grossly intact, no focal deficits, moving all 4 extremities. Psychiatric: Normal mood and affect.   Laboratory Data: N/A  Pertinent Imaging:  04/07/23 10:27  Scan Result 0 ml     Assessment & Plan:    1. Phimosis -s/p dorsal slit  2. BPH with LUTS -most bothersome symptoms are frequency and nocturia -continue conservative management, avoiding bladder irritants and timed voiding's -He is unsure if he is taking the tamsulosin or Proscar, I have written the medication down for him and when he gets home he will check -If  he has not been taking the tamsulosin and the Proscar, I will send a prescription in and schedule him for a 23-month follow-up for IPSS and PVR -If he has been taking the medications, he is interested in a bladder outlet procedure and will need to be scheduled for cystoscopy/TRUS  3. ED -He sees his cardiologist, Dr. Kirke Corin, next week and will  ask him if it is appropriate for him to have a trial of Viagra   Return for Patient to call back with required information .  These notes generated with voice recognition software. I apologize for typographical errors.  Cloretta Ned  [facility] [address]  [address] [place], [place] [address] [phone]

## 2023-04-07 ENCOUNTER — Ambulatory Visit: Payer: PPO | Admitting: Urology

## 2023-04-07 ENCOUNTER — Telehealth: Payer: Self-pay | Admitting: Urology

## 2023-04-07 ENCOUNTER — Encounter: Payer: Self-pay | Admitting: Urology

## 2023-04-07 VITALS — BP 147/75 | HR 79 | Ht 70.0 in | Wt 205.0 lb

## 2023-04-07 DIAGNOSIS — N476 Balanoposthitis: Secondary | ICD-10-CM

## 2023-04-07 DIAGNOSIS — Z87438 Personal history of other diseases of male genital organs: Secondary | ICD-10-CM

## 2023-04-07 DIAGNOSIS — N529 Male erectile dysfunction, unspecified: Secondary | ICD-10-CM

## 2023-04-07 DIAGNOSIS — R351 Nocturia: Secondary | ICD-10-CM

## 2023-04-07 DIAGNOSIS — R350 Frequency of micturition: Secondary | ICD-10-CM | POA: Diagnosis not present

## 2023-04-07 DIAGNOSIS — N401 Enlarged prostate with lower urinary tract symptoms: Secondary | ICD-10-CM

## 2023-04-07 LAB — BLADDER SCAN AMB NON-IMAGING: Scan Result: 0

## 2023-04-07 NOTE — Telephone Encounter (Signed)
Patient called to let Carollee Herter know that he has been taking Finasteride, but not Flomax.

## 2023-04-07 NOTE — Patient Instructions (Signed)
Check at home to see if you are taking tamsulosin 0.4 mg daily (Flomax) and finasteride 5 mg daily (Proscar)  Check with Dr. Kirke Corin at your visit next week to see if it is safe for you to take Viagra

## 2023-04-09 NOTE — Telephone Encounter (Signed)
Spoke with patient and advised results Pt was having a hard time hearing me and will call back.

## 2023-04-12 ENCOUNTER — Other Ambulatory Visit: Payer: Self-pay | Admitting: Cardiovascular Disease

## 2023-04-12 DIAGNOSIS — M5416 Radiculopathy, lumbar region: Secondary | ICD-10-CM | POA: Diagnosis not present

## 2023-04-15 ENCOUNTER — Ambulatory Visit: Payer: PPO | Admitting: Cardiovascular Disease

## 2023-04-15 DIAGNOSIS — I6523 Occlusion and stenosis of bilateral carotid arteries: Secondary | ICD-10-CM | POA: Diagnosis not present

## 2023-04-15 DIAGNOSIS — I4891 Unspecified atrial fibrillation: Secondary | ICD-10-CM | POA: Diagnosis not present

## 2023-04-15 DIAGNOSIS — R42 Dizziness and giddiness: Secondary | ICD-10-CM | POA: Diagnosis not present

## 2023-04-15 DIAGNOSIS — Z7901 Long term (current) use of anticoagulants: Secondary | ICD-10-CM | POA: Diagnosis not present

## 2023-04-15 DIAGNOSIS — I2510 Atherosclerotic heart disease of native coronary artery without angina pectoris: Secondary | ICD-10-CM | POA: Diagnosis not present

## 2023-04-15 DIAGNOSIS — I639 Cerebral infarction, unspecified: Secondary | ICD-10-CM | POA: Diagnosis not present

## 2023-04-15 DIAGNOSIS — I951 Orthostatic hypotension: Secondary | ICD-10-CM | POA: Diagnosis not present

## 2023-04-15 DIAGNOSIS — Z79899 Other long term (current) drug therapy: Secondary | ICD-10-CM | POA: Diagnosis not present

## 2023-04-15 DIAGNOSIS — I10 Essential (primary) hypertension: Secondary | ICD-10-CM | POA: Diagnosis not present

## 2023-04-28 DIAGNOSIS — M5416 Radiculopathy, lumbar region: Secondary | ICD-10-CM | POA: Diagnosis not present

## 2023-04-28 DIAGNOSIS — M5442 Lumbago with sciatica, left side: Secondary | ICD-10-CM | POA: Diagnosis not present

## 2023-04-30 DIAGNOSIS — Z23 Encounter for immunization: Secondary | ICD-10-CM | POA: Diagnosis not present

## 2023-04-30 DIAGNOSIS — Z79899 Other long term (current) drug therapy: Secondary | ICD-10-CM | POA: Diagnosis not present

## 2023-04-30 DIAGNOSIS — E7800 Pure hypercholesterolemia, unspecified: Secondary | ICD-10-CM | POA: Diagnosis not present

## 2023-04-30 DIAGNOSIS — I480 Paroxysmal atrial fibrillation: Secondary | ICD-10-CM | POA: Diagnosis not present

## 2023-04-30 DIAGNOSIS — I2510 Atherosclerotic heart disease of native coronary artery without angina pectoris: Secondary | ICD-10-CM | POA: Diagnosis not present

## 2023-05-05 ENCOUNTER — Ambulatory Visit (INDEPENDENT_AMBULATORY_CARE_PROVIDER_SITE_OTHER): Payer: PPO

## 2023-05-05 DIAGNOSIS — I495 Sick sinus syndrome: Secondary | ICD-10-CM

## 2023-05-05 LAB — CUP PACEART REMOTE DEVICE CHECK
Battery Remaining Longevity: 146 mo
Battery Voltage: 3.02 V
Brady Statistic AP VP Percent: 0.09 %
Brady Statistic AP VS Percent: 93.11 %
Brady Statistic AS VP Percent: 0.01 %
Brady Statistic AS VS Percent: 6.79 %
Brady Statistic RA Percent Paced: 93.21 %
Brady Statistic RV Percent Paced: 0.1 %
Date Time Interrogation Session: 20241022223108
Implantable Lead Connection Status: 753985
Implantable Lead Connection Status: 753985
Implantable Lead Implant Date: 20221024
Implantable Lead Implant Date: 20221024
Implantable Lead Location: 753859
Implantable Lead Location: 753860
Implantable Lead Model: 5076
Implantable Lead Model: 5076
Implantable Pulse Generator Implant Date: 20221024
Lead Channel Impedance Value: 323 Ohm
Lead Channel Impedance Value: 342 Ohm
Lead Channel Impedance Value: 399 Ohm
Lead Channel Impedance Value: 494 Ohm
Lead Channel Pacing Threshold Amplitude: 0.625 V
Lead Channel Pacing Threshold Amplitude: 0.625 V
Lead Channel Pacing Threshold Pulse Width: 0.4 ms
Lead Channel Pacing Threshold Pulse Width: 0.4 ms
Lead Channel Sensing Intrinsic Amplitude: 0.625 mV
Lead Channel Sensing Intrinsic Amplitude: 0.625 mV
Lead Channel Sensing Intrinsic Amplitude: 6.25 mV
Lead Channel Sensing Intrinsic Amplitude: 6.25 mV
Lead Channel Setting Pacing Amplitude: 1.5 V
Lead Channel Setting Pacing Amplitude: 2 V
Lead Channel Setting Pacing Pulse Width: 0.4 ms
Lead Channel Setting Sensing Sensitivity: 0.9 mV
Zone Setting Status: 755011
Zone Setting Status: 755011

## 2023-05-10 DIAGNOSIS — M5416 Radiculopathy, lumbar region: Secondary | ICD-10-CM | POA: Diagnosis not present

## 2023-05-18 DIAGNOSIS — M5416 Radiculopathy, lumbar region: Secondary | ICD-10-CM | POA: Diagnosis not present

## 2023-05-18 DIAGNOSIS — M5442 Lumbago with sciatica, left side: Secondary | ICD-10-CM | POA: Diagnosis not present

## 2023-05-25 NOTE — Progress Notes (Signed)
Remote pacemaker transmission.   

## 2023-06-02 ENCOUNTER — Other Ambulatory Visit: Payer: Self-pay | Admitting: Cardiovascular Disease

## 2023-06-03 ENCOUNTER — Other Ambulatory Visit: Payer: Self-pay | Admitting: Cardiovascular Disease

## 2023-06-08 ENCOUNTER — Encounter: Payer: Self-pay | Admitting: Cardiovascular Disease

## 2023-06-08 ENCOUNTER — Ambulatory Visit: Payer: PPO | Attending: Cardiovascular Disease | Admitting: Cardiovascular Disease

## 2023-06-08 VITALS — BP 138/70 | HR 66 | Ht 70.0 in | Wt 210.5 lb

## 2023-06-08 DIAGNOSIS — I2510 Atherosclerotic heart disease of native coronary artery without angina pectoris: Secondary | ICD-10-CM | POA: Diagnosis not present

## 2023-06-08 DIAGNOSIS — I10 Essential (primary) hypertension: Secondary | ICD-10-CM

## 2023-06-08 DIAGNOSIS — I4891 Unspecified atrial fibrillation: Secondary | ICD-10-CM | POA: Diagnosis not present

## 2023-06-08 DIAGNOSIS — I495 Sick sinus syndrome: Secondary | ICD-10-CM | POA: Diagnosis not present

## 2023-06-08 DIAGNOSIS — E785 Hyperlipidemia, unspecified: Secondary | ICD-10-CM | POA: Diagnosis not present

## 2023-06-08 MED ORDER — NITROGLYCERIN 0.4 MG SL SUBL: SUBLINGUAL_TABLET | SUBLINGUAL | 3 refills | Status: AC

## 2023-06-08 MED ORDER — AMIODARONE HCL 100 MG PO TABS: 100.0000 mg | ORAL_TABLET | Freq: Every day | ORAL | 3 refills | Status: DC

## 2023-06-08 NOTE — Patient Instructions (Signed)
Medication Instructions:  Your physician recommends the following medication changes.  DECREASE: Amiodarone to 100 mg by mouth daily   *If you need a refill on your cardiac medications before your next appointment, please call your pharmacy*   Lab Work: No labs ordered today    Testing/Procedures: No test ordered today    Follow-Up: At [facility], you and your health needs are our priority.  As part of our continuing mission to provide you with exceptional heart care, we have created designated Provider Care Teams.  These Care Teams include your primary Cardiologist (physician) and Advanced Practice Providers (APPs -  Physician Assistants and Nurse Practitioners) who all work together to provide you with the care you need, when you need it.  We recommend signing up for the patient portal called "MyChart".  Sign up information is provided on this After Visit Summary.  MyChart is used to connect with patients for Virtual Visits (Telemedicine).  Patients are able to view lab/test results, encounter notes, upcoming appointments, etc.  Non-urgent messages can be sent to your provider as well.   To learn more about what you can do with MyChart, go to [email or web address].    Your next appointment:   6 month(s)  Provider:   You may see [name], MD or one of the following Advanced Practice Providers on your designated Care Team:   Nicolasa Ducking, NP Eula Listen, PA-C Cadence Fransico Michael, PA-C Charlsie Quest, NP Carlos Levering, NP

## 2023-06-08 NOTE — Progress Notes (Unsigned)
Cardiology Office Note   Date:  06/08/2023   ID:  Benjamin Brewer, DOB [birth year], MRN [id number]  PCP:  [name], MD  Cardiologist:   [name], MD   Chief Complaint  Patient presents with   Follow-up    3 month f/u no complaints today. Meds reviewed verbally with pt.      History of Present Illness: Benjamin Brewer is a 89 y.o. male who presents for a followup visit regarding coronary artery disease and paroxysmal atrial fibrillation . He is status post angioplasty and drug-eluting stent placement to the LAD and RCA in Jul 01, 2008.  He was diagnosed with atrial fibrillation with rapid ventricular response in 2015 and has been on anticoagulation since then.  Echocardiogram in July 2019 showed EF of 60 to 65%, calcified aortic valve without significant stenosis, mildly dilated left atrium and no significant pulmonary hypertension. The patient's wife died in 2018/07/01 and he had significant grief, anxiety and depression since then. He was diagnosed with cervical spine disease in Jul 02, 2019.  Treatment with prednisone was associated with worsening atrial fibrillation.  He was diagnosed with COVID-19 infection in May of 2022 and had severe intermittent diarrhea since then.  In that setting, he had worsening palpitations with episodes of dizziness with a sense of pause in his heart.  A 14-day ZIO monitor was done which showed sinus rhythm with an average heart rate of 70 bpm.  There was 1 run of ventricular tachycardia that lasted 17 beats and he had 88 runs of supraventricular tachycardia the longest lasted 15 seconds.  He had one 4.1-second pause.  He underwent a permanent pacemaker placement in October 2022 for tachybradycardia syndrome.  He went to the emergency room in April with dizziness and tachycardia and was found to be in atrial fibrillation with rapid ventricular response.  His heart rate was 140 bpm.  He converted to sinus rhythm with IV amiodarone and was discharged home on oral  amiodarone.   He had increased photosensitivity with amiodarone and decreased vision. He had an emergency room visit in October with dizziness but was noted to be in sinus rhythm at that time.  He has been doing reasonably well overall with no chest pain, worsening dyspnea or palpitations.   Past Medical History:  Diagnosis Date   A-fib [facility])    a.) CHA2DS2VASc = 4 (age x 2, HTN, vascular disease history); b.) rate/rhythm maintained on oral amiodarone + diltiazem + metoprolol succinate; chronically anticoagulated with apixaban   Anemia    Angina pectoris (HCC)    Aortic atherosclerosis (HCC)    Aortic root dilatation (HCC) 05/09/2020   a.) TTE 05/09/2020: 38 mm; b.) TTE 02/04/2022: 42 mm   Arthritis of both hands    Benign prostatic hypertrophy    CAD (coronary artery disease)    a.) LHC 12/05/2002: 90% prox diagonal, 30% pLCx, 30% mLCx, 20% pOM1, 40% pRCA, 30% mRCA - med mgmt; b.) LHC/PCI 05/15/2008: 99% pRCA (2.75 x 15 mm Promus DES), 80% mLAD (3.5 x 18 mm Promus DES)   CKD (chronic kidney disease), stage III (HCC)    Dental root implant present    Diastolic dysfunction    a.) TTE 09/04/2014: EF 55-60%, RVE, LAE, mild MR, G1DD; b.) TTE 01/15/2018: EF 60-65%, LAE, AoV sclerosis, triv MR/TR/PR, G1DD; c.) TTE 05/09/2020: EF 55-60%, LAD, mild-mod AoV sclerosis, G1DD; d.) TTE 04/25/2021: EF 60-65%, LVH, mild-mod MR, G1DD; e.) TTE 02/04/2022: EF 60-65%, mod LVH, LAE, mod RAE, AoV sclerosis, triv MR,  G1DD   DOE (dyspnea on exertion)    Dyslipidemia    GERD (gastroesophageal reflux disease)    History of 2019 novel coronavirus disease (COVID-19) 11/2020   Hyperlipidemia    Hypertension    Long term current use of amiodarone    NSVT (nonsustained ventricular tachycardia) (HCC) 02/12/2021   a.) holter 02/12/2021: 17 beat run at a maximum rate of 154 bpm   Numbness and tingling of both lower extremities    Phimosis of penis    Pneumonia    Presence of permanent cardiac pacemaker 05/05/2021    a.) s/p dual-chamber PPM 05/05/2021; MDT Azure XT DR MRI SureScan (SN: [id number])   PSVT (paroxysmal supraventricular tachycardia) (HCC) 01/12/2018   a.) holter 01/12/2018: PSVT runs --> longest lasting 14 beats at max rate of 142 bpm; b.) holter 02/12/2021: PSVT x 88 runs  --> longest lasting 5 beats at a maximum rat of 182 bpm   Tachy-brady syndrome (HCC)    a.) s/p PPM placement 05/05/2021   Vertigo     Past Surgical History:  Procedure Laterality Date   CARDIAC CATHETERIZATION     [facility]   CATARACT EXTRACTION [facility] Left 07/03/2019   Procedure: CATARACT EXTRACTION PHACO AND INTRAOCULAR LENS PLACEMENT (IOC) LEFT 10.19  01:06.9;  Surgeon: Nevada Crane, MD;  Location: [facility];  Service: Ophthalmology;  Laterality: Left;   COLONOSCOPY     CORONARY ANGIOPLASTY WITH STENT PLACEMENT Left 05/15/2008   Procedure: CORONARY ANGIOPLASTY WITH STENT PLACEMENT; Location: [facility]; Surgeon: Melene Muller, MD   DORSAL SLIT N/A 11/30/2022   Procedure: DORSAL SLIT;  Surgeon: Vanna Scotland, MD;  Location: [facility] ORS;  Service: Urology;  Laterality: N/A;   ESOPHAGOGASTRODUODENOSCOPY (EGD) WITH PROPOFOL N/A 06/10/2015   Procedure: ESOPHAGOGASTRODUODENOSCOPY (EGD) WITH PROPOFOL;  Surgeon: Scot Jun, MD;  Location: [facility] ENDOSCOPY;  Service: Endoscopy;  Laterality: N/A;   HERNIA REPAIR     LEFT HEART CATH AND CORONARY ANGIOGRAPHY Left 12/05/2002   Procedure: CORONARY ANGIOPLASTY WITH STENT PLACEMENT; Location: [facility]; Surgeon: Randa Evens, MD   PACEMAKER IMPLANT N/A 05/05/2021   Procedure: PACEMAKER IMPLANT;  Surgeon: [name], MD;  Location: [facility] INVASIVE CV LAB;  Service: Cardiovascular;  Laterality: N/A;   SAVORY DILATION N/A 06/10/2015   Procedure: SAVORY DILATION;  Surgeon: Scot Jun, MD;  Location: [facility] ENDOSCOPY;  Service: Endoscopy;  Laterality: N/A;   TONSILLECTOMY       Current Outpatient Medications  Medication Sig Dispense  Refill   amiodarone (PACERONE) 200 MG tablet TAKE ONE TABLET BY MOUTH DAILY 30 tablet 3   atorvastatin (LIPITOR) 40 MG tablet TAKE 1 TABLET BY MOUTH EVERY MORNING 90 tablet 0   Carboxymethylcellul-Glycerin (LUBRICATING EYE DROPS OP) Place 1 drop into both eyes daily as needed (dry eyes).     Cholecalciferol (VITAMIN D3 PO) Take 1 capsule by mouth daily.     diltiazem (CARDIZEM) 30 MG tablet TAKE ONE TABLET EVERY FOUR HOURS AS NEEDED FOR AFIB HEART RATE >100 60 tablet 3   ELIQUIS 5 MG TABS tablet TAKE 1 TABLET BY MOUTH TWICE DAILY 60 tablet 6   ferrous sulfate 325 (65 FE) MG tablet Take 325 mg by mouth daily with breakfast.     Fiber-Vitamins-Minerals ([employer] FIBER GUMMIES) CHEW Chew by mouth.     finasteride (PROSCAR) 5 MG tablet Take 5 mg by mouth daily.     fluticasone (FLONASE) 50 MCG/ACT nasal spray Place 1 spray into both nostrils daily.     ipratropium (ATROVENT)  0.03 % nasal spray 1 spray as needed.     losartan (COZAAR) 25 MG tablet TAKE 1 TABLET BY MOUTH DAILY 90 tablet 0   methocarbamol (ROBAXIN) 500 MG tablet Take 500 mg by mouth 2 (two) times daily.     metoprolol succinate (TOPROL-XL) 25 MG 24 hr tablet Take 25 mg by mouth 2 (two) times daily at 10 AM and 5 PM.     Multiple Vitamin (MULTIVITAMIN) tablet Take 1 tablet by mouth daily.     Multiple Vitamins-Minerals (PRESERVISION AREDS 2) CAPS Take 1 capsule by mouth 2 (two) times daily.     nitroGLYCERIN (NITROSTAT) 0.4 MG SL tablet Place 1 nitroglycerin under your tongue, while sitting. If no relief of pain, may repeat 1 tablet every 5 minutes up to 3 tablets over 15 minutes. 25 tablet 3   pantoprazole (PROTONIX) 40 MG tablet TAKE 1 TABLET BY MOUTH AT BEDTIME 30 tablet 11   tamsulosin (FLOMAX) 0.4 MG CAPS capsule Take 1 capsule (0.4 mg total) by mouth daily. 30 capsule 11   cyanocobalamin 500 MCG tablet Take 500 mcg by mouth 2 (two) times daily. (Patient not taking: Reported on 06/08/2023)     No current facility-administered  medications for this visit.    Allergies:   Prednisone and Terazosin    Social History:  The patient  reports that he has never smoked. He has never used smokeless tobacco. He reports current alcohol use of about 1.0 standard drink of alcohol per week. He reports that he does not use drugs.   Family History:  The patient's family history includes Heart attack in his mother; Hypertension in his mother; Stroke in his mother and another family member.    ROS:  Please see the history of present illness.   Otherwise, review of systems are positive for none.   All other systems are reviewed and negative.    PHYSICAL EXAM: VS:  BP 138/70 (BP Location: Left Arm, Patient Position: Sitting, Cuff Size: Normal)   Pulse 66   Ht 5\' 10"  (1.778 m)   Wt 210 lb 8 oz (95.5 kg)   SpO2 98%   BMI 30.20 kg/m  , BMI Body mass index is 30.2 kg/m. GEN: Well nourished, well developed, in no acute distress  HEENT: normal  Neck: no JVD, carotid bruits, or masses Cardiac: RRR; no  rubs, or gallops,no edema .  1 /6 systolic murmur at the aortic area Respiratory:  clear to auscultation bilaterally, normal work of breathing GI: soft, nontender, nondistended, + BS MS: no deformity or atrophy  Skin: warm and dry, no rash Neuro:  Strength and sensation are intact Psych: euthymic mood, full affect   EKG:  EKG is ordered today. The ekg ordered today demonstrates : Atrial-paced rhythm with prolonged AV conduction Left axis deviation Low voltage QRS Inferior infarct , age undetermined Possible Anterolateral infarct , age undetermined   Recent Labs: 10/29/2022: BUN 16; Creatinine, Ser 1.24; Hemoglobin 13.1; Platelets 261; Potassium 3.8; Sodium 139    Lipid Panel    Component Value Date/Time   CHOL 115 10/08/2012 0554   TRIG 75 10/08/2012 0554   HDL 43 10/08/2012 0554   VLDL 15 10/08/2012 0554   LDLCALC 57 10/08/2012 0554      Wt Readings from Last 3 Encounters:  06/08/23 210 lb 8 oz (95.5 kg)   04/07/23 205 lb (93 kg)  12/29/22 201 lb 6 oz (91.3 kg)        ASSESSMENT AND PLAN:  1.  Coronary artery disease involving native coronary arteries without angina: He is doing well overall with no anginal symptoms.  No antiplatelet medication given that he is on anticoagulation.  I refilled sublingual nitroglycerin.  He has not required this lately.  2. Paroxysmal atrial fibrillation: He feels significantly better since he was placed on amiodarone in April.  He had a highly symptomatic episode at that time.  He is concerned about decreased vision since he was started on amiodarone and photosensitivity.  He is known to have macular degeneration.  I explained to him that the photosensitivity is likely due to amiodarone but decreased vision acuity is likely not.  I did give him the option of stopping amiodarone and increasing Toprol but he prefers to continue with current treatment as he feels much better with amiodarone in terms of his atrial fibrillation. I did ask him to follow-up with his ophthalmologist for an eye exam.  If this continues to be an issue, we can stop amiodarone and increase Toprol in 3 months.  3. Essential hypertension: Blood pressure is controlled.  4. Hyperlipidemia: I reviewed most recent lipid profile done in August which showed an LDL of 69 which is at target of less than 70.  Continue current dose of atorvastatin.    Disposition:   FU in 3 months.  Signed,  [name], MD  06/08/2023 4:32 PM    [facility]

## 2023-06-21 DIAGNOSIS — J069 Acute upper respiratory infection, unspecified: Secondary | ICD-10-CM | POA: Diagnosis not present

## 2023-06-21 DIAGNOSIS — Z20822 Contact with and (suspected) exposure to covid-19: Secondary | ICD-10-CM | POA: Diagnosis not present

## 2023-06-28 DIAGNOSIS — N1832 Chronic kidney disease, stage 3b: Secondary | ICD-10-CM | POA: Diagnosis not present

## 2023-06-28 DIAGNOSIS — R059 Cough, unspecified: Secondary | ICD-10-CM | POA: Diagnosis not present

## 2023-06-28 DIAGNOSIS — J984 Other disorders of lung: Secondary | ICD-10-CM | POA: Diagnosis not present

## 2023-06-28 DIAGNOSIS — J209 Acute bronchitis, unspecified: Secondary | ICD-10-CM | POA: Diagnosis not present

## 2023-06-28 DIAGNOSIS — R040 Epistaxis: Secondary | ICD-10-CM | POA: Diagnosis not present

## 2023-06-28 DIAGNOSIS — Z7901 Long term (current) use of anticoagulants: Secondary | ICD-10-CM | POA: Diagnosis not present

## 2023-06-28 DIAGNOSIS — J205 Acute bronchitis due to respiratory syncytial virus: Secondary | ICD-10-CM | POA: Diagnosis not present

## 2023-06-28 DIAGNOSIS — Z03818 Encounter for observation for suspected exposure to other biological agents ruled out: Secondary | ICD-10-CM | POA: Diagnosis not present

## 2023-06-28 DIAGNOSIS — R062 Wheezing: Secondary | ICD-10-CM | POA: Diagnosis not present

## 2023-06-28 DIAGNOSIS — J22 Unspecified acute lower respiratory infection: Secondary | ICD-10-CM | POA: Diagnosis not present

## 2023-06-28 DIAGNOSIS — Z950 Presence of cardiac pacemaker: Secondary | ICD-10-CM | POA: Diagnosis not present

## 2023-06-30 DIAGNOSIS — Z79899 Other long term (current) drug therapy: Secondary | ICD-10-CM | POA: Diagnosis not present

## 2023-06-30 DIAGNOSIS — R059 Cough, unspecified: Secondary | ICD-10-CM | POA: Diagnosis not present

## 2023-06-30 DIAGNOSIS — J210 Acute bronchiolitis due to respiratory syncytial virus: Secondary | ICD-10-CM | POA: Diagnosis not present

## 2023-06-30 DIAGNOSIS — R9431 Abnormal electrocardiogram [ECG] [EKG]: Secondary | ICD-10-CM | POA: Diagnosis not present

## 2023-06-30 DIAGNOSIS — I10 Essential (primary) hypertension: Secondary | ICD-10-CM | POA: Diagnosis not present

## 2023-06-30 DIAGNOSIS — N1832 Chronic kidney disease, stage 3b: Secondary | ICD-10-CM | POA: Diagnosis not present

## 2023-06-30 DIAGNOSIS — Z950 Presence of cardiac pacemaker: Secondary | ICD-10-CM | POA: Diagnosis not present

## 2023-06-30 DIAGNOSIS — D72829 Elevated white blood cell count, unspecified: Secondary | ICD-10-CM | POA: Diagnosis not present

## 2023-06-30 DIAGNOSIS — I4891 Unspecified atrial fibrillation: Secondary | ICD-10-CM | POA: Diagnosis not present

## 2023-06-30 DIAGNOSIS — Z7901 Long term (current) use of anticoagulants: Secondary | ICD-10-CM | POA: Diagnosis not present

## 2023-06-30 DIAGNOSIS — Z7982 Long term (current) use of aspirin: Secondary | ICD-10-CM | POA: Diagnosis not present

## 2023-06-30 DIAGNOSIS — I4510 Unspecified right bundle-branch block: Secondary | ICD-10-CM | POA: Diagnosis not present

## 2023-06-30 DIAGNOSIS — R0981 Nasal congestion: Secondary | ICD-10-CM | POA: Diagnosis not present

## 2023-06-30 DIAGNOSIS — E785 Hyperlipidemia, unspecified: Secondary | ICD-10-CM | POA: Diagnosis not present

## 2023-06-30 DIAGNOSIS — I2510 Atherosclerotic heart disease of native coronary artery without angina pectoris: Secondary | ICD-10-CM | POA: Diagnosis not present

## 2023-06-30 DIAGNOSIS — J22 Unspecified acute lower respiratory infection: Secondary | ICD-10-CM | POA: Diagnosis not present

## 2023-06-30 DIAGNOSIS — J205 Acute bronchitis due to respiratory syncytial virus: Secondary | ICD-10-CM | POA: Diagnosis not present

## 2023-07-01 DIAGNOSIS — J210 Acute bronchiolitis due to respiratory syncytial virus: Secondary | ICD-10-CM | POA: Diagnosis not present

## 2023-07-12 DIAGNOSIS — E7800 Pure hypercholesterolemia, unspecified: Secondary | ICD-10-CM | POA: Diagnosis not present

## 2023-07-12 DIAGNOSIS — Z79899 Other long term (current) drug therapy: Secondary | ICD-10-CM | POA: Diagnosis not present

## 2023-07-15 DIAGNOSIS — H43813 Vitreous degeneration, bilateral: Secondary | ICD-10-CM | POA: Diagnosis not present

## 2023-07-15 DIAGNOSIS — Z961 Presence of intraocular lens: Secondary | ICD-10-CM | POA: Diagnosis not present

## 2023-07-15 DIAGNOSIS — H353134 Nonexudative age-related macular degeneration, bilateral, advanced atrophic with subfoveal involvement: Secondary | ICD-10-CM | POA: Diagnosis not present

## 2023-07-19 DIAGNOSIS — Z1331 Encounter for screening for depression: Secondary | ICD-10-CM | POA: Diagnosis not present

## 2023-07-19 DIAGNOSIS — N183 Chronic kidney disease, stage 3 unspecified: Secondary | ICD-10-CM | POA: Diagnosis not present

## 2023-07-19 DIAGNOSIS — Z7901 Long term (current) use of anticoagulants: Secondary | ICD-10-CM | POA: Diagnosis not present

## 2023-07-19 DIAGNOSIS — D631 Anemia in chronic kidney disease: Secondary | ICD-10-CM | POA: Diagnosis not present

## 2023-07-19 DIAGNOSIS — N1832 Chronic kidney disease, stage 3b: Secondary | ICD-10-CM | POA: Diagnosis not present

## 2023-07-19 DIAGNOSIS — R7989 Other specified abnormal findings of blood chemistry: Secondary | ICD-10-CM | POA: Diagnosis not present

## 2023-07-19 DIAGNOSIS — I480 Paroxysmal atrial fibrillation: Secondary | ICD-10-CM | POA: Diagnosis not present

## 2023-07-19 DIAGNOSIS — I10 Essential (primary) hypertension: Secondary | ICD-10-CM | POA: Diagnosis not present

## 2023-07-19 DIAGNOSIS — Z0000 Encounter for general adult medical examination without abnormal findings: Secondary | ICD-10-CM | POA: Diagnosis not present

## 2023-07-19 DIAGNOSIS — E7800 Pure hypercholesterolemia, unspecified: Secondary | ICD-10-CM | POA: Diagnosis not present

## 2023-07-19 DIAGNOSIS — I2510 Atherosclerotic heart disease of native coronary artery without angina pectoris: Secondary | ICD-10-CM | POA: Diagnosis not present

## 2023-07-19 DIAGNOSIS — I5032 Chronic diastolic (congestive) heart failure: Secondary | ICD-10-CM | POA: Diagnosis not present

## 2023-07-21 DIAGNOSIS — R0602 Shortness of breath: Secondary | ICD-10-CM | POA: Diagnosis not present

## 2023-07-21 DIAGNOSIS — R059 Cough, unspecified: Secondary | ICD-10-CM | POA: Diagnosis not present

## 2023-07-21 DIAGNOSIS — J210 Acute bronchiolitis due to respiratory syncytial virus: Secondary | ICD-10-CM | POA: Diagnosis not present

## 2023-07-21 DIAGNOSIS — I77810 Thoracic aortic ectasia: Secondary | ICD-10-CM | POA: Diagnosis not present

## 2023-07-31 ENCOUNTER — Other Ambulatory Visit: Payer: Self-pay | Admitting: Cardiology

## 2023-08-03 ENCOUNTER — Other Ambulatory Visit: Payer: Self-pay | Admitting: Urology

## 2023-08-04 ENCOUNTER — Ambulatory Visit (INDEPENDENT_AMBULATORY_CARE_PROVIDER_SITE_OTHER): Payer: PPO

## 2023-08-04 ENCOUNTER — Encounter: Payer: PPO | Admitting: Cardiology

## 2023-08-04 DIAGNOSIS — I495 Sick sinus syndrome: Secondary | ICD-10-CM | POA: Diagnosis not present

## 2023-08-04 LAB — CUP PACEART REMOTE DEVICE CHECK
Battery Remaining Longevity: 141 mo
Battery Voltage: 3.02 V
Brady Statistic AP VP Percent: 0.13 %
Brady Statistic AP VS Percent: 87.31 %
Brady Statistic AS VP Percent: 0.03 %
Brady Statistic AS VS Percent: 12.53 %
Brady Statistic RA Percent Paced: 87.56 %
Brady Statistic RV Percent Paced: 0.16 %
Date Time Interrogation Session: 20250121214758
Implantable Lead Connection Status: 753985
Implantable Lead Connection Status: 753985
Implantable Lead Implant Date: 20221024
Implantable Lead Implant Date: 20221024
Implantable Lead Location: 753859
Implantable Lead Location: 753860
Implantable Lead Model: 5076
Implantable Lead Model: 5076
Implantable Pulse Generator Implant Date: 20221024
Lead Channel Impedance Value: 323 Ohm
Lead Channel Impedance Value: 323 Ohm
Lead Channel Impedance Value: 380 Ohm
Lead Channel Impedance Value: 437 Ohm
Lead Channel Pacing Threshold Amplitude: 0.5 V
Lead Channel Pacing Threshold Amplitude: 0.625 V
Lead Channel Pacing Threshold Pulse Width: 0.4 ms
Lead Channel Pacing Threshold Pulse Width: 0.4 ms
Lead Channel Sensing Intrinsic Amplitude: 0.5 mV
Lead Channel Sensing Intrinsic Amplitude: 0.5 mV
Lead Channel Sensing Intrinsic Amplitude: 5.5 mV
Lead Channel Sensing Intrinsic Amplitude: 5.5 mV
Lead Channel Setting Pacing Amplitude: 1.5 V
Lead Channel Setting Pacing Amplitude: 2 V
Lead Channel Setting Pacing Pulse Width: 0.4 ms
Lead Channel Setting Sensing Sensitivity: 0.9 mV
Zone Setting Status: 755011
Zone Setting Status: 755011

## 2023-08-05 ENCOUNTER — Encounter: Payer: Self-pay | Admitting: Cardiology

## 2023-08-12 NOTE — Progress Notes (Signed)
Electrophysiology Clinic Note    Date:  08/13/2023  Patient ID:  Benjamin Brewer, Benjamin Brewer 01-26-1931, MRN [id number] PCP:  [name], MD  Cardiologist:  [name], MD Electrophysiologist: [name], MD   Discussed the use of AI scribe software for clinical note transcription with the patient, who gave verbal consent to proceed.   Patient Profile    Chief Complaint: PPM, AFib follow-up  History of Present Illness: Benjamin Brewer is a 89 y.o. male with PMH notable for tachy-brady s/p PPM, SVT, parox AFib, CAD s/p PCI, HTN, macular degeneration; seen today for [name], MD for routine electrophysiology followup.  He last saw Dr. Lalla Brothers 04/2022 and was doing well at that time. He presented to [facility] ER 10/2022 in AFib w RVR, quite symptomatic. He was loaded on IV amiodarone and has continued it since that time.   On follow-up today, he is concerned about his low blood pressure and facial pressure around the eyes. He reports feeling lightheaded and sluggish when his blood pressure drops to the 100s. He also experiences pressure in the eyes, which he is unsure if it is related to his macular degeneration or low blood pressure. The patient's blood pressure typically ranges in the 120s or 130s, very rarely 140-160.   The patient also reports occasional itchiness around his pacemaker site but denies any pain or other issues. He recently recovered from RSV.  Diligently takes elquis BID, no bleeding concerns.   he denies chest pain, palpitations, dyspnea, PND, orthopnea, nausea, vomiting, dizziness, syncope, edema, weight gain, or early satiety.     Device Information: MDT dual chamber PPM, imp 04/2021  AAD History: Amiodarone - 10/2022    ROS:  Please see the history of present illness. All other systems are reviewed and otherwise negative.    Physical Exam    VS:  BP 108/60 (BP Location: Left Arm, Patient Position: Sitting, Cuff Size: Large)   Pulse 67    Ht 5\' 10"  (1.778 m)   Wt 206 lb 8 oz (93.7 kg)   SpO2 98%   BMI 29.63 kg/m  BMI: Body mass index is 29.63 kg/m.  Wt Readings from Last 3 Encounters:  08/13/23 206 lb 8 oz (93.7 kg)  06/08/23 210 lb 8 oz (95.5 kg)  04/07/23 205 lb (93 kg)     GEN- The patient is well appearing, alert and oriented x 3 today.   Lungs- Clear to ausculation bilaterally, normal work of breathing.  Heart- Regular rate and rhythm, no murmurs, rubs or gallops Extremities- No peripheral edema, warm, dry Skin-  device pocket well-healed, no tethering   Device interrogation done today and reviewed by myself:  Battery 11.6 years Lead thresholds, impedence, sensing stable  No recent episodes No changes made today   Studies Reviewed   Previous EP, cardiology notes.    EKG is ordered. Personal review of EKG from today shows:    EKG Interpretation Date/Time:  Friday August 13 2023 10:22:53 EST Ventricular Rate:  67 PR Interval:  204 QRS Duration:  100 QT Interval:  446 QTC Calculation: 471 R Axis:   -57  Text Interpretation: Normal sinus rhythm with Atrial premature contraction Left axis deviation Low voltage QRS Confirmed by Sherie Don [phone]) on 08/13/2023 11:53:38 AM    TTE, 01/15/2022  1. Left ventricular ejection fraction, by estimation, is 60 to 65%. The left ventricle has normal function. The left ventricle has no regional wall motion abnormalities. There is moderate left  ventricular hypertrophy. Left ventricular diastolic parameters are consistent with Grade I diastolic dysfunction (impaired relaxation).   2. Right ventricular systolic function is normal. The right ventricular size is normal. There is normal pulmonary artery systolic pressure.   3. Left atrial size was mildly dilated.   4. Right atrial size was mild to moderately dilated.   5. The mitral valve is grossly normal. Trivial mitral valve regurgitation. No evidence of mitral stenosis.   6. The aortic valve is tricuspid. There is  mild thickening of the aortic valve. Aortic valve regurgitation is not visualized. Aortic valve sclerosis is present, with no evidence of aortic valve stenosis.   7. Aortic dilatation noted. There is mild dilatation of the aortic root, measuring 42 mm.      Assessment and Plan     #) Tachy-brady syndrome s/p PPM Device functioning well, see paceart for details   #) parox AFib Maintaining sinus rhythm on amiodarone 100mg  daily Update thyroid labs today EKG with stable QTC   #) Hypercoag d/t parox afib CHA2DS2-VASc Score = at least 4 [CHF History: 0, HTN History: 1, Diabetes History: 0, Stroke History: 0, Vascular Disease History: 1, Age Score: 2, Gender Score: 0].  Therefore, the patient's annual risk of stroke is 4.8 %.    Stroke ppx - 5mg  eliquis Recent labwork with Cr 1.5 would require dose-reduction of eliquis. Baseline Cr appears 1.3-1.4. Update BMP   #) HTN #) borderline hypotension #) URI, possible dehydration Long discussion with patient regarding his blood pressure Shared decision making, we decided he would continue to monitor BP for the next month If he continues to be dizzy, LH with readings in the 100s, recommend stopping losartan.  Update BMP as above      Current medicines are reviewed at length with the patient today.   The patient has concerns regarding his medicines.  The following changes were made today:  none  Labs/ tests ordered today include:  Orders Placed This Encounter  Procedures   Basic metabolic panel   TSH   T4, free   EKG 12-Lead   EKG 12-Lead     Disposition: Follow up with Dr. Lalla Brothers or EP APP in 6 months  Patient to call office in about 38month with updated BP readings    Signed, Sherie Don, NP  08/13/23  11:54 AM  Electrophysiology [facility]

## 2023-08-13 ENCOUNTER — Ambulatory Visit: Payer: PPO | Attending: Cardiology | Admitting: Cardiology

## 2023-08-13 ENCOUNTER — Encounter: Payer: Self-pay | Admitting: Cardiology

## 2023-08-13 VITALS — BP 108/60 | HR 67 | Ht 70.0 in | Wt 206.5 lb

## 2023-08-13 DIAGNOSIS — I495 Sick sinus syndrome: Secondary | ICD-10-CM

## 2023-08-13 DIAGNOSIS — I480 Paroxysmal atrial fibrillation: Secondary | ICD-10-CM | POA: Diagnosis not present

## 2023-08-13 DIAGNOSIS — Z950 Presence of cardiac pacemaker: Secondary | ICD-10-CM | POA: Diagnosis not present

## 2023-08-13 DIAGNOSIS — I10 Essential (primary) hypertension: Secondary | ICD-10-CM

## 2023-08-13 LAB — CUP PACEART INCLINIC DEVICE CHECK
Date Time Interrogation Session: 20250131115934
Implantable Lead Connection Status: 753985
Implantable Lead Connection Status: 753985
Implantable Lead Implant Date: 20221024
Implantable Lead Implant Date: 20221024
Implantable Lead Location: 753859
Implantable Lead Location: 753860
Implantable Lead Model: 5076
Implantable Lead Model: 5076
Implantable Pulse Generator Implant Date: 20221024

## 2023-08-13 NOTE — Patient Instructions (Signed)
Medication Instructions:  The current medical regimen is effective;  continue present plan and medications.  *If you need a refill on your cardiac medications before your next appointment, please call your pharmacy*   Lab Work: Your provider would like for you to have following labs drawn today BMET, TSH, T4.   If you have labs (blood work) drawn today and your tests are completely normal, you will receive your results only by: MyChart Message (if you have MyChart) OR A paper copy in the mail If you have any lab test that is abnormal or we need to change your treatment, we will call you to review the results.   Follow-Up: At [facility], you and your health needs are our priority.  As part of our continuing mission to provide you with exceptional heart care, we have created designated Provider Care Teams.  These Care Teams include your primary Cardiologist (physician) and Advanced Practice Providers (APPs -  Physician Assistants and Nurse Practitioners) who all work together to provide you with the care you need, when you need it.  We recommend signing up for the patient portal called "MyChart".  Sign up information is provided on this After Visit Summary.  MyChart is used to connect with patients for Virtual Visits (Telemedicine).  Patients are able to view lab/test results, encounter notes, upcoming appointments, etc.  Non-urgent messages can be sent to your provider as well.   To learn more about what you can do with MyChart, go to [email or web address].    Your next appointment:   6 month(s)  Provider:   Steffanie Dunn, MD or Sherie Don, NP    Other Instructions Use blood pressure log provided- check blood pressures at home.

## 2023-08-14 LAB — BASIC METABOLIC PANEL
BUN/Creatinine Ratio: 14 (ref 10–24)
BUN: 21 mg/dL (ref 10–36)
CO2: 22 mmol/L (ref 20–29)
Calcium: 9.6 mg/dL (ref 8.6–10.2)
Chloride: 104 mmol/L (ref 96–106)
Creatinine, Ser: 1.49 mg/dL — ABNORMAL HIGH (ref 0.76–1.27)
Glucose: 70 mg/dL (ref 70–99)
Potassium: 4.7 mmol/L (ref 3.5–5.2)
Sodium: 142 mmol/L (ref 134–144)
eGFR: 44 mL/min/{1.73_m2} — ABNORMAL LOW (ref >59)

## 2023-08-14 LAB — TSH: TSH: 5.98 u[IU]/mL — ABNORMAL HIGH (ref 0.450–4.500)

## 2023-08-14 LAB — T4, FREE: Free T4: 1.22 ng/dL (ref 0.82–1.77)

## 2023-08-16 ENCOUNTER — Other Ambulatory Visit: Payer: Self-pay

## 2023-08-16 MED ORDER — RIVAROXABAN 15 MG PO TABS: 15.0000 mg | ORAL_TABLET | Freq: Every day | ORAL | 1 refills | Status: DC

## 2023-08-16 NOTE — Telephone Encounter (Signed)
Patient returned RN's call. Advised pt Benjamin Brewer has left for the day, but that I would send a message to triage for a callback.   Pt verbalized understanding. Please advise.

## 2023-08-16 NOTE — Telephone Encounter (Signed)
Spoke w/ pt.  Advised him of Suzanne's recommendation. He is agreeable and asks that his new rx be sent to [facility] for a 90 day supply. Advised him of the protocol for switching from Eliquis to Xarelto: "Switching between apixaban and anticoagulants other than warfarin (e.g. dabigatran, rivaroxaban): Discontinue apixaban and start new agent at next scheduled dose time" He verbalizes understanding and is appreciative of the call.

## 2023-09-13 ENCOUNTER — Other Ambulatory Visit: Payer: Self-pay | Admitting: Cardiovascular Disease

## 2023-09-14 DIAGNOSIS — Z955 Presence of coronary angioplasty implant and graft: Secondary | ICD-10-CM | POA: Diagnosis not present

## 2023-09-14 DIAGNOSIS — R9431 Abnormal electrocardiogram [ECG] [EKG]: Secondary | ICD-10-CM | POA: Diagnosis not present

## 2023-09-14 DIAGNOSIS — I471 Supraventricular tachycardia, unspecified: Secondary | ICD-10-CM | POA: Diagnosis not present

## 2023-09-14 DIAGNOSIS — D649 Anemia, unspecified: Secondary | ICD-10-CM | POA: Diagnosis not present

## 2023-09-14 DIAGNOSIS — Z7982 Long term (current) use of aspirin: Secondary | ICD-10-CM | POA: Diagnosis not present

## 2023-09-14 DIAGNOSIS — Z888 Allergy status to other drugs, medicaments and biological substances status: Secondary | ICD-10-CM | POA: Diagnosis not present

## 2023-09-14 DIAGNOSIS — E785 Hyperlipidemia, unspecified: Secondary | ICD-10-CM | POA: Diagnosis not present

## 2023-09-14 DIAGNOSIS — I10 Essential (primary) hypertension: Secondary | ICD-10-CM | POA: Diagnosis not present

## 2023-09-14 DIAGNOSIS — I495 Sick sinus syndrome: Secondary | ICD-10-CM | POA: Diagnosis not present

## 2023-09-14 DIAGNOSIS — Z79899 Other long term (current) drug therapy: Secondary | ICD-10-CM | POA: Diagnosis not present

## 2023-09-14 DIAGNOSIS — I2510 Atherosclerotic heart disease of native coronary artery without angina pectoris: Secondary | ICD-10-CM | POA: Diagnosis not present

## 2023-09-14 DIAGNOSIS — I4891 Unspecified atrial fibrillation: Secondary | ICD-10-CM | POA: Diagnosis not present

## 2023-09-14 DIAGNOSIS — R042 Hemoptysis: Secondary | ICD-10-CM | POA: Diagnosis not present

## 2023-09-14 DIAGNOSIS — Z7901 Long term (current) use of anticoagulants: Secondary | ICD-10-CM | POA: Diagnosis not present

## 2023-09-14 DIAGNOSIS — Z950 Presence of cardiac pacemaker: Secondary | ICD-10-CM | POA: Diagnosis not present

## 2023-09-14 DIAGNOSIS — R0602 Shortness of breath: Secondary | ICD-10-CM | POA: Diagnosis not present

## 2023-09-14 DIAGNOSIS — R059 Cough, unspecified: Secondary | ICD-10-CM | POA: Diagnosis not present

## 2023-09-16 ENCOUNTER — Telehealth: Payer: Self-pay | Admitting: Cardiology

## 2023-09-16 ENCOUNTER — Ambulatory Visit: Attending: Cardiology | Admitting: Cardiology

## 2023-09-16 VITALS — BP 164/88 | HR 84 | Ht 70.0 in | Wt 209.2 lb

## 2023-09-16 DIAGNOSIS — I480 Paroxysmal atrial fibrillation: Secondary | ICD-10-CM

## 2023-09-16 DIAGNOSIS — D6869 Other thrombophilia: Secondary | ICD-10-CM | POA: Diagnosis not present

## 2023-09-16 DIAGNOSIS — R042 Hemoptysis: Secondary | ICD-10-CM | POA: Diagnosis not present

## 2023-09-16 NOTE — Patient Instructions (Addendum)
 Medication Instructions:  The current medical regimen is effective;  continue present plan and medications.  *If you need a refill on your cardiac medications before your next appointment, please call your pharmacy*   Lab Work: Your provider would like for you to have following labs drawn today CBC.   If you have labs (blood work) drawn today and your tests are completely normal, you will receive your results only by: MyChart Message (if you have MyChart) OR A paper copy in the mail If you have any lab test that is abnormal or we need to change your treatment, we will call you to review the results.   Follow-Up: At [facility], you and your health needs are our priority.  As part of our continuing mission to provide you with exceptional heart care, we have created designated Provider Care Teams.  These Care Teams include your primary Cardiologist (physician) and Advanced Practice Providers (APPs -  Physician Assistants and Nurse Practitioners) who all work together to provide you with the care you need, when you need it.  We recommend signing up for the patient portal called "MyChart".  Sign up information is provided on this After Visit Summary.  MyChart is used to connect with patients for Virtual Visits (Telemedicine).  Patients are able to view lab/test results, encounter notes, upcoming appointments, etc.  Non-urgent messages can be sent to your provider as well.   To learn more about what you can do with MyChart, go to [email or web address].    Your next appointment:   1 month(s)  Provider:   Sherie Don, NP   Try nasal spray / rinse for dry nose- [employer] brand

## 2023-09-16 NOTE — Telephone Encounter (Signed)
 The patient has been set up with an appointment today with Sherie Don, NP.

## 2023-09-16 NOTE — Telephone Encounter (Signed)
 Patient stated he is experiencing bleeding in his mouth the past week especially at night. Has been to [facility] and [facility] ER. He is concerned it may be a side effect of a medication Xarelto but wants to speak to someone asap.   Pt c/o medication issue:  1. Name of Medication: Xarelto  2. How are you currently taking this medication (dosage and times per day)? Once a day  3. Are you having a reaction (difficulty breathing--STAT)? Yes, shortness of breath  4. What is your medication issue? Patient stated he is experiencing bleeding in his mouth the past week especially at night. Has been to [facility] and [facility] ER. He is concerned it may be a side effect of a medication Xarelto but wants to speak to someone asap.   Patient is in the lobby.

## 2023-09-16 NOTE — Progress Notes (Signed)
 Electrophysiology Clinic Note    Date:  09/16/2023  Patient ID:  Benjamin Brewer, Benjamin Brewer [birth year], MRN [id number] PCP:  [name], MD  Cardiologist:  [name], MD Electrophysiologist: [name], MD   Discussed the use of AI scribe software for clinical note transcription with the patient, who gave verbal consent to proceed.   Patient Profile    Chief Complaint: nasal bleeding on xarelto  History of Present Illness: Benjamin Brewer is a 89 y.o. male with PMH notable for tachy-brady s/p PPM, SVT, parox AFib, CAD s/p PCI, HTN, macular degeneration; seen today for [name], MD for acute visit d/t increase bleeding on xarelto.   He last saw Dr. Lalla Brothers 04/2022 and was doing well at that time. He presented to [facility] ER 10/2022 in AFib w RVR, quite symptomatic. He was loaded on IV amiodarone and has continued it since that time.  I saw him 07/2023 where he was overall doing well, intermittently dizzy with position changes. His kidney function had worsened and variable between 1.5-1.4, so I switched him from 5mg  eliquis BID to 15mg  xarelto daily.   Earlier this week, he had a nose bleed where he thinks blood was both dripping from R nares and down his throat and he was coughing it back up.  He attempted to see his PCP, who did not have clinic availability. 2 days later, he had another nose bleed and went to [facility] ER for eval. Labs stable, cxr stable, CTA Chest without concern.   He then had recurrence of nose bleed yesterday evening which prompted his visit today. He is concerned that the xarelto is the cause of the bleeding and walked in to clinic to request a different medication.  He has taken xarelto daily in the morning. No bleeding through GI/GU systems. He never had nose bleeds on eliquis.   He checks BP regularly at home, most readings 120-140, rarely 90-100 systolic.  He continues to have intermittent bendopnea.   he denies chest pain, palpitations,  dyspnea, PND, orthopnea, nausea, vomiting, edema, weight gain, or early satiety.     Device Information: MDT dual chamber PPM, imp 04/2021  AAD History: Amiodarone - 10/2022    ROS:  Please see the history of present illness. All other systems are reviewed and otherwise negative.    Physical Exam    VS:  BP (!) 164/88 (BP Location: Left Arm, Patient Position: Sitting, Cuff Size: Normal)   Pulse 84   Ht 5\' 10"  (1.778 m)   Wt 209 lb 3.2 oz (94.9 kg)   SpO2 98%   BMI 30.02 kg/m  BMI: Body mass index is 30.02 kg/m.  Wt Readings from Last 3 Encounters:  09/16/23 209 lb 3.2 oz (94.9 kg)  08/13/23 206 lb 8 oz (93.7 kg)  06/08/23 210 lb 8 oz (95.5 kg)     GEN- The patient is well appearing, alert and oriented x 3 today.   Lungs- Clear to ausculation bilaterally, normal work of breathing.  Heart- Regular rate and rhythm, no murmurs, rubs or gallops Extremities- No peripheral edema, warm, dry Skin-  device pocket well-healed, no tethering   Device interrogation not performed today.     Studies Reviewed   Previous EP, cardiology notes.    EKG is not ordered. Personal review of EKG from today shows:         TTE, 01/15/2022  1. Left ventricular ejection fraction, by estimation, is 60 to 65%. The left ventricle has  normal function. The left ventricle has no regional wall motion abnormalities. There is moderate left ventricular hypertrophy. Left ventricular diastolic parameters are consistent with Grade I diastolic dysfunction (impaired relaxation).   2. Right ventricular systolic function is normal. The right ventricular size is normal. There is normal pulmonary artery systolic pressure.   3. Left atrial size was mildly dilated.   4. Right atrial size was mild to moderately dilated.   5. The mitral valve is grossly normal. Trivial mitral valve regurgitation. No evidence of mitral stenosis.   6. The aortic valve is tricuspid. There is mild thickening of the aortic valve. Aortic  valve regurgitation is not visualized. Aortic valve sclerosis is present, with no evidence of aortic valve stenosis.   7. Aortic dilatation noted. There is mild dilatation of the aortic root, measuring 42 mm.      Assessment and Plan     #) parox AFib #) hypercoag d/t Afib #) epistaxis  Low AF burden via most recent device interrogation Chads vasc score = 4 (HTN, vasc dz, age x 2) Long discussion with patient regarding need for [facility] and dosing with his borderline Cr of 1.4-1.5 range.  Using shared decision making, we agreed to continue 15mg  xarelto daily with evening meal Reassurance provided that Hgb was stable at [facility] ER visit Will update CBC today given ongoing bleeding.  He will trial nasal moisturizer to see if it improves epistaxis  If epistaxis continues, consider close monitoring of AF via PPM and stopping OAC, vs watchman device.     Close follow-up scheduled in 1 month with me       Current medicines are reviewed at length with the patient today.   The patient has concerns regarding his medicines.  The following changes were made today:  none  Labs/ tests ordered today include:  Orders Placed This Encounter  Procedures   CBC     Disposition: Follow up with EP APP in 4 weeks     Signed, Sherie Don, NP  09/16/23  4:14 PM  Electrophysiology [facility]

## 2023-09-17 LAB — CBC
Hematocrit: 32.4 % — ABNORMAL LOW (ref 37.5–51.0)
Hemoglobin: 10.8 g/dL — ABNORMAL LOW (ref 13.0–17.7)
MCH: 32.3 pg (ref 26.6–33.0)
MCHC: 33.3 g/dL (ref 31.5–35.7)
MCV: 97 fL (ref 79–97)
Platelets: 294 10*3/uL (ref 150–450)
RBC: 3.34 x10E6/uL — ABNORMAL LOW (ref 4.14–5.80)
RDW: 12.8 % (ref 11.6–15.4)
WBC: 6.8 10*3/uL (ref 3.4–10.8)

## 2023-09-17 NOTE — Progress Notes (Signed)
 Remote pacemaker transmission.

## 2023-10-06 DIAGNOSIS — M25461 Effusion, right knee: Secondary | ICD-10-CM | POA: Diagnosis not present

## 2023-10-06 DIAGNOSIS — G8929 Other chronic pain: Secondary | ICD-10-CM | POA: Diagnosis not present

## 2023-10-06 DIAGNOSIS — M170 Bilateral primary osteoarthritis of knee: Secondary | ICD-10-CM | POA: Diagnosis not present

## 2023-10-20 DIAGNOSIS — Z03818 Encounter for observation for suspected exposure to other biological agents ruled out: Secondary | ICD-10-CM | POA: Diagnosis not present

## 2023-10-20 DIAGNOSIS — J029 Acute pharyngitis, unspecified: Secondary | ICD-10-CM | POA: Diagnosis not present

## 2023-10-20 DIAGNOSIS — J0110 Acute frontal sinusitis, unspecified: Secondary | ICD-10-CM | POA: Diagnosis not present

## 2023-10-20 DIAGNOSIS — R051 Acute cough: Secondary | ICD-10-CM | POA: Diagnosis not present

## 2023-10-20 DIAGNOSIS — J22 Unspecified acute lower respiratory infection: Secondary | ICD-10-CM | POA: Diagnosis not present

## 2023-10-24 NOTE — Progress Notes (Unsigned)
 Electrophysiology Clinic Note    Date:  10/25/2023  Patient ID:  Benjamin Brewer, Benjamin Brewer 1931-05-19, MRN [id number] PCP:  Nestor Banter, MD  Cardiologist:  Antionette Kirks, MD Electrophysiologist: Boyce Byes, MD   Discussed the use of AI scribe software for clinical note transcription with the patient, who gave verbal consent to proceed.   Patient Profile    Chief Complaint: xarelto follow-up  History of Present Illness: Benjamin Brewer is a 89 y.o. male with PMH notable for tachy-brady s/p PPM, SVT, parox AFib, CAD s/p PCI, HTN, macular degeneration; seen today for Boyce Byes, MD for acute visit d/t increase bleeding on xarelto.   He last saw Dr. Marven Slimmer 04/2022 and was doing well at that time. He presented to [facility] ER 10/2022 in AFib w RVR, quite symptomatic. He was loaded on IV amiodarone and has continued it since that time.  I saw him 07/2023 where he was overall doing well, intermittently dizzy with position changes. His kidney function had worsened and variable between 1.5-1.4, so I switched him from 5mg  eliquis BID to 15mg  xarelto daily.  I last saw him 09/2023 after multiple episodes of epistaxis, no h/o such. He was concerned the xarelto was the cause. HgB was stable at 10.8 (previously 11.2). Xarelto was continued with close follow-up.   On follow-up today, he has had no further significant nose bleeds episodes. His main symptom today is LH and dizziness. He states his BP has been jumping around, and he has seen systolic readings as low as 89. When he saw this reading, he drank a bottle of water and it improved.   He is not aware of any recent AF episodes.     Device Information: MDT dual chamber PPM, imp 04/2021  AAD History: Amiodarone - 10/2022    ROS:  Please see the history of present illness. All other systems are reviewed and otherwise negative.    Physical Exam    VS:  BP (!) 102/54   Pulse 70   Ht 5\' 10"  (1.778 m)   Wt 208 lb (94.3 kg)    SpO2 97%   BMI 29.84 kg/m  BMI: Body mass index is 29.84 kg/m.  Wt Readings from Last 3 Encounters:  10/25/23 208 lb (94.3 kg)  09/16/23 209 lb 3.2 oz (94.9 kg)  08/13/23 206 lb 8 oz (93.7 kg)     GEN- The patient is well appearing, alert and oriented x 3 today.   Lungs- Clear to ausculation bilaterally, normal work of breathing.  Heart- Regular rate and rhythm, no murmurs, rubs or gallops Extremities- No peripheral edema, warm, dry Skin-  device pocket well-healed, no tethering    Device interrogation done today and reviewed by myself:  Battery 11 years Lead thresholds, impedence, sensing stable  No episodes Good HR histogram No changes made today    Studies Reviewed   Previous EP, cardiology notes.    EKG is ordered. Personal review of EKG from today shows:    EKG Interpretation Date/Time:  Monday October 25 2023 10:31:03 EDT Ventricular Rate:  70 PR Interval:  236 QRS Duration:  104 QT Interval:  428 QTC Calculation: 462 R Axis:   -24  Text Interpretation: Atrial-paced rhythm with prolonged AV conduction Confirmed by Bintou Lafata [phone] on 10/25/2023 10:47:01 AM    TTE, 01/15/2022  1. Left ventricular ejection fraction, by estimation, is 60 to 65%. The left ventricle has normal function. The left ventricle has no regional wall motion  abnormalities. There is moderate left ventricular hypertrophy. Left ventricular diastolic parameters are consistent with Grade I diastolic dysfunction (impaired relaxation).   2. Right ventricular systolic function is normal. The right ventricular size is normal. There is normal pulmonary artery systolic pressure.   3. Left atrial size was mildly dilated.   4. Right atrial size was mild to moderately dilated.   5. The mitral valve is grossly normal. Trivial mitral valve regurgitation. No evidence of mitral stenosis.   6. The aortic valve is tricuspid. There is mild thickening of the aortic valve. Aortic valve regurgitation is not  visualized. Aortic valve sclerosis is present, with no evidence of aortic valve stenosis.   7. Aortic dilatation noted. There is mild dilatation of the aortic root, measuring 42 mm.      Assessment and Plan    #) Medication Management #) hypotension On multiple medications, unable to reconcile medication list - Review and verify medication list at home. - Contact clinic if discrepancies are found. - Ensure no additional antihypertensives are taken, specifically losartan. - recommend he discuss finasteride with PCP, consider stopping - can consider reducing toprol to 25mg  daily  #) parox AFib No recent AF episodes on 100mg  amiodarone daily Continue 25mg  toprol BID at this time Recent thyroid and LFTs stable, wil update at next visit   #) Hypercoag d/t parox afib #) h/o epistaxis CHA2DS2-VASc Score = at least 4 [CHF History: 0, HTN History: 1, Diabetes History: 0, Stroke History: 0, Vascular Disease History: 1, Age Score: 2, Gender Score: 0].  Therefore, the patient's annual risk of stroke is 4.8 %.    Stroke ppx - 15mg  xarelto, appropriately dosed for CrCl < 63ml/min No recent nose bleed episodes   #) tachy-brady s/p PPM Device functioning well, see paceart for details     Current medicines are reviewed at length with the patient today.   The patient has concerns regarding his medicines.  The following changes were made today:  none  Labs/ tests ordered today include:  Orders Placed This Encounter  Procedures   EKG 12-Lead     Disposition: Follow up with Dr. Marven Slimmer or EP APP in 6 months     Signed, Hernando Reali, NP  10/25/23  5:11 PM  Electrophysiology [facility]

## 2023-10-25 ENCOUNTER — Encounter: Payer: Self-pay | Admitting: Cardiology

## 2023-10-25 ENCOUNTER — Ambulatory Visit: Attending: Cardiology | Admitting: Cardiology

## 2023-10-25 VITALS — BP 102/54 | HR 70 | Ht 70.0 in | Wt 208.0 lb

## 2023-10-25 DIAGNOSIS — I495 Sick sinus syndrome: Secondary | ICD-10-CM

## 2023-10-25 DIAGNOSIS — Z950 Presence of cardiac pacemaker: Secondary | ICD-10-CM | POA: Diagnosis not present

## 2023-10-25 DIAGNOSIS — D6869 Other thrombophilia: Secondary | ICD-10-CM | POA: Diagnosis not present

## 2023-10-25 DIAGNOSIS — I480 Paroxysmal atrial fibrillation: Secondary | ICD-10-CM | POA: Diagnosis not present

## 2023-10-25 DIAGNOSIS — I10 Essential (primary) hypertension: Secondary | ICD-10-CM

## 2023-10-25 NOTE — Patient Instructions (Signed)
 Medication Instructions:  Please review your current medication list given to you today, review with your current medications at home and call to update us .   *If you need a refill on your cardiac medications before your next appointment, please call your pharmacy*  Follow-Up: At [facility], you and your health needs are our priority.  As part of our continuing mission to provide you with exceptional heart care, our providers are all part of one team.  This team includes your primary Cardiologist (physician) and Advanced Practice Providers or APPs (Physician Assistants and Nurse Practitioners) who all work together to provide you with the care you need, when you need it.  Your next appointment:   6 month(s)  Provider:   Harvie Liner, MD or Suzann Riddle, NP    We recommend signing up for the patient portal called "MyChart".  Sign up information is provided on this After Visit Summary.  MyChart is used to connect with patients for Virtual Visits (Telemedicine).  Patients are able to view lab/test results, encounter notes, upcoming appointments, etc.  Non-urgent messages can be sent to your provider as well.   To learn more about what you can do with MyChart, go to [email or web address].   Other Instructions Please see your primary care provider.

## 2023-11-03 ENCOUNTER — Ambulatory Visit (INDEPENDENT_AMBULATORY_CARE_PROVIDER_SITE_OTHER): Payer: PPO

## 2023-11-03 DIAGNOSIS — I495 Sick sinus syndrome: Secondary | ICD-10-CM

## 2023-11-04 ENCOUNTER — Telehealth: Payer: Self-pay | Admitting: Cardiology

## 2023-11-04 LAB — CUP PACEART REMOTE DEVICE CHECK
Battery Remaining Longevity: 136 mo
Battery Voltage: 3.01 V
Brady Statistic AP VP Percent: 0.32 %
Brady Statistic AP VS Percent: 64.65 %
Brady Statistic AS VP Percent: 0.07 %
Brady Statistic AS VS Percent: 34.97 %
Brady Statistic RA Percent Paced: 65.46 %
Brady Statistic RV Percent Paced: 0.38 %
Date Time Interrogation Session: 20250422221553
Implantable Lead Connection Status: 753985
Implantable Lead Connection Status: 753985
Implantable Lead Implant Date: 20221024
Implantable Lead Implant Date: 20221024
Implantable Lead Location: 753859
Implantable Lead Location: 753860
Implantable Lead Model: 5076
Implantable Lead Model: 5076
Implantable Pulse Generator Implant Date: 20221024
Lead Channel Impedance Value: 304 Ohm
Lead Channel Impedance Value: 323 Ohm
Lead Channel Impedance Value: 361 Ohm
Lead Channel Impedance Value: 399 Ohm
Lead Channel Pacing Threshold Amplitude: 0.375 V
Lead Channel Pacing Threshold Amplitude: 0.75 V
Lead Channel Pacing Threshold Pulse Width: 0.4 ms
Lead Channel Pacing Threshold Pulse Width: 0.4 ms
Lead Channel Sensing Intrinsic Amplitude: 0.375 mV
Lead Channel Sensing Intrinsic Amplitude: 0.375 mV
Lead Channel Sensing Intrinsic Amplitude: 5.25 mV
Lead Channel Sensing Intrinsic Amplitude: 5.25 mV
Lead Channel Setting Pacing Amplitude: 1.5 V
Lead Channel Setting Pacing Amplitude: 2 V
Lead Channel Setting Pacing Pulse Width: 0.4 ms
Lead Channel Setting Sensing Sensitivity: 0.9 mV
Zone Setting Status: 755011
Zone Setting Status: 755011

## 2023-11-04 NOTE — Telephone Encounter (Signed)
 New Message:      Patient said he is supposed to call and go over his Medication list with his nurse

## 2023-11-04 NOTE — Telephone Encounter (Signed)
 Called patient, he states he would like to drop off the  medication list- advised this was fine and I can update list.   Patient verbalized understanding, thankful for call back.

## 2023-11-06 ENCOUNTER — Encounter: Payer: Self-pay | Admitting: Cardiology

## 2023-11-09 DIAGNOSIS — R7989 Other specified abnormal findings of blood chemistry: Secondary | ICD-10-CM | POA: Diagnosis not present

## 2023-11-09 DIAGNOSIS — I10 Essential (primary) hypertension: Secondary | ICD-10-CM | POA: Diagnosis not present

## 2023-11-09 DIAGNOSIS — I480 Paroxysmal atrial fibrillation: Secondary | ICD-10-CM | POA: Diagnosis not present

## 2023-11-09 DIAGNOSIS — N183 Chronic kidney disease, stage 3 unspecified: Secondary | ICD-10-CM | POA: Diagnosis not present

## 2023-11-09 DIAGNOSIS — N1832 Chronic kidney disease, stage 3b: Secondary | ICD-10-CM | POA: Diagnosis not present

## 2023-11-09 DIAGNOSIS — E7800 Pure hypercholesterolemia, unspecified: Secondary | ICD-10-CM | POA: Diagnosis not present

## 2023-11-09 DIAGNOSIS — D631 Anemia in chronic kidney disease: Secondary | ICD-10-CM | POA: Diagnosis not present

## 2023-11-10 DIAGNOSIS — G8929 Other chronic pain: Secondary | ICD-10-CM | POA: Diagnosis not present

## 2023-11-10 DIAGNOSIS — M25461 Effusion, right knee: Secondary | ICD-10-CM | POA: Diagnosis not present

## 2023-11-10 DIAGNOSIS — M1711 Unilateral primary osteoarthritis, right knee: Secondary | ICD-10-CM | POA: Diagnosis not present

## 2023-11-24 ENCOUNTER — Other Ambulatory Visit: Payer: Self-pay

## 2023-11-24 ENCOUNTER — Telehealth: Payer: Self-pay | Admitting: Cardiovascular Disease

## 2023-11-24 DIAGNOSIS — R0602 Shortness of breath: Secondary | ICD-10-CM

## 2023-11-24 NOTE — Telephone Encounter (Signed)
 LVM to schedule echo & follow up appt, please schedule.

## 2023-11-24 NOTE — Telephone Encounter (Signed)
 Pt called back and we have called tech support for help and his handheld needs to be replaced and it will take 7-10 business days

## 2023-11-24 NOTE — Telephone Encounter (Signed)
 Pt c/o Shortness Of Breath: STAT if SOB developed within the last 24 hours or pt is noticeably SOB on the phone  1. Are you currently SOB (can you hear that pt is SOB on the phone)? yes  2. How long have you been experiencing SOB? 1-2 wks w/ no improvement  3. Are you SOB when sitting or when up moving around? Up moving around or bend over  4. Are you currently experiencing any other symptoms? dizziness

## 2023-11-24 NOTE — Telephone Encounter (Signed)
 He needs an echocardiogram and a follow-up visit.

## 2023-11-24 NOTE — Telephone Encounter (Signed)
 Disregard message below.

## 2023-11-24 NOTE — Telephone Encounter (Signed)
 Called patient to send a remote transmission. States he is not home but will go home now to send. Patient advised to call the device clinic at [phone] so we can assist sending a remote transmission. Pt is agreeable to plan.

## 2023-11-24 NOTE — Telephone Encounter (Signed)
 Spoke with pt over the phone and he had stated that he is having shortness of breath and dizziness with movement (bending over or changing positions). Pt stated HR has been in the 80s and SBP in the 130s/140s. Pt does have an inhaler prescribed to be used every 6 hrs PRN for wheezing or shortness of breath. Pt stated he has been using it but not every day and just once daily. Advised pt to used inhaler based off of prescribed instructions to help with the shortness of breath. Pt stated he has spoke with PCP office but they stated they felt it was cardiac related. Pt last echo was in July 2023 and echo results noted unremarkable with EF 60-65%. Pt stated he has not had a dizziness/ shortness of breath episode like this since 2023 when these tests were completed however, this seems worse. Explained to pt that this call was sent to the [facility] office and we would forward all of this information to the [place] office for Dr. Alvenia Aus and his staff to review. Pt verbalized understanding of plan.

## 2023-11-24 NOTE — Telephone Encounter (Signed)
 5/14 patient is heading home to send manual transmission.  Please assist patient send when he returns call.

## 2023-11-24 NOTE — Telephone Encounter (Signed)
 Scheduling team:   Echo ordered for shortness of breath- please schedule, and then schedule follow up after.   Thank you!

## 2023-11-25 NOTE — Telephone Encounter (Signed)
 Device function WNL.  No episodes.

## 2023-12-01 ENCOUNTER — Other Ambulatory Visit: Payer: Self-pay | Admitting: Cardiovascular Disease

## 2023-12-10 ENCOUNTER — Ambulatory Visit: Attending: Cardiovascular Disease

## 2023-12-10 DIAGNOSIS — R0602 Shortness of breath: Secondary | ICD-10-CM | POA: Diagnosis not present

## 2023-12-11 NOTE — Progress Notes (Unsigned)
 12/14/2023 7:46 AM   Benjamin Brewer 06/12/1931 [id number]  Referring provider: Nestor Banter, MD [phone] [address] [place],  [place] [address]  Urological history: 1. BPH -PSA (05/2020) 0.86 -finasteride  5 mg daily   2. Phimosis - dorsal slit (11/2022)  No chief complaint on file.  HPI: Benjamin Brewer is a 89 y.o. male who presents today for urinary frequency and recheck on dorsal slit.   Previous records reviewed.   I PSS ***  PVR ***  UA ***       Score:  1-7 Mild 8-19 Moderate 20-35 Severe    PMH: Past Medical History:  Diagnosis Date   A-fib (HCC)    a.) CHA2DS2VASc = 4 (age x 2, HTN, vascular disease history); b.) rate/rhythm maintained on oral amiodarone  + diltiazem  + metoprolol  succinate; chronically anticoagulated with apixaban    Anemia    Angina pectoris (HCC)    Aortic atherosclerosis (HCC)    Aortic root dilatation (HCC) 05/09/2020   a.) TTE 05/09/2020: 38 mm; b.) TTE 02/04/2022: 42 mm   Arthritis of both hands    Benign prostatic hypertrophy    CAD (coronary artery disease)    a.) LHC 12/05/2002: 90% prox diagonal, 30% pLCx, 30% mLCx, 20% pOM1, 40% pRCA, 30% mRCA - med mgmt; b.) LHC/PCI 05/15/2008: 99% pRCA (2.75 x 15 mm Promus DES), 80% mLAD (3.5 x 18 mm Promus DES)   CKD (chronic kidney disease), stage III (HCC)    Dental root implant present    Diastolic dysfunction    a.) TTE 09/04/2014: EF 55-60%, RVE, LAE, mild MR, G1DD; b.) TTE 01/15/2018: EF 60-65%, LAE, AoV sclerosis, triv MR/TR/PR, G1DD; c.) TTE 05/09/2020: EF 55-60%, LAD, mild-mod AoV sclerosis, G1DD; d.) TTE 04/25/2021: EF 60-65%, LVH, mild-mod MR, G1DD; e.) TTE 02/04/2022: EF 60-65%, mod LVH, LAE, mod RAE, AoV sclerosis, triv MR, G1DD   DOE (dyspnea on exertion)    Dyslipidemia    GERD (gastroesophageal reflux disease)    History of 2019 novel coronavirus disease (COVID-19) 11/2020   Hyperlipidemia    Hypertension    Long  term current use of amiodarone     NSVT (nonsustained ventricular tachycardia) (HCC) 02/12/2021   a.) holter 02/12/2021: 17 beat run at a maximum rate of 154 bpm   Numbness and tingling of both lower extremities    Phimosis of penis    Pneumonia    Presence of permanent cardiac pacemaker 05/05/2021   a.) s/p dual-chamber PPM 05/05/2021; MDT Azure XT DR MRI SureScan (SN: [id number])   PSVT (paroxysmal supraventricular tachycardia) (HCC) 01/12/2018   a.) holter 01/12/2018: PSVT runs --> longest lasting 14 beats at max rate of 142 bpm; b.) holter 02/12/2021: PSVT x 88 runs  --> longest lasting 5 beats at a maximum rat of 182 bpm   Tachy-brady syndrome (HCC)    a.) s/p PPM placement 05/05/2021   Vertigo     Surgical History: Past Surgical History:  Procedure Laterality Date   CARDIAC CATHETERIZATION     [facility]   CATARACT EXTRACTION [facility] Left 07/03/2019   Procedure: CATARACT EXTRACTION PHACO AND INTRAOCULAR LENS PLACEMENT (IOC) LEFT 10.19  01:06.9;  Surgeon: Rosa College, MD;  Location: [facility];  Service: Ophthalmology;  Laterality: Left;   COLONOSCOPY     CORONARY ANGIOPLASTY WITH STENT PLACEMENT Left 05/15/2008   Procedure: CORONARY ANGIOPLASTY WITH STENT PLACEMENT; Location: [facility]; Surgeon: Maya Sparrow, MD   DORSAL SLIT N/A 11/30/2022  Procedure: DORSAL SLIT;  Surgeon: Dustin Gimenez, MD;  Location: [facility] ORS;  Service: Urology;  Laterality: N/A;   ESOPHAGOGASTRODUODENOSCOPY (EGD) WITH PROPOFOL  N/A 06/10/2015   Procedure: ESOPHAGOGASTRODUODENOSCOPY (EGD) WITH PROPOFOL ;  Surgeon: Cassie Click, MD;  Location: [facility] ENDOSCOPY;  Service: Endoscopy;  Laterality: N/A;   HERNIA REPAIR     LEFT HEART CATH AND CORONARY ANGIOGRAPHY Left 12/05/2002   Procedure: CORONARY ANGIOPLASTY WITH STENT PLACEMENT; Location: [facility]; Surgeon: Cardell Chang, MD   PACEMAKER IMPLANT N/A 05/05/2021   Procedure: PACEMAKER IMPLANT;  Surgeon: Boyce Byes, MD;   Location: [facility] INVASIVE CV LAB;  Service: Cardiovascular;  Laterality: N/A;   SAVORY DILATION N/A 06/10/2015   Procedure: SAVORY DILATION;  Surgeon: Cassie Click, MD;  Location: [facility] ENDOSCOPY;  Service: Endoscopy;  Laterality: N/A;   TONSILLECTOMY      Home Medications:  Allergies as of 12/14/2023       Reactions   Prednisone     "they think threw me into A-fib"   Terazosin Other (See Comments)   Muscle Pain        Medication List        Accurate as of Dec 11, 2023  7:46 AM. If you have any questions, ask your nurse or doctor.          amiodarone  100 MG tablet Commonly known as: PACERONE  Take 1 tablet (100 mg total) by mouth daily.   atorvastatin  40 MG tablet Commonly known as: LIPITOR TAKE 1 TABLET BY MOUTH EVERY MORNING   diltiazem  30 MG tablet Commonly known as: CARDIZEM  TAKE ONE TABLET EVERY FOUR HOURS AS NEEDED FOR AFIB HEART RATE >100   EasiVent inhaler Please instruct patient on use   ferrous sulfate 325 (65 FE) MG tablet Take 325 mg by mouth daily with breakfast.   finasteride  5 MG tablet Commonly known as: PROSCAR  Take 5 mg by mouth daily.   fluticasone  50 MCG/ACT nasal spray Commonly known as: FLONASE  Place 1 spray into both nostrils daily.   ipratropium 0.03 % nasal spray Commonly known as: ATROVENT 1 spray as needed.   levalbuterol  45 MCG/ACT inhaler Commonly known as: XOPENEX  HFA Inhale 2 inhalations into the lungs every 6 (six) hours as needed for Wheezing or Shortness of Breath   LUBRICATING EYE DROPS OP Place 1 drop into both eyes daily as needed (dry eyes).   methocarbamol 500 MG tablet Commonly known as: ROBAXIN Take 500 mg by mouth 2 (two) times daily.   metoprolol  succinate 25 MG 24 hr tablet Commonly known as: TOPROL -XL Take 25 mg by mouth 2 (two) times daily at 10 AM and 5 PM.   multivitamin tablet Take 1 tablet by mouth daily.   nitroGLYCERIN  0.4 MG SL tablet Commonly known as: NITROSTAT  Place 1 nitroglycerin  under  your tongue, while sitting. If no relief of pain, may repeat 1 tablet every 5 minutes up to 3 tablets over 15 minutes.   pantoprazole  40 MG tablet Commonly known as: PROTONIX  TAKE 1 TABLET BY MOUTH AT BEDTIME   PreserVision AREDS 2 Caps Take 1 capsule by mouth 2 (two) times daily.   promethazine-dextromethorphan 6.25-15 MG/5ML syrup Commonly known as: PROMETHAZINE-DM Take 5 mLs by mouth every 6 (six) hours as needed for Cough Maximum 30 mL / 24-hour   Rivaroxaban  15 MG Tabs tablet Commonly known as: XARELTO  Take 1 tablet (15 mg total) by mouth daily with supper.   tamsulosin  0.4 MG Caps capsule Commonly known as: FLOMAX  Take 1 capsule (0.4 mg total) by mouth  daily.   VITAMIN D3 PO Take 1 capsule by mouth daily.        Allergies:  Allergies  Allergen Reactions   Prednisone      "they think threw me into A-fib"   Terazosin Other (See Comments)    Muscle Pain    Family History: Family History  Problem Relation Age of Onset   Stroke Mother    Heart attack Mother    Hypertension Mother    Stroke Other        family history    Social History:  reports that he has never smoked. He has never used smokeless tobacco. He reports current alcohol use of about 1.0 standard drink of alcohol per week. He reports that he does not use drugs.  ROS: Pertinent ROS in HPI  Physical Exam: There were no vitals taken for this visit.  Constitutional:  Well nourished. Alert and oriented, No acute distress. HEENT: [place] AT, moist mucus membranes.  Trachea midline, no masses. Cardiovascular: No clubbing, cyanosis, or edema. Respiratory: Normal respiratory effort, no increased work of breathing. GI: Abdomen is soft, non tender, non distended, no abdominal masses. Liver and spleen not palpable.  No hernias appreciated.  Stool sample for occult testing is not indicated.   GU: No CVA tenderness.  No bladder fullness or masses.  Patient with circumcised/uncircumcised phallus. ***Foreskin easily  retracted***  Urethral meatus is patent.  No penile discharge. No penile lesions or rashes. Scrotum without lesions, cysts, rashes and/or edema.  Testicles are located scrotally bilaterally. No masses are appreciated in the testicles. Left and right epididymis are normal. Rectal: Patient with  normal sphincter tone. Anus and perineum without scarring or rashes. No rectal masses are appreciated. Prostate is approximately *** grams, *** nodules are appreciated. Seminal vesicles are normal. Skin: No rashes, bruises or suspicious lesions. Lymph: No cervical or inguinal adenopathy. Neurologic: Grossly intact, no focal deficits, moving all 4 extremities. Psychiatric: Normal mood and affect.   Laboratory Data: CBC w/auto Differential (5 Part) Order: [id number] Component Ref Range & Units 1 mo ago  WBC (White Blood Cell Count) 4.1 - 10.2 10^3/uL 7.5  RBC (Red Blood Cell Count) 4.69 - 6.13 10^6/uL 3.19 Low   Hemoglobin 14.1 - 18.1 gm/dL 40.9 Low   Hematocrit 81.1 - 52.0 % 31.8 Low   MCV (Mean Corpuscular Volume) 80.0 - 100.0 fl 99.7  MCH (Mean Corpuscular Hemoglobin) 27.0 - 31.2 pg 31.7 High   MCHC (Mean Corpuscular Hemoglobin Concentration) 32.0 - 36.0 gm/dL 91.4 Low   Platelet Count 150 - 450 10^3/uL 357  RDW-CV (Red Cell Distribution Width) 11.6 - 14.8 % 13.5  MPV (Mean Platelet Volume) 9.4 - 12.4 fl 9.9  Neutrophils 1.50 - 7.80 10^3/uL 5.33  Lymphocytes 1.00 - 3.60 10^3/uL 0.86 Low   Monocytes 0.00 - 1.50 10^3/uL 0.99  Eosinophils 0.00 - 0.55 10^3/uL 0.21  Basophils 0.00 - 0.09 10^3/uL 0.09  Neutrophil % 32.0 - 70.0 % 70.9 High   Lymphocyte % 10.0 - 50.0 % 11.4  Monocyte % 4.0 - 13.0 % 13.2 High   Eosinophil % 1.0 - 5.0 % 2.8  Basophil% 0.0 - 2.0 % 1.2  Immature Granulocyte % <=0.7 % 0.5  Immature Granulocyte Count <=0.06 10^3/L 0.04  Resulting Agency [facility]   Specimen Collected: 11/09/23 14:49   Performed by: [facility] Last  Resulted: 11/10/23 13:04  Received From: [facility]  Result Received: 11/24/23 09:02   Comprehensive Metabolic Panel (CMP)  Order: [id number] Component Ref Range & Units 1 mo ago  Glucose 70 - 110 mg/dL 95  Sodium 086 - 578 mmol/L 140  Potassium 3.6 - 5.1 mmol/L 4.4  Chloride 97 - 109 mmol/L 105  Carbon Dioxide (CO2) 22.0 - 32.0 mmol/L 27.3  Urea  Nitrogen (BUN) 7 - 25 mg/dL 17  Creatinine 0.7 - 1.3 mg/dL 1.5 High   Glomerular Filtration Rate (eGFR) >60 mL/min/1.73sq m 44 Low   Comment: CKD-EPI (2021) does not include patient's race in the calculation of eGFR.  Monitoring changes of plasma creatinine and eGFR over time is useful for monitoring kidney function.  Interpretive Ranges for eGFR (CKD-EPI 2021):  eGFR:       >60 mL/min/1.73 sq. m - Normal eGFR:       30-59 mL/min/1.73 sq. m - Moderately Decreased eGFR:       15-29 mL/min/1.73 sq. m  - Severely Decreased eGFR:       < 15 mL/min/1.73 sq. m  - Kidney Failure   Note: These eGFR calculations do not apply in acute situations when eGFR is changing rapidly or patients on dialysis.  Calcium  8.7 - 10.3 mg/dL 9.3  AST 8 - 39 U/L 20  ALT 6 - 57 U/L 15  Alk Phos (alkaline Phosphatase) 34 - 104 U/L 85  Albumin 3.5 - 4.8 g/dL 4.3  Bilirubin, Total 0.3 - 1.2 mg/dL 0.4  Protein, Total 6.1 - 7.9 g/dL 6.1  A/G Ratio 1.0 - 5.0 gm/dL 2.4  Resulting Agency [facility]   Specimen Collected: 11/09/23 14:49   Performed by: [facility] Resulted: 11/10/23 15:03  Received From: [facility]  Result Received: 11/24/23 09:02   Lipid Panel w/calc LDL Order: [id number] Component Ref Range & Units 1 mo ago  Cholesterol, Total 100 - 200 mg/dL 413  Triglyceride 35 - 199 mg/dL 244  HDL (High Density Lipoprotein) Cholesterol 29.0 - 71.0 mg/dL 01.0  LDL Calculated 0 - 130 mg/dL 71  VLDL Cholesterol mg/dL 28  Cholesterol/HDL Ratio 3.3  Resulting Agency  [facility]   Specimen Collected: 11/09/23 14:49   Performed by: [facility] Resulted: 11/10/23 15:03  Received From: [facility]  Result Received: 11/24/23 09:02   Thyroid  Stimulating-Hormone (TSH) Order: [id number] Component Ref Range & Units 1 mo ago  Thyroid  Stimulating Hormone (TSH) 0.450-5.330 uIU/ml uIU/mL 5.437 High   Resulting Agency [facility] - LAB   Specimen Collected: 11/09/23 14:49   Performed by: [facility] Resulted: 11/10/23 15:03  Received From: [facility]  Result Received: 11/24/23 09:02   Urinalysis See EPIC and HPI I have reviewed the labs.  See HPI.     Pertinent Imaging: ***   Assessment & Plan:    1. Phimosis -s/p dorsal slit  2. BPH with LUTS -   3. ED -   No follow-ups on file.  These notes generated with voice recognition software. I apologize for typographical errors.  Briant Camper  [facility] [address]  [address] [place], [place] [address] [phone]

## 2023-12-14 ENCOUNTER — Encounter: Payer: Self-pay | Admitting: Urology

## 2023-12-14 ENCOUNTER — Ambulatory Visit (INDEPENDENT_AMBULATORY_CARE_PROVIDER_SITE_OTHER): Admitting: Urology

## 2023-12-14 ENCOUNTER — Telehealth: Payer: Self-pay | Admitting: Cardiology

## 2023-12-14 ENCOUNTER — Telehealth: Payer: Self-pay

## 2023-12-14 ENCOUNTER — Other Ambulatory Visit: Payer: Self-pay | Admitting: Emergency Medicine

## 2023-12-14 VITALS — BP 147/73 | HR 87 | Ht 70.0 in | Wt 205.0 lb

## 2023-12-14 DIAGNOSIS — N529 Male erectile dysfunction, unspecified: Secondary | ICD-10-CM

## 2023-12-14 DIAGNOSIS — N471 Phimosis: Secondary | ICD-10-CM | POA: Diagnosis not present

## 2023-12-14 DIAGNOSIS — N401 Enlarged prostate with lower urinary tract symptoms: Secondary | ICD-10-CM

## 2023-12-14 DIAGNOSIS — R350 Frequency of micturition: Secondary | ICD-10-CM | POA: Diagnosis not present

## 2023-12-14 DIAGNOSIS — N476 Balanoposthitis: Secondary | ICD-10-CM | POA: Diagnosis not present

## 2023-12-14 DIAGNOSIS — N481 Balanitis: Secondary | ICD-10-CM

## 2023-12-14 LAB — MICROSCOPIC EXAMINATION: WBC, UA: >30 /HPF — AB (ref 0–5)

## 2023-12-14 LAB — BLADDER SCAN AMB NON-IMAGING

## 2023-12-14 LAB — ECHOCARDIOGRAM COMPLETE
AR max vel: 3.34 cm2
AV Area VTI: 3.29 cm2
AV Area mean vel: 3.27 cm2
AV Mean grad: 4 mmHg
AV Peak grad: 8.3 mmHg
Ao pk vel: 1.44 m/s
Area-P 1/2: 2.5 cm2
S' Lateral: 2.55 cm

## 2023-12-14 LAB — URINALYSIS, COMPLETE
Bilirubin, UA: NEGATIVE
Glucose, UA: NEGATIVE
Nitrite, UA: NEGATIVE
RBC, UA: NEGATIVE
Specific Gravity, UA: 1.025 (ref 1.005–1.030)
Urobilinogen, Ur: 0.2 mg/dL (ref 0.2–1.0)
pH, UA: 6 (ref 5.0–7.5)

## 2023-12-14 MED ORDER — NYSTATIN 100000 UNIT/GM EX CREA
1.0000 | TOPICAL_CREAM | Freq: Two times a day (BID) | CUTANEOUS | 0 refills | Status: DC
Start: 2023-12-14 — End: 2024-01-07

## 2023-12-14 MED ORDER — BETAMETHASONE DIPROPIONATE 0.05 % EX CREA: TOPICAL_CREAM | Freq: Two times a day (BID) | CUTANEOUS | 0 refills | Status: DC

## 2023-12-14 NOTE — Telephone Encounter (Signed)
 Patient stated he was told to drop off medication list. Placed in nurse's box.

## 2023-12-14 NOTE — Telephone Encounter (Signed)
-----   Message from [facility] sent at 12/14/2023 10:55 AM EDT ----- Regarding: PTNS Will you see if his insurance will cover PTNS?

## 2023-12-14 NOTE — Patient Instructions (Signed)

## 2023-12-14 NOTE — Telephone Encounter (Signed)
 Pt medication list used to update med list in EMR

## 2023-12-15 ENCOUNTER — Ambulatory Visit: Payer: Self-pay | Admitting: Cardiovascular Disease

## 2023-12-16 NOTE — Addendum Note (Signed)
 Addended by: Lott Rouleau A on: 12/16/2023 11:16 AM   Modules accepted: Orders

## 2023-12-16 NOTE — Progress Notes (Signed)
 Remote pacemaker transmission.

## 2023-12-17 NOTE — Telephone Encounter (Signed)
 Called patient, he states that he is still having some dizziness, had some this morning when he woke up- he states his blood pressure has been running 130/62 HR 70- this was this morning, but states this has been his normal for the last few days.   He state she DOES NOT take the Metoprolol , but does take the Losartan - he states the Metoprolol  was taken off his medication list, and he does not take that anymore as recommended.   Advised I would call back with any recommendations.

## 2023-12-17 NOTE — Telephone Encounter (Signed)
Called patient, advised of message below. Patient verbalized understanding.  

## 2023-12-20 NOTE — Telephone Encounter (Signed)
 NO PA is required. Co pay will be $20 each week for 12 weeks. Left message for patient to call back to discuss and schedule if desires to proceed.

## 2023-12-21 NOTE — Telephone Encounter (Signed)
 Pt LM on triage line returning you call. Requests call back.

## 2023-12-23 ENCOUNTER — Telehealth: Payer: Self-pay | Admitting: Cardiovascular Disease

## 2023-12-23 NOTE — Telephone Encounter (Signed)
 Spoke with patient, patient states he has pacemaker and does not think he can have PTNS done if he has this. Spoke with Matilde Son and she stated we would need to get clearance from Dr Alvenia Aus and usually it is still ok to proceed. Patient is ok with checking with cardiologist and is ok with his co pay for PTNS. Cardiac clearance faxed over today.

## 2023-12-23 NOTE — Telephone Encounter (Signed)
   Pre-operative Risk Assessment    Patient Name: Benjamin Brewer  DOB: [birth year] MRN: [id number]   Date of last office visit: 10/25/2023 Date of next office visit: 02/23/2024   Request for Surgical Clearance    Procedure:  Percutaneous Tibial Nerve Stimulation  Date of Surgery:  Clearance TBD                                Surgeon:  Not indicated Surgeon's Group or Practice Name:  [facility] Phone number:  [phone] Fax number:  [phone]   Type of Clearance Requested:   - Medical    Type of Anesthesia:  Not Indicated   Additional requests/questions:    SignedGenny Kid Schools   12/23/2023, 11:48 AM

## 2023-12-23 NOTE — Telephone Encounter (Signed)
   Primary Cardiologist: Antionette Kirks, MD  Chart reviewed as part of pre-operative protocol coverage. Given past medical history and time since last visit, based on ACC/AHA guidelines, Benjamin Brewer would be at acceptable risk for the planned low risk percutaneous procedure without further cardiovascular testing.   I will route this recommendation to the requesting party via Epic fax function and remove from pre-op pool.  Please call with questions.  Gerldine Koch, NP-C  12/23/2023, 2:53 PM [address] [place], [place] [address] Office [phone] Fax [phone]

## 2023-12-27 NOTE — Telephone Encounter (Signed)
 Per cardiology office notes it is ok to proceed with PTNS, left detailed message for the patient to call back or send mychart message letting me know if there are certain days or times that do not work for the patient and reminded him of the co pay of $20 for each visit x 12 weeks.

## 2024-01-07 ENCOUNTER — Ambulatory Visit: Admitting: Urology

## 2024-01-07 VITALS — BP 113/64 | HR 76 | Ht 70.0 in | Wt 200.0 lb

## 2024-01-07 DIAGNOSIS — N481 Balanitis: Secondary | ICD-10-CM | POA: Diagnosis not present

## 2024-01-07 DIAGNOSIS — L820 Inflamed seborrheic keratosis: Secondary | ICD-10-CM | POA: Insufficient documentation

## 2024-01-07 DIAGNOSIS — N471 Phimosis: Secondary | ICD-10-CM

## 2024-01-07 MED ORDER — BETAMETHASONE DIPROPIONATE 0.05 % EX CREA: TOPICAL_CREAM | Freq: Two times a day (BID) | CUTANEOUS | 3 refills | Status: AC

## 2024-01-07 MED ORDER — NYSTATIN 100000 UNIT/GM EX CREA: 1.0000 | TOPICAL_CREAM | Freq: Two times a day (BID) | CUTANEOUS | 3 refills | Status: AC

## 2024-01-10 DIAGNOSIS — N183 Chronic kidney disease, stage 3 unspecified: Secondary | ICD-10-CM | POA: Diagnosis not present

## 2024-01-10 DIAGNOSIS — D631 Anemia in chronic kidney disease: Secondary | ICD-10-CM | POA: Diagnosis not present

## 2024-01-10 DIAGNOSIS — N1832 Chronic kidney disease, stage 3b: Secondary | ICD-10-CM | POA: Diagnosis not present

## 2024-01-13 ENCOUNTER — Encounter: Payer: Self-pay | Admitting: Urology

## 2024-01-13 NOTE — Patient Instructions (Addendum)
 SABRA

## 2024-01-13 NOTE — Progress Notes (Signed)
 01/07/2024 3:16 PM   Benjamin Brewer 01-23-1931 [id number]  Referring provider: Diedra Lame, MD [phone] [address] [facility] [place],  [place] [address]  Urological history: 1. BPH - PSA (05/2020) 0.86 - finasteride  5 mg daily   2. Phimosis - dorsal slit (11/2022)  Chief Complaint  Patient presents with   Follow-up   HPI: Benjamin Brewer is a 89 y.o. male who presents today for 2 week follow up after being prescribed Diprolene  cream twice daily for phimosis and nystatin  cream for balanoposthitis.    Previous records reviewed.   At his visit on 12/14/2023, he was having a difficult time pulling the foreskin back over the glans and causing him to have spraying of urine.    He had also been having over a years worth of daytime and nighttime frequency.  He had been on tamsulosin , but he discontinued that 2 months ago.  He states his symptoms improved a little bit when he stopped the tamsulosin .   He denies a history of any recent UTIs.  He was not having gross hematuria, dysuria or suprapubic/flank pain.  He was not having any fevers, chills, nausea or vomiting.   I PSS 24/5.   PVR 3 mL.  UA was yellow clear, specific gravity 1.025, pH 6.0, trace protein, trace ketone, 2+ leukocyte, greater than 30 WBCs, 0-2 RBCs, 0-10 epithelial cells, hyaline casts present, mucus threads present and moderate bacteria.   I prescribed Diprolene  cream to help soften his foreskin.  I also prescribed nystatin  for the balanoposthitis.  We also discussed PTNS therapy to address his lower urinary tract symptoms as his insurance would not cover Myrbetriq or Gemtesa and due to his age and already having memory issues he was not a good candidate for anticholinergics.  He was interested in PTNS therapy and we went ahead and inquired about insurance coverage for him.  He states that the Diprolene  cream has worked well for the foreskin as it is softer now and he is able to  get it past his glans for voiding.   He still has his issues of frequency, urgency, and urge incontinence.   He is scheduled to start PTNS on July 15.  Patient denies any modifying or aggravating factors.  Patient denies any recent UTI's, gross hematuria, dysuria or suprapubic/flank pain.  Patient denies any fevers, chills, nausea or vomiting.    Serum creatinine (12/2023) 1.3, e GFR 52   PMH: Past Medical History:  Diagnosis Date   A-fib (HCC)    a.) CHA2DS2VASc = 4 (age x 2, HTN, vascular disease history); b.) rate/rhythm maintained on oral amiodarone  + diltiazem  + metoprolol  succinate; chronically anticoagulated with apixaban    Anemia    Angina pectoris (HCC)    Aortic atherosclerosis (HCC)    Aortic root dilatation (HCC) 05/09/2020   a.) TTE 05/09/2020: 38 mm; b.) TTE 02/04/2022: 42 mm   Arthritis of both hands    Benign prostatic hypertrophy    CAD (coronary artery disease)    a.) LHC 12/05/2002: 90% prox diagonal, 30% pLCx, 30% mLCx, 20% pOM1, 40% pRCA, 30% mRCA - med mgmt; b.) LHC/PCI 05/15/2008: 99% pRCA (2.75 x 15 mm Promus DES), 80% mLAD (3.5 x 18 mm Promus DES)   CKD (chronic kidney disease), stage III (HCC)    Dental root implant present    Diastolic dysfunction    a.) TTE 09/04/2014: EF 55-60%, RVE, LAE, mild MR, G1DD; b.) TTE 01/15/2018: EF 60-65%,  LAE, AoV sclerosis, triv MR/TR/PR, G1DD; c.) TTE 05/09/2020: EF 55-60%, LAD, mild-mod AoV sclerosis, G1DD; d.) TTE 04/25/2021: EF 60-65%, LVH, mild-mod MR, G1DD; e.) TTE 02/04/2022: EF 60-65%, mod LVH, LAE, mod RAE, AoV sclerosis, triv MR, G1DD   DOE (dyspnea on exertion)    Dyslipidemia    GERD (gastroesophageal reflux disease)    History of 2019 novel coronavirus disease (COVID-19) 11/2020   Hyperlipidemia    Hypertension    Long term current use of amiodarone     NSVT (nonsustained ventricular tachycardia) (HCC) 02/12/2021   a.) holter 02/12/2021: 17 beat run at a maximum rate of 154 bpm   Numbness and tingling of both lower  extremities    Phimosis of penis    Pneumonia    Presence of permanent cardiac pacemaker 05/05/2021   a.) s/p dual-chamber PPM 05/05/2021; MDT Azure XT DR MRI SureScan (SN: [id number])   PSVT (paroxysmal supraventricular tachycardia) (HCC) 01/12/2018   a.) holter 01/12/2018: PSVT runs --> longest lasting 14 beats at max rate of 142 bpm; b.) holter 02/12/2021: PSVT x 88 runs  --> longest lasting 5 beats at a maximum rat of 182 bpm   Tachy-brady syndrome (HCC)    a.) s/p PPM placement 05/05/2021   Vertigo     Surgical History: Past Surgical History:  Procedure Laterality Date   CARDIAC CATHETERIZATION     [facility]   CATARACT EXTRACTION W/PHACO Left 07/03/2019   Procedure: CATARACT EXTRACTION PHACO AND INTRAOCULAR LENS PLACEMENT (IOC) LEFT 10.19  01:06.9;  Surgeon: Myrna Adine Anes, MD;  Location: [facility];  Service: Ophthalmology;  Laterality: Left;   COLONOSCOPY     CORONARY ANGIOPLASTY WITH STENT PLACEMENT Left 05/15/2008   Procedure: CORONARY ANGIOPLASTY WITH STENT PLACEMENT; Location: [facility]; Surgeon: Lonni Ditch, MD   DORSAL SLIT N/A 11/30/2022   Procedure: DORSAL SLIT;  Surgeon: Penne Knee, MD;  Location: [facility];  Service: Urology;  Laterality: N/A;   ESOPHAGOGASTRODUODENOSCOPY (EGD) WITH PROPOFOL  N/A 06/10/2015   Procedure: ESOPHAGOGASTRODUODENOSCOPY (EGD) WITH PROPOFOL ;  Surgeon: Lamar ONEIDA Holmes, MD;  Location[facility] END[facility];  Service: Endoscopy;  Laterality: N/A;   HERNIA REPAIR     LEFT HEART CATH AND CORONARY ANGIOGRAPHY Left 12/05/2002   Procedure: CORONARY ANGIOPLASTY WITH STENT PLACEMENT; Location: [facility]; Surgeon: Benjamin Guan, MD   PACEMAKER IMPLANT N/A 05/05/2021   Procedure: PACEMAKER IMPLANT;  Surgeon: Cindie Ole ONEIDA, MD;  Location: [facility] INVASIVE CV LAB;  Service: Cardiovascular;  Laterality: N/A;   SAVORY DILATION N/A 06/10/2015   Procedure: SAVORY DILATION;  Surgeon: Lamar ONEIDA Holmes, MD;  Location: [facility] ENDOSCOPY;   Service: Endoscopy;  Laterality: N/A;   TONSILLECTOMY      Home Medications:  Allergies as of 01/07/2024       Reactions   Prednisone     they think threw me into A-fib   Terazosin Other (See Comments)   Muscle Pain        Medication List        Accurate as of January 07, 2024 11:59 PM. If you have any questions, ask your nurse or doctor.          STOP taking these medications    ipratropium 0.03 % nasal spray Commonly known as: ATROVENT   tamsulosin  0.4 MG Caps capsule Commonly known as: FLOMAX        TAKE these medications    amiodarone  100 MG tablet Commonly known as: PACERONE  Take 1 tablet (100 mg total) by mouth daily.   amoxicillin 250 MG capsule Commonly  known as: AMOXIL Take 250 mg by mouth 2 (two) times daily.   atorvastatin  40 MG tablet Commonly known as: LIPITOR TAKE 1 TABLET BY MOUTH EVERY MORNING   betamethasone  dipropionate 0.05 % cream Apply topically 2 (two) times daily.   finasteride  5 MG tablet Commonly known as: PROSCAR  Take 5 mg by mouth daily.   losartan  25 MG tablet Commonly known as: COZAAR  Take 25 mg by mouth daily.   LUBRICATING EYE DROPS OP Place 1 drop into both eyes daily as needed (dry eyes).   metoprolol  succinate 25 MG 24 hr tablet Commonly known as: TOPROL -XL Take 25 mg by mouth 2 (two) times daily at 10 AM and 5 PM.   multivitamin tablet Take 1 tablet by mouth daily.   nitroGLYCERIN  0.4 MG SL tablet Commonly known as: NITROSTAT  Place 1 nitroglycerin  under your tongue, while sitting. If no relief of pain, may repeat 1 tablet every 5 minutes up to 3 tablets over 15 minutes.   nystatin  cream Commonly known as: MYCOSTATIN  Apply 1 Application topically 2 (two) times daily.   pantoprazole  40 MG tablet Commonly known as: PROTONIX  TAKE 1 TABLET BY MOUTH AT BEDTIME   Rivaroxaban  15 MG Tabs tablet Commonly known as: XARELTO  Take 1 tablet (15 mg total) by mouth daily with supper.   VITAMIN D3 PO Take 1 capsule  by mouth daily.        Allergies:  Allergies  Allergen Reactions   Prednisone      they think threw me into A-fib   Terazosin Other (See Comments)    Muscle Pain    Family History: Family History  Problem Relation Age of Onset   Stroke Mother    Heart attack Mother    Hypertension Mother    Stroke Other        family history    Social History:  reports that he has never smoked. He has never used smokeless tobacco. He reports current alcohol use of about 1.0 standard drink of alcohol per week. He reports that he does not use drugs.  ROS: Pertinent ROS in HPI  Physical Exam: BP 113/64   Pulse 76   Ht 5' 10 (1.778 m)   Wt 200 lb (90.7 kg)   BMI 28.70 kg/m   Constitutional:  Well nourished. Alert and oriented, No acute distress. HEENT: [place] AT, moist mucus membranes.  Trachea midline Cardiovascular: No clubbing, cyanosis, or edema. Respiratory: Normal respiratory effort, no increased work of breathing. GU: No CVA tenderness.  No bladder fullness or masses.  Patient with uncircumcised phallus.   Dorsal slit is well healed.  Foreskin easily retracted.  The band of tissue that we appreciated on his exam at his last visit has now diminished.  Urethral meatus is patent.  No penile discharge.  No penile lesions or rashes.   Neurologic: Grossly intact, no focal deficits, moving all 4 extremities. Psychiatric: Normal mood and affect. Constitutional:  Well nourished. Alert and oriented, No acute distress.  Laboratory Data: See EPIC and HPI I have reviewed the labs.  See HPI.     Pertinent Imaging: N/A   Assessment & Plan:    1. Phimosis - s/p dorsal slit - Foreskin responding well to Diprolene  cream to apply twice daily to help soften up the foreskin - Continue Diprolene  cream twice daily  2. Balanoposthitis  - Resolved -Continue nystatin  cream twice daily  2. BPH with LUTS - Return as scheduled for PTNS therapy  Return for return for PTNS .  These  notes  generated with voice recognition software. I apologize for typographical errors.  Benjamin Brewer  [facility] [address]  [address] [place], [place] [address] [phone]

## 2024-01-17 DIAGNOSIS — E7800 Pure hypercholesterolemia, unspecified: Secondary | ICD-10-CM | POA: Diagnosis not present

## 2024-01-17 DIAGNOSIS — I480 Paroxysmal atrial fibrillation: Secondary | ICD-10-CM | POA: Diagnosis not present

## 2024-01-17 DIAGNOSIS — D631 Anemia in chronic kidney disease: Secondary | ICD-10-CM | POA: Diagnosis not present

## 2024-01-17 DIAGNOSIS — I10 Essential (primary) hypertension: Secondary | ICD-10-CM | POA: Diagnosis not present

## 2024-01-17 DIAGNOSIS — N183 Chronic kidney disease, stage 3 unspecified: Secondary | ICD-10-CM | POA: Diagnosis not present

## 2024-01-17 DIAGNOSIS — R7989 Other specified abnormal findings of blood chemistry: Secondary | ICD-10-CM | POA: Diagnosis not present

## 2024-01-17 DIAGNOSIS — Z79899 Other long term (current) drug therapy: Secondary | ICD-10-CM | POA: Diagnosis not present

## 2024-01-17 NOTE — Progress Notes (Signed)
 Benjamin Brewer Blush SrSABRA Peter is a 89 y.o. male who is here for a follow up visit  CKD: His Cr was 1.3 on recent labs.  HTN: His blood pressure has been labile. He notes that it has been low at times and high at times. He notes that he has been trying to cut back on water consumption due to urinary frequency but then he feels lightheaded.  Afib: He is taking amiodarone  for rhythm control and he is taking xarelto  for stroke prevention.  Hyperlipidemia: He is taking atorvastatin  and he tolerates this well. His LDL was at goal when last checked.   Anemia: His hemoglobin was 10.3 on recent labs which is similar to previous.  Patient Active Problem List  Diagnosis  . Coronary artery disease involving native coronary artery without angina pectoris  . Pure hypercholesterolemia  . Essential hypertension, benign  . GERD without esophagitis  . Benign prostatic hyperplasia with lower urinary tract symptoms, unspecified morphology  . CKD (chronic kidney disease) stage 3, GFR 30-59 ml/min (CMS/HHS-HCC)  . Paroxysmal A-fib (CMS/HHS-HCC)  . Acute renal failure superimposed on stage 3 chronic kidney disease (CMS/HHS-HCC)  . BPPV (benign paroxysmal positional vertigo)  . Coronary atherosclerosis of native coronary artery  . Hyperlipidemia  . Personal history of other specified diseases(V13.89)    Past Medical History:  Diagnosis Date  . BPH (benign prostatic hypertrophy)    With prostatism, negative biopsy in 1993.   . Bright red blood per rectum    Flex sig in November 1995 showed irritated internal hemorrhoids, questionable tear only.  Air contrast barium enema in December 1995 was normal.  . CAD (coronary atherosclerotic disease) June 1999   Manifested by abnormal stress echo.  Cardiac cath showed normal LV function, normal left main, 60% mid-LAD, 80% first AL lesion.    . Hemangioma of liver February 1997   By CT and ultrasound.  . Hematospermia    With negative Urology workup.  SABRA History of  duodenal ulcer 06/2008   With recurrent anemia, s/p transfusion.  . Hyperlipidemia   . Normocytic normochromic anemia    Resolved.  . Tachyarrhythmia, unspecified    Unclear type.  Cardiac cath x2 to include 1990 and 1993 showed insignificant disease.       Current Outpatient Medications:  .  albuterol 90 mcg/actuation inhaler, Inhale 2 inhalations into the lungs every 4 (four) hours as needed for Wheezing, Disp: 1 each, Rfl: 0 .  AMIOdarone  (PACERONE ) 200 MG tablet, Take 1 tablet by mouth once daily, Disp: , Rfl:  .  atorvastatin  (LIPITOR) 40 MG tablet, Take 40 mg by mouth once daily., Disp: , Rfl:  .  cholecalciferol (VITAMIN D3) 400 unit tablet, Take 400 Units by mouth once daily, Disp: , Rfl:  .  ferrous sulfate 325 (65 FE) MG EC tablet, Take 325 mg by mouth daily with breakfast, Disp: , Rfl:  .  finasteride  (PROSCAR ) 5 mg tablet, TAKE 1 TABLET BY MOUTH DAILY, Disp: 30 tablet, Rfl: 0 .  fluticasone  propionate (FLONASE ) 50 mcg/actuation nasal spray, Place 2 sprays into both nostrils once daily, Disp: 16 g, Rfl: 0 .  losartan  (COZAAR ) 25 MG tablet, Take 25 mg by mouth once daily, Disp: , Rfl:  .  pantoprazole  (PROTONIX ) 40 MG DR tablet, Take 40 mg by mouth once daily., Disp: , Rfl:  .  rivaroxaban  (XARELTO ) 15 mg tablet, Take 15 mg by mouth, Disp: , Rfl:  .  nitroGLYcerin  (NITROSTAT ) 0.4 MG SL tablet, PLACE 1 TABLET  UNDER TONGUE EVERY 5 MIN AS NEEDED FOR CHEST PAIN IF NO RELIEF IN15 MIN CALL 911 (MAX 3 TABS), Disp: 25 tablet, Rfl: 0  Allergies  Allergen Reactions  . Prednisone  Other (See Comments)    Per patient it caused A-Fib.   . Terazosin Muscle Pain and Other (See Comments)    Muscle Pain  . Other Other (See Comments)    Prostate medication, pt unsure of name    Results for orders placed or performed in visit on 01/10/24  CBC w/auto Differential (5 Part)  Result Value Ref Range   WBC (White Blood Cell Count) 5.8 4.1 - 10.2 10^3/uL   RBC (Red Blood Cell Count) 3.24 (L) 4.69  - 6.13 10^6/uL   Hemoglobin 10.3 (L) 14.1 - 18.1 gm/dL   Hematocrit 68.3 (L) 59.9 - 52.0 %   MCV (Mean Corpuscular Volume) 97.5 80.0 - 100.0 fl   MCH (Mean Corpuscular Hemoglobin) 31.8 (H) 27.0 - 31.2 pg   MCHC (Mean Corpuscular Hemoglobin Concentration) 32.6 32.0 - 36.0 gm/dL   Platelet Count 699 849 - 450 10^3/uL   RDW-CV (Red Cell Distribution Width) 14.3 11.6 - 14.8 %   MPV (Mean Platelet Volume) 9.7 9.4 - 12.4 fl   Neutrophils 4.10 1.50 - 7.80 10^3/uL   Lymphocytes 0.62 (L) 1.00 - 3.60 10^3/uL   Monocytes 0.73 0.00 - 1.50 10^3/uL   Eosinophils 0.28 0.00 - 0.55 10^3/uL   Basophils 0.07 0.00 - 0.09 10^3/uL   Neutrophil % 70.5 (H) 32.0 - 70.0 %   Lymphocyte % 10.7 10.0 - 50.0 %   Monocyte % 12.6 4.0 - 13.0 %   Eosinophil % 4.8 1.0 - 5.0 %   Basophil% 1.2 0.0 - 2.0 %   Immature Granulocyte % 0.2 <=0.7 %   Immature Granulocyte Count 0.01 <=0.06 10^3/L  Comprehensive Metabolic Panel (CMP)  Result Value Ref Range   Glucose 84 70 - 110 mg/dL   Sodium 859 863 - 854 mmol/L   Potassium 4.3 3.6 - 5.1 mmol/L   Chloride 106 97 - 109 mmol/L   Carbon Dioxide (CO2) 27.2 22.0 - 32.0 mmol/L   Urea  Nitrogen (BUN) 15 7 - 25 mg/dL   Creatinine 1.3 0.7 - 1.3 mg/dL   Glomerular Filtration Rate (eGFR) 52 (L) >60 mL/min/1.73sq m   Calcium  9.3 8.7 - 10.3 mg/dL   AST  20 8 - 39 U/L   ALT  13 6 - 57 U/L   Alk Phos (alkaline Phosphatase) 89 34 - 104 U/L   Albumin 4.2 3.5 - 4.8 g/dL   Bilirubin, Total 0.4 0.3 - 1.2 mg/dL   Protein, Total 5.9 (L) 6.1 - 7.9 g/dL   A/G Ratio 2.5 1.0 - 5.0 gm/dL     Review of Systems  No fever, chills, nausea, or vomiting   Exam  Vitals:   01/17/24 1031  BP: (!) 140/76  BP Location: Left upper arm  Patient Position: Sitting  BP Cuff Size: Adult  Pulse: 83  SpO2: 97%  Weight: 91.8 kg (202 lb 6.4 oz)  Height: 177.8 cm (5' 10)    Body mass index is 29.04 kg/m.  General: Patient is well-groomed, well-nourished, appears stated age in no acute  distress  HEENT: head is atraumatic and normocephalic, neck is supple with no palpable nodules  CV: Regular rhythm and normal heart rate, no murmur, no JVD  Respiratory: Clear to auscultation throughout lung fields with no wheezing, crackles, or rhonchi. No increased work of breathing   Impression/Plan  CKD:  His Cr is stable. He is taking losartan  for kidney protection.  Anemia: His hemoglobin is stable.  HTN: His blood pressure has been labile per his report but on average has been reasonably well controlled.  Afib: He is rhythm controlled with amiodarone  and he is taking xarelto  for stroke prevention.  This visit was part of longitudinal care for this patient involving long-term management of chronic medical conditions.

## 2024-01-20 DIAGNOSIS — H3562 Retinal hemorrhage, left eye: Secondary | ICD-10-CM | POA: Diagnosis not present

## 2024-01-20 DIAGNOSIS — H353134 Nonexudative age-related macular degeneration, bilateral, advanced atrophic with subfoveal involvement: Secondary | ICD-10-CM | POA: Diagnosis not present

## 2024-01-20 DIAGNOSIS — H35033 Hypertensive retinopathy, bilateral: Secondary | ICD-10-CM | POA: Diagnosis not present

## 2024-01-23 NOTE — Progress Notes (Unsigned)
 PTNS  Session # Monthly Maintenance  Health & Social Factors: No change Caffeine: 1 Alcohol: 0 Daytime voids #per day: 10 Night-time voids #per night: 2 Urgency: Strong Incontinence Episodes #per day: 2 Ankle used: Left  Treatment Setting: 19 Feeling/ Response: Sensory and toe flex Comments: Patient tolerated   Performed By: Trish LITTIE Pinal, RN  Follow Up: One week for #2/12 PTNS

## 2024-01-24 DIAGNOSIS — H353134 Nonexudative age-related macular degeneration, bilateral, advanced atrophic with subfoveal involvement: Secondary | ICD-10-CM | POA: Diagnosis not present

## 2024-01-24 DIAGNOSIS — Z961 Presence of intraocular lens: Secondary | ICD-10-CM | POA: Diagnosis not present

## 2024-01-24 DIAGNOSIS — H353221 Exudative age-related macular degeneration, left eye, with active choroidal neovascularization: Secondary | ICD-10-CM | POA: Diagnosis not present

## 2024-01-24 DIAGNOSIS — H3562 Retinal hemorrhage, left eye: Secondary | ICD-10-CM | POA: Diagnosis not present

## 2024-01-25 ENCOUNTER — Ambulatory Visit: Admitting: Urology

## 2024-01-25 VITALS — BP 134/73 | HR 79 | Ht 70.0 in | Wt 205.0 lb

## 2024-01-25 DIAGNOSIS — N3941 Urge incontinence: Secondary | ICD-10-CM

## 2024-02-01 ENCOUNTER — Ambulatory Visit: Admitting: Urology

## 2024-02-01 DIAGNOSIS — N3941 Urge incontinence: Secondary | ICD-10-CM | POA: Diagnosis not present

## 2024-02-01 NOTE — Progress Notes (Signed)
 PTNS  Session # 2/12  Health & Social Factors: no change Caffeine: 1 Alcohol: 0 Daytime voids #per day: 5 Night-time voids #per night: 6 Urgency: mild Incontinence Episodes #per day: 2 Ankle used: left Treatment Setting: 7 Feeling/ Response: toe flex Comments: patient tolerated well  Performed By: Powell Sink CMA  Follow Up:  One week for #3/12 PTNS

## 2024-02-02 ENCOUNTER — Ambulatory Visit: Payer: PPO

## 2024-02-02 DIAGNOSIS — I495 Sick sinus syndrome: Secondary | ICD-10-CM | POA: Diagnosis not present

## 2024-02-03 ENCOUNTER — Ambulatory Visit: Payer: Self-pay | Admitting: Cardiology

## 2024-02-03 ENCOUNTER — Telehealth: Payer: Self-pay | Admitting: Cardiovascular Disease

## 2024-02-03 LAB — CUP PACEART REMOTE DEVICE CHECK
Battery Remaining Longevity: 134 mo
Battery Voltage: 3.01 V
Brady Statistic AP VP Percent: 0.53 %
Brady Statistic AP VS Percent: 53.91 %
Brady Statistic AS VP Percent: 0.16 %
Brady Statistic AS VS Percent: 45.4 %
Brady Statistic RA Percent Paced: 55.65 %
Brady Statistic RV Percent Paced: 0.68 %
Date Time Interrogation Session: 20250723055957
Implantable Lead Connection Status: 753985
Implantable Lead Connection Status: 753985
Implantable Lead Implant Date: 20221024
Implantable Lead Implant Date: 20221024
Implantable Lead Location: 753859
Implantable Lead Location: 753860
Implantable Lead Model: 5076
Implantable Lead Model: 5076
Implantable Pulse Generator Implant Date: 20221024
Lead Channel Impedance Value: 304 Ohm
Lead Channel Impedance Value: 304 Ohm
Lead Channel Impedance Value: 361 Ohm
Lead Channel Impedance Value: 418 Ohm
Lead Channel Pacing Threshold Amplitude: 0.375 V
Lead Channel Pacing Threshold Amplitude: 0.75 V
Lead Channel Pacing Threshold Pulse Width: 0.4 ms
Lead Channel Pacing Threshold Pulse Width: 0.4 ms
Lead Channel Sensing Intrinsic Amplitude: 0.375 mV
Lead Channel Sensing Intrinsic Amplitude: 0.375 mV
Lead Channel Sensing Intrinsic Amplitude: 4.75 mV
Lead Channel Sensing Intrinsic Amplitude: 4.75 mV
Lead Channel Setting Pacing Amplitude: 1.5 V
Lead Channel Setting Pacing Amplitude: 2 V
Lead Channel Setting Pacing Pulse Width: 0.4 ms
Lead Channel Setting Sensing Sensitivity: 0.9 mV
Zone Setting Status: 755011
Zone Setting Status: 755011

## 2024-02-03 NOTE — Telephone Encounter (Signed)
 DOD encounter. Called patient regarding morning call, patient stated last night he coughed up some blood and for some weeks now has been bleeding out of a blood vessel in his eye. He states he has been to the eye doctor for this and they gave him some sort of shot and it only helped for a small while. Patient states this bleeding bleed vessel is making him almost blind in his left eye, wants some recommendations on what to do.

## 2024-02-03 NOTE — Telephone Encounter (Signed)
 Pt called in stating he is on xarelto  and his eye has been bleeding. Please advise.

## 2024-02-03 NOTE — Telephone Encounter (Signed)
 Do we have a sense of how much blood he coughed up?  If was just some streaks of blood with phlegm, he is probably okay to continue the rivaroxaban  for now.  If he coughed up frank blood, he will need to stop the rivaroxaban  and be evaluated by his PCP/pulmonologist (if he has 1) or go to the ER soon as possible.  As for his continued bleeding in the eye, I suggest he consult with his ophthalmologist about potential benefits of stopping rivaroxaban .  I will also forward to our EP team who are primarily managing his atrial fibrillation/anticoagulation for their recommendations.  If decision is made to hold rivaroxaban , aspirin  81 mg daily should be started unless he has gross hemoptysis given his history of prior stent placement.  Lonni Hanson, MD [facility]

## 2024-02-08 ENCOUNTER — Ambulatory Visit: Admitting: Physician Assistant

## 2024-02-08 DIAGNOSIS — H35033 Hypertensive retinopathy, bilateral: Secondary | ICD-10-CM | POA: Diagnosis not present

## 2024-02-08 DIAGNOSIS — H353231 Exudative age-related macular degeneration, bilateral, with active choroidal neovascularization: Secondary | ICD-10-CM | POA: Diagnosis not present

## 2024-02-10 ENCOUNTER — Telehealth: Payer: Self-pay | Admitting: Cardiology

## 2024-02-10 NOTE — Telephone Encounter (Signed)
 Pt c/o medication issue:  1. Name of Medication: Rivaroxaban  (XARELTO ) 15 MG TABS tablet   2. How are you currently taking this medication (dosage and times per day)?   3. Are you having a reaction (difficulty breathing--STAT)?   4. What is your medication issue? Patient called stating he is having bleeding in his eye.  States the eye doctor told him it might be coming from this medication.

## 2024-02-10 NOTE — Telephone Encounter (Signed)
Patient is asking that the nurse gives him a call back. Please advise 

## 2024-02-11 NOTE — Telephone Encounter (Signed)
 Returned pt's call.  Pt has bleeding behind his left eye and has had it since 02/03/2024.  He went to see his ophthalmologist and also his macrodegenerative specialist in [place], both ophthalmologist feel that he needs to stop Xeralto.  Pt would also like to have a lab drawn to check his PT/INR to evaluate his bleeding times.  Also, he understands he needs to be anticoagulated, but concerned with the bleeding. I will reach out to Dr. Darron and the DOD to receive a definitive answer as to what the patient should do in regards to stopping Xeralto and starting ASA.

## 2024-02-11 NOTE — Telephone Encounter (Signed)
 Hold Xarelto  for now until the bleeding stops.  Checking PT/INR is not going to be helpful.  He has a follow-up appointment with me soon and we can decide then if we need to resume anticoagulation.  Aspirin  is not helpful in atrial fibrillation.

## 2024-02-11 NOTE — Telephone Encounter (Signed)
 There is no definitive right answer here and we have to balance the risks and benefits.  He could stop Xarelto  for 1 week and resume after.  This should allow enough healing to happen.

## 2024-02-11 NOTE — Telephone Encounter (Signed)
 Called patient back with Dr. Renato recommendations to HOLD Xarelto  until bleeding stops behind his eye.  Explained to pt that per Dr. Darron having his PT/INR checked would not be helpful in his situation and that ASA isn't helpful in afib.  Pt has an appointment with Dr. Darron on 8/13 and other options would be discussed at that time.  Pt states he didn't feel comfortable being off Xarelto  for 2 weeks.  I asked if he had a follow up appointment with his ophthalmologist to determine if the bleeding had stopped.  He stated that he had an appointment with them on 8/11.  Pt states that the ophthalmologist gave him a shot to stop the bleeding, but has no way of knowing if bleeding completely stopped.  Pt verbalized he would follow Dr. Renato recommendations, but he is concerned with stopping his anticoagulation medication all together.  Advised pt to call us  back if he had further questions/concerns.

## 2024-02-11 NOTE — Telephone Encounter (Signed)
 Called patient back to let him know that Dr. Darron heard his concerns and informed him to stop Xarelto  for a week and then restart the medication to allow healing time for his left eye.  Pt verbalized understanding and repeated the instructions back to me. Pt was pleased with this solution and thanked us  for calling him.

## 2024-02-14 ENCOUNTER — Other Ambulatory Visit: Payer: Self-pay | Admitting: Cardiology

## 2024-02-14 DIAGNOSIS — M25461 Effusion, right knee: Secondary | ICD-10-CM | POA: Diagnosis not present

## 2024-02-14 DIAGNOSIS — G8929 Other chronic pain: Secondary | ICD-10-CM | POA: Diagnosis not present

## 2024-02-14 DIAGNOSIS — M1711 Unilateral primary osteoarthritis, right knee: Secondary | ICD-10-CM | POA: Diagnosis not present

## 2024-02-14 NOTE — Telephone Encounter (Signed)
 Prescription refill request for Xarelto  received.  Indication:afib Last office visit:4/25 Weight:93  kg Age:89 Scr:1.3  6/25 CrCl:41.38  ml/min  Prescription refilled

## 2024-02-15 ENCOUNTER — Other Ambulatory Visit (HOSPITAL_COMMUNITY): Payer: Self-pay

## 2024-02-15 ENCOUNTER — Ambulatory Visit: Admitting: Physician Assistant

## 2024-02-15 ENCOUNTER — Telehealth: Payer: Self-pay | Admitting: Cardiology

## 2024-02-15 VITALS — BP 117/64 | HR 77 | Ht 70.0 in | Wt 208.0 lb

## 2024-02-15 DIAGNOSIS — N3941 Urge incontinence: Secondary | ICD-10-CM

## 2024-02-15 MED ORDER — APIXABAN 5 MG PO TABS: 5.0000 mg | ORAL_TABLET | Freq: Two times a day (BID) | ORAL | 6 refills | Status: AC

## 2024-02-15 MED ORDER — APIXABAN 5 MG PO TABS
5.0000 mg | ORAL_TABLET | Freq: Two times a day (BID) | ORAL | 6 refills | Status: DC
Start: 2024-02-15 — End: 2024-02-15
  Filled 2024-02-15: qty 60, 30d supply, fill #0

## 2024-02-15 NOTE — Telephone Encounter (Signed)
 I called patient to discuss his ocular bleeding, ophtho says that it may take months to improve, or may be permanent.  Patient requests to switch from xarelto  back to eliquis  that he tolerated without any issue. He is aware that he may require frequent dose adjustments of eliquis  d/t Cr.  5mg  eliquis  BID sent to preferred pharmacy. He will take 1st dose of eliquis  when he would normally take his xarelto . Reiterated that eliquis  is a BID medication compared to xarelto  being daily.  Recommended he discuss long-term NOAC use with Dr. Cindie and whether a watchman procedure would be appropriate.   Further, recommended turning ON AF alarms on his PPM, which must be done in person.   Appt scheduled to see Dr. Cindie 8/20 to discuss all above.

## 2024-02-15 NOTE — Addendum Note (Signed)
 Addended by: BEECHER CORK on: 02/15/2024 04:22 PM   Modules accepted: Orders

## 2024-02-15 NOTE — Progress Notes (Signed)
 PTNS  Session # 3  Health & Social Factors: Slight Change Caffeine: 1 Alcohol: 2-3 times per week Daytime voids #per day: 5-6 Night-time voids #per night: 4 Urgency: Mild Incontinence Episodes #per day: 1 Ankle used: Right Treatment Setting: 5 Feeling/ Response: Toe Flex & Sensory Comments: Patient tolerated well   Performed By: Beauford Browner CCMA  Follow Up: One Week for PTNS #4

## 2024-02-15 NOTE — Telephone Encounter (Signed)
 Pt said he has called about his Eye bleeding. He said his Eye doctor thinks it is coming from his Xarelto . He said  he has called and has gotten three different answers. He wants to speak to Elvie Needle.

## 2024-02-15 NOTE — Telephone Encounter (Signed)
 Returned patient's call regarding bleeding behind his eye. He went to ophthalmologist and received a shot to stop the bleeding, but ophthalmologist advised him to contact his cardiologist for advise on Xarelto .  He states that he doesn't know what to do because he had 3 different doctors give him advise, Doctor on Call (didn't remember his name)  said STOP Xarelto  and take a low dose Aspirin ; Benjamin Needle, Benjamin Brewer advised not stopping Xarelto  due to Afib;  Benjamin Brewer suggested stopping Xarelto  for 1 week and but  Aspirin  would not have much benefit. Pt states that he never stopped the Xarelto , but is concerned he will start bleeding in the other eye.  He has another appointment with his Ophthalmologist next week, and has an appointment with Benjamin Brewer on 8/13.  Pt requests to talk to Benjamin Brewer directly.  I assured pt that I would forward his concerns to Benjamin.

## 2024-02-17 ENCOUNTER — Telehealth: Payer: Self-pay | Admitting: Pharmacy Technician

## 2024-02-17 ENCOUNTER — Other Ambulatory Visit (HOSPITAL_COMMUNITY): Payer: Self-pay

## 2024-02-17 NOTE — Telephone Encounter (Signed)
 Pharmacy Patient Advocate Encounter   Received notification from CoverMyMeds that prior authorization for Eliquis  5MG  is required/requested.   Insurance verification completed.   The patient is insured through [employer] .   Per test claim: Refill too soon. PA is not needed at this time. Medication was filled 02/15/24. Next eligible fill date is 03/09/24.

## 2024-02-21 DIAGNOSIS — H3562 Retinal hemorrhage, left eye: Secondary | ICD-10-CM | POA: Diagnosis not present

## 2024-02-21 DIAGNOSIS — Z961 Presence of intraocular lens: Secondary | ICD-10-CM | POA: Diagnosis not present

## 2024-02-21 DIAGNOSIS — H353221 Exudative age-related macular degeneration, left eye, with active choroidal neovascularization: Secondary | ICD-10-CM | POA: Diagnosis not present

## 2024-02-22 ENCOUNTER — Ambulatory Visit: Admitting: Urology

## 2024-02-22 DIAGNOSIS — N3941 Urge incontinence: Secondary | ICD-10-CM | POA: Diagnosis not present

## 2024-02-22 NOTE — Patient Instructions (Signed)

## 2024-02-22 NOTE — Progress Notes (Signed)
 PTNS  Session # 4  Health & Social Factors: no change Caffeine: weekly Alcohol: occasionally Daytime voids #per day: 6-8 Night-time voids #per night: 3-4 Urgency: strong Incontinence Episodes #per day: 1-2 Ankle used: right Treatment Setting: 5 Feeling/ Response: sensory Comments: Patient Tolerated well  Performed By: Laymon Ned CMA  Follow Up: One week for PTNS #5

## 2024-02-23 ENCOUNTER — Encounter: Payer: Self-pay | Admitting: Cardiovascular Disease

## 2024-02-23 ENCOUNTER — Ambulatory Visit: Attending: Cardiovascular Disease | Admitting: Cardiovascular Disease

## 2024-02-23 VITALS — BP 122/64 | HR 81 | Ht 70.0 in | Wt 200.0 lb

## 2024-02-23 DIAGNOSIS — I10 Essential (primary) hypertension: Secondary | ICD-10-CM

## 2024-02-23 DIAGNOSIS — I2510 Atherosclerotic heart disease of native coronary artery without angina pectoris: Secondary | ICD-10-CM

## 2024-02-23 DIAGNOSIS — E785 Hyperlipidemia, unspecified: Secondary | ICD-10-CM | POA: Diagnosis not present

## 2024-02-23 DIAGNOSIS — I480 Paroxysmal atrial fibrillation: Secondary | ICD-10-CM | POA: Diagnosis not present

## 2024-02-23 NOTE — Progress Notes (Signed)
 Cardiology Office Note   Date:  02/23/2024   ID:  HERRICK HARTOG, DOB 1930-11-27, MRN [id number]  PCP:  Diedra Lame, MD  Cardiologist:   Deatrice Cage, MD   No chief complaint on file.     History of Present Illness: Benjamin Brewer is a 89 y.o. male who presents for a followup visit regarding coronary artery disease and paroxysmal atrial fibrillation . He is status post angioplasty and drug-eluting stent placement to the LAD and RCA in 03-29-08.  He was diagnosed with atrial fibrillation with rapid ventricular response in 2015 and has been on anticoagulation since then.  Echocardiogram in July 2019 showed EF of 60 to 65%, calcified aortic valve without significant stenosis, mildly dilated left atrium and no significant pulmonary hypertension. The patient's wife died in Mar 29, 2018 and he had significant grief, anxiety and depression since then. He was diagnosed with cervical spine disease in 2019/03/30.  Treatment with prednisone  was associated with worsening atrial fibrillation.  He was diagnosed with COVID-19 infection in May of 2022 and had severe intermittent diarrhea since then.  In that setting, he had worsening palpitations with episodes of dizziness with a sense of pause in his heart.  A 14-day ZIO monitor was done which showed sinus rhythm with an average heart rate of 70 bpm.  There was 1 run of ventricular tachycardia that lasted 17 beats and he had 88 runs of supraventricular tachycardia the longest lasted 15 seconds.  He had one 4.1-second pause.  He underwent a permanent pacemaker placement in October 2022 for tachybradycardia syndrome.  He had atrial fibrillation with RVR in April 2024.   He converted to sinus rhythm with IV amiodarone  and was discharged home on oral amiodarone .    He had recent issues of bleeding in the left eye and was seen by ophthalmology.  He was switched from Xarelto  to Eliquis  and the situation has stabilized.  In addition, he had few episodes of coughing  small amount of blood-tinged sputum.  That has resolved as well.  No palpitations or shortness of breath.   Past Medical History:  Diagnosis Date   A-fib [facility])    a.) CHA2DS2VASc = 4 (age x 2, HTN, vascular disease history); b.) rate/rhythm maintained on oral amiodarone  + diltiazem  + metoprolol  succinate; chronically anticoagulated with apixaban    Anemia    Angina pectoris (HCC)    Aortic atherosclerosis (HCC)    Aortic root dilatation (HCC) 05/09/2020   a.) TTE 05/09/2020: 38 mm; b.) TTE 02/04/2022: 42 mm   Arthritis of both hands    Benign prostatic hypertrophy    CAD (coronary artery disease)    a.) LHC 12/05/2002: 90% prox diagonal, 30% pLCx, 30% mLCx, 20% pOM1, 40% pRCA, 30% mRCA - med mgmt; b.) LHC/PCI 05/15/2008: 99% pRCA (2.75 x 15 mm Promus DES), 80% mLAD (3.5 x 18 mm Promus DES)   CKD (chronic kidney disease), stage III (HCC)    Dental root implant present    Diastolic dysfunction    a.) TTE 09/04/2014: EF 55-60%, RVE, LAE, mild MR, G1DD; b.) TTE 01/15/2018: EF 60-65%, LAE, AoV sclerosis, triv MR/TR/PR, G1DD; c.) TTE 05/09/2020: EF 55-60%, LAD, mild-mod AoV sclerosis, G1DD; d.) TTE 04/25/2021: EF 60-65%, LVH, mild-mod MR, G1DD; e.) TTE 02/04/2022: EF 60-65%, mod LVH, LAE, mod RAE, AoV sclerosis, triv MR, G1DD   DOE (dyspnea on exertion)    Dyslipidemia    GERD (gastroesophageal reflux disease)    History of Mar 29, 2018 novel coronavirus disease (COVID-19) 11/2020  Hyperlipidemia    Hypertension    Long term current use of amiodarone     NSVT (nonsustained ventricular tachycardia) (HCC) 02/12/2021   a.) holter 02/12/2021: 17 beat run at a maximum rate of 154 bpm   Numbness and tingling of both lower extremities    Phimosis of penis    Pneumonia    Presence of permanent cardiac pacemaker 05/05/2021   a.) s/p dual-chamber PPM 05/05/2021; MDT Azure XT DR MRI SureScan (SN: [id number])   PSVT (paroxysmal supraventricular tachycardia) (HCC) 01/12/2018   a.) holter 01/12/2018: PSVT runs  --> longest lasting 14 beats at max rate of 142 bpm; b.) holter 02/12/2021: PSVT x 88 runs  --> longest lasting 5 beats at a maximum rat of 182 bpm   Tachy-brady syndrome (HCC)    a.) s/p PPM placement 05/05/2021   Vertigo     Past Surgical History:  Procedure Laterality Date   CARDIAC CATHETERIZATION     [facility]   CATARACT EXTRACTION [facility] Left 07/03/2019   Procedure: CATARACT EXTRACTION PHACO AND INTRAOCULAR LENS PLACEMENT (IOC) LEFT 10.19  01:06.9;  Surgeon: Myrna Adine Anes, MD;  Location: [facility];  Service: Ophthalmology;  Laterality: Left;   COLONOSCOPY     CORONARY ANGIOPLASTY WITH STENT PLACEMENT Left 05/15/2008   Procedure: CORONARY ANGIOPLASTY WITH STENT PLACEMENT; Location: [facility]; Surgeon: Lonni Ditch, MD   DORSAL SLIT N/A 11/30/2022   Procedure: DORSAL SLIT;  Surgeon: Penne Knee, MD;  Location: [facility] OR[facility];  Service: Urology;  Laterality: N/A;   ESOPHAGOGASTRODUODENOSCOPY (EGD) WITH PROPOFOL  N/A 06/10/2015   Procedure: ESOPHAGOGASTRODUODENOSCOPY (EGD) WITH PROPOFOL ;  Surgeon: Lamar ONEIDA Holmes, MD;  Location: [facility] END[facility];  Service: Endoscopy;  Laterality: N/A;   HERNIA REPAIR     LEFT HEART CATH AND CORONARY ANGIOGRAPHY Left 12/05/2002   Procedure: CORONARY ANGIOPLASTY WITH STENT PLACEMENT; Location: [facility]; Surgeon: Elsie Guan, MD   PACEMAKER IMPLANT N/A 05/05/2021   Procedure: PACEMAKER IMPLANT;  Surgeon: Cindie Ole ONEIDA, MD;  Location: [facility] INVASIVE CV LAB;  Service: Cardiovascular;  Laterality: N/A;   SAVORY DILATION N/A 06/10/2015   Procedure: SAVORY DILATION;  Surgeon: Lamar ONEIDA Holmes, MD;  Location: [facility] ENDOSCOPY;  Service: Endoscopy;  Laterality: N/A;   TONSILLECTOMY       Current Outpatient Medications  Medication Sig Dispense Refill   amoxicillin (AMOXIL) 250 MG capsule Take 250 mg by mouth 2 (two) times daily. (Patient taking differently: Take 250 mg by mouth as needed.)     nitroGLYCERIN  (NITROSTAT ) 0.4 MG SL  tablet Place 1 nitroglycerin  under your tongue, while sitting. If no relief of pain, may repeat 1 tablet every 5 minutes up to 3 tablets over 15 minutes. (Patient taking differently: as needed. Place 1 nitroglycerin  under your tongue, while sitting. If no relief of pain, may repeat 1 tablet every 5 minutes up to 3 tablets over 15 minutes.) 25 tablet 3   amiodarone  (PACERONE ) 100 MG tablet Take 1 tablet (100 mg total) by mouth daily. 90 tablet 3   apixaban  (ELIQUIS ) 5 MG TABS tablet Take 1 tablet (5 mg total) by mouth 2 (two) times daily. 60 tablet 6   atorvastatin  (LIPITOR) 40 MG tablet TAKE 1 TABLET BY MOUTH EVERY MORNING 90 tablet 1   betamethasone  dipropionate 0.05 % cream Apply topically 2 (two) times daily. 30 g 3   Carboxymethylcellul-Glycerin (LUBRICATING EYE DROPS OP) Place 1 drop into both eyes daily as needed (dry eyes).     Cholecalciferol (VITAMIN D3 PO) Take 1 capsule by mouth daily.  finasteride  (PROSCAR ) 5 MG tablet Take 5 mg by mouth daily.     losartan  (COZAAR ) 25 MG tablet Take 25 mg by mouth daily.     metoprolol  succinate (TOPROL -XL) 25 MG 24 hr tablet Take 25 mg by mouth 2 (two) times daily at 10 AM and 5 PM.     Multiple Vitamin (MULTIVITAMIN) tablet Take 1 tablet by mouth daily.     nystatin  cream (MYCOSTATIN ) Apply 1 Application topically 2 (two) times daily. 30 g 3   pantoprazole  (PROTONIX ) 40 MG tablet TAKE 1 TABLET BY MOUTH AT BEDTIME 30 tablet 11   No current facility-administered medications for this visit.    Allergies:   Prednisone  and Terazosin    Social History:  The patient  reports that he has never smoked. He has never used smokeless tobacco. He reports current alcohol use of about 1.0 standard drink of alcohol per week. He reports that he does not use drugs.   Family History:  The patient's family history includes Heart attack in his mother; Hypertension in his mother; Stroke in his mother and another family member.    ROS:  Please see the history of  present illness.   Otherwise, review of systems are positive for none.   All other systems are reviewed and negative.    PHYSICAL EXAM: VS:  BP 122/64 (BP Location: Left Arm, Patient Position: Sitting, Cuff Size: Normal)   Pulse 81   Ht 5' 10 (1.778 m)   Wt 200 lb (90.7 kg)   SpO2 96%   BMI 28.70 kg/m  , BMI Body mass index is 28.7 kg/m. GEN: Well nourished, well developed, in no acute distress  HEENT: normal  Neck: no JVD, carotid bruits, or masses Cardiac: RRR; no  rubs, or gallops,no edema .  1 /6 systolic murmur at the aortic area Respiratory:  clear to auscultation bilaterally, normal work of breathing GI: soft, nontender, nondistended, + BS MS: no deformity or atrophy  Skin: warm and dry, no rash Neuro:  Strength and sensation are intact Psych: euthymic mood, full affect   EKG:  EKG is ordered today. The ekg ordered today demonstrates : Normal sinus rhythm Low voltage QRS Inferior infarct (cited on or before 23-Feb-2024) Possible Anterolateral infarct (cited on or before 23-Feb-2024) When compared with ECG of 25-Oct-2023 10:31, Sinus rhythm has replaced Electronic atrial pacemaker Questionable change in initial forces of Lateral leads    Recent Labs: 08/13/2023: BUN 21; Creatinine, Ser 1.49; Potassium 4.7; Sodium 142; TSH 5.980 09/16/2023: Hemoglobin 10.8; Platelets 294    Lipid Panel    Component Value Date/Time   CHOL 115 10/08/2012 0554   TRIG 75 10/08/2012 0554   HDL 43 10/08/2012 0554   VLDL 15 10/08/2012 0554   LDLCALC 57 10/08/2012 0554      Wt Readings from Last 3 Encounters:  02/23/24 200 lb (90.7 kg)  02/15/24 208 lb (94.3 kg)  01/25/24 205 lb (93 kg)        ASSESSMENT AND PLAN:  1.  Coronary artery disease involving native coronary arteries without angina: He is doing well overall with no anginal symptoms.  No antiplatelet medication given that he is on anticoagulation.    2. Paroxysmal atrial fibrillation: He is maintaining in sinus  rhythm with small dose amiodarone  100 mg once daily.  He had issues of bleeding in his left eye recently but that has stabilized and he was switched from Xarelto  to Eliquis  which has helped better bleeding profile. He is considering Youth worker  device placement but I think this should be left as a last resort considering his advanced age.  3. Essential hypertension: Blood pressure is controlled.  4. Hyperlipidemia: Continue treatment with atorvastatin  40 mg once daily.  I reviewed most recent lipid profile done in April which showed an LDL of 71.  5.  Status post permanent pacemaker placement: Followed by our device clinic with stable function.    Disposition:   FU in 6 months.  Signed,  Deatrice Cage, MD  02/23/2024 5:11 PM    [facility]

## 2024-02-23 NOTE — Patient Instructions (Signed)

## 2024-02-29 ENCOUNTER — Ambulatory Visit: Admitting: Urology

## 2024-02-29 ENCOUNTER — Ambulatory Visit: Admitting: Physician Assistant

## 2024-02-29 VITALS — BP 145/68 | HR 91

## 2024-02-29 DIAGNOSIS — N3941 Urge incontinence: Secondary | ICD-10-CM | POA: Diagnosis not present

## 2024-02-29 NOTE — Progress Notes (Signed)
 PTNS  Session # 5  Health & Social Factors: no change Caffeine: 0 Alcohol: 1-2 per week Daytime voids #per day: 6-7 Night-time voids #per night: 5 Urgency: Strong Incontinence Episodes #per day: 1-2 Ankle used: Right Treatment Setting: 2 patient stated 3 was too strong Feeling/ Response: Both Comments: Patient tolerated well. Stated 3 was too strong and was more comfortable at 2  Performed By: Beauford Browner, CCMA  Follow Up: One week follow-up for PTNS #6

## 2024-02-29 NOTE — Patient Instructions (Signed)

## 2024-03-01 ENCOUNTER — Ambulatory Visit: Attending: Cardiology | Admitting: Cardiology

## 2024-03-01 ENCOUNTER — Other Ambulatory Visit: Payer: Self-pay

## 2024-03-01 ENCOUNTER — Encounter: Payer: Self-pay | Admitting: Cardiology

## 2024-03-01 VITALS — BP 138/78 | HR 84 | Ht 70.0 in | Wt 208.0 lb

## 2024-03-01 DIAGNOSIS — I480 Paroxysmal atrial fibrillation: Secondary | ICD-10-CM

## 2024-03-01 DIAGNOSIS — I495 Sick sinus syndrome: Secondary | ICD-10-CM

## 2024-03-01 DIAGNOSIS — Z79899 Other long term (current) drug therapy: Secondary | ICD-10-CM

## 2024-03-01 DIAGNOSIS — Z950 Presence of cardiac pacemaker: Secondary | ICD-10-CM | POA: Diagnosis not present

## 2024-03-01 NOTE — Progress Notes (Signed)
  Electrophysiology Office Follow up Visit Note:    Date:  03/01/2024   ID:  Benjamin Brewer, DOB [birth year], MRN [id number]  PCP:  Diedra Lame, MD  [facility] Cardiologist:  Deatrice Cage, MD  [facility] Electrophysiologist:  OLE ONEIDA HOLTS, MD    Interval History:     Benjamin Brewer is a 89 y.o. male who presents for a follow up visit.   [name] presents for follow-up.  I last saw him in October 2023.  He has atrial fibrillation and tachybradycardia syndrome.  He has a pacemaker in situ.  He saw Dr. Cage February 23, 2024.  He had bleeding in his left eye recently and was changed from Xarelto  to Eliquis .  He also had some blood-tinged sputum.  He is doing well today.  He has received 2 injections for ocular bleeding.  He has 1/3 injection planned.      Past medical, surgical, social and family history were reviewed.  ROS:   Please see the history of present illness.    All other systems reviewed and are negative.  EKGs/Labs/Other Studies Reviewed:    The following studies were reviewed today:  March 01, 2024 in clinic device interrogation personally reviewed .  Lead parameter stable No programming changes made today        Physical Exam:    VS:  BP 138/78   Pulse 84   Ht 5' 10 (1.778 m)   Wt 208 lb (94.3 kg)   SpO2 98%   BMI 29.84 kg/m     Wt Readings from Last 3 Encounters:  03/01/24 208 lb (94.3 kg)  02/23/24 200 lb (90.7 kg)  02/15/24 208 lb (94.3 kg)     GEN: no distress CARD: RRR, No MRG.  Pacemaker pocket well-healed RESP: No IWOB. CTAB.      ASSESSMENT:    1. Paroxysmal atrial fibrillation (HCC)   2. Tachy-brady syndrome (HCC)   3. Cardiac pacemaker in situ   4. Encounter for long-term (current) use of high-risk medication    PLAN:    In order of problems listed above:  #Paroxysmal atrial fibrillation #High risk med monitoring-amiodarone  Maintaining sinus rhythm Continue Eliquis  for stroke prophylaxis We  discussed left atrial appendage occlusion during today's clinic appointment.  We discussed how his advanced age puts him at increased risk of periprocedural complications.  For this reason, I do not think this is the best next step.  Need to repeat CMP, TSH and free T4 today.  #Tachybradycardia syndrome #Permanent pacemaker in situ Device functioning appropriately.  Continue remote monitoring.  Follow-up 6 months with APP.   Signed, OLE HOLTS, MD, [facility], [facility] 03/01/2024 10:23 AM    Electrophysiology [facility]

## 2024-03-01 NOTE — Patient Instructions (Signed)
 Medication Instructions:  Your physician recommends that you continue on your current medications as directed. Please refer to the Current Medication list given to you today.  *If you need a refill on your cardiac medications before your next appointment, please call your pharmacy*  Lab Work: TODAY: CMET, TSH, T4  Follow-Up: At [facility], you and your health needs are our priority.  As part of our continuing mission to provide you with exceptional heart care, our providers are all part of one team.  This team includes your primary Cardiologist (physician) and Advanced Practice Providers or APPs (Physician Assistants and Nurse Practitioners) who all work together to provide you with the care you need, when you need it.  Your next appointment:   6 months  Provider:   Suzann Riddle, NP

## 2024-03-02 ENCOUNTER — Ambulatory Visit: Payer: Self-pay | Admitting: *Deleted

## 2024-03-02 DIAGNOSIS — C44319 Basal cell carcinoma of skin of other parts of face: Secondary | ICD-10-CM | POA: Diagnosis not present

## 2024-03-02 DIAGNOSIS — L918 Other hypertrophic disorders of the skin: Secondary | ICD-10-CM | POA: Diagnosis not present

## 2024-03-02 DIAGNOSIS — D485 Neoplasm of uncertain behavior of skin: Secondary | ICD-10-CM | POA: Diagnosis not present

## 2024-03-02 LAB — COMPREHENSIVE METABOLIC PANEL WITH GFR
ALT: 16 IU/L (ref 0–44)
AST: 24 IU/L (ref 0–40)
Albumin: 4.3 g/dL (ref 3.6–4.6)
Alkaline Phosphatase: 106 IU/L (ref 44–121)
BUN/Creatinine Ratio: 12 (ref 10–24)
BUN: 17 mg/dL (ref 10–36)
Bilirubin Total: 0.3 mg/dL (ref 0.0–1.2)
CO2: 22 mmol/L (ref 20–29)
Calcium: 9.7 mg/dL (ref 8.6–10.2)
Chloride: 103 mmol/L (ref 96–106)
Creatinine, Ser: 1.39 mg/dL — ABNORMAL HIGH (ref 0.76–1.27)
Globulin, Total: 2 g/dL (ref 1.5–4.5)
Glucose: 75 mg/dL (ref 70–99)
Potassium: 4.6 mmol/L (ref 3.5–5.2)
Sodium: 140 mmol/L (ref 134–144)
Total Protein: 6.3 g/dL (ref 6.0–8.5)
eGFR: 48 mL/min/1.73 — ABNORMAL LOW (ref >59)

## 2024-03-02 LAB — T4, FREE: Free T4: 1.3 ng/dL (ref 0.82–1.77)

## 2024-03-02 LAB — TSH: TSH: 4.7 u[IU]/mL — ABNORMAL HIGH (ref 0.450–4.500)

## 2024-03-07 ENCOUNTER — Encounter: Payer: Self-pay | Admitting: Urology

## 2024-03-07 ENCOUNTER — Ambulatory Visit (INDEPENDENT_AMBULATORY_CARE_PROVIDER_SITE_OTHER): Admitting: Urology

## 2024-03-07 VITALS — BP 129/71 | HR 71 | Ht 70.0 in | Wt 199.2 lb

## 2024-03-07 DIAGNOSIS — N3941 Urge incontinence: Secondary | ICD-10-CM

## 2024-03-07 NOTE — Progress Notes (Signed)
 PTNS  Session # 6  Health & Social Factors: urinating less at night Caffeine: 1 cup a week Alcohol: 3-4 a week Daytime voids #per day: 5-6 Night-time voids #per night: 5-6 Urgency: strong Incontinence Episodes #per day: 2 Ankle used: right Treatment Setting: 2 Feeling/ Response: toe flex, sensory Comments: pt tolerated well  Performed By: Tyrus Buhl, RMA  Follow Up: 1 week

## 2024-03-07 NOTE — Patient Instructions (Signed)

## 2024-03-14 ENCOUNTER — Encounter: Payer: Self-pay | Admitting: Urology

## 2024-03-14 ENCOUNTER — Ambulatory Visit (INDEPENDENT_AMBULATORY_CARE_PROVIDER_SITE_OTHER): Admitting: Urology

## 2024-03-14 VITALS — BP 139/74 | HR 71 | Ht 70.0 in | Wt 200.4 lb

## 2024-03-14 DIAGNOSIS — N3941 Urge incontinence: Secondary | ICD-10-CM | POA: Diagnosis not present

## 2024-03-14 NOTE — Progress Notes (Unsigned)
 PTNS  Session # 7  Health & Social Factors: no change Caffeine: 2/ week Alcohol: 3-4/week Daytime voids #per day: 5-6 Night-time voids #per night: 3-4 Urgency: strong Incontinence Episodes #per day: 2-3 Ankle used: right Treatment Setting: 13 Feeling/ Response: base foot,below ankle Comments: pt tolerated well  Performed By: Tyrus Buhl, RMA  Follow Up: 1 week

## 2024-03-15 ENCOUNTER — Telehealth: Payer: Self-pay | Admitting: Cardiovascular Disease

## 2024-03-15 NOTE — Telephone Encounter (Signed)
   Pre-operative Risk Assessment    Patient Name: Benjamin Brewer  DOB: 12-Nov-1930 MRN: [id number]   Date of last office visit: 02/23/24 with Dr. Darron and 03/01/24 with Dr. :Cindie Date of next office visit: Not scheduled   Request for Surgical Clearance    Procedure:  Skin Cancer Removal through surgical excision  Date of Surgery:  Clearance 03/30/24                                Surgeon:  Dr. Arlyss Socks Group or Practice Name:  [facility] Phone number:  [phone] Fax number:  [phone]   Type of Clearance Requested:   - Pharmacy:  Hold Apixaban  (Eliquis ) hold 48hrs prior to procedure.   Type of Anesthesia:  Local    Additional requests/questions:  Please fax a copy of clearance request to the surgeon's office.  [name]   03/15/2024, 9:32 AM

## 2024-03-16 NOTE — Telephone Encounter (Signed)
 Patient with diagnosis of afib on Eliquis  for anticoagulation.    Procedure: Skin Cancer Removal through surgical excision  Date of procedure: 03/30/24   CHA2DS2-VASc Score = 4   This indicates a 4.8% annual risk of stroke. The patient's score is based upon: CHF History: 0 HTN History: 1 Diabetes History: 0 Stroke History: 0 Vascular Disease History: 1 Age Score: 2 Gender Score: 0      CrCl 39 Platelet count 300  Patient has not had an Afib/aflutter ablation within the last 3 months or DCCV within the last 30 days  Per office protocol, patient can hold Eliquis  for 2 days prior to procedure.   **This guidance is not considered finalized until pre-operative APP has relayed final recommendations.**

## 2024-03-16 NOTE — Telephone Encounter (Signed)
   Name: NGOC DAUGHTRIDGE  DOB: Mar 15, 1931  MRN: [id number]   Primary Cardiologist: Deatrice Cage, MD  Chart reviewed as part of pre-operative protocol coverage.   Per Pharm D, patient has not had an Afib/aflutter ablation within the last 3 months or DCCV within the last 30 days. Patient may hold Eliquis  for 2 days prior to procedure.    I will route this recommendation to the requesting party via Epic fax function and remove from pre-op pool. Please call with questions.  Barnie Hila, NP 03/16/2024, 11:25 AM

## 2024-03-21 ENCOUNTER — Ambulatory Visit: Admitting: Urology

## 2024-03-22 ENCOUNTER — Other Ambulatory Visit: Payer: Self-pay

## 2024-03-22 ENCOUNTER — Encounter: Payer: Self-pay | Admitting: Urology

## 2024-03-22 ENCOUNTER — Ambulatory Visit: Admitting: Urology

## 2024-03-22 VITALS — BP 106/67 | HR 82 | Ht 70.0 in | Wt 200.8 lb

## 2024-03-22 DIAGNOSIS — R3916 Straining to void: Secondary | ICD-10-CM

## 2024-03-22 DIAGNOSIS — N3941 Urge incontinence: Secondary | ICD-10-CM | POA: Diagnosis not present

## 2024-03-22 LAB — BLADDER SCAN AMB NON-IMAGING

## 2024-03-22 NOTE — Progress Notes (Signed)
 psa

## 2024-03-22 NOTE — Progress Notes (Signed)
 PTNS  Session # 8/12  Health & Social Factors: No change Caffeine: 1-2 a week Alcohol: 4-5 a week  Daytime voids #per day: 6-7 Night-time voids #per night: 3-4 Urgency: Strong  Incontinence Episodes #per day: 1-2 Ankle used: Right Treatment Setting: 1 Feeling/ Response: Sensroy  Comments: Patient tolerated  Performed By: CLOTILDA CORNWALL, PA-C   Follow Up: One week for # 9/12  He states he has not noticed much difference since starting the treatment.  He still has a lot of frequency.  He did mention over the last 2 weeks he has had a more difficult time emptying his bladder.  He has been having to push to get the urine out.  His PVR was 7 mL.  Patient denies any modifying or aggravating factors.  Patient denies any recent UTI's, gross hematuria, dysuria or suprapubic/flank pain.  Patient denies any fevers, chills, nausea or vomiting.  We discussed checking a PSA today as the symptom may be a sign of prostate cancer.  I explained that we have not checked a PSA in some time due to his advanced age, but since he is starting to have more urinary symptoms, by obtaining a PSA we could either rule in or rule out prostate cancer as a possible cause for his urinary symptoms.  He understands that if the PSA is found to be elevated, we will approach this in a palliative manner and he is in agreement.  PSA is drawn today and he will be notified of results.  If his PSA returns normal, we will have him prepared to give a urine specimen at his next PTNS appointment for further evaluation of his symptoms.

## 2024-03-23 ENCOUNTER — Ambulatory Visit: Payer: Self-pay | Admitting: Urology

## 2024-03-23 LAB — PSA: Prostate Specific Ag, Serum: 0.9 ng/mL (ref 0.0–4.0)

## 2024-03-27 DIAGNOSIS — H353221 Exudative age-related macular degeneration, left eye, with active choroidal neovascularization: Secondary | ICD-10-CM | POA: Diagnosis not present

## 2024-03-27 NOTE — Progress Notes (Signed)
 Called patient and patient didn't answer, Voicemail is full

## 2024-03-28 ENCOUNTER — Ambulatory Visit: Admitting: Physician Assistant

## 2024-03-28 ENCOUNTER — Ambulatory Visit: Admitting: Urology

## 2024-03-28 VITALS — BP 119/64 | HR 90

## 2024-03-28 DIAGNOSIS — R3916 Straining to void: Secondary | ICD-10-CM | POA: Diagnosis not present

## 2024-03-28 DIAGNOSIS — N3941 Urge incontinence: Secondary | ICD-10-CM | POA: Diagnosis not present

## 2024-03-28 LAB — MICROSCOPIC EXAMINATION

## 2024-03-28 LAB — URINALYSIS, COMPLETE
Bilirubin, UA: NEGATIVE
Glucose, UA: NEGATIVE
Nitrite, UA: NEGATIVE
Protein,UA: NEGATIVE
RBC, UA: NEGATIVE
Specific Gravity, UA: 1.02 (ref 1.005–1.030)
Urobilinogen, Ur: 0.2 mg/dL (ref 0.2–1.0)
pH, UA: 6 (ref 5.0–7.5)

## 2024-03-28 NOTE — Progress Notes (Signed)
 PTNS  Session # 9  Health & Social Factors: no change Caffeine: <1 Alcohol: <1 Daytime voids #per day: 6-8 Night-time voids #per night: 3-4 Urgency: strong Incontinence Episodes #per day: 0 Ankle used: right Treatment Setting: 19 Feeling/ Response: sensory Comments: Patient tolerated well.  Performed By: Kailoni Vahle, PA-C  Additional notes: Persistent frequency, straining. UA today with pyuria, bacteriuria. Will send for culture and treat as indicated. He denies fevers, dysuria.  Follow Up: 1 week

## 2024-03-30 DIAGNOSIS — C4491 Basal cell carcinoma of skin, unspecified: Secondary | ICD-10-CM | POA: Diagnosis not present

## 2024-03-30 DIAGNOSIS — C44319 Basal cell carcinoma of skin of other parts of face: Secondary | ICD-10-CM | POA: Diagnosis not present

## 2024-03-31 ENCOUNTER — Ambulatory Visit: Payer: Self-pay | Admitting: Physician Assistant

## 2024-03-31 LAB — CULTURE, URINE COMPREHENSIVE

## 2024-04-03 ENCOUNTER — Other Ambulatory Visit: Payer: Self-pay

## 2024-04-03 DIAGNOSIS — G8929 Other chronic pain: Secondary | ICD-10-CM | POA: Diagnosis not present

## 2024-04-03 DIAGNOSIS — R8290 Unspecified abnormal findings in urine: Secondary | ICD-10-CM

## 2024-04-03 DIAGNOSIS — M5442 Lumbago with sciatica, left side: Secondary | ICD-10-CM | POA: Diagnosis not present

## 2024-04-03 DIAGNOSIS — M5416 Radiculopathy, lumbar region: Secondary | ICD-10-CM | POA: Diagnosis not present

## 2024-04-03 MED ORDER — CEPHALEXIN 500 MG PO CAPS
500.0000 mg | ORAL_CAPSULE | Freq: Two times a day (BID) | ORAL | 0 refills | Status: DC
Start: 2024-04-03 — End: 2024-04-16

## 2024-04-03 NOTE — Progress Notes (Signed)
 Chief Complaint: Chief Complaint  Patient presents with  . Back Pain  . Hip Pain  . Follow-up  Back and left leg pain  HPI: Patient is a pleasant 89 y.o. male seen in follow-up for the evaluation of back pain traveling into the left leg.  He is accompanied by his wife.  He is status post left S1 TFESI 05/10/2023 with 75% improvement.  He denies any problems or complications postinjection.  He did note at least 3 months of improvement with the epidural injection.  Unfortunately his pain has returned.  He rates it as an 8/10.  It is intermittent, sharp.  Pain is chronic.  He denies any numbness, tingling, or loss of control of bowel or bladder.  He does note weakness in the left leg.  He does endorse a chronic history of urinary and bowel frequency.  He denies any saddle anesthesia.  Pain is worse with standing, walking and better with sitting and laying down.     Review of Systems: A 10 point review of systems is negative, except for the pertinent positives and negatives detailed in the HPI.  PMH: Past Medical History:  Diagnosis Date  . BPH (benign prostatic hypertrophy)    With prostatism, negative biopsy in 1993.   . Bright red blood per rectum    Flex sig in November 1995 showed irritated internal hemorrhoids, questionable tear only.  Air contrast barium enema in December 1995 was normal.  . CAD (coronary atherosclerotic disease) June 1999   Manifested by abnormal stress echo.  Cardiac cath showed normal LV function, normal left main, 60% mid-LAD, 80% first AL lesion.    . Hemangioma of liver February 1997   By CT and ultrasound.  . Hematospermia    With negative Urology workup.  SABRA History of duodenal ulcer 06/2008   With recurrent anemia, s/p transfusion.  . Hyperlipidemia   . Normocytic normochromic anemia    Resolved.  . Tachyarrhythmia, unspecified    Unclear type.  Cardiac cath x2 to include 1990 and 1993 showed insignificant disease.       PSH: Past Surgical History:   Procedure Laterality Date  . COLONOSCOPY  10/06/2005   Normal Colon  . EGD  06/23/2008   08/14/2005  . COLONOSCOPY  07/09/2008   (Inpt) Diverticulosis: CBF 06/2018  . HERNIA REPAIR Right 05/29/2010   Right inguinal hernia repair.  SABRA EGD  06/10/2015   No repeat per RTE  . CORONARY ANGIOPLASTY     Status post angioplasty and stenting x2.  . TONSILLECTOMY       Family History: Family History  Problem Relation Name Age of Onset  . Stroke Mother    . Myocardial Infarction (Heart attack) Brother       Social History: Social History   Socioeconomic History  . Marital status: Married  Tobacco Use  . Smoking status: Never  . Smokeless tobacco: Never  Vaping Use  . Vaping status: Never Used  Substance and Sexual Activity  . Alcohol use: Not Currently    Comment: Rare.  . Drug use: Never  . Sexual activity: Defer   Social Drivers of Health   Financial Resource Strain: Low Risk  (10/06/2023)   Overall Financial Resource Strain (CARDIA)   . Difficulty of Paying Living Expenses: Not hard at all  Food Insecurity: Unknown (10/06/2023)   Hunger Vital Sign   . Worried About Programme researcher, broadcasting/film/video in the Last Year: Patient declined   . Ran Out of Food in the  Last Year: Never true  Transportation Needs: No Transportation Needs (10/06/2023)   PRAPARE - Transportation   . Lack of Transportation (Medical): No   . Lack of Transportation (Non-Medical): No     Allergies: Allergies  Allergen Reactions  . Other Other (See Comments)    Prostate medication, pt unsure of name  . Prednisone  Other (See Comments)    Per patient it caused A-Fib.   . Terazosin Other (See Comments) and Muscle Pain    Muscle Pain     Medications:  Current Outpatient Medications:  .  AMIOdarone  (PACERONE ) 200 MG tablet, Take 1 tablet by mouth once daily, Disp: , Rfl:  .  apixaban  (ELIQUIS ) 5 mg tablet, Take 5 mg by mouth 2 (two) times daily, Disp: , Rfl:  .  atorvastatin  (LIPITOR) 40 MG tablet, Take 40 mg  by mouth once daily., Disp: , Rfl:  .  betamethasone  dipropionate (DIPROSONE ) 0.05 % cream, Apply 0.05 % topically 2 (two) times daily, Disp: , Rfl:  .  cholecalciferol (VITAMIN D3) 400 unit tablet, Take 400 Units by mouth once daily, Disp: , Rfl:  .  ferrous sulfate 325 (65 FE) MG EC tablet, Take 325 mg by mouth daily with breakfast, Disp: , Rfl:  .  finasteride  (PROSCAR ) 5 mg tablet, TAKE 1 TABLET BY MOUTH DAILY, Disp: 90 tablet, Rfl: 1 .  fluticasone  propionate (FLONASE ) 50 mcg/actuation nasal spray, Place 2 sprays into both nostrils once daily, Disp: 16 g, Rfl: 0 .  losartan  (COZAAR ) 25 MG tablet, Take 25 mg by mouth once daily, Disp: , Rfl:  .  metoprolol  SUCCinate (TOPROL -XL) 50 MG XL tablet, Take 50 mg by mouth once daily, Disp: , Rfl:  .  nitroGLYcerin  (NITROSTAT ) 0.4 MG SL tablet, PLACE 1 TABLET UNDER TONGUE EVERY 5 MIN AS NEEDED FOR CHEST PAIN IF NO RELIEF IN15 MIN CALL 911 (MAX 3 TABS), Disp: 25 tablet, Rfl: 0 .  pantoprazole  (PROTONIX ) 40 MG DR tablet, Take 40 mg by mouth once daily., Disp: , Rfl:  .  albuterol 90 mcg/actuation inhaler, Inhale 2 inhalations into the lungs every 4 (four) hours as needed for Wheezing, Disp: 1 each, Rfl: 0 .  rivaroxaban  (XARELTO ) 15 mg tablet, Take 15 mg by mouth (Patient not taking: Reported on 04/03/2024), Disp: , Rfl:   Vitals: Vitals:   04/03/24 0949  BP: (!) 151/82  Pulse: 86  Temp: 36.4 C (97.6 F)  TempSrc: Oral  Weight: 92.1 kg (203 lb 0.7 oz)  Height: 177.8 cm (5' 10)  PainSc:   5  PainLoc: Back    Imaging: CT Cervical spine without contrast from [facility] dated 08/12/2022: multilevel degenerative disc disease with intervertebral disc height loss and disc osteophyte complexes most prominent between C3-4 and C5-6 no central stenosis noted.  CT lumbar spine 03/2023 done at [facility] health: T12-L1 mild to moderate left NFS; L2-3 mild to moderate CCS, severe left NFS; L3-4 moderate CCS; L4-5 severe CCS, severe right and mild to moderate left NFS;  L5-S1 moderate left NFS  GFR 48 drawn on 03/01/2024 Platelet count 300 drawn on 01/10/2024  Assessment: Cervicalgia  Cervical radiculitis -s/p right C5-6 TFESI 09/29/2022 with 80% improvement  Acute low back pain with left lumbar radiculitis -s/p left L5-S1 TFESI 04/12/2023 with 30% relief -s/p left S1 TFESI 05/10/2023 with 75% improvement  History of atrial fibrillation on Eliquis   History of pacemaker  History of chronic kidney disease stage III  Plan: He denies any problems or complications postinjection.  He did get greater  than 3 months of 75% improvement with his last lumbosacral epidural injection in October 2024.  Unfortunately his pain has returned.  He did call into our office for referral for physical therapy last month.  I did place that order but he states he never received a phone call.  He is now interested in considering an epidural injection which I do feel is a reasonable.  I will set him up for a left S1 TFESI.  Despite the risk factor of being on Eliquis  I do believe benefits outweigh the risks.    In terms of physical therapy, we will hold off on PT until after the epidural injection.  Follow-up 2 weeks postinjection.  Would consider repeat with [facility] .  I will also consider PT.  I would consider physical therapy referral to neurosurgery depending on patient's wishes.  Patient agrees with above plan.  Answered all questions.

## 2024-04-04 ENCOUNTER — Encounter: Payer: Self-pay | Admitting: Urology

## 2024-04-04 ENCOUNTER — Ambulatory Visit (INDEPENDENT_AMBULATORY_CARE_PROVIDER_SITE_OTHER): Admitting: Urology

## 2024-04-04 VITALS — BP 123/65 | HR 80 | Ht 70.0 in | Wt 198.0 lb

## 2024-04-04 DIAGNOSIS — N3941 Urge incontinence: Secondary | ICD-10-CM

## 2024-04-04 NOTE — Progress Notes (Signed)
 PTNS  Session # 10  Health & Social Factors: change Carinoma was removed from his cheek. Caffeine: less than 1  Alcohol: less than 1  Daytime voids #per day: 5-6 Night-time voids #per night: 3-4 Urgency: Strong Incontinence Episodes #per day: 1 Ankle used: right Treatment Setting: 7 Feeling/ Response: felt in the heel Comments: no comments  Performed By: Clotilda Cornwall PA, Timiya Howells,CMA.  Follow Up: Return to the clinic in a week for next treatment.   When he first presented this morning he states he was feeling dizzy, his blood pressure was 95/80.  He was given fluid to drink and then after the procedure we rechecked his blood pressure and it was 123/65.  He has recently been diagnosed with a UTI and started his antibiotic yesterday.  Patient denies any modifying or aggravating factors.  Patient denies any recent UTI's, gross hematuria, dysuria or suprapubic/flank pain.  Patient denies any fevers, chills, nausea or vomiting.

## 2024-04-10 DIAGNOSIS — M5416 Radiculopathy, lumbar region: Secondary | ICD-10-CM | POA: Diagnosis not present

## 2024-04-11 ENCOUNTER — Ambulatory Visit: Admitting: Urology

## 2024-04-11 ENCOUNTER — Encounter: Payer: Self-pay | Admitting: Urology

## 2024-04-11 VITALS — BP 112/60 | HR 108 | Ht 70.0 in | Wt 198.8 lb

## 2024-04-11 DIAGNOSIS — N3941 Urge incontinence: Secondary | ICD-10-CM | POA: Diagnosis not present

## 2024-04-11 NOTE — Patient Instructions (Signed)

## 2024-04-11 NOTE — Progress Notes (Signed)
 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 Caffeine: less than 1  Alcohol: less than 1  Daytime voids #per day: 5-6 Night-time voids #per night: 4-6 Urgency: strong Incontinence Episodes #per day: 2-3 weekly Ankle used: right  Treatment Setting: 9 Feeling/ Response: sensory felt in heel Comments: no additional comments  Performed By: Myrtha Dante LATHER  Follow Up: 04/18/24

## 2024-04-12 DIAGNOSIS — H353231 Exudative age-related macular degeneration, bilateral, with active choroidal neovascularization: Secondary | ICD-10-CM | POA: Diagnosis not present

## 2024-04-12 DIAGNOSIS — H35033 Hypertensive retinopathy, bilateral: Secondary | ICD-10-CM | POA: Diagnosis not present

## 2024-04-14 NOTE — Progress Notes (Signed)
 Remote PPM Transmission

## 2024-04-16 ENCOUNTER — Ambulatory Visit
Admission: EM | Admit: 2024-04-16 | Discharge: 2024-04-16 | Disposition: A | Discharge status: Home / Self Care | Attending: Emergency Medicine | Admitting: Emergency Medicine

## 2024-04-16 DIAGNOSIS — R350 Frequency of micturition: Secondary | ICD-10-CM | POA: Insufficient documentation

## 2024-04-16 LAB — POCT URINE DIPSTICK
Bilirubin, UA: NEGATIVE
Blood, UA: NEGATIVE
Glucose, UA: NEGATIVE mg/dL
Ketones, POC UA: NEGATIVE mg/dL
Nitrite, UA: NEGATIVE
POC PROTEIN,UA: NEGATIVE
Spec Grav, UA: <=1.005 — AB (ref 1.010–1.025)
Urobilinogen, UA: 0.2 U/dL
pH, UA: 5.5 (ref 5.0–8.0)

## 2024-04-16 MED ORDER — CEFPODOXIME PROXETIL 100 MG PO TABS: 100.0000 mg | ORAL_TABLET | Freq: Two times a day (BID) | ORAL | 0 refills | Status: DC

## 2024-04-16 NOTE — ED Provider Notes (Signed)
 Benjamin Brewer    CSN: [id number] Arrival date & time: 04/16/24  1136      History   Chief Complaint Chief Complaint  Patient presents with   Urinary Frequency    HPI Benjamin Brewer is a 89 y.o. male.  Accompanied by his son, patient presents with urinary frequency x 2 days which is worse at night.  He states he is urinating 10-12 times at night and therefore is not getting good rest.  No fever, chills, dysuria, hematuria, abdominal pain, flank pain.  His medical history includes BPH, CKD, urge incontinence.  Patient is followed by urology; last seen on 04/11/2024.  Urine culture on 03/28/2024 positive for Klebsiella oxytoca; He was treated with cephalexin  by urology on 04/03/2024.  The history is provided by the patient, a relative and medical records.    Past Medical History:  Diagnosis Date   A-fib [facility])    a.) CHA2DS2VASc = 4 (age x 2, HTN, vascular disease history); b.) rate/rhythm maintained on oral amiodarone  + diltiazem  + metoprolol  succinate; chronically anticoagulated with apixaban    Anemia    Angina pectoris    Aortic atherosclerosis    Aortic root dilatation 05/09/2020   a.) TTE 05/09/2020: 38 mm; b.) TTE 02/04/2022: 42 mm   Arthritis of both hands    Benign prostatic hypertrophy    CAD (coronary artery disease)    a.) LHC 12/05/2002: 90% prox diagonal, 30% pLCx, 30% mLCx, 20% pOM1, 40% pRCA, 30% mRCA - med mgmt; b.) LHC/PCI 05/15/2008: 99% pRCA (2.75 x 15 mm Promus DES), 80% mLAD (3.5 x 18 mm Promus DES)   CKD (chronic kidney disease), stage III (HCC)    Dental root implant present    Diastolic dysfunction    a.) TTE 09/04/2014: EF 55-60%, RVE, LAE, mild MR, G1DD; b.) TTE 01/15/2018: EF 60-65%, LAE, AoV sclerosis, triv MR/TR/PR, G1DD; c.) TTE 05/09/2020: EF 55-60%, LAD, mild-mod AoV sclerosis, G1DD; d.) TTE 04/25/2021: EF 60-65%, LVH, mild-mod MR, G1DD; e.) TTE 02/04/2022: EF 60-65%, mod LVH, LAE, mod RAE, AoV sclerosis, triv MR, G1DD   DOE (dyspnea on  exertion)    Dyslipidemia    GERD (gastroesophageal reflux disease)    History of 2019 novel coronavirus disease (COVID-19) 11/2020   Hyperlipidemia    Hypertension    Long term current use of amiodarone     NSVT (nonsustained ventricular tachycardia) (HCC) 02/12/2021   a.) holter 02/12/2021: 17 beat run at a maximum rate of 154 bpm   Numbness and tingling of both lower extremities    Phimosis of penis    Pneumonia    Presence of permanent cardiac pacemaker 05/05/2021   a.) s/p dual-chamber PPM 05/05/2021; MDT Azure XT DR MRI SureScan (SN: [id number])   PSVT (paroxysmal supraventricular tachycardia) 01/12/2018   a.) holter 01/12/2018: PSVT runs --> longest lasting 14 beats at max rate of 142 bpm; b.) holter 02/12/2021: PSVT x 88 runs  --> longest lasting 5 beats at a maximum rat of 182 bpm   Tachy-brady syndrome (HCC)    a.) s/p PPM placement 05/05/2021   Vertigo     Patient Active Problem List   Diagnosis Date Noted   Inflamed seborrheic keratosis 01/07/2024   Cervical radiculopathy 08/05/2022   DDD (degenerative disc disease), cervical 08/05/2022   Tachy-brady syndrome (HCC) 05/05/2021   Melena 01/02/2021   Paresthesia of both feet 01/09/2019   Acute renal failure superimposed on stage 3 chronic kidney disease (HCC) 01/14/2018   Left arm numbness 01/14/2018   Generalized  weakness 01/14/2018   Left shoulder pain    Benign prostatic hyperplasia with lower urinary tract symptoms 11/01/2014   CKD (chronic kidney disease) stage 3, GFR 30-59 ml/min (HCC) 11/01/2014   GERD without esophagitis 11/01/2014   Atrial fibrillation (HCC) 08/20/2014   Pure hypercholesterolemia 04/26/2014   BPPV (benign paroxysmal positional vertigo) 10/23/2013   Sinus bradycardia 06/16/2013   Essential hypertension 06/16/2013   CAD, NATIVE VESSEL 07/03/2009   DYSPNEA ON EXERTION 12/21/2008   Hyperlipidemia 12/15/2008   BENIGN PROSTATIC HYPERTROPHY, HX OF 12/15/2008    Past Surgical History:   Procedure Laterality Date   CARDIAC CATHETERIZATION     [facility]   CATARACT EXTRACTION W/PHACO Left 07/03/2019   Procedure: CATARACT EXTRACTION PHACO AND INTRAOCULAR LENS PLACEMENT (IOC) LEFT 10.19  01:06.9;  Surgeon: Myrna Adine Anes, MD;  Location: [facility];  Service: Ophthalmology;  Laterality: Left;   COLONOSCOPY     CORONARY ANGIOPLASTY WITH STENT PLACEMENT Left 05/15/2008   Procedure: CORONARY ANGIOPLASTY WITH STENT PLACEMENT; Location: [facility]; Surgeon: Lonni Ditch, MD   DORSAL SLIT N/A 11/30/2022   Procedure: DORSAL SLIT;  Surgeon: Penne Knee, MD;  Location: [facility];  Service: Urology;  Laterality: N/A;   ESOPHAGOGASTRODUODENOSCOPY (EGD) WITH PROPOFOL  N/A 06/10/2015   Procedure: ESOPHAGOGASTRODUODENOSCOPY (EGD) WITH PROPOFOL ;  Surgeon: Lamar ONEIDA Holmes, MD;  Location: [facility] ENDOSCOPY;  Service: Endoscopy;  Laterality: N/A;   HERNIA REPAIR     LEFT HEART CATH AND CORONARY ANGIOGRAPHY Left 12/05/2002   Procedure: CORONARY ANGIOPLASTY WITH STENT PLACEMENT; Location: [facility]; Surgeon: Elsie Guan, MD   PACEMAKER IMPLANT N/A 05/05/2021   Procedure: PACEMAKER IMPLANT;  Surgeon: Cindie Ole ONEIDA, MD;  Location: [facility] INVASIVE CV LAB;  Service: Cardiovascular;  Laterality: N/A;   SAVORY DILATION N/A 06/10/2015   Procedure: SAVORY DILATION;  Surgeon: Lamar ONEIDA Holmes, MD;  Location: [facility]COPY;  Service: Endoscopy;  Laterality: N/A;   TONSILLECTOMY         Home Medications    Prior to Admission medications   Medication Sig Start Date End Date Taking? Authorizing Provider  cefpodoxime (VANTIN) 100 MG tablet Take 1 tablet (100 mg total) by mouth 2 (two) times daily for 5 days. 04/16/24 04/21/24 Yes Corlis Burnard DEL, NP  amiodarone  (PACERONE ) 100 MG tablet Take 1 tablet (100 mg total) by mouth daily. 06/08/23   Darron Deatrice LABOR, MD  amoxicillin (AMOXIL) 250 MG capsule Take 250 mg by mouth 2 (two) times daily. Patient taking differently: Take 250 mg  by mouth as needed. 01/04/24   [provider]  apixaban  (ELIQUIS ) 5 MG TABS tablet Take 1 tablet (5 mg total) by mouth 2 (two) times daily. 02/15/24   Riddle, Suzann, NP  apixaban  (ELIQUIS ) 5 MG TABS tablet Take 5 mg by mouth 2 (two) times daily.    [provider]  atorvastatin  (LIPITOR) 40 MG tablet TAKE 1 TABLET BY MOUTH EVERY MORNING 12/01/23   Darron Deatrice LABOR, MD  betamethasone  dipropionate 0.05 % cream Apply topically 2 (two) times daily. 01/07/24   McGowan, Clotilda LABOR, PA-C  Carboxymethylcellul-Glycerin (LUBRICATING EYE DROPS OP) Place 1 drop into both eyes daily as needed (dry eyes).    [provider]  Cholecalciferol (VITAMIN D3 PO) Take 1 capsule by mouth daily.    [provider]  finasteride  (PROSCAR ) 5 MG tablet Take 5 mg by mouth daily. 09/28/18   [provider]  losartan  (COZAAR ) 25 MG tablet Take 25 mg by mouth daily.    [provider]  metoprolol  succinate (TOPROL -XL) 25 MG 24 hr tablet Take 25 mg by mouth 2 (two) times daily at 10 AM and 5 PM.    [provider]  Multiple Vitamin (MULTIVITAMIN) tablet Take 1 tablet by mouth daily.    [provider]  nitroGLYCERIN  (NITROSTAT ) 0.4 MG SL tablet Place 1 nitroglycerin  under your tongue, while sitting. If no relief of pain, may repeat 1 tablet every 5 minutes up to 3 tablets over 15 minutes. Patient taking differently: as needed. Place 1 nitroglycerin  under your tongue, while sitting. If no relief of pain, may repeat 1 tablet every 5 minutes up to 3 tablets over 15 minutes. 06/08/23   Darron Deatrice LABOR, MD  nystatin  cream (MYCOSTATIN ) Apply 1 Application topically 2 (two) times daily. 01/07/24   McGowan, Clotilda LABOR, PA-C  pantoprazole  (PROTONIX ) 40 MG tablet TAKE 1 TABLET BY MOUTH AT BEDTIME 04/12/23   Darron Deatrice LABOR, MD    Family History Family History  Problem Relation Age of Onset   Stroke Mother    Heart attack Mother    Hypertension Mother    Stroke Other         family history    Social History Social History   Tobacco Use   Smoking status: Never   Smokeless tobacco: Never  Vaping Use   Vaping status: Never Used  Substance Use Topics   Alcohol use: Yes    Alcohol/week: 1.0 standard drink of alcohol    Types: 1 Glasses of wine per week    Comment: occasional    Drug use: No     Allergies   Prednisone  and Terazosin   Review of Systems Review of Systems  Constitutional:  Negative for chills and fever.  Gastrointestinal:  Negative for abdominal pain.  Genitourinary:  Positive for frequency. Negative for dysuria, flank pain and hematuria.     Physical Exam Triage Vital Signs ED Triage Vitals  Encounter Vitals Group     BP 04/16/24 1244 126/81     Girls Systolic BP Percentile --      Girls Diastolic BP Percentile --      Boys Systolic BP Percentile --      Boys Diastolic BP Percentile --      Pulse Rate 04/16/24 1244 78     Resp 04/16/24 1244 19     Temp 04/16/24 1244 97.7 F (36.5 C)     Temp src --      SpO2 04/16/24 1244 98 %     Weight --      Height --      Head Circumference --      Peak Flow --      Pain Score 04/16/24 1242 3     Pain Loc --      Pain Education --      Exclude from Growth Chart --    No data found.  Updated Vital Signs BP 126/81   Pulse 78   Temp 97.7 F (36.5 C)   Resp 19   SpO2 98%   Visual Acuity Right Eye Distance:   Left Eye Distance:   Bilateral Distance:    Right Eye Near:   Left Eye Near:    Bilateral Near:     Physical Exam Constitutional:      General: He is not in acute distress. HENT:     Mouth/Throat:     Mouth: Mucous membranes are moist.  Cardiovascular:     Rate and Rhythm: Normal rate.  Pulmonary:  Effort: Pulmonary effort is normal. No respiratory distress.  Abdominal:     General: Bowel sounds are normal.     Palpations: Abdomen is soft.     Tenderness: There is no abdominal tenderness. There is no right CVA tenderness, left CVA tenderness,  guarding or rebound.  Neurological:     Mental Status: He is alert.      UC Treatments / Results  Labs (all labs ordered are listed, but only abnormal results are displayed) Labs Reviewed  POCT URINE DIPSTICK - Abnormal; Notable for the following components:      Result Value   Spec Grav, UA <=1.005 (*)    Leukocytes, UA Small (1+) (*)    All other components within normal limits  URINE CULTURE    EKG   Radiology No results found.  Procedures Procedures (including critical care time)  Medications Ordered in UC Medications - No data to display  Initial Impression / Assessment and Plan / UC Course  I have reviewed the triage vital signs and the nursing notes.  Pertinent labs & imaging results that were available during my care of the patient were reviewed by me and considered in my medical decision making (see chart for details).    Urinary frequency.  Afebrile and vital signs are stable.  Patient's most recent urine culture was positive for Klebsiella oxytoca; he was treated with cephalexin  by his urologist.  Today's urine culture is pending.  Treating with cefpodoxime based on his most recent urine culture.  His creatinine clearance calculated today is 44 based on his most recent lab work.  Discussed with patient that he needs to follow-up with his PCP or urologist tomorrow.  Education provided on urinary frequency.  Patient and his son agree to plan of care.  Final Clinical Impressions(s) / UC Diagnoses   Final diagnoses:  Urinary frequency     Discharge Instructions      Take the antibiotic as directed.  The urine culture is pending.  We will call you if it shows the need to change or discontinue your antibiotic.    Follow-up with your primary care provider or urologist tomorrow.     ED Prescriptions     Medication Sig Dispense Auth. Provider   cefpodoxime (VANTIN) 100 MG tablet Take 1 tablet (100 mg total) by mouth 2 (two) times daily for 5 days. 10  tablet Corlis Burnard DEL, NP      PDMP not reviewed this encounter.   Corlis Burnard DEL, NP 04/16/24 1323

## 2024-04-16 NOTE — ED Triage Notes (Signed)
 Patient to Urgent Care with complaints of urinary frequency.  Symptoms started two nights ago (reports going 10-12 times during the night).

## 2024-04-16 NOTE — Discharge Instructions (Addendum)
 Take the antibiotic as directed.  The urine culture is pending.  We will call you if it shows the need to change or discontinue your antibiotic.    Follow-up with your primary care provider or urologist tomorrow.

## 2024-04-17 ENCOUNTER — Ambulatory Visit (HOSPITAL_COMMUNITY): Payer: Self-pay

## 2024-04-17 LAB — URINE CULTURE: Culture: NO GROWTH

## 2024-04-18 ENCOUNTER — Ambulatory Visit: Admitting: Physician Assistant

## 2024-04-18 VITALS — BP 106/55 | HR 86

## 2024-04-18 DIAGNOSIS — N3941 Urge incontinence: Secondary | ICD-10-CM

## 2024-04-18 NOTE — Progress Notes (Signed)
 PTNS  Session # 12  Health & Social Factors: Increased nocturia this week, up to x11. He went to urgent care and was put on cefpodoxime, but culture is negative. Caffeine: 0 Alcohol: 0 Daytime voids #per day: 5-6 Night-time voids #per night: 11 Urgency: strong Incontinence Episodes #per day: 2-3 Ankle used: right Treatment Setting: 3 Feeling/ Response: both Comments: Patient tolerated well. He feels like he has had several inadequate treatments, though admits to feeling sensation in the heel/foot with most of them. I asked him to call me Friday to see if he has any immediate improvement after his strong response today, may consider augmenting per response. He may stop antibiotics.  Performed By: Hampton Cost, PA-C   Follow Up: As scheduled

## 2024-04-19 DIAGNOSIS — G8929 Other chronic pain: Secondary | ICD-10-CM | POA: Diagnosis not present

## 2024-04-19 DIAGNOSIS — M5442 Lumbago with sciatica, left side: Secondary | ICD-10-CM | POA: Diagnosis not present

## 2024-04-19 DIAGNOSIS — M5416 Radiculopathy, lumbar region: Secondary | ICD-10-CM | POA: Diagnosis not present

## 2024-04-20 DIAGNOSIS — M5416 Radiculopathy, lumbar region: Secondary | ICD-10-CM | POA: Diagnosis not present

## 2024-04-24 DIAGNOSIS — H353132 Nonexudative age-related macular degeneration, bilateral, intermediate dry stage: Secondary | ICD-10-CM | POA: Diagnosis not present

## 2024-04-24 DIAGNOSIS — H3562 Retinal hemorrhage, left eye: Secondary | ICD-10-CM | POA: Diagnosis not present

## 2024-04-24 DIAGNOSIS — H353221 Exudative age-related macular degeneration, left eye, with active choroidal neovascularization: Secondary | ICD-10-CM | POA: Diagnosis not present

## 2024-04-26 DIAGNOSIS — H353132 Nonexudative age-related macular degeneration, bilateral, intermediate dry stage: Secondary | ICD-10-CM | POA: Diagnosis not present

## 2024-04-28 ENCOUNTER — Other Ambulatory Visit: Payer: Self-pay | Admitting: Cardiology

## 2024-05-01 ENCOUNTER — Other Ambulatory Visit: Payer: Self-pay | Admitting: Cardiovascular Disease

## 2024-05-03 ENCOUNTER — Ambulatory Visit: Payer: PPO

## 2024-05-03 DIAGNOSIS — I495 Sick sinus syndrome: Secondary | ICD-10-CM | POA: Diagnosis not present

## 2024-05-04 LAB — CUP PACEART REMOTE DEVICE CHECK
Battery Remaining Longevity: 132 mo
Battery Voltage: 3.01 V
Brady Statistic AP VP Percent: 0.12 %
Brady Statistic AP VS Percent: 50.09 %
Brady Statistic AS VP Percent: 0.06 %
Brady Statistic AS VS Percent: 49.73 %
Brady Statistic RA Percent Paced: 50.53 %
Brady Statistic RV Percent Paced: 0.18 %
Date Time Interrogation Session: 20251021200640
Implantable Lead Connection Status: 753985
Implantable Lead Connection Status: 753985
Implantable Lead Implant Date: 20221024
Implantable Lead Implant Date: 20221024
Implantable Lead Location: 753859
Implantable Lead Location: 753860
Implantable Lead Model: 5076
Implantable Lead Model: 5076
Implantable Pulse Generator Implant Date: 20221024
Lead Channel Impedance Value: 323 Ohm
Lead Channel Impedance Value: 323 Ohm
Lead Channel Impedance Value: 380 Ohm
Lead Channel Impedance Value: 418 Ohm
Lead Channel Pacing Threshold Amplitude: 0.375 V
Lead Channel Pacing Threshold Amplitude: 0.75 V
Lead Channel Pacing Threshold Pulse Width: 0.4 ms
Lead Channel Pacing Threshold Pulse Width: 0.4 ms
Lead Channel Sensing Intrinsic Amplitude: 0.5 mV
Lead Channel Sensing Intrinsic Amplitude: 0.5 mV
Lead Channel Sensing Intrinsic Amplitude: 5.25 mV
Lead Channel Sensing Intrinsic Amplitude: 5.25 mV
Lead Channel Setting Pacing Amplitude: 1.5 V
Lead Channel Setting Pacing Amplitude: 2 V
Lead Channel Setting Pacing Pulse Width: 0.4 ms
Lead Channel Setting Sensing Sensitivity: 0.9 mV
Zone Setting Status: 755011
Zone Setting Status: 755011

## 2024-05-05 NOTE — Progress Notes (Signed)
 Remote PPM Transmission

## 2024-05-08 ENCOUNTER — Ambulatory Visit: Payer: Self-pay | Admitting: Cardiology

## 2024-05-09 DIAGNOSIS — M5416 Radiculopathy, lumbar region: Secondary | ICD-10-CM | POA: Diagnosis not present

## 2024-05-09 DIAGNOSIS — M5442 Lumbago with sciatica, left side: Secondary | ICD-10-CM | POA: Diagnosis not present

## 2024-05-09 DIAGNOSIS — G8929 Other chronic pain: Secondary | ICD-10-CM | POA: Diagnosis not present

## 2024-05-11 ENCOUNTER — Ambulatory Visit: Admitting: Urology

## 2024-05-16 DIAGNOSIS — M25461 Effusion, right knee: Secondary | ICD-10-CM | POA: Diagnosis not present

## 2024-05-16 DIAGNOSIS — M1711 Unilateral primary osteoarthritis, right knee: Secondary | ICD-10-CM | POA: Diagnosis not present

## 2024-05-16 DIAGNOSIS — G8929 Other chronic pain: Secondary | ICD-10-CM | POA: Diagnosis not present

## 2024-05-19 ENCOUNTER — Ambulatory Visit: Admitting: Physician Assistant

## 2024-05-19 ENCOUNTER — Ambulatory Visit: Admitting: Urology

## 2024-05-30 DIAGNOSIS — H3562 Retinal hemorrhage, left eye: Secondary | ICD-10-CM | POA: Diagnosis not present

## 2024-05-30 DIAGNOSIS — H35373 Puckering of macula, bilateral: Secondary | ICD-10-CM | POA: Diagnosis not present

## 2024-05-30 DIAGNOSIS — H353113 Nonexudative age-related macular degeneration, right eye, advanced atrophic without subfoveal involvement: Secondary | ICD-10-CM | POA: Diagnosis not present

## 2024-05-30 DIAGNOSIS — H353124 Nonexudative age-related macular degeneration, left eye, advanced atrophic with subfoveal involvement: Secondary | ICD-10-CM | POA: Diagnosis not present

## 2024-05-30 DIAGNOSIS — H353221 Exudative age-related macular degeneration, left eye, with active choroidal neovascularization: Secondary | ICD-10-CM | POA: Diagnosis not present

## 2024-05-31 DIAGNOSIS — M25461 Effusion, right knee: Secondary | ICD-10-CM | POA: Diagnosis not present

## 2024-05-31 DIAGNOSIS — G8929 Other chronic pain: Secondary | ICD-10-CM | POA: Diagnosis not present

## 2024-05-31 DIAGNOSIS — M1711 Unilateral primary osteoarthritis, right knee: Secondary | ICD-10-CM | POA: Diagnosis not present

## 2024-06-05 ENCOUNTER — Other Ambulatory Visit: Payer: Self-pay | Admitting: Cardiovascular Disease

## 2024-06-05 DIAGNOSIS — I2510 Atherosclerotic heart disease of native coronary artery without angina pectoris: Secondary | ICD-10-CM

## 2024-06-05 DIAGNOSIS — E785 Hyperlipidemia, unspecified: Secondary | ICD-10-CM

## 2024-06-05 DIAGNOSIS — I10 Essential (primary) hypertension: Secondary | ICD-10-CM

## 2024-06-09 ENCOUNTER — Other Ambulatory Visit: Payer: Self-pay | Admitting: Cardiovascular Disease

## 2024-06-09 DIAGNOSIS — I2510 Atherosclerotic heart disease of native coronary artery without angina pectoris: Secondary | ICD-10-CM

## 2024-06-09 DIAGNOSIS — I480 Paroxysmal atrial fibrillation: Secondary | ICD-10-CM

## 2024-06-12 ENCOUNTER — Other Ambulatory Visit: Payer: Self-pay | Admitting: Cardiovascular Disease

## 2024-06-13 NOTE — Telephone Encounter (Signed)
Please advise if ok to refill historical medication. ? ?

## 2024-08-02 ENCOUNTER — Ambulatory Visit

## 2024-08-02 DIAGNOSIS — I495 Sick sinus syndrome: Secondary | ICD-10-CM

## 2024-08-03 LAB — CUP PACEART REMOTE DEVICE CHECK
Battery Remaining Longevity: 129 mo
Battery Voltage: 3.01 V
Brady Statistic AP VP Percent: 0.05 %
Brady Statistic AP VS Percent: 42.34 %
Brady Statistic AS VP Percent: 0.05 %
Brady Statistic AS VS Percent: 57.55 %
Brady Statistic RA Percent Paced: 42.67 %
Brady Statistic RV Percent Paced: 0.11 %
Date Time Interrogation Session: 20260121031013
Implantable Lead Connection Status: 753985
Implantable Lead Connection Status: 753985
Implantable Lead Implant Date: 20221024
Implantable Lead Implant Date: 20221024
Implantable Lead Location: 753859
Implantable Lead Location: 753860
Implantable Lead Model: 5076
Implantable Lead Model: 5076
Implantable Pulse Generator Implant Date: 20221024
Lead Channel Impedance Value: 304 Ohm
Lead Channel Impedance Value: 323 Ohm
Lead Channel Impedance Value: 380 Ohm
Lead Channel Impedance Value: 399 Ohm
Lead Channel Pacing Threshold Amplitude: 0.375 V
Lead Channel Pacing Threshold Amplitude: 0.625 V
Lead Channel Pacing Threshold Pulse Width: 0.4 ms
Lead Channel Pacing Threshold Pulse Width: 0.4 ms
Lead Channel Sensing Intrinsic Amplitude: 0.5 mV
Lead Channel Sensing Intrinsic Amplitude: 0.5 mV
Lead Channel Sensing Intrinsic Amplitude: 5.25 mV
Lead Channel Sensing Intrinsic Amplitude: 5.25 mV
Lead Channel Setting Pacing Amplitude: 1.5 V
Lead Channel Setting Pacing Amplitude: 2 V
Lead Channel Setting Pacing Pulse Width: 0.4 ms
Lead Channel Setting Sensing Sensitivity: 0.9 mV
Zone Setting Status: 755011
Zone Setting Status: 755011

## 2024-08-05 NOTE — Progress Notes (Signed)
 Remote PPM Transmission

## 2024-08-07 ENCOUNTER — Ambulatory Visit: Payer: Self-pay | Admitting: Cardiology

## 2024-09-13 ENCOUNTER — Ambulatory Visit: Admitting: Cardiology

## 2024-11-01 ENCOUNTER — Ambulatory Visit

## 2025-01-31 ENCOUNTER — Ambulatory Visit

## 2025-05-02 ENCOUNTER — Ambulatory Visit

## 2025-08-01 ENCOUNTER — Ambulatory Visit
